# Patient Record
Sex: Female | Born: 1960
Health system: Southern US, Community
[De-identification: ages and names within clinical notes are randomized; demographics above are authoritative.]

## PROBLEM LIST (undated history)

## (undated) DIAGNOSIS — R112 Nausea with vomiting, unspecified: Secondary | ICD-10-CM

## (undated) DIAGNOSIS — E785 Hyperlipidemia, unspecified: Secondary | ICD-10-CM

## (undated) DIAGNOSIS — G4700 Insomnia, unspecified: Secondary | ICD-10-CM

## (undated) DIAGNOSIS — D649 Anemia, unspecified: Secondary | ICD-10-CM

## (undated) DIAGNOSIS — T7840XA Allergy, unspecified, initial encounter: Secondary | ICD-10-CM

## (undated) DIAGNOSIS — M1990 Unspecified osteoarthritis, unspecified site: Secondary | ICD-10-CM

## (undated) DIAGNOSIS — I341 Nonrheumatic mitral (valve) prolapse: Secondary | ICD-10-CM

## (undated) DIAGNOSIS — Z9889 Other specified postprocedural states: Secondary | ICD-10-CM

## (undated) HISTORY — DX: Allergy, unspecified, initial encounter: T78.40XA

## (undated) HISTORY — PX: BREAST BIOPSY: SHX20

## (undated) HISTORY — DX: Insomnia, unspecified: G47.00

## (undated) HISTORY — DX: Hyperlipidemia, unspecified: E78.5

## (undated) HISTORY — DX: Unspecified osteoarthritis, unspecified site: M19.90

## (undated) HISTORY — PX: EYE SURGERY: SHX253

## (undated) HISTORY — PX: AUGMENTATION MAMMAPLASTY: SUR837

## (undated) HISTORY — PX: JOINT REPLACEMENT: SHX530

---

## 1986-02-19 HISTORY — PX: KNEE SURGERY: SHX244

## 2001-05-06 ENCOUNTER — Other Ambulatory Visit: Admission: RE | Admit: 2001-05-06 | Discharge: 2001-05-06 | Payer: Self-pay | Admitting: Family Medicine

## 2002-04-16 ENCOUNTER — Encounter: Admission: RE | Admit: 2002-04-16 | Discharge: 2002-04-16 | Payer: Self-pay | Admitting: Family Medicine

## 2002-04-16 ENCOUNTER — Encounter: Payer: Self-pay | Admitting: Family Medicine

## 2003-04-19 ENCOUNTER — Encounter: Admission: RE | Admit: 2003-04-19 | Discharge: 2003-04-19 | Payer: Self-pay | Admitting: Family Medicine

## 2003-05-19 ENCOUNTER — Other Ambulatory Visit: Admission: RE | Admit: 2003-05-19 | Discharge: 2003-05-19 | Payer: Self-pay | Admitting: Family Medicine

## 2004-05-17 ENCOUNTER — Encounter: Admission: RE | Admit: 2004-05-17 | Discharge: 2004-05-17 | Payer: Self-pay | Admitting: Family Medicine

## 2004-05-18 ENCOUNTER — Other Ambulatory Visit: Admission: RE | Admit: 2004-05-18 | Discharge: 2004-05-18 | Payer: Self-pay | Admitting: Family Medicine

## 2005-05-29 ENCOUNTER — Encounter: Admission: RE | Admit: 2005-05-29 | Discharge: 2005-05-29 | Payer: Self-pay | Admitting: Family Medicine

## 2005-08-29 ENCOUNTER — Other Ambulatory Visit: Admission: RE | Admit: 2005-08-29 | Discharge: 2005-08-29 | Payer: Self-pay | Admitting: Family Medicine

## 2006-07-25 ENCOUNTER — Encounter: Admission: RE | Admit: 2006-07-25 | Discharge: 2006-07-25 | Payer: Self-pay | Admitting: Family Medicine

## 2006-09-02 ENCOUNTER — Other Ambulatory Visit: Admission: RE | Admit: 2006-09-02 | Discharge: 2006-09-02 | Payer: Self-pay | Admitting: Family Medicine

## 2007-07-29 ENCOUNTER — Encounter: Admission: RE | Admit: 2007-07-29 | Discharge: 2007-07-29 | Payer: Self-pay | Admitting: Family Medicine

## 2007-08-06 ENCOUNTER — Encounter: Admission: RE | Admit: 2007-08-06 | Discharge: 2007-08-06 | Payer: Self-pay | Admitting: Family Medicine

## 2007-09-03 ENCOUNTER — Other Ambulatory Visit: Admission: RE | Admit: 2007-09-03 | Discharge: 2007-09-03 | Payer: Self-pay | Admitting: Family Medicine

## 2008-08-06 ENCOUNTER — Encounter: Admission: RE | Admit: 2008-08-06 | Discharge: 2008-08-06 | Payer: Self-pay | Admitting: Family Medicine

## 2008-09-15 ENCOUNTER — Other Ambulatory Visit: Admission: RE | Admit: 2008-09-15 | Discharge: 2008-09-15 | Payer: Self-pay | Admitting: Family Medicine

## 2008-12-16 ENCOUNTER — Encounter: Payer: Self-pay | Admitting: Cardiovascular Disease

## 2008-12-17 ENCOUNTER — Encounter: Payer: Self-pay | Admitting: Cardiovascular Disease

## 2009-01-03 DIAGNOSIS — R030 Elevated blood-pressure reading, without diagnosis of hypertension: Secondary | ICD-10-CM | POA: Insufficient documentation

## 2009-01-03 DIAGNOSIS — B9789 Other viral agents as the cause of diseases classified elsewhere: Secondary | ICD-10-CM | POA: Insufficient documentation

## 2009-01-03 DIAGNOSIS — E7800 Pure hypercholesterolemia, unspecified: Secondary | ICD-10-CM | POA: Insufficient documentation

## 2009-01-03 DIAGNOSIS — G43909 Migraine, unspecified, not intractable, without status migrainosus: Secondary | ICD-10-CM | POA: Insufficient documentation

## 2009-01-03 DIAGNOSIS — R079 Chest pain, unspecified: Secondary | ICD-10-CM | POA: Insufficient documentation

## 2009-01-12 ENCOUNTER — Ambulatory Visit: Payer: Self-pay | Admitting: Cardiovascular Disease

## 2009-01-12 DIAGNOSIS — I10 Essential (primary) hypertension: Secondary | ICD-10-CM | POA: Insufficient documentation

## 2009-02-17 ENCOUNTER — Telehealth (INDEPENDENT_AMBULATORY_CARE_PROVIDER_SITE_OTHER): Payer: Self-pay | Admitting: *Deleted

## 2009-02-21 ENCOUNTER — Ambulatory Visit: Payer: Self-pay | Admitting: Cardiology

## 2009-02-21 ENCOUNTER — Ambulatory Visit: Payer: Self-pay

## 2009-02-21 ENCOUNTER — Ambulatory Visit (HOSPITAL_COMMUNITY): Admission: RE | Admit: 2009-02-21 | Discharge: 2009-02-21 | Payer: Self-pay | Admitting: Cardiovascular Disease

## 2009-02-21 ENCOUNTER — Encounter (HOSPITAL_COMMUNITY): Admission: RE | Admit: 2009-02-21 | Discharge: 2009-04-25 | Payer: Self-pay | Admitting: Cardiovascular Disease

## 2009-02-21 ENCOUNTER — Encounter: Payer: Self-pay | Admitting: Cardiovascular Disease

## 2009-03-03 ENCOUNTER — Ambulatory Visit: Payer: Self-pay | Admitting: Cardiovascular Disease

## 2009-03-03 DIAGNOSIS — I341 Nonrheumatic mitral (valve) prolapse: Secondary | ICD-10-CM | POA: Insufficient documentation

## 2009-03-03 DIAGNOSIS — I059 Rheumatic mitral valve disease, unspecified: Secondary | ICD-10-CM | POA: Insufficient documentation

## 2009-08-08 ENCOUNTER — Encounter: Admission: RE | Admit: 2009-08-08 | Discharge: 2009-08-08 | Payer: Self-pay | Admitting: Family Medicine

## 2009-10-17 ENCOUNTER — Other Ambulatory Visit: Admission: RE | Admit: 2009-10-17 | Discharge: 2009-10-17 | Payer: Self-pay | Admitting: Family Medicine

## 2010-02-28 ENCOUNTER — Encounter: Payer: Self-pay | Admitting: Cardiovascular Disease

## 2010-02-28 ENCOUNTER — Ambulatory Visit (HOSPITAL_COMMUNITY)
Admission: RE | Admit: 2010-02-28 | Discharge: 2010-02-28 | Payer: Self-pay | Source: Home / Self Care | Attending: Cardiovascular Disease | Admitting: Cardiovascular Disease

## 2010-02-28 ENCOUNTER — Ambulatory Visit
Admission: RE | Admit: 2010-02-28 | Discharge: 2010-02-28 | Payer: Self-pay | Source: Home / Self Care | Attending: Cardiovascular Disease | Admitting: Cardiovascular Disease

## 2010-03-07 ENCOUNTER — Telehealth: Payer: Self-pay | Admitting: Cardiovascular Disease

## 2010-03-09 ENCOUNTER — Ambulatory Visit
Admission: RE | Admit: 2010-03-09 | Discharge: 2010-03-09 | Payer: Self-pay | Source: Home / Self Care | Attending: Cardiovascular Disease | Admitting: Cardiovascular Disease

## 2010-03-09 ENCOUNTER — Encounter: Payer: Self-pay | Admitting: Cardiovascular Disease

## 2010-03-09 DIAGNOSIS — R0602 Shortness of breath: Secondary | ICD-10-CM | POA: Insufficient documentation

## 2010-03-23 NOTE — Assessment & Plan Note (Signed)
Summary: 1 yr follow up/424.0, MVP, MR/pla   Visit Type:  1 year follow up Primary Provider:  Dr Merri Brunette  CC:  shortness of breath and .  History of Present Illness: 50 yo WF with history of migraine headaches, hyperlipidemia, HTN and strong family history of CAD who is here today for follow up. I saw her one year ago  for evaluation of chest pressure. The pain was described as  substernal and occurred  at rest and with exertion.   There was no associated SOB, palpitations, diaphoresis, nausea or vomiting. She has had no pre-syncope or syncope. She does have a history of migraine headaches and there is no association of her headaches with the chest pressure or with the elevated blood pressure.  No association of chest pain with meals. She has no history of GERD.  Her echo showed normal LV function with mild MR. Her stress test showed no ischemia.   She is here today for follow up. She has continued to have SOB when climbing stairs. No chest pain. NO awareness of palpitations. No near syncope or syncope. Repeat echo last week with normal LV size and function. Mitral valve prolapse with mild to moderate regurgitation. Normal LA size.   Current Medications (verified): 1)  Voltaren 75mg  .... 1 Tab As Needed 2)  Aspirin 81 Mg Tbec (Aspirin) .... Take One Tablet By Mouth Daily 3)  Zyrtec Allergy 10 Mg Tabs (Cetirizine Hcl) .Marland Kitchen.. 1 Tab Once Daily As Needed 4)  Flonase 50 Mcg/act Susp (Fluticasone Propionate) .... As Needed 5)  Yasmin 28 3-0.03 Mg Tabs (Drospirenone-Ethinyl Estradiol) .... Take As Directed 6)  Imipramine Hcl 10 Mg Tabs (Imipramine Hcl) .Marland Kitchen.. 1 1/2 Tab Once Daily 7)  Zomig 5 Mg Tabs (Zolmitriptan) .Marland Kitchen.. 1 Tab As Needed For Migraines 8)  Lipitor 20 Mg Tabs (Atorvastatin Calcium) .Marland Kitchen.. 1 Tab Once Daily 9)  Transderm-Scop 1.5 Mg Pt72 (Scopolamine Base) .... Change Patch Every 72 Hours( Travel Only) 10)  Hydrochlorothiazide 25 Mg Tabs (Hydrochlorothiazide) .Marland Kitchen.. 1 Tablet By Mouth Once  Daily  Allergies (verified): 1)  ! Codeine  Past History:  Past Medical History: CHEST PAIN UNSPECIFIED (ICD-786.50) ELEVATED BLOOD PRESSURE (ICD-796.2) PURE HYPERCHOLESTEROLEMIA (ICD-272.0) MIGRAINE HEADACHE (ICD-346.90) MItral valve prolapse with mild to moderate MR      Social History: Reviewed history from 01/12/2009 and no changes required. Married, 1 child Tobacco Use - No.  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  The patient denies fatigue, malaise, fever, weight gain/loss, vision loss, decreased hearing, hoarseness, chest pain, palpitations, prolonged cough, wheezing, sleep apnea, coughing up blood, abdominal pain, blood in stool, nausea, vomiting, diarrhea, heartburn, incontinence, blood in urine, muscle weakness, joint pain, leg swelling, rash, skin lesions, headache, fainting, dizziness, depression, anxiety, enlarged lymph nodes, easy bruising or bleeding, and environmental allergies.    Vital Signs:  Patient profile:   50 year old female Height:      70 inches Weight:      162 pounds BMI:     23.33 Pulse rate:   88 / minute BP sitting:   108 / 70  Vitals Entered By: Caralee Ates CMA (March 09, 2010 12:18 PM)  Physical Exam  General:  General: Well developed, well nourished, NAD Psychiatric: Mood and affect normal Neck: No JVD, no carotid bruits, no thyromegaly, no lymphadenopathy. Lungs:Clear bilaterally,  no wheezes, rhonci, crackles CV: RRR no loud murmurs, gallops rubs Abdomen: soft, NT, ND, BS present Extremities: No edema, pulses 2+.    EKG  Procedure date:  03/09/2010  Findings:      NSR, rate 81 bpm.   Echocardiogram  Procedure date:  02/28/2010  Findings:      - Left ventricle: The cavity size was normal. Wall thickness was       normal. Systolic function was normal. The estimated ejection       fraction  was in the range of 60% to 65%. Wall motion was normal;       there were no regional wall motion abnormalities.     - Mitral valve: Prolapse. Mild , involving the posterior leaflet.       Mild to moderate regurgitation.     - Atrial septum: No defect or patent foramen ovale was identified.  Impression & Recommendations:  Problem # 1:  MITRAL VALVE PROLAPSE, NON-RHEUMATIC (ICD-424.0) Mild to moderate MR. I do not think this is related to her SOB. Repeat echo 2 years.   Her updated medication list for this problem includes:    Hydrochlorothiazide 25 Mg Tabs (Hydrochlorothiazide) .Marland Kitchen... 1 tablet by mouth once daily  Problem # 2:  DYSPNEA (ICD-786.05) Likely non-cardiac. WE discussed PFTs and CXR if worsens. Lung exam normal. She will let us know if her clinical status changes.   Her updated medication list for this problem includes:    Aspirin 81 Mg Tbec (Aspirin) .Marland Kitchen... Take one tablet by mouth daily    Hydrochlorothiazide 25 Mg Tabs (Hydrochlorothiazide) .Marland Kitchen... 1 tablet by mouth once daily  Patient Instructions: 1)  Your physician recommends that you schedule a follow-up appointment in: 2 years with an echocardiogram.

## 2010-03-23 NOTE — Assessment & Plan Note (Signed)
Summary: per check out/ f/u to test/sf   Visit Type:  Follow-up Primary Provider:  Dr Merri Brunette  CC:  pt states she has some of the chest pressure/discomfort.Marland Kitchenotherwise no other complaints today.  History of Present Illness: 50 yo WF with history of migraine headaches, hyperlipidemia, HTN and strong family history of CAD who is here today for follow up. I saw her last month for evaluation of chest pressure. The pain was described as  substernal and occurs at rest and with exertion. The pain does not radiate. There is no associated SOB, palpitations, diaphoresis, nausea or vomiting. She has had no pre-syncope or syncope. She does have a history of migraine headaches and there is no association of her headaches with the chest pressure or with the elevated blood pressure.  No association of chest pain with meals. She has no history of GERD. Overall, her energy level has been less with workouts and she feels fatigued after minimal exercise. No palpitations or awareness of irregularity of her heart rhythm.   Her echo showed normal LV function with mild MR. Her stress test showed no ischemia. She has had several episodes of mild chest discomfort. No other complaints.   Current Medications (verified): 1)  Voltaren 75mg  .... 1 Tab As Needed 2)  Aspirin 81 Mg Tbec (Aspirin) .... Take One Tablet By Mouth Daily 3)  Zyrtec Allergy 10 Mg Tabs (Cetirizine Hcl) .Marland Kitchen.. 1 Tab Once Daily As Needed 4)  Flonase 50 Mcg/act Susp (Fluticasone Propionate) .... As Needed 5)  Yasmin 28 3-0.03 Mg Tabs (Drospirenone-Ethinyl Estradiol) .... Take As Directed 6)  Imipramine Hcl 10 Mg Tabs (Imipramine Hcl) .Marland Kitchen.. 1 1/2 Tab Once Daily 7)  Zomig 5 Mg Tabs (Zolmitriptan) .Marland Kitchen.. 1 Tab As Needed For Migraines 8)  Lipitor 20 Mg Tabs (Atorvastatin Calcium) .Marland Kitchen.. 1 Tab Once Daily 9)  Transderm-Scop 1.5 Mg Pt72 (Scopolamine Base) .... Change Patch Every 72 Hours( Travel Only)  Allergies: 1)  ! Codeine  Past History:  Past Medical  History: Reviewed history from 01/12/2009 and no changes required. CHEST PAIN UNSPECIFIED (ICD-786.50) ELEVATED BLOOD PRESSURE (ICD-796.2) PURE HYPERCHOLESTEROLEMIA (ICD-272.0) MIGRAINE HEADACHE (ICD-346.90)      Social History: Reviewed history from 01/12/2009 and no changes required. Married, 1 children Tobacco Use - No.  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  The patient denies fatigue, malaise, fever, weight gain/loss, vision loss, decreased hearing, hoarseness, palpitations, shortness of breath, prolonged cough, wheezing, sleep apnea, coughing up blood, abdominal pain, blood in stool, nausea, vomiting, diarrhea, heartburn, incontinence, blood in urine, muscle weakness, joint pain, leg swelling, rash, skin lesions, headache, fainting, dizziness, depression, anxiety, enlarged lymph nodes, easy bruising or bleeding, and environmental allergies.    Vital Signs:  Patient profile:   50 year old female Height:      70 inches Weight:      166 pounds BMI:     23.90 Pulse rate:   72 / minute Pulse rhythm:   regular BP sitting:   102 / 80  (left arm) Cuff size:   large  Vitals Entered By: Danielle Rankin, CMA (March 03, 2009 11:53 AM)  Physical Exam  General:  General: Well developed, well nourished, NAD Psychiatric: Mood and affect normal Neck: No JVD, no carotid bruits, no thyromegaly, no lymphadenopathy. Lungs:Clear bilaterally, no wheezes, rhonci, crackles CV: RRR no murmurs, gallops  rubs Abdomen: soft, NT, ND, BS present Extremities: Pulses 2+.    Echocardiogram  Procedure date:  02/21/2009  Findings:      - Left ventricle: The cavity size was normal. Wall thickness was       normal. Systolic function was normal. The estimated ejection       fraction was in the range of 55% to 60%. Wall motion was normal;       there were no regional wall motion  abnormalities.     - Mitral valve: Mild prolapse. Mild regurgitation.  Nuclear ETT  Procedure date:  02/21/2009  Findings:      Stress Procedure   The patient exercised for 11:05.  The patient stopped due to fatigue and denied any chest pain.  There were no diagnostic ST-T wave changes, only nonspecific changes.  There was one 3-beat episode of v-tach in recovery.  Myoview was injected at peak exercise and myocardial perfusion imaging was performed after a brief delay.  Quantitative Gated Spect Images  QGS EDV:  84 ml QGS ESV:  31 ml QGS EF:  63 % QGS cine images:  Normal wall motion.  Overall Impression   Exercise Capacity: Good exercise capacity. BP Response: Normal blood pressure response. Clinical Symptoms: No chest pain ECG Impression: Insignificant upsloping ST segment depression; one 3 beat run of NSVT. Overall Impression: There is no sign of scar or ischemia.   Impression & Recommendations:  Problem # 1:  CHEST TIGHTNESS-PRESSURE-OTHER ([id number]) Non-cardiac chest pain. No further cardiac workup at this time.   Problem # 2:  MITRAL VALVE PROLAPSE, NON-RHEUMATIC (ICD-424.0) Mild MR. Repeat echo one year. No indication for dental antibiotic prophylaxis per current guidelines.   Problem # 3:  HYPERTENSION, BENIGN (ICD-401.1) We have discussed initiating therapy. She would like to discuss this with Dr. Katrinka Blazing. I would recommend starting HCTZ or Norvasc.   Her updated medication list for this problem includes:    Aspirin 81 Mg Tbec (Aspirin) .Marland Kitchen... Take one tablet by mouth daily  Patient Instructions: 1)  Your physician recommends that you schedule a follow-up appointment in: 12 months 2)  Your physician has requested that you have an echocardiogram.  Echocardiography is a painless test that uses sound waves to create images of your heart. It provides your doctor with information about the size and shape of your heart and how well your heart's chambers and valves are  working.  This procedure takes approximately one hour. There are no restrictions for this procedure. To be done in 12 months

## 2010-03-23 NOTE — Letter (Signed)
[facility]oraha [name]   Imported By: Kassie Mends 03/07/2009 13:48:39  _____________________________________________________________________  External Attachment:    Type:   Image     Comment:   External Document

## 2010-03-23 NOTE — Assessment & Plan Note (Signed)
Summary: Cardiology Nuclear Study  Nuclear Med Background Indications for Stress Test: Evaluation for Ischemia    History Comments: NO DOCUMENTED CAD  Symptoms: Chest Pressure, Chest Pressure with Exertion, Fatigue with Exertion  Symptoms Comments: Last episode of CP:3 weeks ago.   Nuclear Pre-Procedure Cardiac Risk Factors: Family History - CAD, Hypertension, Lipids Caffeine/Decaff Intake: None NPO After: 12:00 AM Lungs: Clear IV 0.9% NS with Angio Cath: 22g     IV Site: (R) AC IV Started by: Irean Hong RN Chest Size (in) 38     Cup Size A     Height (in): 70 Weight (lb): 158 BMI: 22.75 Tech Comments: Baseline HTN: Sitting 139/93, Standing 121/94.  Nuclear Med Study 1 or 2 day study:  1 day     Stress Test Type:  Stress Reading MD:  Olga Millers, MD     Referring MD:  Verne Carrow, MD; VH:QIONGEX Katrinka Blazing, MD Resting Radionuclide:  Technetium 67m Tetrofosmin     Resting Radionuclide Dose:  11.0 mCi  Stress Radionuclide:  Technetium 22m Tetrofosmin     Stress Radionuclide Dose:  33.0 mCi   Stress Protocol Exercise Time (min):  11:05 min     Max HR:  164 bpm     Predicted Max HR:  173 bpm  Max Systolic BP: 147 mm Hg     Percent Max HR:  94.80 %     METS: 13.4 Rate Pressure Product:  52841    Stress Test Technologist:  [employer] CMA-N     Nuclear Technologist:  Harlow Asa CNMT  Rest Procedure  Myocardial perfusion imaging was performed at rest 45 minutes following the intravenous administration of Myoview Technetium 7m Tetrofosmin.  Stress Procedure  The patient exercised for 11:05.  The patient stopped due to fatigue and denied any chest pain.  There were no diagnostic ST-T wave changes, only nonspecific changes.  There was one 3-beat episode of v-tach in recovery.  Myoview was injected at peak exercise and myocardial perfusion imaging was performed after a brief delay.  QPS Raw Data Images:  Acuisition technically good; normal left ventricular  size. Stress Images:  There is normal uptake in all areas. Rest Images:  Normal homogeneous uptake in all areas of the myocardium. Subtraction (SDS):  No evidence of ischemia. Transient Ischemic Dilatation:  .97  (Normal <1.22)  Lung/Heart Ratio:  .28  (Normal <0.45)  Quantitative Gated Spect Images QGS EDV:  84 ml QGS ESV:  31 ml QGS EF:  63 % QGS cine images:  Normal wall motion.   Overall Impression  Exercise Capacity: Good exercise capacity. BP Response: Normal blood pressure response. Clinical Symptoms: No chest pain ECG Impression: Insignificant upsloping ST segment depression; one 3 beat run of NSVT. Overall Impression: There is no sign of scar or ischemia.  Appended Document: Cardiology Nuclear Study Normal. No ischemia. Please see echo results too. cdm  Appended Document: Cardiology Nuclear Study Dr. Clifton James reviewed with pt. at office visit on 03/03/09

## 2010-03-23 NOTE — Progress Notes (Signed)
Summary: pt rtn call  Phone Note Call from Patient Call back at Home Phone [phone]   Caller: Patient Reason for Call: Talk to Nurse, Talk to Doctor Summary of Call: pt rtn call to [facility] Initial call taken by: Omer Jack,  March 07, 2010 4:44 PM  Follow-up for Phone Call        patient aware of test results. Whitney Maeola Sarah RN  March 07, 2010 5:10 PM  Follow-up by: Whitney Maeola Sarah RN,  March 07, 2010 5:10 PM

## 2010-06-20 ENCOUNTER — Ambulatory Visit (INDEPENDENT_AMBULATORY_CARE_PROVIDER_SITE_OTHER): Payer: 59 | Admitting: Family Medicine

## 2010-06-20 ENCOUNTER — Encounter: Payer: Self-pay | Admitting: Family Medicine

## 2010-06-20 DIAGNOSIS — M25819 Other specified joint disorders, unspecified shoulder: Secondary | ICD-10-CM

## 2010-06-20 DIAGNOSIS — M67919 Unspecified disorder of synovium and tendon, unspecified shoulder: Secondary | ICD-10-CM

## 2010-06-20 DIAGNOSIS — M7541 Impingement syndrome of right shoulder: Secondary | ICD-10-CM

## 2010-06-20 DIAGNOSIS — M7550 Bursitis of unspecified shoulder: Secondary | ICD-10-CM

## 2010-06-20 DIAGNOSIS — M7580 Other shoulder lesions, unspecified shoulder: Secondary | ICD-10-CM

## 2010-06-20 DIAGNOSIS — IMO0002 Reserved for concepts with insufficient information to code with codable children: Secondary | ICD-10-CM

## 2010-06-20 DIAGNOSIS — M75100 Unspecified rotator cuff tear or rupture of unspecified shoulder, not specified as traumatic: Secondary | ICD-10-CM

## 2010-06-20 DIAGNOSIS — M719 Bursopathy, unspecified: Secondary | ICD-10-CM

## 2010-06-20 NOTE — Progress Notes (Signed)
The patient noted above presents with shoulder pain that has been ongoing for 2 years. there is no history of trauma or accident recently The patient denies neck pain or radicular symptoms. Denies dislocation, subluxation, separation of the shoulder. The patient does complain of pain in the overhead plane with significant painful arc of motion. And pain with IROM.  Medications Tried: Tylenol, NSAIDS Ice or Heat: minimally helpful Tried PT: No  Prior shoulder Injury: No Prior surgery: No Prior fracture: No  The PMH, PSH, Social History, Family History, Medications, and allergies have been reviewed in [facility], and have been updated if relevant.  REVIEW OF SYSTEMS  GEN: No fevers, chills. Nontoxic. Primarily MSK c/o today. MSK: Detailed in the HPI GI: tolerating PO intake without difficulty Neuro: No numbness, parasthesias, or tingling associated. Otherwise the pertinent positives of the ROS are noted above.   GEN: Well-developed,well-nourished,in no acute distress; alert,appropriate and cooperative throughout examination HEENT: Normocephalic and atraumatic without obvious abnormalities. Ears, externally no deformities PULM: Breathing comfortably in no respiratory distress EXT: No clubbing, cyanosis, or edema PSYCH: Normally interactive. Cooperative during the interview. Pleasant. Friendly and conversant. Not anxious or depressed appearing. Normal, full affect.  Shoulder: R Inspection: No muscle wasting or winging Ecchymosis/edema: neg  AC joint, scapula, clavicle: NT Cervical spine: NT, full ROM Spurling's: neg Abduction: full, 4+/5 - PAINFUL ARC OF MOTION Flexion: full, 4+/5 IR, full, lift-off: 4/5 - LOSS OF 40 DEG COMPARED TO CONTRALATERAL ER at neutral: full, 4+/5 - LOSS OF 10 DEG AC crossover: neg Neer: pos Hawkins: pos Drop Test: neg Empty Can: pos Supraspinatus insertion: mild-mod T Bicipital groove: NT Speed's: pos Yergason's: pos Sulcus sign: neg Scapular  dyskinesis: none C5-T1 intact  Neuro: Sensation intact Grip 5/5    1. Rotator Cuff tendinopathy: subacromial impingement with subcoracoid impingement, subac bursitis, bicipital tendonitis. Beginings of a frozen shoulder. >30 minutes spent in face to face time with patient, >50% spent in counselling or coordination of care  Rotator cuff strengthening and scapular stabilization exercises were reviewed with the patient.  [facility] RTC and scapular stabilization program given to the patient. Retraining shoulder mechanics and function was emphasized to the patient with rehab done at least 5-6 days a week.   formal PT to assist with scapular stabilization and RTC strengthening.  Suggested subac inj, but declined.

## 2010-08-29 ENCOUNTER — Other Ambulatory Visit: Payer: Self-pay | Admitting: *Deleted

## 2010-08-29 ENCOUNTER — Other Ambulatory Visit: Payer: Self-pay | Admitting: Family Medicine

## 2010-08-29 DIAGNOSIS — Z1231 Encounter for screening mammogram for malignant neoplasm of breast: Secondary | ICD-10-CM

## 2010-08-29 DIAGNOSIS — M7541 Impingement syndrome of right shoulder: Secondary | ICD-10-CM

## 2010-08-29 NOTE — Progress Notes (Signed)
Order faxed to PT on [facility] per patient's request.

## 2010-09-13 ENCOUNTER — Ambulatory Visit: Payer: 59

## 2010-09-19 ENCOUNTER — Ambulatory Visit
Admission: RE | Admit: 2010-09-19 | Discharge: 2010-09-19 | Disposition: A | Discharge status: Home / Self Care | Payer: 59 | Source: Ambulatory Visit | Attending: Family Medicine | Admitting: Family Medicine

## 2010-09-19 DIAGNOSIS — Z1231 Encounter for screening mammogram for malignant neoplasm of breast: Secondary | ICD-10-CM

## 2010-09-25 ENCOUNTER — Ambulatory Visit: Payer: 59 | Attending: Family Medicine | Admitting: Rehabilitative and Restorative Service Providers"

## 2010-09-25 DIAGNOSIS — IMO0001 Reserved for inherently not codable concepts without codable children: Secondary | ICD-10-CM | POA: Insufficient documentation

## 2010-09-25 DIAGNOSIS — M25519 Pain in unspecified shoulder: Secondary | ICD-10-CM | POA: Insufficient documentation

## 2010-09-25 DIAGNOSIS — M2569 Stiffness of other specified joint, not elsewhere classified: Secondary | ICD-10-CM | POA: Insufficient documentation

## 2010-09-27 ENCOUNTER — Ambulatory Visit: Payer: 59 | Admitting: Rehabilitative and Restorative Service Providers"

## 2010-10-02 ENCOUNTER — Ambulatory Visit: Payer: 59 | Admitting: Physical Therapy

## 2010-10-04 ENCOUNTER — Ambulatory Visit: Payer: 59 | Admitting: Physical Therapy

## 2010-10-09 ENCOUNTER — Encounter: Payer: 59 | Admitting: Rehabilitative and Restorative Service Providers"

## 2010-10-10 ENCOUNTER — Ambulatory Visit: Payer: 59 | Admitting: Physical Therapy

## 2010-10-11 ENCOUNTER — Ambulatory Visit: Payer: 59 | Admitting: Physical Therapy

## 2010-10-11 ENCOUNTER — Encounter: Payer: 59 | Admitting: Rehabilitative and Restorative Service Providers"

## 2010-10-12 ENCOUNTER — Encounter: Payer: 59 | Admitting: Physical Therapy

## 2010-10-16 ENCOUNTER — Ambulatory Visit: Payer: 59 | Admitting: Physical Therapy

## 2010-10-19 ENCOUNTER — Encounter: Payer: 59 | Admitting: Physical Therapy

## 2010-10-25 ENCOUNTER — Ambulatory Visit: Payer: 59 | Attending: Family Medicine | Admitting: Rehabilitative and Restorative Service Providers"

## 2010-10-25 DIAGNOSIS — M2569 Stiffness of other specified joint, not elsewhere classified: Secondary | ICD-10-CM | POA: Insufficient documentation

## 2010-10-25 DIAGNOSIS — M25519 Pain in unspecified shoulder: Secondary | ICD-10-CM | POA: Insufficient documentation

## 2010-10-25 DIAGNOSIS — IMO0001 Reserved for inherently not codable concepts without codable children: Secondary | ICD-10-CM | POA: Insufficient documentation

## 2010-10-31 ENCOUNTER — Ambulatory Visit: Payer: 59 | Admitting: Physical Therapy

## 2010-11-07 ENCOUNTER — Ambulatory Visit: Payer: 59 | Admitting: Physical Therapy

## 2010-11-14 ENCOUNTER — Encounter: Payer: 59 | Admitting: Rehabilitative and Restorative Service Providers"

## 2010-11-22 ENCOUNTER — Encounter: Payer: 59 | Admitting: Rehabilitative and Restorative Service Providers"

## 2011-01-04 ENCOUNTER — Ambulatory Visit (INDEPENDENT_AMBULATORY_CARE_PROVIDER_SITE_OTHER): Payer: 59 | Admitting: Family Medicine

## 2011-01-04 DIAGNOSIS — M7580 Other shoulder lesions, unspecified shoulder: Secondary | ICD-10-CM

## 2011-01-04 DIAGNOSIS — M75100 Unspecified rotator cuff tear or rupture of unspecified shoulder, not specified as traumatic: Secondary | ICD-10-CM

## 2011-01-04 DIAGNOSIS — M25819 Other specified joint disorders, unspecified shoulder: Secondary | ICD-10-CM

## 2011-01-04 DIAGNOSIS — M7541 Impingement syndrome of right shoulder: Secondary | ICD-10-CM

## 2011-01-04 DIAGNOSIS — M7550 Bursitis of unspecified shoulder: Secondary | ICD-10-CM

## 2011-01-04 DIAGNOSIS — IMO0002 Reserved for concepts with insufficient information to code with codable children: Secondary | ICD-10-CM

## 2011-01-04 DIAGNOSIS — M67919 Unspecified disorder of synovium and tendon, unspecified shoulder: Secondary | ICD-10-CM

## 2011-01-05 ENCOUNTER — Encounter: Payer: Self-pay | Admitting: Family Medicine

## 2011-01-05 NOTE — Progress Notes (Signed)
Patient Name: Tara [name] Date of Birth: Nov 01, 1960 Age: 50 y.o. Medical Record Number: [id number] Gender: female  History of Present Illness:  Tara [name] is a 50 y.o. very pleasant female patient who presents with the following:  Very pleasant lady seen in followup for RIGHT shoulder impingement, and weakness as well as restriction of motion that I saw about 6 months ago. She would've formal physical therapy, and has been quite diligent on her home exercise program working on her rotator cuff strengthening and scapular stabilization about 5 days a week. She is functionally significantly better, range of motion is now normal, and her strength is improved to where it is normal on the affected side compared to the contralateral side. She will let some occasional rare impingement when she is lifting up something with flexion  Hi, or abduction high. Occasionally she will have a twinge and had some bad pain all of a sudden, but globally she is significantly better. No instability.  Past Medical History, Surgical History, Social History, Family History, and Problem List have been reviewed in EHR and updated if relevant.  Review of Systems:  GEN: No fevers, chills. Nontoxic. Primarily MSK c/o today. MSK: Detailed in the HPI GI: tolerating PO intake without difficulty Neuro: No numbness, parasthesias, or tingling associated. Otherwise the pertinent positives of the ROS are noted above.    Physical Examination: Filed Vitals:   01/04/11 1503  BP: 128/87     GEN: WDWN, NAD, Non-toxic, Alert & Oriented x 3 HEENT: Atraumatic, Normocephalic.  Ears and Nose: No external deformity. EXTR: No clubbing/cyanosis/edema NEURO: Normal gait.  PSYCH: Normally interactive. Conversant. Not depressed or anxious appearing.  Calm demeanor.   Shoulder: R Inspection: No muscle wasting or winging Ecchymosis/edema: neg  AC joint, scapula, clavicle: NT Cervical spine: NT, full ROM Spurling's:  neg Abduction: full, 5/5 Flexion: full, 5/5 IR, full, lift-off: 5/5 ER at neutral: full, 5/5 AC crossover and compression: neg Neer: neg Hawkins: neg Drop Test: neg Empty Can: neg Supraspinatus insertion: NT Bicipital groove: NT Speed's: neg Yergason's: neg Sulcus sign: neg Scapular dyskinesis: none Crank neg obriens neg Clunk neg Apprehension neg jobe relocation negative C5-T1 intact Sensation intact Grip 5/5  Diagnostic Ultrasound Evaluation [employer] E, MSK ultrasound, MSK probe Anatomy scanned: Right shoulder Indication: Pain Findings: bbicipital tendon appears normal in its groove without significant fluid, and there is no evidence for tearing. Subscapularis does not impinge on the subcoracoid area, but there is evidence of a hyperechoic calcification in the subscapularis consistent with a prior care and healing.  There appears to be no gapping or hypoechoic area currently. Supraspinatus is intact. There is one small area of hyperechoic calcifications seen very minimally distally in the tendon. There is no fluid in the acromioclavicular joint, and there is no significant osteoarthritis visualized. Minimal impingement of the supraspinatus in the subacromial space. No significant subacromial bursitis. Infraspinatus is intact.   Assessment and Plan: 1. Impingement syndrome of right shoulder   2. Subacromial bursitis   3. Rotator cuff tendonitis     >25 minutes spent in face to face time with patient, >50% spent in counselling or coordination of care  The patient is dramatically improved compared to my prior examination. Is very reassuring that her ultrasound appears normal. I suspect that she probably earlier in the summer did have a subscapularis partial tear, but that is healed now, and would correspond to her relative weakness and internal range of motion at her last exam. Functionally, she  appears quite good, and will have some rare impingement. Theoretically  it is possible that she could have a small slap lesion or some other  Labral pathology. I discussed all this with her, and since she is doing quite well and has 5 out of 5 strength and normal motion, we decided that the best course of action is for her to slightly modify her activities if she is having some symptoms, slightly modify her weightlifting routines if needed, but overall I tried to reassure her that her shoulder appears dramatically better compared to my initial evaluation.

## 2011-09-27 ENCOUNTER — Ambulatory Visit: Payer: 59 | Admitting: Sports Medicine

## 2011-10-03 ENCOUNTER — Encounter: Payer: Self-pay | Admitting: Family Medicine

## 2011-10-03 ENCOUNTER — Ambulatory Visit (INDEPENDENT_AMBULATORY_CARE_PROVIDER_SITE_OTHER): Payer: 59 | Admitting: Family Medicine

## 2011-10-03 VITALS — BP 125/89 | HR 63 | Ht 70.0 in | Wt 155.0 lb

## 2011-10-03 DIAGNOSIS — M25519 Pain in unspecified shoulder: Secondary | ICD-10-CM

## 2011-10-03 DIAGNOSIS — M25819 Other specified joint disorders, unspecified shoulder: Secondary | ICD-10-CM

## 2011-10-03 DIAGNOSIS — M7541 Impingement syndrome of right shoulder: Secondary | ICD-10-CM

## 2011-10-03 DIAGNOSIS — M25511 Pain in right shoulder: Secondary | ICD-10-CM

## 2011-10-03 MED ORDER — NITROGLYCERIN 0.2 MG/HR TD PT24: MEDICATED_PATCH | TRANSDERMAL | Status: DC

## 2011-10-03 NOTE — Patient Instructions (Addendum)
You have rotator cuff impingement Try to avoid painful activities (overhead activities, lifting with extended arm) as much as possible. Aleve and/or tylenol as needed for pain. Start nitro patches - place 1/4th of a patch over affected area and change every day. Subacromial injection may be beneficial to help with pain and to decrease inflammation if still not improving. Continue home exercises most days of the week. Follow up with me in 6 weeks. If not improving would consider cortisone injection, increased nitro dose.

## 2011-10-05 ENCOUNTER — Encounter: Payer: Self-pay | Admitting: Family Medicine

## 2011-10-05 NOTE — Progress Notes (Signed)
  Subjective:    Patient ID: Tara [name], female    DOB: 1960-05-07, 51 y.o.   MRN: [id number]  PCP: Dr. Katrinka Blazing  HPI 51 yo F here for right shoulder pain.  Patient was seen in [place] office most recently in November for right rotator cuff impingement. She has done formal PT and been diligent about her home exercises. Continues to struggle with 4/10 pain with right shoulder motions. Intermittent swelling. ROM limited with the pain. No new injuries. Worse when lying on right shoulder. Worse with reaching and overhead activities. Better with massage. Some numbness and tingling into right arm.  History reviewed. No pertinent past medical history.  Current Outpatient Prescriptions on File Prior to Visit  Medication Sig Dispense Refill  . nitroGLYCERIN (NITRODUR - DOSED IN MG/24 HR) 0.2 mg/hr 1/4 patch to affected area of right shoulder and change daily.  30 patch  1    Past Surgical History  Procedure Date  . Knee surgery 1988    right    Allergies  Allergen Reactions  . Codeine   . Hydrocodone     History   Social History  . Marital Status: Married    Spouse Name: N/A    Number of Children: N/A  . Years of Education: N/A   Occupational History  . Not on file.   Social History Main Topics  . Smoking status: Never Smoker   . Smokeless tobacco: Never Used  . Alcohol Use: Not on file  . Drug Use: Not on file  . Sexually Active: Not on file   Other Topics Concern  . Not on file   Social History Narrative  . No narrative on file    Family History  Problem Relation Age of Onset  . Sudden death Neg Hx   . Hypertension Neg Hx   . Hyperlipidemia Neg Hx   . Heart attack Neg Hx   . Diabetes Neg Hx     BP 125/89  Pulse 63  Ht 5\' 10"  (1.778 m)  Wt 155 lb (70.308 kg)  BMI 22.24 kg/m2  Review of Systems See HPI above.    Objective:   Physical Exam Gen: NAD  Neck: No gross deformity, swelling, bruising. No paraspinal TTP .  No midline/bony  TTP. FROM neck without pain . BUE strength 5/5 except rotator cuff.   Sensation intact to light touch. Negative spurlings. NV intact distal BUEs.  R shoulder: No swelling, ecchymoses.  No gross deformity. No TTP. FROM with painful arc. Positive Hawkins, Neers. Negative Speeds, Yergasons. Strength 5/5 with empty can and resisted internal/external rotation.  Pain with empty can. Negative apprehension. NV intact distally.  L shoulder: FROM without pain or weakness.    Assessment & Plan:  1. Right shoulder pain - consistent with rotator cuff impingement with peripheral nerve irritation causing numbness/tingling into upper extremity - neck exam normal.  Continue HEP - offered repeat PT but she declined (has done extensively for this).  Start nitro patches.  Declined subacromial injection.  F/u in 6 weeks - if not improving consider increased nitro dose, injection, c-spine workup depending on her exam.

## 2011-10-08 NOTE — Assessment & Plan Note (Signed)
consistent with rotator cuff impingement with peripheral nerve irritation causing numbness/tingling into upper extremity - neck exam normal.  Continue HEP - offered repeat PT but she declined (has done extensively for this).  Start nitro patches.  Declined subacromial injection.  F/u in 6 weeks - if not improving consider increased nitro dose, injection, c-spine workup depending on her exam.

## 2011-10-23 ENCOUNTER — Other Ambulatory Visit: Payer: Self-pay | Admitting: Family Medicine

## 2011-10-23 DIAGNOSIS — Z1231 Encounter for screening mammogram for malignant neoplasm of breast: Secondary | ICD-10-CM

## 2011-11-06 ENCOUNTER — Ambulatory Visit
Admission: RE | Admit: 2011-11-06 | Discharge: 2011-11-06 | Disposition: A | Discharge status: Home / Self Care | Payer: 59 | Source: Ambulatory Visit | Attending: Family Medicine | Admitting: Family Medicine

## 2011-11-06 DIAGNOSIS — Z1231 Encounter for screening mammogram for malignant neoplasm of breast: Secondary | ICD-10-CM

## 2011-11-16 ENCOUNTER — Ambulatory Visit (INDEPENDENT_AMBULATORY_CARE_PROVIDER_SITE_OTHER): Payer: 59 | Admitting: Family Medicine

## 2011-11-16 ENCOUNTER — Ambulatory Visit (HOSPITAL_COMMUNITY)
Admission: RE | Admit: 2011-11-16 | Discharge: 2011-11-16 | Disposition: A | Discharge status: Home / Self Care | Payer: 59 | Source: Ambulatory Visit | Attending: Family Medicine | Admitting: Family Medicine

## 2011-11-16 ENCOUNTER — Encounter: Payer: Self-pay | Admitting: Family Medicine

## 2011-11-16 VITALS — BP 124/89 | Ht 70.0 in | Wt 160.0 lb

## 2011-11-16 DIAGNOSIS — M25819 Other specified joint disorders, unspecified shoulder: Secondary | ICD-10-CM

## 2011-11-16 DIAGNOSIS — M25511 Pain in right shoulder: Secondary | ICD-10-CM

## 2011-11-16 DIAGNOSIS — M25519 Pain in unspecified shoulder: Secondary | ICD-10-CM | POA: Insufficient documentation

## 2011-11-16 DIAGNOSIS — M19019 Primary osteoarthritis, unspecified shoulder: Secondary | ICD-10-CM | POA: Insufficient documentation

## 2011-11-16 DIAGNOSIS — M7541 Impingement syndrome of right shoulder: Secondary | ICD-10-CM

## 2011-11-16 NOTE — Progress Notes (Addendum)
Subjective:    Patient ID: Tara [name], female    DOB: September 18, 1960, 51 y.o.   MRN: [id number]  PCP: Dr. Katrinka Blazing  Shoulder Pain    51 yo F here for f/u right shoulder pain.  8/14: Patient was seen in [place] office most recently in November for right rotator cuff impingement. She has done formal PT and been diligent about her home exercises. Continues to struggle with 4/10 pain with right shoulder motions. Intermittent swelling. ROM limited with the pain. No new injuries. Worse when lying on right shoulder. Worse with reaching and overhead activities. Better with massage. Some numbness and tingling into right arm.  9/27: Patient continues doing home exercise program. Tried nitro patches for a month despite making her nauseous - no benefit to shoulder pain. Still catches occasionally causing excruciating pain at times. Other times just a mild ache. Worse with overhead acitvities, lying on this shoulder. Seeing a chiropractor - no longer has numbness/tingling or neck pain.  History reviewed. No pertinent past medical history.  Current Outpatient Prescriptions on File Prior to Visit  Medication Sig Dispense Refill  . nitroGLYCERIN (NITRODUR - DOSED IN MG/24 HR) 0.2 mg/hr 1/4 patch to affected area of right shoulder and change daily.  30 patch  1    Past Surgical History  Procedure Date  . Knee surgery 1988    right    Allergies  Allergen Reactions  . Codeine   . Hydrocodone     History   Social History  . Marital Status: Married    Spouse Name: N/A    Number of Children: N/A  . Years of Education: N/A   Occupational History  . Not on file.   Social History Main Topics  . Smoking status: Never Smoker   . Smokeless tobacco: Never Used  . Alcohol Use: Not on file  . Drug Use: Not on file  . Sexually Active: Not on file   Other Topics Concern  . Not on file   Social History Narrative  . No narrative on file    Family History  Problem Relation  Age of Onset  . Sudden death Neg Hx   . Hypertension Neg Hx   . Hyperlipidemia Neg Hx   . Heart attack Neg Hx   . Diabetes Neg Hx     BP 124/89  Ht 5\' 10"  (1.778 m)  Wt 160 lb (72.576 kg)  BMI 22.96 kg/m2  Review of Systems  See HPI above.    Objective:   Physical Exam  Gen: NAD  R shoulder: No swelling, ecchymoses.  No gross deformity. No TTP. FROM with painful arc. Negative Hawkins, Neers. Negative Speeds, Yergasons. Strength 5/5 with empty can and resisted internal/external rotation.  Pain with empty can. Negative apprehension. NV intact distally.  L shoulder: FROM without pain or weakness.    Assessment & Plan:  1. Right shoulder pain - consistent with rotator cuff impingement.  Declined subacromial injection.  Not tolerating nitro patches and they have not provided her with relief so will discontinue completely.  Continue HEP.  Will go ahead with MRI shoulder to further assess for possible rotator cuff tear or other pathology to account for persistent pain despite conservative care.  Addendum:  MRI results reviewed and discussed with patient.  Believe her symptoms are mostly coming from her labral tear and large paralabral cyst.  She does have underlying glenohumeral DJD advanced for age as well as apparent subluxation on MRI (has never had dislocation, subluxation injury  in past) though latter may be because of displacement from large cyst.  Lymph nodes are < 1 cm and patient gets mammogram yearly, no prior issues.  Would continue with yearly screening.  Will refer for consideration of arthroscopy.

## 2011-11-16 NOTE — Assessment & Plan Note (Signed)
consistent with rotator cuff impingement.  Declined subacromial injection.  Not tolerating nitro patches and they have not provided her with relief so will discontinue completely.  Continue HEP.  Will go ahead with MRI shoulder to further assess for possible rotator cuff tear or other pathology to account for persistent pain despite conservative care.

## 2011-11-20 NOTE — Addendum Note (Signed)
Addended by: Lenda Kelp on: 11/20/2011 08:48 AM   Modules accepted: Orders

## 2012-04-05 ENCOUNTER — Other Ambulatory Visit: Payer: Self-pay

## 2012-08-01 ENCOUNTER — Other Ambulatory Visit (HOSPITAL_COMMUNITY): Payer: 59

## 2012-08-25 ENCOUNTER — Encounter (HOSPITAL_COMMUNITY)
Admission: RE | Admit: 2012-08-25 | Discharge: 2012-08-25 | Disposition: A | Discharge status: Home / Self Care | Payer: 59 | Source: Ambulatory Visit | Attending: Orthopedic Surgery | Admitting: Orthopedic Surgery

## 2012-08-25 ENCOUNTER — Encounter (HOSPITAL_COMMUNITY): Payer: Self-pay

## 2012-08-25 HISTORY — DX: Nonrheumatic mitral (valve) prolapse: I34.1

## 2012-08-25 LAB — HCG, SERUM, QUALITATIVE: Preg, Serum: NEGATIVE

## 2012-08-25 LAB — BASIC METABOLIC PANEL
GFR calc Af Amer: >90 mL/min (ref >90)
GFR calc non Af Amer: >90 mL/min (ref >90)
Potassium: 4.3 mEq/L (ref 3.5–5.1)
Sodium: 137 mEq/L (ref 135–145)

## 2012-08-25 LAB — CBC
Hemoglobin: 14.5 g/dL (ref 12.0–15.0)
RBC: 4.48 MIL/uL (ref 3.87–5.11)

## 2012-08-25 NOTE — Pre-Procedure Instructions (Signed)
GWENDOLA HORNADAY  08/25/2012   Your procedure is scheduled on:  Thursday August 28, 2012  Report to [facility] at 0830 AM.  Call this number if you have problems the morning of surgery: [phone]   Remember:   Do not eat food or drink liquids after midnight.   Take these medicines the morning of surgery with A SIP OF WATER: None   Do not wear jewelry, make-up or nail polish.  Do not wear lotions, powders, or perfumes. You may wear deodorant.  Do not shave 48 hours prior to surgery.   Do not bring valuables to the hospital.  [facility] is not responsible                   for any belongings or valuables.  Contacts, dentures or bridgework may not be worn into surgery.  Leave suitcase in the car. After surgery it may be brought to your room.  For patients admitted to the hospital, checkout time is 11:00 AM the day of  discharge.   Patients discharged the day of surgery will not be allowed to drive  home.  Name and phone number of your driver: Spouse  Special Instructions: Incentive Spirometry - Practice and bring it with you on the day of surgery. Shower using CHG 2 nights before surgery and the night before surgery.  If you shower the day of surgery use CHG.  Use special wash - you have one bottle of CHG for all showers.  You should use approximately 1/3 of the bottle for each shower.   Please read over the following fact sheets that you were given: Pain Booklet, Coughing and Deep Breathing, MRSA Information and Surgical Site Infection Prevention

## 2012-08-26 NOTE — Progress Notes (Signed)
Anesthesia chart review:  Patient is a 52 year old female scheduled for right shoulder arthroscopy with labral debridement versus repair and aspiration and decompression of paralabral cyst on 08/28/12 by Dr. Rennis Chris.  History includes MVP with mild to moderate MR '12, non-smoker, prior knee surgery. PCP is listed as Dr. Merri Brunette.  She saw cardiologist Dr. Clifton James in 2011/2012 for evaluation of chest pain/SOB and had a negative stress test.  At that time, he did not think her MVP was related to her SOB.  EKG on 08/25/12 showed NSR, septal infarct (age undetermined).  Overall, I think it appears stable when compared to prior EKG in Epic from 2010 and 2007.  Echocardiogram on 02/28/10 showed: - Left ventricle: The cavity size was normal. Wall thickness was normal. Systolic function was normal. The estimated ejection fraction was in the range of 60% to 65%. Wall motion was normal; there were no regional wall motion abnormalities. - Mitral valve: Prolapse. Mild , involving the posterior leaflet. Mild to moderate regurgitation. - Atrial septum: No defect or patent foramen ovale was identified. - Tricuspid valve: Trivial regurgitation.  She had a normal nuclear stress test without ischemia, EF 63% on 02/21/09.  Preoperative CXR and labs noted.  Patient will be evaluated by her assigned anesthesiologist on the day of surgery, but if no acute CV/CHF symptoms then I would anticipate that she could proceed as planned.  Velna Ochs [facility]/Anesthesiology Phone [phone] 08/26/2012 9:32 AM

## 2012-08-27 MED ORDER — CEFAZOLIN SODIUM 10 G IJ SOLR
3.0000 g | INTRAMUSCULAR | Status: AC
  Administered 2012-08-28: 3 g via INTRAVENOUS
  Filled 2012-08-27: qty 3000

## 2012-08-27 NOTE — Progress Notes (Signed)
Pt. Notified of time change for surgery, Instructed to arrive  @ 5:30 AM. Pt. Voices understanding.

## 2012-08-28 ENCOUNTER — Ambulatory Visit (HOSPITAL_COMMUNITY)
Admission: RE | Admit: 2012-08-28 | Discharge: 2012-08-28 | Disposition: A | Discharge status: Home / Self Care | Payer: 59 | Source: Ambulatory Visit | Attending: Orthopedic Surgery | Admitting: Orthopedic Surgery

## 2012-08-28 ENCOUNTER — Ambulatory Visit (HOSPITAL_COMMUNITY): Payer: 59 | Admitting: Anesthesiology

## 2012-08-28 ENCOUNTER — Encounter (HOSPITAL_COMMUNITY)
Admission: RE | Disposition: A | Discharge status: Home / Self Care | Payer: Self-pay | Source: Ambulatory Visit | Attending: Orthopedic Surgery

## 2012-08-28 ENCOUNTER — Encounter (HOSPITAL_COMMUNITY): Payer: Self-pay | Admitting: Vascular Surgery

## 2012-08-28 ENCOUNTER — Encounter (HOSPITAL_COMMUNITY): Payer: Self-pay | Admitting: *Deleted

## 2012-08-28 DIAGNOSIS — Z79899 Other long term (current) drug therapy: Secondary | ICD-10-CM | POA: Insufficient documentation

## 2012-08-28 DIAGNOSIS — M6740 Ganglion, unspecified site: Secondary | ICD-10-CM | POA: Insufficient documentation

## 2012-08-28 DIAGNOSIS — I059 Rheumatic mitral valve disease, unspecified: Secondary | ICD-10-CM | POA: Insufficient documentation

## 2012-08-28 DIAGNOSIS — Z885 Allergy status to narcotic agent status: Secondary | ICD-10-CM | POA: Insufficient documentation

## 2012-08-28 DIAGNOSIS — M9420 Chondromalacia, unspecified site: Secondary | ICD-10-CM | POA: Insufficient documentation

## 2012-08-28 DIAGNOSIS — M249 Joint derangement, unspecified: Secondary | ICD-10-CM | POA: Insufficient documentation

## 2012-08-28 DIAGNOSIS — Z7982 Long term (current) use of aspirin: Secondary | ICD-10-CM | POA: Insufficient documentation

## 2012-08-28 DIAGNOSIS — I10 Essential (primary) hypertension: Secondary | ICD-10-CM | POA: Insufficient documentation

## 2012-08-28 DIAGNOSIS — G709 Myoneural disorder, unspecified: Secondary | ICD-10-CM | POA: Insufficient documentation

## 2012-08-28 HISTORY — PX: SHOULDER ARTHROSCOPY WITH LABRAL REPAIR: SHX5691

## 2012-08-28 SURGERY — ARTHROSCOPY, SHOULDER, WITH GLENOID LABRUM REPAIR
Anesthesia: General | Site: Shoulder | Laterality: Right | Wound class: Clean

## 2012-08-28 MED ORDER — HYDROMORPHONE HCL PF 1 MG/ML IJ SOLN
INTRAMUSCULAR | Status: AC
  Filled 2012-08-28: qty 1

## 2012-08-28 MED ORDER — FENTANYL CITRATE 0.05 MG/ML IJ SOLN
INTRAMUSCULAR | Status: DC | PRN
  Administered 2012-08-28 (×2): 50 ug via INTRAVENOUS

## 2012-08-28 MED ORDER — DEXAMETHASONE SODIUM PHOSPHATE 4 MG/ML IJ SOLN
INTRAMUSCULAR | Status: DC | PRN
  Administered 2012-08-28: 8 mg via INTRAVENOUS

## 2012-08-28 MED ORDER — SCOPOLAMINE 1 MG/3DAYS TD PT72
MEDICATED_PATCH | TRANSDERMAL | Status: AC
  Filled 2012-08-28: qty 1

## 2012-08-28 MED ORDER — MIDAZOLAM HCL 5 MG/5ML IJ SOLN
INTRAMUSCULAR | Status: DC | PRN
  Administered 2012-08-28: 1 mg via INTRAVENOUS

## 2012-08-28 MED ORDER — LIDOCAINE HCL 4 % MT SOLN
OROMUCOSAL | Status: DC | PRN
  Administered 2012-08-28: 4 mL via TOPICAL

## 2012-08-28 MED ORDER — EPHEDRINE SULFATE 50 MG/ML IJ SOLN
INTRAMUSCULAR | Status: DC | PRN
  Administered 2012-08-28 (×5): 5 mg via INTRAVENOUS

## 2012-08-28 MED ORDER — NAPROXEN 500 MG PO TABS: 500.0000 mg | ORAL_TABLET | Freq: Two times a day (BID) | ORAL | Status: DC

## 2012-08-28 MED ORDER — ONDANSETRON HCL 4 MG/2ML IJ SOLN
INTRAMUSCULAR | Status: AC
  Administered 2012-08-28: 4 mg
  Filled 2012-08-28: qty 2

## 2012-08-28 MED ORDER — TEMAZEPAM 30 MG PO CAPS: 30.0000 mg | ORAL_CAPSULE | Freq: Every evening | ORAL | Status: DC | PRN

## 2012-08-28 MED ORDER — HYDROMORPHONE HCL PF 1 MG/ML IJ SOLN
0.2500 mg | INTRAMUSCULAR | Status: DC | PRN
  Administered 2012-08-28: 0.25 mg via INTRAVENOUS
  Administered 2012-08-28: 0.5 mg via INTRAVENOUS

## 2012-08-28 MED ORDER — SODIUM CHLORIDE 0.9 % IR SOLN
Status: DC | PRN
  Administered 2012-08-28: 6000 mL

## 2012-08-28 MED ORDER — ROCURONIUM BROMIDE 100 MG/10ML IV SOLN
INTRAVENOUS | Status: DC | PRN
  Administered 2012-08-28: 25 mg via INTRAVENOUS
  Administered 2012-08-28: 10 mg via INTRAVENOUS

## 2012-08-28 MED ORDER — PROPOFOL 10 MG/ML IV BOLUS
INTRAVENOUS | Status: DC | PRN
  Administered 2012-08-28: 150 mg via INTRAVENOUS

## 2012-08-28 MED ORDER — ONDANSETRON HCL 4 MG/2ML IJ SOLN: 4.0000 mg | Freq: Once | INTRAMUSCULAR | Status: DC | PRN

## 2012-08-28 MED ORDER — CHLORHEXIDINE GLUCONATE 4 % EX LIQD: 60.0000 mL | Freq: Once | CUTANEOUS | Status: DC

## 2012-08-28 MED ORDER — HYDROMORPHONE HCL 2 MG PO TABS: 2.0000 mg | ORAL_TABLET | ORAL | Status: DC | PRN

## 2012-08-28 MED ORDER — ONDANSETRON HCL 4 MG/2ML IJ SOLN
INTRAMUSCULAR | Status: DC | PRN
  Administered 2012-08-28 (×2): 4 mg via INTRAVENOUS

## 2012-08-28 MED ORDER — LIDOCAINE HCL (CARDIAC) 20 MG/ML IV SOLN
INTRAVENOUS | Status: DC | PRN
  Administered 2012-08-28: 60 mg via INTRAVENOUS

## 2012-08-28 MED ORDER — GLYCOPYRROLATE 0.2 MG/ML IJ SOLN
INTRAMUSCULAR | Status: DC | PRN
  Administered 2012-08-28: 0.4 mg via INTRAVENOUS

## 2012-08-28 MED ORDER — LACTATED RINGERS IV SOLN
INTRAVENOUS | Status: DC | PRN
  Administered 2012-08-28 (×2): via INTRAVENOUS

## 2012-08-28 MED ORDER — SCOPOLAMINE 1 MG/3DAYS TD PT72
MEDICATED_PATCH | TRANSDERMAL | Status: DC | PRN
  Administered 2012-08-28: 1 via TRANSDERMAL

## 2012-08-28 MED ORDER — NEOSTIGMINE METHYLSULFATE 1 MG/ML IJ SOLN
INTRAMUSCULAR | Status: DC | PRN
  Administered 2012-08-28: 3 mg via INTRAVENOUS

## 2012-08-28 MED ORDER — CYCLOBENZAPRINE HCL 10 MG PO TABS: 10.0000 mg | ORAL_TABLET | Freq: Three times a day (TID) | ORAL | Status: DC | PRN

## 2012-08-28 MED ORDER — LACTATED RINGERS IV SOLN: INTRAVENOUS | Status: DC

## 2012-08-28 SURGICAL SUPPLY — 56 items
BLADE CUTTER GATOR 3.5 (BLADE) ×2 IMPLANT
BLADE GREAT WHITE 4.2 (BLADE) ×2 IMPLANT
BLADE SURG 11 STRL SS (BLADE) ×2 IMPLANT
BOOTCOVER CLEANROOM LRG (PROTECTIVE WEAR) ×4 IMPLANT
BUR OVAL 4.0 (BURR) ×2 IMPLANT
CANISTER SUCT LVC 12 LTR MEDI- (MISCELLANEOUS) ×2 IMPLANT
CANNULA ACUFLEX KIT 5X76 (CANNULA) ×2 IMPLANT
CANNULA DRILOCK 5.0X75 (CANNULA) ×3 IMPLANT
CLOTH BEACON ORANGE TIMEOUT ST (SAFETY) ×2 IMPLANT
CONNECTOR 5 IN 1 STRAIGHT STRL (MISCELLANEOUS) ×1 IMPLANT
DRAPE INCISE 23X17 IOBAN STRL (DRAPES) ×1
DRAPE INCISE 23X17 STRL (DRAPES) ×1 IMPLANT
DRAPE INCISE IOBAN 23X17 STRL (DRAPES) ×1 IMPLANT
DRAPE STERI 35X30 U-POUCH (DRAPES) ×2 IMPLANT
DRAPE SURG 17X11 SM STRL (DRAPES) ×2 IMPLANT
DRAPE U-SHAPE 47X51 STRL (DRAPES) ×2 IMPLANT
DRSG PAD ABDOMINAL 8X10 ST (GAUZE/BANDAGES/DRESSINGS) ×3 IMPLANT
DURAPREP 26ML APPLICATOR (WOUND CARE) ×2 IMPLANT
ELECT REM PT RETURN 9FT ADLT (ELECTROSURGICAL) ×2
ELECTRODE REM PT RTRN 9FT ADLT (ELECTROSURGICAL) ×1 IMPLANT
GLOVE BIO SURGEON STRL SZ7.5 (GLOVE) ×2 IMPLANT
GLOVE BIO SURGEON STRL SZ8 (GLOVE) ×2 IMPLANT
GLOVE EUDERMIC 7 POWDERFREE (GLOVE) ×2 IMPLANT
GLOVE SS BIOGEL STRL SZ 7.5 (GLOVE) ×1 IMPLANT
GLOVE SUPERSENSE BIOGEL SZ 7.5 (GLOVE) ×1
GOWN STRL NON-REIN LRG LVL3 (GOWN DISPOSABLE) ×2 IMPLANT
GOWN STRL REIN XL XLG (GOWN DISPOSABLE) ×4 IMPLANT
JUGGERKNOT SHORT SET 1.4MM (KITS) IMPLANT
KIT BASIN OR (CUSTOM PROCEDURE TRAY) ×2 IMPLANT
KIT ROOM TURNOVER OR (KITS) ×2 IMPLANT
KIT SHOULDER TRACTION (DRAPES) ×2 IMPLANT
MANIFOLD NEPTUNE II (INSTRUMENTS) ×2 IMPLANT
NDL SPNL 18GX3.5 QUINCKE PK (NEEDLE) ×1 IMPLANT
NDL SUT 6 .5 CRC .975X.05 MAYO (NEEDLE) IMPLANT
NEEDLE MAYO TAPER (NEEDLE)
NEEDLE SPNL 18GX3.5 QUINCKE PK (NEEDLE) ×2 IMPLANT
NS IRRIG 1000ML POUR BTL (IV SOLUTION) ×2 IMPLANT
PACK SHOULDER (CUSTOM PROCEDURE TRAY) ×2 IMPLANT
PAD ARMBOARD 7.5X6 YLW CONV (MISCELLANEOUS) ×4 IMPLANT
SET ARTHROSCOPY TUBING (MISCELLANEOUS) ×2
SET ARTHROSCOPY TUBING LN (MISCELLANEOUS) ×1 IMPLANT
SLING ARM IMMOBILIZER LRG (SOFTGOODS) ×2 IMPLANT
SLING ARM IMMOBILIZER MED (SOFTGOODS) IMPLANT
SPONGE GAUZE 4X4 12PLY (GAUZE/BANDAGES/DRESSINGS) ×2 IMPLANT
SPONGE LAP 4X18 X RAY DECT (DISPOSABLE) ×2 IMPLANT
STRIP CLOSURE SKIN 1/2X4 (GAUZE/BANDAGES/DRESSINGS) ×2 IMPLANT
SUT MNCRL AB 3-0 PS2 18 (SUTURE) ×2 IMPLANT
SUT PDS AB 1 CT  36 (SUTURE)
SUT PDS AB 1 CT 36 (SUTURE) IMPLANT
SYR 20CC LL (SYRINGE) ×2 IMPLANT
TAPE PAPER 2X10 WHT MICROPORE (GAUZE/BANDAGES/DRESSINGS) ×1 IMPLANT
TAPE PAPER 3X10 WHT MICROPORE (GAUZE/BANDAGES/DRESSINGS) ×2 IMPLANT
TOWEL OR 17X24 6PK STRL BLUE (TOWEL DISPOSABLE) ×2 IMPLANT
TOWEL OR 17X26 10 PK STRL BLUE (TOWEL DISPOSABLE) ×2 IMPLANT
WAND SUCTION MAX 4MM 90S (SURGICAL WAND) ×2 IMPLANT
WATER STERILE IRR 1000ML POUR (IV SOLUTION) ×2 IMPLANT

## 2012-08-28 NOTE — H&P (Signed)
[place] [facility]    Chief Complaint: Right Shoulder Labral tear with Paralabral Cyst HPI: The patient is a 52 y.o. female with chronic right shoulder pain and large paralabral cyst  Past Medical History  Diagnosis Date  . Mitral valve prolapse     Past Surgical History  Procedure Laterality Date  . Knee surgery  1988    right    Family History  Problem Relation Age of Onset  . Sudden death Neg Hx   . Hypertension Neg Hx   . Hyperlipidemia Neg Hx   . Heart attack Neg Hx   . Diabetes Neg Hx     Social History:  reports that she has never smoked. She has never used smokeless tobacco. She reports that she does not drink alcohol or use illicit drugs.  Allergies:  Allergies  Allergen Reactions  . Codeine   . Hydrocodone     Medications Prior to Admission  Medication Sig Dispense Refill  . aspirin 81 MG tablet Take 81 mg by mouth every Monday, Wednesday, and Friday.      Marland Kitchen atorvastatin (LIPITOR) 20 MG tablet Take 20 mg by mouth every Monday, Wednesday, and Friday.      . drospirenone-ethinyl estradiol (OCELLA) 3-0.03 MG tablet Take 1 tablet by mouth daily.      . Multiple Vitamin (MULTIVITAMIN WITH MINERALS) TABS Take 1 tablet by mouth daily.         Physical Exam: right shoulder with painful motion and findings as noted at recent office visit  Vitals  Temp:  [98.2 F (36.8 C)] 98.2 F (36.8 C) (07/10 0549) Pulse Rate:  [78] 78 (07/10 0549) Resp:  [18] 18 (07/10 0549) BP: (105)/(78) 105/78 mmHg (07/10 0549) SpO2:  [100 %] 100 % (07/10 0549)  Assessment/Plan  Impression: Right Shoulder Labral tear with Paralabral Cyst  Plan of Action: Procedure(s): RIGHT SHOULDER ARTHROSCOPY WITH LABRAL DEBRIDEMENT VERSES REPAIR, ASPIRATION AND DECOMPRESSION OF PARALABRAL CYST  Belenda Alviar M 08/28/2012, 7:44 AM

## 2012-08-28 NOTE — Anesthesia Postprocedure Evaluation (Signed)
  Anesthesia Post-op Note  Patient: Tara [name]  Procedure(s) Performed: Procedure(s): RIGHT SHOULDER ARTHROSCOPY WITH LABRAL DEBRIDEMENT, ASPIRATION AND DECOMPRESSION OF PARALABRAL CYST (Right)  Patient Location: PACU  Anesthesia Type:GA combined with regional for post-op pain  Level of Consciousness: awake, oriented, sedated and patient cooperative  Airway and Oxygen Therapy: Patient Spontanous Breathing  Post-op Pain: mild  Post-op Assessment: Post-op Vital signs reviewed, Patient's Cardiovascular Status Stable, Respiratory Function Stable, Patent Airway, No signs of Nausea or vomiting and Pain level controlled  Post-op Vital Signs: stable  Complications: No apparent anesthesia complications

## 2012-08-28 NOTE — Op Note (Signed)
[name], [name] NO.:  [id number]  MEDICAL RECORD NO.:  [id number]  LOCATION:  [facility]                         FACILITY:  [facility]  PHYSICIAN:  Vania Rea. Dwayne Bulkley, M.D.  DATE OF BIRTH:  1961/01/05  DATE OF PROCEDURE:  08/28/2012 DATE OF DISCHARGE:  08/28/2012                              OPERATIVE REPORT   PREOPERATIVE DIAGNOSIS:  Chronic right shoulder pain with MR evidence for an extensive paralabral cyst as well as labral tear.  POSTOPERATIVE DIAGNOSES: 1. 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 2. Early glenohumeral arthrosis with advanced chondromalacia of the     humeral head, and full-thickness chondral loss on the posterior-     superior third of the glenoid. 3. Mild anterior glenohumeral laxity.  PROCEDURE: 1. Right shoulder examination under anesthesia with right shoulder     glenohumeral joint diagnostic arthroscopy. 2. 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 3. Evacuation of extensive posterior and superior paralabral cyst. 4. Chondroplasty of the humeral head and glenoid.  SURGEON:  Vania Rea. Shaneice Barsanti, M.D.  Threasa HeadsFrench Ana A. Shuford, PA-C  ANESTHESIA:  General endotracheal as well as an interscalene block.  ESTIMATED BLOOD LOSS:  Minimal.  DRAINS:  None.  HISTORY:  [name] is a 52 year old female who has had chronic and progressive increasing right shoulder pain with an examination showing good mobility and strength with questionable instability pattern.  Her symptoms have been refractory to prolonged attempts at conservative management.  An MRI scan did show evidence for complex labral tear in a very extensive paralabral cyst.  The EMG did not show evidence for compression of the suprascapular nerve.  She is brought to the operating room at  this time for planned right shoulder arthroscopy with labral debridement versus repair as well as evacuation of paralabral cyst.  Preoperatively, I counseled [name] on treatment options as well as risks versus benefits thereof.  Possible surgical complications were reviewed with potential for bleeding, infection, neurovascular injury, persistent pain, loss of motion, anesthetic complication, and possible need for additional surgery.  She understands and accepts and agrees to the planned procedure.  PROCEDURE IN DETAIL:  After undergoing routine preop evaluation, the patient received prophylactic antibiotics and an interscalene block was established in the holding area by the Anesthesia Department.  Placed supine on the operating table, underwent smooth induction of a general endotracheal anesthesia.  Turned to left lateral decubitus position on a beanbag and appropriately padded and protected.  Right shoulder examination under anesthesia revealed full motion and she had evidence for moderate to anterior-inferior glenohumeral laxity/instability.  No obvious posterior instability.  The right arm was then suspended to 70 degrees of abduction with 10 pounds of traction.  The right shoulder girdle region was sterilely prepped and draped in standard fashion. Time-out was called.  A posterior portal established under glenohumeral joint direct entry, portal established under direct visualization.  On initial evaluation, we found evidence that the glenohumeral joint volume was at the upper limits of  normal and she certainly had some apparent hyperlaxity, although surprisingly for direction instability appeared to be predominantly anteroinferiorly.  On further inspection, we found that she had an anomalous posterior superior labrum which extended almost halfway across the face of the glenoid and had a contour comparable to a "discoid" labrum.  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  Clearly, this very large anomalous piece of labral tissue overlaid the region of the labral tear noted on the MRI scan and was confluent than with the associated paralabral cyst. Given these findings, we excised the anomalous part of labral tissue back to the glenoid margin and beneath this labral tissue, we found that the underlying articular cartilage had been completely denuded with exposed subchondral bone, and this accounted for a quarter to a third of the posterior superior margin of the glenoid.  Once the labrum had been trimmed back to its glenoid margin, we confirmed that the biceps anchor was stable.  I then dissected medial to the glenoid margin and then passed the 20-gauge spinal needle into the medial glenoid neck region where the paralabral cyst had been identified on MRI scan and performed multiple penetrations and aspiration, was able to evacuate 3 to 4 mL of very viscous material consistent with ganglion contents and then introduced a shaver into these areas to completely decompress these large paralabral ganglia.  We also extended the dissection medial to the glenoid somewhat anterior to the biceps insertion to decompress the portion that had traversed anteriorly about the glenoid neck and took care to confirm that we had decompressed appropriately on all aspects of the glenoid notch both superiorly and inferiorly.  The entire area was decompressed to much to our satisfaction.  At this point, we returned our attention to glenohumeral joint and on further inspection, found that she had a broad grade 2 and 3 chondromalacia on the majority of the humeral head centrally with estimated accounting for 30-40% of the articular  surface.  There also appeared to be some evidence for chronic irritation of the anterior-inferior glenoid with the humeral head had sat somewhat anteriorly subluxed.  However, given the relatively advanced chondromalacia that we noted we did not attempt any type of capsular volume reduction for stabilization due to fears of extenuating/accelerating the degenerative changes and certainly she had not been describing sensation instability preoperatively.  We completed the chondroplasty and used the Stryker wand to obtain hemostasis of all debrided surfaces and then confirmed that the paralabral cysts had been appropriately decompressed.  At this point, final inspection and irrigation was then completed.  It  should mention with rotator cuff was carefully inspected and found to be intact.  No evidence for distal biceps instability.  Tracy A. Shuford, PA-C was used Geophysicist/field seismologist throughout this case, essential for help with positioning of the extremity, management of arthroscopic equipment, tissue manipulation and closure, and intraoperative decision making.  The portals were closed with Monocryl and Steri-Strips.  Dry dressing taped to the right shoulder.  Right arm was placed in a sling.  The patient was awakened, extubated, and taken to the recovery room in stable condition.     Vania Rea. Lorina Duffner, M.D.     KMS/MEDQ  D:  08/28/2012  T:  08/28/2012  Job:  [id number] 

## 2012-08-28 NOTE — Transfer of Care (Signed)
Immediate Anesthesia Transfer of Care Note  Patient: Tara [name]  Procedure(s) Performed: Procedure(s): RIGHT SHOULDER ARTHROSCOPY WITH LABRAL DEBRIDEMENT, ASPIRATION AND DECOMPRESSION OF PARALABRAL CYST (Right)  Patient Location: PACU  Anesthesia Type:General and Regional  Level of Consciousness: patient cooperative and responds to stimulation  Airway & Oxygen Therapy: Patient Spontanous Breathing and Patient connected to nasal cannula oxygen  Post-op Assessment: Report given to PACU RN and Post -op Vital signs reviewed and stable  Post vital signs: Reviewed and stable  Complications: No apparent anesthesia complications

## 2012-08-28 NOTE — Preoperative (Signed)
Beta Blockers   Reason not to administer Beta Blockers:Not Applicable 

## 2012-08-28 NOTE — Anesthesia Procedure Notes (Signed)
Anesthesia Regional Block:  Interscalene brachial plexus block  Pre-Anesthetic Checklist: ,, timeout performed, Correct Patient, Correct Site, Correct Laterality, Correct Procedure, Correct Position, site marked, Risks and benefits discussed,  Surgical consent,  Pre-op evaluation,  At surgeon's request and post-op pain management  Laterality: Right  Prep: chloraprep and alcohol swabs       Needles:   Needle Type: Stimulator Needle - 40        Needle insertion depth: 2 cm   Additional Needles:  Procedures: nerve stimulator Interscalene brachial plexus block  Nerve Stimulator or Paresthesia:  Response: 0.5 mA, 0.1 ms, 2 cm  Additional Responses:   Narrative:  Start time: 08/28/2012 7:00 AM End time: 08/28/2012 7:10 AM Injection made incrementally with aspirations every 5 mL.  Performed by: Personally  Anesthesiologist: Maren Beach MD  Additional Notes: Pt accepts procedure and risks.15cc 0.5% Marcaine w/epi w/o difficulty or discomfort. Pt tolerated well. GES

## 2012-08-28 NOTE — Anesthesia Preprocedure Evaluation (Signed)
Anesthesia Evaluation  Patient identified by MRN, date of birth, ID band Patient awake    Reviewed: Allergy & Precautions, H&P , NPO status , Patient's Chart, lab work & pertinent test results  Airway       Dental   Pulmonary          Cardiovascular hypertension, + Valvular Problems/Murmurs     Neuro/Psych  Headaches,  Neuromuscular disease    GI/Hepatic   Endo/Other    Renal/GU      Musculoskeletal   Abdominal   Peds  Hematology   Anesthesia Other Findings   Reproductive/Obstetrics                           Anesthesia Physical Anesthesia Plan  ASA: I  Anesthesia Plan: General   Post-op Pain Management:    Induction: Intravenous  Airway Management Planned: Oral ETT  Additional Equipment:   Intra-op Plan:   Post-operative Plan: Extubation in OR  Informed Consent: I have reviewed the patients History and Physical, chart, labs and discussed the procedure including the risks, benefits and alternatives for the proposed anesthesia with the patient or authorized representative who has indicated his/her understanding and acceptance.     Plan Discussed with: CRNA, Anesthesiologist and Surgeon  Anesthesia Plan Comments:         Anesthesia Quick Evaluation

## 2012-08-28 NOTE — Op Note (Signed)
08/28/2012  9:18 AM  PATIENT:   Tara [name]  52 y.o. female  PRE-OPERATIVE DIAGNOSIS:  Right Shoulder Labral tear with Paralabral Cyst  POST-OPERATIVE DIAGNOSIS:  Same with advanced gleno-humeral chondromalacia  PROCEDURE:  RSA, labral debridement, chondroplasty, decompression of paralabral cyst  SURGEON:  V[place] Folden, Vania Rea M.D.  ASSISTANTS: [place] pac   ANESTHESIA:   GAT + ISB  EBL: min  SPECIMEN:  none  Drains: none   PATIENT DISPOSITION:  PACU - hemodynamically stable.    PLAN OF CARE: Discharge to home after PACU  Dictation# [id number]

## 2012-08-29 ENCOUNTER — Encounter (HOSPITAL_COMMUNITY): Payer: Self-pay | Admitting: Orthopedic Surgery

## 2012-10-16 ENCOUNTER — Other Ambulatory Visit: Payer: Self-pay

## 2012-10-16 ENCOUNTER — Other Ambulatory Visit: Payer: Self-pay | Admitting: Family Medicine

## 2012-10-16 DIAGNOSIS — Z1231 Encounter for screening mammogram for malignant neoplasm of breast: Secondary | ICD-10-CM

## 2012-11-10 ENCOUNTER — Ambulatory Visit
Admission: RE | Admit: 2012-11-10 | Discharge: 2012-11-10 | Disposition: A | Discharge status: Home / Self Care | Payer: 59 | Source: Ambulatory Visit

## 2012-11-10 DIAGNOSIS — Z1231 Encounter for screening mammogram for malignant neoplasm of breast: Secondary | ICD-10-CM

## 2012-12-25 ENCOUNTER — Other Ambulatory Visit: Payer: Self-pay

## 2013-01-19 ENCOUNTER — Other Ambulatory Visit: Payer: Self-pay | Admitting: Family Medicine

## 2013-01-19 ENCOUNTER — Other Ambulatory Visit (HOSPITAL_COMMUNITY)
Admission: RE | Admit: 2013-01-19 | Discharge: 2013-01-19 | Disposition: A | Discharge status: Home / Self Care | Payer: 59 | Source: Ambulatory Visit | Attending: Family Medicine | Admitting: Family Medicine

## 2013-01-19 DIAGNOSIS — Z124 Encounter for screening for malignant neoplasm of cervix: Secondary | ICD-10-CM | POA: Insufficient documentation

## 2013-10-01 ENCOUNTER — Encounter: Payer: Self-pay | Admitting: Dietician

## 2013-10-01 ENCOUNTER — Encounter: Payer: 59 | Attending: Family Medicine | Admitting: Dietician

## 2013-10-01 VITALS — Ht 70.0 in | Wt 156.7 lb

## 2013-10-01 DIAGNOSIS — Z713 Dietary counseling and surveillance: Secondary | ICD-10-CM | POA: Diagnosis present

## 2013-10-01 DIAGNOSIS — R635 Abnormal weight gain: Secondary | ICD-10-CM | POA: Diagnosis present

## 2013-10-01 NOTE — Progress Notes (Signed)
  Medical Nutrition Therapy:  Appt start time: 340 end time:  445.   Assessment:  Primary concerns today: Tara [name] is here today to discuss her inability to lose weight. She reports that she has always been at 150 lbs or below and put on some weight (160 lbs) several years ago. Lost it, regained it, and is having difficulty losing it again. 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 She exercises M-F for 1 hour (step classes, kickboxing, cardio class, pilates) and is active on the weekends. Using MyFitnessPal, has been having 1400 calories a day for 2 years.   Preferred Learning Style:   No preference indicated   Learning Readiness:   Ready  Change in progress   MEDICATIONS: see list   DIETARY INTAKE:  Usual eating pattern includes several small meals per day. Avoided foods include most starches and all sugar.    24-hr recall:  B ( AM): mixture of chia seeds, flaxseed, oatmeal, cacao nibs, agave nectar (nibbles on this throughout the morning)  Snk ( AM):   L ( PM): tuna salad, kale salad, celery and peanut butter, nuts, egg salad, wild caught salmon Snk ( PM): cheese, nuts, fruit Snk (PM): celery and peanut butter, Greek yogurt D ( PM): seafood Snk ( PM):   Beverages: water, green tea  Usual physical activity: 1 hour M-F  Estimated energy needs: 1600-1800 calories  Progress Towards Goal(s):  In progress.   Nutritional Diagnosis:  [place]-3.4 Unintentional weight gain As related to slowed metabolism .  As evidenced by inability to lose weight despite consuming 1400 calories per day and exercising 1 hour per day.    Intervention:  Nutrition counseling provided. Recommended that the patient increase overall calories in the form of protein.   Teaching Method Utilized:  Visual Auditory Hands on   Barriers to learning/adherence to lifestyle change: none  Demonstrated degree of understanding via:  Teach Back   Monitoring/Evaluation:  Dietary intake,  exercise, and body weight prn.

## 2013-10-01 NOTE — Patient Instructions (Signed)
-  Peanut butter, nuts, PB2, Quest bar, eggs -Try unsweetened cocoa powder -PB2 cups   Aim for 1600-1800 calories 180-200 grams carbs 120-135 grams protein    4 calories per gram in protein and carbs 9 calories per gram in fat

## 2013-10-06 ENCOUNTER — Other Ambulatory Visit: Payer: Self-pay

## 2013-10-06 DIAGNOSIS — Z1231 Encounter for screening mammogram for malignant neoplasm of breast: Secondary | ICD-10-CM

## 2013-11-20 ENCOUNTER — Ambulatory Visit: Payer: 59

## 2013-11-20 ENCOUNTER — Ambulatory Visit
Admission: RE | Admit: 2013-11-20 | Discharge: 2013-11-20 | Disposition: A | Discharge status: Home / Self Care | Payer: 59 | Source: Ambulatory Visit

## 2013-11-20 DIAGNOSIS — Z1231 Encounter for screening mammogram for malignant neoplasm of breast: Secondary | ICD-10-CM

## 2014-07-28 ENCOUNTER — Other Ambulatory Visit (INDEPENDENT_AMBULATORY_CARE_PROVIDER_SITE_OTHER): Payer: 59

## 2014-07-28 ENCOUNTER — Encounter: Payer: Self-pay | Admitting: Family Medicine

## 2014-07-28 ENCOUNTER — Ambulatory Visit (INDEPENDENT_AMBULATORY_CARE_PROVIDER_SITE_OTHER): Payer: 59 | Admitting: Family Medicine

## 2014-07-28 VITALS — BP 106/68 | HR 70 | Ht 70.0 in | Wt 152.0 lb

## 2014-07-28 DIAGNOSIS — M25511 Pain in right shoulder: Secondary | ICD-10-CM

## 2014-07-28 DIAGNOSIS — M129 Arthropathy, unspecified: Secondary | ICD-10-CM | POA: Diagnosis not present

## 2014-07-28 DIAGNOSIS — M19011 Primary osteoarthritis, right shoulder: Secondary | ICD-10-CM | POA: Insufficient documentation

## 2014-07-28 MED ORDER — PREDNISONE 50 MG PO TABS: 50.0000 mg | ORAL_TABLET | Freq: Every day | ORAL | Status: DC

## 2014-07-28 MED ORDER — VITAMIN D (ERGOCALCIFEROL) 1.25 MG (50000 UNIT) PO CAPS: 50000.0000 [IU] | ORAL_CAPSULE | ORAL | Status: DC

## 2014-07-28 MED ORDER — GABAPENTIN 100 MG PO CAPS: 100.0000 mg | ORAL_CAPSULE | Freq: Every day | ORAL | Status: DC

## 2014-07-28 NOTE — Assessment & Plan Note (Signed)
I do believe that patient's arthritis in her shoulder from the previous MRI postsurgical changes is likely contributing to most of her discomfort. We discussed also with patient's muscle atrophy and using that there is some nerve injury that is also being contributed. After long discussion with patient patient is elected try conservative therapy and we are going to do natural supplementations and decreased pain and patient will do a short course of prednisone as well as gabapentin for any of the neurovascular compromise that could be also contributed. Due to the longevity of the problem I discussed with patient I do not think she will be pain-free and discussed with her that pain would be improved with a shoulder replacement but I do not know if function would improve. Patient also given a trial of topical anti-inflammatory set I'm hoping that will be beneficial. Patient can call if she would like prescription. Patient will try this management and touch base with me in 2 weeks and then follow-up with me in 4 weeks in the office.

## 2014-07-28 NOTE — Progress Notes (Signed)
Pre visit review using our clinic review tool, if applicable. No additional management support is needed unless otherwise documented below in the visit note. 

## 2014-07-28 NOTE — Patient Instructions (Addendum)
Good to see you.  Ice 20 minutes 2 times daily. Usually after activity and before bed. Take tylenol 650 mg three times a day is the best evidence based medicine we have for arthritis.  Glucosamine sulfate 1500mg  twice a day is a supplement that has been shown to help moderate to severe arthritis. Vitamin D 50000 weekly Fish oil 2 grams daily.  Tumeric 500mg  twice daily. Or continue your dose.  pennsaid pinkie amount topically 2 times daily as needed.  If cortisone injections do not help, there are different types of shots that may help but they take longer to take effect.  We can discuss this at follow up.  Consider PRP as well.  Keep hands within peripheral vision  Sleep with pillow under arm.  Gabapentin 100mg  nightly Prednisone daily for 5 days Come back and see me in 3 weeks.

## 2014-07-28 NOTE — Progress Notes (Signed)
[facility] [address], [place] [address] Phone: [phone] Subjective:    I'm seeing this patient by the request  of:  Reginia Naas, MD   CC: Right shoulder pain.  Tara [name] is a 54 y.o. female coming in with complaint of right shoulder pain. Patient does have a past medical history significant for right shoulder pain. Back in 2013 in 2014 patient actually had a labral cyst and a labral tear in this right shoulder with advanced osteoarthritis of the shoulder. Patient did undergo a right shoulder arthroscopy with labral debridement and aspiration of appear labral cyst back in July 2014. Patient before surgery did try, home exercises, nitroglycerin without any significant improvement. Patient states even after surgery she never had any significant decrease in pain. Patient states that she is actually losing more function in this arm as well over the course of time. Patient remains fairly active trying to work on a regular basis but has significant pain a muscle time. Patient describes the pain is more of a dull, throbbing aching pain. Patient states that sometimes sharp with certain movements. Patient has weakness and is unable to raise her arm above her head on a regular basis without any other arm. Significant crepitus. The severity is 7 out of 10.     Past Medical History  Diagnosis Date  . Mitral valve prolapse    Past Surgical History  Procedure Laterality Date  . Knee surgery  1988    right  . Shoulder arthroscopy with labral repair Right 08/28/2012    Procedure: RIGHT SHOULDER ARTHROSCOPY WITH LABRAL DEBRIDEMENT, ASPIRATION AND DECOMPRESSION OF PARALABRAL CYST;  Surgeon: Marin Shutter, MD;  Location: [facility];  Service: Orthopedics;  Laterality: Right;   History  Substance Use Topics  . Smoking status: Never Smoker   . Smokeless tobacco: Never Used  . Alcohol Use: No   Allergies  Allergen Reactions  .  Codeine   . Hydrocodone    Family History  Problem Relation Age of Onset  . Sudden death Neg Hx   . Hypertension Neg Hx   . Hyperlipidemia Neg Hx   . Heart attack Neg Hx   . Diabetes Neg Hx      Past medical history, social, surgical and family history all reviewed in electronic medical record.   Review of Systems: No headache, visual changes, nausea, vomiting, diarrhea, constipation, dizziness, abdominal pain, skin rash, fevers, chills, night sweats, weight loss, swollen lymph nodes, body aches, joint swelling, muscle aches, chest pain, shortness of breath, mood changes.   Objective Blood pressure 106/68, pulse 70, height 5\' 10"  (1.778 m), weight 152 lb (68.947 kg), SpO2 94 %.  General: No apparent distress alert and oriented x3 mood and affect normal, dressed appropriately.  HEENT: Pupils equal, extraocular movements intact  Respiratory: Patient's speak in full sentences and does not appear short of breath  Cardiovascular: No lower extremity edema, non tender, no erythema  Skin: Warm dry intact with no signs of infection or rash on extremities or on axial skeleton.  Abdomen: Soft nontender  Neuro: Cranial nerves II through XII are intact, neurovascularly intact in all extremities with 2+ DTRs and 2+ pulses.  Lymph: No lymphadenopathy of posterior or anterior cervical chain or axillae bilaterally.  Gait normal with good balance and coordination.  MSK:  Non tender with full range of motion and good stability and symmetric strength and tone of  elbows, wrist, hip, knee and ankles bilaterally.  Shoulder: Right Significant atrophy of the shoulder girdle musculature. Especially on the posterior aspect. Range of motion actively is decreased to forward flexion of 85, full internal rotation but external 5 degreespassively patient does have full range of motion but significant crepitus and pain. Rotator cuff strength 4 out of 5 compared to contralateral side. signs of impingement with  positive Neer and Hawkin's tests, but negative empty can sign. Speeds and Yergason's tests positive No labral pathology noted with negative Obrien's, negative clunk and good stability. Normal scapular function observed. No painful arc and no drop arm sign. No apprehension sign Contralateral shoulder unremarkable  MSK US performed of: Right This study was ordered, performed, and interpreted by Charlann Boxer D.O.  Shoulder:   Supraspinatus:  Appears normal on long and transverse views, Bursal bulge seen with shoulder abduction on impingement view. Infraspinatus:  Fat atrophy Subscapularis:  Appears normal on long and transverse views. Positive bursa Teres Minor:   Fat  atrophy. AC joint:  moderate OA. . Glenohumeral Joint:   Severe arthritis Glenoid Labrum:  I degenerative changes Biceps Tendon:  Appears normal on long and transverse views, no fraying of tendon, tendon located in intertubercular groove, no subluxation with shoulder internal or external rotation.  Impression:  Significant glenohumeral arthritis with severe atrophy of the infraspinatus     Impression and Recommendations:     This case required medical decision making of moderate complexity.

## 2014-07-31 ENCOUNTER — Encounter: Payer: Self-pay | Admitting: Family Medicine

## 2014-08-25 ENCOUNTER — Other Ambulatory Visit: Payer: 59

## 2014-08-25 ENCOUNTER — Other Ambulatory Visit (INDEPENDENT_AMBULATORY_CARE_PROVIDER_SITE_OTHER): Payer: 59

## 2014-08-25 ENCOUNTER — Ambulatory Visit (INDEPENDENT_AMBULATORY_CARE_PROVIDER_SITE_OTHER): Payer: 59 | Admitting: Family Medicine

## 2014-08-25 ENCOUNTER — Encounter: Payer: Self-pay | Admitting: Family Medicine

## 2014-08-25 VITALS — BP 110/74 | HR 74 | Wt 148.0 lb

## 2014-08-25 DIAGNOSIS — M2550 Pain in unspecified joint: Secondary | ICD-10-CM | POA: Diagnosis not present

## 2014-08-25 DIAGNOSIS — M129 Arthropathy, unspecified: Secondary | ICD-10-CM

## 2014-08-25 DIAGNOSIS — G8929 Other chronic pain: Secondary | ICD-10-CM

## 2014-08-25 DIAGNOSIS — M19011 Primary osteoarthritis, right shoulder: Secondary | ICD-10-CM

## 2014-08-25 LAB — C-REACTIVE PROTEIN: CRP: <0.1 mg/dL — ABNORMAL LOW (ref 0.5–20.0)

## 2014-08-25 LAB — SEDIMENTATION RATE: Sed Rate: 12 mm/hr (ref 0–22)

## 2014-08-25 NOTE — Patient Instructions (Addendum)
Good ot see you Consider injeciton in should at a later date Ice is your friend Certain night try gabapentin 200mg  at night and if helpful and can tolerate I would consider continuing the higher dose or go back and forth. We can consider another nerve conduction study to see if nerve is waking up . Would avoid surgeyr for now with [place] improving some See me again in 4-6 weeks.

## 2014-08-25 NOTE — Progress Notes (Signed)
[name] [address], [place] [address] Phone: [phone] Subjective:    I'm seeing this patient by the request  of:  Reginia Naas, MD   CC: Right shoulder pain.  Tara [name] is a 54 y.o. female coming in with complaint of right shoulder pain. Patient does have a past medical history significant for right shoulder pain. Back in 2013 in 2014 patient actually had a labral cyst and a labral tear in this right shoulder with advanced osteoarthritis of the shoulder. Patient did undergo a right shoulder arthroscopy with labral debridement and aspiration of appear labral cyst back in July 2014. Patient before surgery did try, home exercises, nitroglycerin without any significant improvement.  Patient was treated with prednisone as well as gabapentin recently. Patient states that actually this has made a significant improvement by approximately 50%. Patient is still having some difficulty at night but has been sleeping more hours than usual at a time. Patient states that the over-the-counter natural supplementations may be helping. States that the gabapentin is helping at night. He is taking the vitamin D supplementation fairly regularly. Patient did not notice though any improvement in the strength of the right hand. Patient does complain of multiple joints that giving her some discomfort. States that certain range of motion exercises started giving her a audible popping of the hips and this causes some discomfort. Same thing in her left knee. Denies any swelling. Patient is concern for Y all these joints are hurting though.     Past Medical History  Diagnosis Date  . Mitral valve prolapse    Past Surgical History  Procedure Laterality Date  . Knee surgery  1988    right  . Shoulder arthroscopy with labral repair Right 08/28/2012    Procedure: RIGHT SHOULDER ARTHROSCOPY WITH LABRAL DEBRIDEMENT, ASPIRATION AND DECOMPRESSION OF  PARALABRAL CYST;  Surgeon: Marin Shutter, MD;  Location: [facility];  Service: Orthopedics;  Laterality: Right;   History  Substance Use Topics  . Smoking status: Never Smoker   . Smokeless tobacco: Never Used  . Alcohol Use: No   Allergies  Allergen Reactions  . Codeine   . Hydrocodone    Family History  Problem Relation Age of Onset  . Sudden death Neg Hx   . Hypertension Neg Hx   . Hyperlipidemia Neg Hx   . Heart attack Neg Hx   . Diabetes Neg Hx      Past medical history, social, surgical and family history all reviewed in electronic medical record.   Review of Systems: No headache, visual changes, nausea, vomiting, diarrhea, constipation, dizziness, abdominal pain, skin rash, fevers, chills, night sweats, weight loss, swollen lymph nodes, body aches, joint swelling, muscle aches, chest pain, shortness of breath, mood changes.   Objective Blood pressure 110/74, pulse 74, weight 148 lb (67.132 kg), SpO2 97 %.  General: No apparent distress alert and oriented x3 mood and affect normal, dressed appropriately.  HEENT: Pupils equal, extraocular movements intact  Respiratory: Patient's speak in full sentences and does not appear short of breath  Cardiovascular: No lower extremity edema, non tender, no erythema  Skin: Warm dry intact with no signs of infection or rash on extremities or on axial skeleton.  Abdomen: Soft nontender  Neuro: Cranial nerves II through XII are intact, neurovascularly intact in all extremities with 2+ DTRs and 2+ pulses.  Lymph: No lymphadenopathy of posterior or anterior cervical chain or axillae bilaterally.  Gait normal with good  balance and coordination.  MSK:  Non tender with full range of motion and good stability and symmetric strength and tone of  elbows, wrist, hip, knee and ankles bilaterally. Patient does have some mild discomfort of the hips as well as the knees but does have good range of motion. Mild crepitus noted. Shoulder: Right Significant  atrophy of the shoulder girdle musculature still noted Range of motion actively is decreased to forward flexion of 85, full internal rotation but external 5 degrees passively with significant crepitus and pain. Rotator cuff strength 4 out of 5 compared to contralateral side. signs of impingement with positive Neer and Hawkin's tests, but negative empty can signstill present Speeds and Yergason's tests negative and improved significantly No labral pathology noted with negative Obrien's, negative clunk and good stability. Normal scapular function observed. No painful arc and no drop arm sign. No apprehension sign Contralateral shoulder unremarkable Patient does have weakness in the C5-C6 distribution compared to contralateral hand     Impression and Recommendations:     This case required medical decision making of moderate complexity.

## 2014-08-25 NOTE — Assessment & Plan Note (Signed)
I do believe that patient's underlying problem of the arthritis as well as likely the compression she had previously was given her difficulty. Patient is having multiple polyarthralgia at this point and we will get labs to rule out any other abnormality that can be contribute in. Patient continue with the natural supplementation. Patient has any worsening symptoms we would consider an injection into the joint. Patient continues to have difficulty we may need to consider advanced imaging to make sure that there is no repeat labral cyst noted. I'm optimistic though with patient actually make improvement she will continue to do well with conservative therapy. We did discuss that replacement could be necessary but due to patient's young age we may want to consider holding off her longer amount of time.

## 2014-08-25 NOTE — Progress Notes (Signed)
Pre visit review using our clinic review tool, if applicable. No additional management support is needed unless otherwise documented below in the visit note. 

## 2014-08-26 LAB — PTH, INTACT AND CALCIUM
CALCIUM: 9.5 mg/dL (ref 8.4–10.5)
PTH: 28 pg/mL (ref 14–64)

## 2014-08-26 LAB — CALCIUM, IONIZED: Calcium, Ion: 1.3 mmol/L (ref 1.12–1.32)

## 2014-08-26 LAB — CYCLIC CITRUL PEPTIDE ANTIBODY, IGG

## 2014-08-26 LAB — ANTI-NUCLEAR AB-TITER (ANA TITER): ANA Titer 1: NEGATIVE

## 2014-08-26 LAB — ANA: Anti Nuclear Antibody(ANA): POSITIVE — AB

## 2014-08-26 LAB — ANTI-DNA ANTIBODY, DOUBLE-STRANDED: DS DNA AB: 6 [IU]/mL — AB

## 2014-09-07 ENCOUNTER — Encounter: Payer: Self-pay | Admitting: Family Medicine

## 2014-09-08 NOTE — Addendum Note (Signed)
Addended by: Montez Hageman on: 09/08/2014 09:51 AM   Modules accepted: Orders

## 2014-09-24 ENCOUNTER — Encounter: Payer: Self-pay | Admitting: Family Medicine

## 2014-09-27 ENCOUNTER — Encounter: Payer: Self-pay | Admitting: Family Medicine

## 2014-10-06 ENCOUNTER — Encounter: Payer: Self-pay | Admitting: Family Medicine

## 2014-10-08 ENCOUNTER — Encounter: Payer: Self-pay | Admitting: Family Medicine

## 2014-10-11 ENCOUNTER — Ambulatory Visit: Payer: 59 | Admitting: Family Medicine

## 2014-10-29 ENCOUNTER — Encounter: Payer: Self-pay | Admitting: Podiatry

## 2014-10-29 ENCOUNTER — Ambulatory Visit (INDEPENDENT_AMBULATORY_CARE_PROVIDER_SITE_OTHER): Payer: 59 | Admitting: Podiatry

## 2014-10-29 VITALS — BP 124/86 | HR 64 | Resp 12

## 2014-10-29 DIAGNOSIS — L03031 Cellulitis of right toe: Secondary | ICD-10-CM | POA: Diagnosis not present

## 2014-10-29 MED ORDER — CEPHALEXIN 500 MG PO CAPS: 500.0000 mg | ORAL_CAPSULE | Freq: Four times a day (QID) | ORAL | Status: DC

## 2014-10-29 NOTE — Progress Notes (Signed)
   Subjective:    Patient ID: Tara [name], female    DOB: [birth year], 54 y.o.   MRN: [id number]  HPI RT FOOT GREAT TOENAIL IS GROWING BACK AND PAINFUL. This patient presents to office saying the nail border that she had previous surgery is growing back and is painful.  She says her toe is painful walking and wearing her shoes.  No drainage noted from old surgical site.  Mild redness and swelling with callus in nail groove.  Review of Systems  Skin: Positive for color change.       Objective:   Physical Exam GENERAL APPEARANCE: Alert, conversant. Appropriately groomed. No acute distress.  VASCULAR: Pedal pulses palpable at  [facility] and PT bilateral.  Capillary refill time is immediate to all digits,  Normal temperature gradient.  Digital hair growth is present bilateral  NEUROLOGIC: sensation is normal to 5.07 monofilament at 5/5 sites bilateral.  Light touch is intact bilateral, Muscle strength normal.  MUSCULOSKELETAL: acceptable muscle strength, tone and stability bilateral.  Intrinsic muscluature intact bilateral.  Rectus appearance of foot and digits noted bilateral.   DERMATOLOGIC: skin color, texture, and turgor are within normal limits.  No preulcerative lesions or ulcers  are seen, no interdigital maceration noted.  No open lesions present.  . No drainage noted.  NAIL  There is nail spicule as well as inflammation noted medial border right great toenail.          Assessment & Plan:  Paronychia right hallux.  ROV  Nail surgery.  Treatment options and alternatives discussed.  Recommended permanent phenol matrixectomy and patient agreed.  Right great toe  was prepped with alcohol and a toe block of 3cc of 2% lidocaine plain was administered in a digital toe block. .  The toe was then prepped with betadine solution .  The offending nail border was then excised and matrix tissue exposed.  Phenol was then applied to the matrix tissue followed by an alcohol wash.  Antibiotic ointment  and a dry sterile dressing was applied.  The patient was dispensed instructions for aftercare.  Cephalexin prescribed # 28. Take one qid.

## 2014-11-01 ENCOUNTER — Other Ambulatory Visit: Payer: Self-pay | Admitting: Rheumatology

## 2014-11-01 DIAGNOSIS — M25511 Pain in right shoulder: Secondary | ICD-10-CM

## 2014-11-11 ENCOUNTER — Encounter: Payer: Self-pay | Admitting: Podiatry

## 2014-11-11 ENCOUNTER — Other Ambulatory Visit: Payer: Self-pay

## 2014-11-11 ENCOUNTER — Ambulatory Visit (INDEPENDENT_AMBULATORY_CARE_PROVIDER_SITE_OTHER): Payer: 59 | Admitting: Podiatry

## 2014-11-11 VITALS — BP 125/77 | HR 61 | Resp 12

## 2014-11-11 DIAGNOSIS — Z09 Encounter for follow-up examination after completed treatment for conditions other than malignant neoplasm: Secondary | ICD-10-CM

## 2014-11-11 DIAGNOSIS — Z1231 Encounter for screening mammogram for malignant neoplasm of breast: Secondary | ICD-10-CM

## 2014-11-11 MED ORDER — DOXYCYCLINE HYCLATE 100 MG PO TABS: 100.0000 mg | ORAL_TABLET | Freq: Two times a day (BID) | ORAL | Status: DC

## 2014-11-11 NOTE — Progress Notes (Signed)
Subjective:     Patient ID: Tara [name], female   DOB: [birth year], 54 y.o.   MRN: [id number]  HPIThis patient presents follow up nail surgery.  She says the site of the nail surgery is painful and appears to be healing with additional nail.  She soaks and peroxides and takes antibiotics as directed. She desires the surgical site be examined.   Review of Systems     Objective:   Physical Exam GENERAL APPEARANCE: Alert, conversant. Appropriately groomed. No acute distress.  VASCULAR: Pedal pulses palpable at  [facility] and PT bilateral.  Capillary refill time is immediate to all digits,  Normal temperature gradient.  Digital hair growth is present bilateral  NEUROLOGIC: sensation is normal to 5.07 monofilament at 5/5 sites bilateral.  Light touch is intact bilateral, Muscle strength normal.  MUSCULOSKELETAL: acceptable muscle strength, tone and stability bilateral.  Intrinsic muscluature intact bilateral.  Rectus appearance of foot and digits noted bilateral.   DERMATOLOGIC: skin color, texture, and turgor are within normal limits.  No preulcerative lesions or ulcers  are seen, no interdigital maceration noted.  No open lesions present. . No drainage noted. NAIL  There is normal granulation tissue atjust under the proximal nail fold.  There is reddened inflamed tissue under the proximal nail fold.  No evidence of redness or swelling to proximal nail.       Assessment:     S/p nail surgery     Plan:     ROV>  Debride necrotic tissue.  Amerigel ?DSD.  Doxycycline prescribed.  RTC prn

## 2014-12-06 ENCOUNTER — Ambulatory Visit
Admission: RE | Admit: 2014-12-06 | Discharge: 2014-12-06 | Disposition: A | Discharge status: Home / Self Care | Payer: 59 | Source: Ambulatory Visit | Attending: Rheumatology | Admitting: Rheumatology

## 2014-12-06 DIAGNOSIS — M25511 Pain in right shoulder: Secondary | ICD-10-CM

## 2014-12-06 MED ORDER — GADOBENATE DIMEGLUMINE 529 MG/ML IV SOLN
14.0000 mL | Freq: Once | INTRAVENOUS | Status: AC | PRN
  Administered 2014-12-06: 14 mL via INTRAVENOUS

## 2014-12-10 ENCOUNTER — Ambulatory Visit: Payer: 59

## 2014-12-22 ENCOUNTER — Ambulatory Visit: Payer: 59

## 2014-12-22 ENCOUNTER — Ambulatory Visit
Admission: RE | Admit: 2014-12-22 | Discharge: 2014-12-22 | Disposition: A | Discharge status: Home / Self Care | Payer: 59 | Source: Ambulatory Visit

## 2014-12-22 DIAGNOSIS — Z1231 Encounter for screening mammogram for malignant neoplasm of breast: Secondary | ICD-10-CM

## 2014-12-30 ENCOUNTER — Ambulatory Visit (INDEPENDENT_AMBULATORY_CARE_PROVIDER_SITE_OTHER): Payer: 59 | Admitting: Podiatry

## 2014-12-30 ENCOUNTER — Encounter: Payer: Self-pay | Admitting: Podiatry

## 2014-12-30 VITALS — BP 100/64 | HR 71 | Resp 12

## 2014-12-30 DIAGNOSIS — Z09 Encounter for follow-up examination after completed treatment for conditions other than malignant neoplasm: Secondary | ICD-10-CM

## 2014-12-31 ENCOUNTER — Telehealth: Payer: Self-pay | Admitting: *Deleted

## 2014-12-31 MED ORDER — UREA 40 % EX OINT: TOPICAL_OINTMENT | CUTANEOUS | Status: DC

## 2014-12-31 NOTE — Progress Notes (Signed)
Subjective:     Patient ID: Tara [name], female   DOB: May 29, 1960, 54 y.o.   MRN: [id number]  HPIThis patient presents to the office with continued discomfort and pressure along the right big toenail.  She previously had two separated nail surgeries which included phenol usage.  She presents saying the toe has hard tissue at the site of the surgery and desires further evaluation.   Review of Systems     Objective:   Physical Exam     Physical Exam GENERAL APPEARANCE: Alert, conversant. Appropriately groomed. No acute distress.  VASCULAR: Pedal pulses palpable at [facility] and PT bilateral. Capillary refill time is immediate to all digits, Normal temperature gradient. Digital hair growth is present bilateral  NEUROLOGIC: sensation is normal to 5.07 monofilament at 5/5 sites bilateral. Light touch is intact bilateral, Muscle strength normal.  MUSCULOSKELETAL: acceptable muscle strength, tone and stability bilateral. Intrinsic muscluature intact bilateral. Rectus appearance of foot and digits noted bilateral.   DERMATOLOGIC: skin color, texture, and turgor are within normal limits. No preulcerative lesions or ulcers are seen, no interdigital maceration noted. No open lesions present. . No drainage noted.  NAIL There is no evidence of redness or swelling or infection. noted medial border right great toenail. There are sites at the proximal nail fold which have developed callus and are painful upon palpating.            Assessment:     S/p nail surgery     Plan:     ROV  Nail surgery has been effective but she has developed callus at proximal nail fold.  Debrided the tissue.  Told her to consider acid application [id number] urea at time of sleep.

## 2014-12-31 NOTE — Telephone Encounter (Signed)
Pt states Dr. Prudence Davidson told pt to get 40% Urea cream, but pharmacy states needs a rx.  I informed pt the Urea Creams were not covered by insurance, and we had a Revitaderm40% Urea cream in our office, 4 oz for $22.00.

## 2015-01-20 ENCOUNTER — Ambulatory Visit (INDEPENDENT_AMBULATORY_CARE_PROVIDER_SITE_OTHER): Payer: 59 | Admitting: Podiatry

## 2015-01-20 ENCOUNTER — Encounter: Payer: Self-pay | Admitting: Podiatry

## 2015-01-20 VITALS — BP 102/58 | HR 74 | Resp 12

## 2015-01-20 DIAGNOSIS — Z09 Encounter for follow-up examination after completed treatment for conditions other than malignant neoplasm: Secondary | ICD-10-CM

## 2015-01-20 NOTE — Progress Notes (Signed)
Subjective:     Patient ID: Tara [name], female   DOB: September 03, 1960, 54 y.o.   MRN: [id number]  HPI this patient returns to the office following nail surgery for the eli. She states she still is having pain along the inside border the big toe of the right foot. She says the fields querying has helped. The toe looked better, but she is still experiencing pain. She points to an area where there is still a nail present along the inside border of the right great toe .  No swelling or drainage noted   Review of Systems     Objective:   Physical Exam  Objective:   Physical Exam GENERAL APPEARANCE: Alert, conversant. Appropriately groomed. No acute distress.  VASCULAR: Pedal pulses palpable at [facility] and PT bilateral. Capillary refill time is immediate to all digits, Normal temperature gradient. Digital hair growth is present bilateral  NEUROLOGIC: sensation is normal to 5.07 monofilament at 5/5 sites bilateral. Light touch is intact bilateral, Muscle strength normal.  MUSCULOSKELETAL: acceptable muscle strength, tone and stability bilateral. Intrinsic muscluature intact bilateral. Rectus appearance of foot and digits noted bilateral.   DERMATOLOGIC: skin color, texture, and turgor are within normal limits. No preulcerative lesions or ulcers are seen, no interdigital maceration noted. No open lesions present. . No drainage noted. NAIL  There is small nail spicule right hallux with no signs of infection.           Assessment:    Nail Spicule right hallux     Plan:     ROV  Remove nail spicule.  Discussed permanent cauterization in future.  Gardiner Barefoot DPM

## 2015-02-20 LAB — HM DEXA SCAN

## 2015-03-17 DIAGNOSIS — Z780 Asymptomatic menopausal state: Secondary | ICD-10-CM | POA: Diagnosis not present

## 2015-03-17 DIAGNOSIS — M8589 Other specified disorders of bone density and structure, multiple sites: Secondary | ICD-10-CM | POA: Diagnosis not present

## 2015-03-17 DIAGNOSIS — Z13820 Encounter for screening for osteoporosis: Secondary | ICD-10-CM | POA: Diagnosis not present

## 2015-03-24 MED FILL — MELOXICAM 15 MG TABLET: 15 | 90 days supply | Qty: 90 | Fill #1

## 2015-04-15 MED FILL — TRETINOIN 0.025% CREAM: 0.025 | 30 days supply | Qty: 20 | Fill #1

## 2015-04-19 DIAGNOSIS — R768 Other specified abnormal immunological findings in serum: Secondary | ICD-10-CM | POA: Diagnosis not present

## 2015-04-19 DIAGNOSIS — M25511 Pain in right shoulder: Secondary | ICD-10-CM | POA: Diagnosis not present

## 2015-04-19 DIAGNOSIS — M150 Primary generalized (osteo)arthritis: Secondary | ICD-10-CM | POA: Diagnosis not present

## 2015-04-19 DIAGNOSIS — M8580 Other specified disorders of bone density and structure, unspecified site: Secondary | ICD-10-CM | POA: Diagnosis not present

## 2015-04-19 DIAGNOSIS — M2550 Pain in unspecified joint: Secondary | ICD-10-CM | POA: Diagnosis not present

## 2015-05-16 ENCOUNTER — Encounter: Payer: Self-pay | Admitting: Internal Medicine

## 2015-06-29 MED FILL — NAPROXEN 500 MG TABLET: 500 | 30 days supply | Qty: 60 | Fill #0

## 2015-08-24 DIAGNOSIS — M3500 Sicca syndrome, unspecified: Secondary | ICD-10-CM | POA: Diagnosis not present

## 2015-08-24 DIAGNOSIS — M150 Primary generalized (osteo)arthritis: Secondary | ICD-10-CM | POA: Diagnosis not present

## 2015-08-24 DIAGNOSIS — M8580 Other specified disorders of bone density and structure, unspecified site: Secondary | ICD-10-CM | POA: Diagnosis not present

## 2015-08-24 DIAGNOSIS — M2550 Pain in unspecified joint: Secondary | ICD-10-CM | POA: Diagnosis not present

## 2015-08-24 DIAGNOSIS — R768 Other specified abnormal immunological findings in serum: Secondary | ICD-10-CM | POA: Diagnosis not present

## 2015-09-16 DIAGNOSIS — Z0100 Encounter for examination of eyes and vision without abnormal findings: Secondary | ICD-10-CM | POA: Diagnosis not present

## 2015-11-09 DIAGNOSIS — R42 Dizziness and giddiness: Secondary | ICD-10-CM | POA: Diagnosis not present

## 2015-11-09 DIAGNOSIS — R0981 Nasal congestion: Secondary | ICD-10-CM | POA: Diagnosis not present

## 2015-11-09 MED FILL — AMOXICILLIN 875 MG TABLET: 875 | 10 days supply | Qty: 20 | Fill #0

## 2015-11-23 ENCOUNTER — Other Ambulatory Visit: Payer: Self-pay | Admitting: Family Medicine

## 2015-11-23 DIAGNOSIS — Z1231 Encounter for screening mammogram for malignant neoplasm of breast: Secondary | ICD-10-CM

## 2015-12-26 ENCOUNTER — Ambulatory Visit
Admission: RE | Admit: 2015-12-26 | Discharge: 2015-12-26 | Disposition: A | Discharge status: Home / Self Care | Payer: 59 | Source: Ambulatory Visit | Attending: Family Medicine | Admitting: Family Medicine

## 2015-12-26 DIAGNOSIS — Z1231 Encounter for screening mammogram for malignant neoplasm of breast: Secondary | ICD-10-CM

## 2015-12-29 ENCOUNTER — Other Ambulatory Visit: Payer: Self-pay | Admitting: Family Medicine

## 2015-12-29 DIAGNOSIS — R928 Other abnormal and inconclusive findings on diagnostic imaging of breast: Secondary | ICD-10-CM

## 2016-01-04 ENCOUNTER — Ambulatory Visit
Admission: RE | Admit: 2016-01-04 | Discharge: 2016-01-04 | Disposition: A | Discharge status: Home / Self Care | Payer: 59 | Source: Ambulatory Visit | Attending: Family Medicine | Admitting: Family Medicine

## 2016-01-04 ENCOUNTER — Other Ambulatory Visit: Payer: Self-pay | Admitting: Family Medicine

## 2016-01-04 DIAGNOSIS — M79621 Pain in right upper arm: Secondary | ICD-10-CM

## 2016-01-04 DIAGNOSIS — R2231 Localized swelling, mass and lump, right upper limb: Secondary | ICD-10-CM

## 2016-01-04 DIAGNOSIS — N644 Mastodynia: Secondary | ICD-10-CM | POA: Diagnosis not present

## 2016-01-04 DIAGNOSIS — R229 Localized swelling, mass and lump, unspecified: Secondary | ICD-10-CM | POA: Diagnosis not present

## 2016-01-04 DIAGNOSIS — R928 Other abnormal and inconclusive findings on diagnostic imaging of breast: Secondary | ICD-10-CM

## 2016-01-10 ENCOUNTER — Other Ambulatory Visit (HOSPITAL_COMMUNITY): Admission: RE | Admit: 2016-01-10 | Payer: 59 | Source: Ambulatory Visit | Admitting: Body Imaging

## 2016-01-10 ENCOUNTER — Other Ambulatory Visit: Payer: Self-pay | Admitting: Family Medicine

## 2016-01-10 ENCOUNTER — Ambulatory Visit
Admission: RE | Admit: 2016-01-10 | Discharge: 2016-01-10 | Disposition: A | Discharge status: Home / Self Care | Payer: 59 | Source: Ambulatory Visit | Attending: Family Medicine | Admitting: Family Medicine

## 2016-01-10 DIAGNOSIS — R590 Localized enlarged lymph nodes: Secondary | ICD-10-CM | POA: Diagnosis not present

## 2016-01-10 DIAGNOSIS — R2231 Localized swelling, mass and lump, right upper limb: Secondary | ICD-10-CM

## 2016-01-17 ENCOUNTER — Telehealth: Payer: 59 | Admitting: Family

## 2016-01-17 DIAGNOSIS — B354 Tinea corporis: Secondary | ICD-10-CM | POA: Diagnosis not present

## 2016-01-17 MED ORDER — CLOTRIMAZOLE-BETAMETHASONE 1-0.05 % EX CREA: 1.0000 "application " | TOPICAL_CREAM | Freq: Two times a day (BID) | CUTANEOUS | 0 refills | Status: DC

## 2016-01-17 MED FILL — CLOTRIMAZOLE-BETAMETHASONE: 1-0.05 | 10 days supply | Qty: 30 | Fill #0

## 2016-01-17 NOTE — Progress Notes (Signed)
E Visit for Rash  We are sorry that you are not feeling well. Here is how we plan to help!  You appear to have a fungal rash. I have prescribed a cream for you to use to the affected area twice a day.   HOME CARE:   Take cool showers and avoid direct sunlight.  Apply cool compress or wet dressings.  Take a bath in an oatmeal bath.  Sprinkle content of one Aveeno packet under running faucet with comfortably warm water.  Bathe for 15-20 minutes, 1-2 times daily.  Pat dry with a towel. Do not rub the rash.  Use hydrocortisone cream.  Take an antihistamine like Benadryl for widespread rashes that itch.  The adult dose of Benadryl is 25-50 mg by mouth 4 times daily.  Caution:  This type of medication may cause sleepiness.  Do not drink alcohol, drive, or operate dangerous machinery while taking antihistamines.  Do not take these medications if you have prostate enlargement.  Read package instructions thoroughly on all medications that you take.  GET HELP RIGHT AWAY IF:   Symptoms don't go away after treatment.  Severe itching that persists.  If you rash spreads or swells.  If you rash begins to smell.  If it blisters and opens or develops a yellow-brown crust.  You develop a fever.  You have a sore throat.  You become short of breath.  MAKE SURE YOU:  Understand these instructions. Will watch your condition. Will get help right away if you are not doing well or get worse.  Thank you for choosing an e-visit. Your e-visit answers were reviewed by a board certified advanced clinical practitioner to complete your personal care plan. Depending upon the condition, your plan could have included both over the counter or prescription medications. Please review your pharmacy choice. Be sure that the pharmacy you have chosen is open so that you can pick up your prescription now.  If there is a problem you may message your provider in [place] to have the prescription routed to another  pharmacy. Your safety is important to [place]. If you have drug allergies check your prescription carefully.  For the next 24 hours, you can use MyChart to ask questions about today's visit, request a non-urgent call back, or ask for a work or school excuse from your e-visit provider. You will get an email in the next two days asking about your experience. I hope that your e-visit has been valuable and will speed your recovery.

## 2016-02-08 ENCOUNTER — Other Ambulatory Visit: Payer: Self-pay | Admitting: Family Medicine

## 2016-02-08 ENCOUNTER — Other Ambulatory Visit (HOSPITAL_COMMUNITY)
Admission: RE | Admit: 2016-02-08 | Discharge: 2016-02-08 | Disposition: A | Discharge status: Home / Self Care | Payer: 59 | Source: Ambulatory Visit | Attending: Family Medicine | Admitting: Family Medicine

## 2016-02-08 DIAGNOSIS — Z13220 Encounter for screening for lipoid disorders: Secondary | ICD-10-CM | POA: Diagnosis not present

## 2016-02-08 DIAGNOSIS — Z0000 Encounter for general adult medical examination without abnormal findings: Secondary | ICD-10-CM | POA: Diagnosis not present

## 2016-02-08 DIAGNOSIS — L719 Rosacea, unspecified: Secondary | ICD-10-CM | POA: Diagnosis not present

## 2016-02-08 DIAGNOSIS — Z01419 Encounter for gynecological examination (general) (routine) without abnormal findings: Secondary | ICD-10-CM | POA: Diagnosis not present

## 2016-02-08 DIAGNOSIS — Z124 Encounter for screening for malignant neoplasm of cervix: Secondary | ICD-10-CM | POA: Diagnosis not present

## 2016-02-09 LAB — CYTOLOGY - PAP: Diagnosis: NEGATIVE

## 2016-02-15 DIAGNOSIS — D2262 Melanocytic nevi of left upper limb, including shoulder: Secondary | ICD-10-CM | POA: Diagnosis not present

## 2016-02-15 DIAGNOSIS — D225 Melanocytic nevi of trunk: Secondary | ICD-10-CM | POA: Diagnosis not present

## 2016-02-15 DIAGNOSIS — L820 Inflamed seborrheic keratosis: Secondary | ICD-10-CM | POA: Diagnosis not present

## 2016-02-15 DIAGNOSIS — L579 Skin changes due to chronic exposure to nonionizing radiation, unspecified: Secondary | ICD-10-CM | POA: Diagnosis not present

## 2016-04-12 ENCOUNTER — Encounter (INDEPENDENT_AMBULATORY_CARE_PROVIDER_SITE_OTHER): Payer: 59 | Admitting: Family Medicine

## 2016-05-28 DIAGNOSIS — N952 Postmenopausal atrophic vaginitis: Secondary | ICD-10-CM | POA: Diagnosis not present

## 2016-06-12 MED FILL — FLUTICASONE PROP 50 MCG SPR: 50 | 60 days supply | Qty: 16 | Fill #0

## 2016-09-19 DIAGNOSIS — Z0100 Encounter for examination of eyes and vision without abnormal findings: Secondary | ICD-10-CM | POA: Diagnosis not present

## 2016-11-29 ENCOUNTER — Other Ambulatory Visit: Payer: Self-pay | Admitting: Family Medicine

## 2016-11-29 DIAGNOSIS — Z1231 Encounter for screening mammogram for malignant neoplasm of breast: Secondary | ICD-10-CM

## 2017-01-02 ENCOUNTER — Ambulatory Visit: Payer: 59

## 2017-01-25 ENCOUNTER — Ambulatory Visit
Admission: RE | Admit: 2017-01-25 | Discharge: 2017-01-25 | Disposition: A | Discharge status: Home / Self Care | Payer: 59 | Source: Ambulatory Visit | Attending: Family Medicine | Admitting: Family Medicine

## 2017-01-25 DIAGNOSIS — Z1231 Encounter for screening mammogram for malignant neoplasm of breast: Secondary | ICD-10-CM

## 2017-02-19 LAB — HM MAMMOGRAPHY: HM Mammogram: NORMAL (ref 0–4)

## 2017-02-26 DIAGNOSIS — L709 Acne, unspecified: Secondary | ICD-10-CM | POA: Diagnosis not present

## 2017-02-26 DIAGNOSIS — R0789 Other chest pain: Secondary | ICD-10-CM | POA: Diagnosis not present

## 2017-02-26 DIAGNOSIS — E7800 Pure hypercholesterolemia, unspecified: Secondary | ICD-10-CM | POA: Diagnosis not present

## 2017-02-26 DIAGNOSIS — J069 Acute upper respiratory infection, unspecified: Secondary | ICD-10-CM | POA: Diagnosis not present

## 2017-02-26 DIAGNOSIS — Z0000 Encounter for general adult medical examination without abnormal findings: Secondary | ICD-10-CM | POA: Diagnosis not present

## 2017-02-26 DIAGNOSIS — L719 Rosacea, unspecified: Secondary | ICD-10-CM | POA: Diagnosis not present

## 2017-02-26 DIAGNOSIS — G43909 Migraine, unspecified, not intractable, without status migrainosus: Secondary | ICD-10-CM | POA: Diagnosis not present

## 2017-09-19 ENCOUNTER — Encounter: Payer: Self-pay | Admitting: Family Medicine

## 2017-09-19 ENCOUNTER — Ambulatory Visit (INDEPENDENT_AMBULATORY_CARE_PROVIDER_SITE_OTHER): Payer: 59 | Admitting: Family Medicine

## 2017-09-19 DIAGNOSIS — Z681 Body mass index (BMI) 19 or less, adult: Secondary | ICD-10-CM | POA: Diagnosis not present

## 2017-09-19 DIAGNOSIS — E7800 Pure hypercholesterolemia, unspecified: Secondary | ICD-10-CM

## 2017-09-19 DIAGNOSIS — Z713 Dietary counseling and surveillance: Secondary | ICD-10-CM | POA: Diagnosis not present

## 2017-09-19 NOTE — Patient Instructions (Addendum)
-   The RDA for protein is 0.8 g/kg body weight.  Your weight of 135 today is ~61 kg, which would equal 49 g or protein.    - There is pretty good consensus that we need more protein as we get older, but even at that, recommendations rarely exceed 1 g per kg.    - Replace some of your meat with beans.  This will be helpful for cholesterol levels, and you already get plenty of protein, so don't need more from animal sources.    - If you eat fish/seafood more than twice a week, get more variety in than salmon.   - Breakfast, pre-workout:  Cut back veg's out entirely or in half, and limit soy milk to 1 cup pre-workout.    - Portion control:  Prepare portions that are appropriate for a single serving whether for a meal or for snacks.  This provides a "stopping point," and usually contributes to greater satisfaction from a smaller amount of food.    - Pay attention to your hunger levels:  Distinguish between physiological and psychological hunger, as well as the role of habit.  - HAALT: (over-)Hungry, Angry, Anxious, Lonely, Tired (& Bored & Depressed) - all are red flags for bad decisions.     Physical Hunger . Gradual Onset . Open to options . Hunger can wait . Stop when full . Feels satisfying  Emotional Hunger . Occurs suddenly . Specific food craving . Looking for instant satisfaction . Keep eating even when full . Results in feeling guilty

## 2017-09-19 NOTE — Progress Notes (Signed)
Medical Nutrition Therapy:  Appt start time: 1600 end time:  1700.  Assessment:  Primary concerns today: hyperlipidemia.  Tara [name]  is a pt of Tara Ada, MD.  Her LDL and total cholesterol have been increasing over the past four years, and Tara [name] has continued to recommend greater attention to diet and exercise.  This is frustrating to Tara [name], who feels she is doing all she can to manage both well.  Her BP has also been high (systolic as high as 962) on occasion, even at home.    We talked today about Tara [name]'s apparent uncertainty about how much she should eat.  She has taken a very cognitive approach to food choices, not eating intuitively at all.  She seems a bit confused about her hunger level as well.  She limits  intake to very healthful foods, but often doesn't know when to stop eating.    Learning Readiness: Change in progress  Usual eating pattern includes 3 meals and 3 snacks per day. Frequent foods and beverages include water, usually flavored with diet green tea (15 oz water, 4 oz flavored), fruit, veg's, poultry, seafood, Fairlife (high-protein) milk, plain nonfat [place] yogurt, lite cheese, nuts, beans, smoothie (28 g pro from powder, 1 banana, 4 oz milk).   Usual physical activity includes combination of 1-hr classes 5 X wk, usually 2-3 cardio, 1-2 strength, and 1 core/stretch, i.e., Pilates.    24-hr recall: (Up at 5 AM) B (6 AM)-   Water, 2 scrmbld eggs, 1 lb sauteed spinach in olive oil, 1 c mango, 16 oz soy milk  Worked out 6:30-7:30; protein shake ~7:30 (1 c soy milk, 1/2 banana, 1 c strawberries, 20 g pro pdr) Snk ( AM)-   1/4 c walnuts, 2 c strawberries, water L (12:30 PM)-  6 c salad (lots of veg's), 2 eggs, 4 oz salmon, 2 T olive oil, balsamic vin drsng, 2 c grapes & pineapple Snk (4 PM)-  2 c f-f Grk plain yogurt, sug-free choc syrup, 6-8 tbsp f-f peanut butter powder, 2 tbsp walnuts Snk (6 PM)-  Raw veg's (carrots, broc, mushrms, cel), 6 tbsp hummus  D (7 PM)-   4-6 c broc,mushrms,&onions w/ olive oil, 6 oz salmon, 1 swt potato, water Snk ( PM)-  Protein shake: 1 c soy milk, 1/2 banana, 1 c strawberries, 20 g pro pdr Typical day? No.  Is usually getting good variety; intentionally branching out on veg's   Progress Towards Goal(s):  In progress.   Nutritional Diagnosis:  NB-1.1 Food and nutrition-related knowledge deficit As related to best dietary regimen to optimize both health and satisfaction.  As evidenced by patient's expressed frustration at increasing lipid levels despite strong efforts at nutritional management.    Intervention:  Nutrition education  Handouts given during visit include:  After-Visit Summary (AVS)  Food Decisions Algorithm  Monitoring/Evaluation:  Dietary intake, exercise, and body weight in early December.

## 2017-09-20 DIAGNOSIS — H524 Presbyopia: Secondary | ICD-10-CM | POA: Diagnosis not present

## 2017-11-14 MED FILL — SHINGRIX 50 MCG SUS: 50 | 1 days supply | Qty: 1 | Fill #0

## 2017-11-25 DIAGNOSIS — M5416 Radiculopathy, lumbar region: Secondary | ICD-10-CM | POA: Diagnosis not present

## 2017-11-25 DIAGNOSIS — M25611 Stiffness of right shoulder, not elsewhere classified: Secondary | ICD-10-CM | POA: Diagnosis not present

## 2017-12-02 DIAGNOSIS — M25611 Stiffness of right shoulder, not elsewhere classified: Secondary | ICD-10-CM | POA: Diagnosis not present

## 2017-12-02 DIAGNOSIS — M5416 Radiculopathy, lumbar region: Secondary | ICD-10-CM | POA: Diagnosis not present

## 2017-12-13 ENCOUNTER — Other Ambulatory Visit: Payer: Self-pay | Admitting: Family Medicine

## 2017-12-13 DIAGNOSIS — Z1231 Encounter for screening mammogram for malignant neoplasm of breast: Secondary | ICD-10-CM

## 2018-01-09 MED FILL — CELECOXIB 200 MG CAP: 200 | 12 days supply | Qty: 24 | Fill #0

## 2018-01-09 MED FILL — CEPHALEXIN 500 MG CAPSULE: 500 | 1 days supply | Qty: 3 | Fill #0

## 2018-01-09 MED FILL — ONDANSETRON ODT 8 MG TABLET: 8 | 2 days supply | Qty: 6 | Fill #0

## 2018-01-09 MED FILL — traMADol HCL 50 MG TABS: 50 | 8 days supply | Qty: 24 | Fill #0

## 2018-01-20 MED FILL — SHINGRIX 50 MCG SUS: 50 | 1 days supply | Qty: 1 | Fill #1

## 2018-01-21 ENCOUNTER — Ambulatory Visit: Payer: 59 | Admitting: Family Medicine

## 2018-01-27 ENCOUNTER — Ambulatory Visit
Admission: RE | Admit: 2018-01-27 | Discharge: 2018-01-27 | Disposition: A | Discharge status: Home / Self Care | Payer: 59 | Source: Ambulatory Visit | Attending: Family Medicine | Admitting: Family Medicine

## 2018-01-27 DIAGNOSIS — Z1231 Encounter for screening mammogram for malignant neoplasm of breast: Secondary | ICD-10-CM | POA: Diagnosis not present

## 2018-01-29 ENCOUNTER — Other Ambulatory Visit: Payer: Self-pay | Admitting: Family Medicine

## 2018-01-29 DIAGNOSIS — R928 Other abnormal and inconclusive findings on diagnostic imaging of breast: Secondary | ICD-10-CM

## 2018-02-04 ENCOUNTER — Ambulatory Visit
Admission: RE | Admit: 2018-02-04 | Discharge: 2018-02-04 | Disposition: A | Discharge status: Home / Self Care | Payer: 59 | Source: Ambulatory Visit | Attending: Family Medicine | Admitting: Family Medicine

## 2018-02-04 DIAGNOSIS — R928 Other abnormal and inconclusive findings on diagnostic imaging of breast: Secondary | ICD-10-CM

## 2018-02-04 DIAGNOSIS — N6489 Other specified disorders of breast: Secondary | ICD-10-CM | POA: Diagnosis not present

## 2018-04-08 ENCOUNTER — Ambulatory Visit: Payer: 59 | Admitting: Family Medicine

## 2018-04-22 DIAGNOSIS — F5101 Primary insomnia: Secondary | ICD-10-CM | POA: Diagnosis not present

## 2018-04-22 DIAGNOSIS — E7800 Pure hypercholesterolemia, unspecified: Secondary | ICD-10-CM | POA: Diagnosis not present

## 2018-04-22 DIAGNOSIS — L719 Rosacea, unspecified: Secondary | ICD-10-CM | POA: Diagnosis not present

## 2018-04-22 DIAGNOSIS — Z1211 Encounter for screening for malignant neoplasm of colon: Secondary | ICD-10-CM | POA: Diagnosis not present

## 2018-04-22 DIAGNOSIS — Z0000 Encounter for general adult medical examination without abnormal findings: Secondary | ICD-10-CM | POA: Diagnosis not present

## 2018-04-22 LAB — CBC AND DIFFERENTIAL
HCT: 41 (ref 36–46)
Hemoglobin: 14.1 (ref 12.0–16.0)
Platelets: 229 (ref 150–399)
WBC: 6.9

## 2018-04-22 LAB — BASIC METABOLIC PANEL
BUN: 25 — AB (ref 4–21)
CO2: 28 — AB (ref 13–22)
Chloride: 98 — AB (ref 99–108)
Creatinine: 0.7 (ref 0.5–1.1)
Glucose: 85
Potassium: 4.4 (ref 3.4–5.3)
Sodium: 133 — AB (ref 137–147)

## 2018-04-22 LAB — HEPATIC FUNCTION PANEL
ALT: 25 (ref 7–35)
AST: 37 — AB (ref 13–35)
Alkaline Phosphatase: 56 (ref 25–125)
Bilirubin, Total: 0.4

## 2018-04-22 LAB — CBC: RBC: 4.16 (ref 3.87–5.11)

## 2018-04-22 LAB — TSH: TSH: 1.69 (ref 0.41–5.90)

## 2018-04-22 LAB — LIPID PANEL
Cholesterol: 200 (ref 0–200)
HDL: 79 — AB (ref 35–70)
LDL Cholesterol: 111
Triglycerides: 50 (ref 40–160)

## 2018-04-22 LAB — COMPREHENSIVE METABOLIC PANEL: Calcium: 9.8 (ref 8.7–10.7)

## 2018-04-22 MED FILL — traZODone HCL 50 MG TABS: 50 | 30 days supply | Qty: 30 | Fill #0

## 2018-04-24 DIAGNOSIS — Z1211 Encounter for screening for malignant neoplasm of colon: Secondary | ICD-10-CM | POA: Diagnosis not present

## 2018-04-26 LAB — FECAL OCCULT BLOOD, IMMUNOCHEMICAL: IFOBT: NEGATIVE

## 2018-05-27 ENCOUNTER — Ambulatory Visit: Payer: 59 | Admitting: Family Medicine

## 2018-07-08 ENCOUNTER — Ambulatory Visit: Payer: 59 | Admitting: Family Medicine

## 2018-08-12 ENCOUNTER — Other Ambulatory Visit: Payer: Self-pay

## 2018-08-12 ENCOUNTER — Ambulatory Visit (INDEPENDENT_AMBULATORY_CARE_PROVIDER_SITE_OTHER): Payer: 59 | Admitting: Family Medicine

## 2018-08-12 DIAGNOSIS — Z713 Dietary counseling and surveillance: Secondary | ICD-10-CM | POA: Diagnosis not present

## 2018-08-12 NOTE — Patient Instructions (Addendum)
Managing portion sizes: 1. Use smaller plates, bowls, glasses, and utensils (inlcuding serving utensils).  (Use tall, narrow glasses instead of short & wide.) 2. Slow eating.  Put fork down between bites.    I believe it's likely that you are sometimes eating for non-hunger reasons (including habit).  If you can determine what is driving your food choices, you will be more able to make different choices.  The goal is to be able to respond authentically to your needs rather than to react out of habit to how you feel - physically and/or emotionally.    When struggling with a food decision, ask yourself these three "decoding" questions:    1. What am I feeling right now?    2. What do I want to feel?    3. What do I truly need right now? Use the Feelings and Needs lists, and WRITE your answers.   You are looking for feelings, not thoughts.  Bring your responses to your follow-up appt.

## 2018-08-12 NOTE — Progress Notes (Signed)
Medical Nutrition Therapy  Assessment:  Primary concerns today: Weight management and lipids management without medication.  Also wants to improve sleep, minimize shoulder pain, mitigate hot flashes and blood pressure (which has been stable since 2018 when changed to low-Na diet).  Tara [name] feels frustrated that she cannot control her weight unless she exercises 2-3 X day.  Part of her concerns include difficulty managing appetite (portion control).    No weight check today.   Usual eating pattern: 3 meals and 3 snacks per day. Frequent foods and beverages: fresh fruit, veg's  (especially spinach & broccoli, edamame), at least 220 oz water/day, mjlk, unsweet tea, decaf coffee, alm milk, yogurt, beans, protein shake, seafood 2-3 X wk.   Avoided foods: red meat, (seldom eats poultry), .   Usual physical activity: 30-45 min 3 X day, 5 X wk; stretching only on weekends.  Usually Zoom classes early AM, after work, and MWF at noon.   Sleep: Estimates she is getting 3-5 1/2 hours/night.  Starts feeling especially tired at mid-afternoon, but also crashes early evening.  Sleeps better with more exercise.    24-hr recall: (Up at 5 AM; 45-min workout; water) B (8 AM)-   1 egg, 6 whites, 4 c spinach, 1/3 c mushrooms, onions, peppers, 16 oz alm milk, 1 c str'berries, 1 c decaf coffee, 2 tbsp alm milk, water Snk (11 AM)-   12-14 tbsp peanut butter powder, & cocoa, 2 c [place] yogurt 12 PM workout L (2 PM)-  Salad, 3/4 c edamame, 3/4 c blk beans, oil&vinegar, 1/4 avocado-walnut-apple sandwich, 1 c blkberries&pineapple, decaf w/ alm milk, water Snk (4 PM)-  1 pro shake: 1 c alm ,ilk, 2 tbsp soy pro pwdr (20 g pro), 1/2 banana, 1/2 c str'berries, water 5:30 workout  D (7:30 PM)-  6 oz salmon, 1 c pinto beans, 1-2 tbsp hummus, 4 c c roasted broc& cauliflr, 4 stalks celery, 1 c fruit (grapes, berries, kiwi, apple), 1 c alm milk Snk (9:45)-  2 c  c Grk yogurt, 1/4 c rolled oats, <1 tsp chia&flax seeds, 2 tsp cacao  nibs, water  Typical day? Yes.   Usually has a bit mre starchy foods at meals.    Nutritional Diagnosis:  NB-1.1 Food and nutrition-related knowledge deficit As related to best dietary regimen to optimize both health and satisfaction.  As evidenced by patient's frustration at inability to manage lipids and weight.    Handouts given during visit include:  After-Visit Summary (AVS)  List of Feelings & Needs  Demonstrated degree of understanding via:  Teach Back  Barriers to learning/adherence to lifestyle change: Compulsivity in eating and exercise.    Monitoring/Evaluation:  Dietary intake, exercise, and body weight in 6 week(s).

## 2018-09-25 ENCOUNTER — Ambulatory Visit (INDEPENDENT_AMBULATORY_CARE_PROVIDER_SITE_OTHER): Payer: 59 | Admitting: Family Medicine

## 2018-09-25 DIAGNOSIS — Z713 Dietary counseling and surveillance: Secondary | ICD-10-CM

## 2018-09-25 NOTE — Progress Notes (Signed)
Medical Nutrition Therapy  Assessment:  Primary concerns today: Weight management and lipids management without medication.  Tara [name] knows that she "works too hard" at eating right. She is tired of feeling food-preoccupied all the time.  She currently has a BMI of <20, but is very wary of weight gain b/c she feels that she has more shoulder pain when she gets above ~135 lb.  Tara [name] has recognized that she always wants to feel full after eating, and also suspects much of her night-time eating is to help her stay awake.  She continues to be unable to stay asleep for more than 3-3 1/2 hours per night, so doesn't want to go to bed too early even though she is usually exhausted.    Weight today:  Self-report of 135 lb.    Usual eating pattern: 3 meals and 3 snacks per day. Frequent foods and beverages: fresh fruit, veg's  (especially spinach & broccoli, edamame), at least 220 oz water/day, mjlk, unsweet tea, decaf coffee, alm milk, yogurt, beans, protein shake, seafood 2-3 X wk.   Avoided foods: red meat, (seldom eats poultry), .   Usual physical activity: 30-45 min 3 X day, 5 X wk; stretching only on weekends.  Usually Zoom classes early AM, after work, and MWF at noon.   Sleep: Estimates she is still getting only 3-5 1/2 hours/night.  Cannot get back to sleep after waking early.    24-hr recall:  (Up at 3 AM; laid in bed till 5) B (6:30 AM)-  2 eggs+3 egg whites, 3 c spinach (raw), sauted, 3/4 c other veg's, ! c grapes&str'berr's, 3 c Grk f-f yogurt Snk (9 AM)-  1 c coffee, 1 c almond milk, 14 tbsp (7 svngs) PB2 powder  - 12 PM: 45 min Zoom workout (moderate) -  L (1 PM)-  5 oz salmon, 4 c salad,1 tbsp oil, 1/2 c garbanzos, 1/4 c almonds, 1/4 avocado, 1 c blueberries, 1 c coffee, 1 c alm milk, 14 tbsp (7 svngs) PB2 powder Snk ( PM)-    - 5:30: Zoom Pilates class  D (6:30 PM)-  1 c blk beans, 1/4 c onions, grilled bread with f-f chs, tomatoes w/ spinach, 3 c Grk f-f yogurt - Snk ( PM)-  3 c Grk  f-f yogurt, 3 tbsp rolled oats Typical day? Yes.    Nutritional Diagnosis:  NB-1.1 Food and nutrition-related knowledge deficit As related to best dietary regimen to optimize both health and satisfaction.  As evidenced by patient's frustration at inability to manage lipids and weight.    Handouts given during visit include:  After-Visit Summary (AVS)   Demonstrated degree of understanding via:  Teach Back  Barriers to learning/adherence to lifestyle change: Compulsivity in eating and exercise.    Monitoring/Evaluation:  Dietary intake, exercise, and body weight in 12 week(s).

## 2018-09-25 NOTE — Patient Instructions (Addendum)
-   Sleep: Be very deliberate about your bedtime consistency, pushing it earlier by 15 minutes per week.  Do this for at least one, maybe two weeks before changing again.    - Include a source of healthy fat at each meal such as olive oil, avocado, or any nuts/seeds.  And feel free to add good fats to your snacks.   - Do what you can to reduce night time snack somewhat.  This may help you to sleep better.    - Make a list of possible non-food activities you could use as an alternative to snacking.    - Continue to use the 3 Decoding Qs when you're struggling with a food decision  (What do I feel?; What do I want to feel?; What do I truly need right now?).  Pay particular attention to how you feel emotionally, not just physically.    - Consider allowing yourself to gain a small amount of weight to see if your shoulder tolerates a bit more weight on you.  This may have a beneficial effect on your appetite mechanism.

## 2018-09-26 DIAGNOSIS — H524 Presbyopia: Secondary | ICD-10-CM | POA: Diagnosis not present

## 2018-12-19 ENCOUNTER — Encounter: Payer: Self-pay | Admitting: Internal Medicine

## 2018-12-19 ENCOUNTER — Telehealth: Payer: Self-pay

## 2018-12-19 NOTE — Telephone Encounter (Signed)
Ms. Debois called to request an appointment to establish care here with Dr. Mariea Clonts I spoke with Dr. Mariea Clonts and she said that this was fine as she had been in contact with Ms. Navarro via e-mail I made the appointment for January  as requested by Ms. Mise and called and left a message for her to make sure this day was acceptable  for her and had Judson Roch send out new patient packet

## 2019-01-01 ENCOUNTER — Telehealth: Payer: 59 | Admitting: Family Medicine

## 2019-02-04 ENCOUNTER — Other Ambulatory Visit: Payer: Self-pay | Admitting: Family Medicine

## 2019-02-04 ENCOUNTER — Other Ambulatory Visit: Payer: Self-pay | Admitting: Internal Medicine

## 2019-02-04 DIAGNOSIS — N63 Unspecified lump in unspecified breast: Secondary | ICD-10-CM

## 2019-02-19 ENCOUNTER — Encounter: Payer: Self-pay | Admitting: Internal Medicine

## 2019-02-23 ENCOUNTER — Other Ambulatory Visit: Payer: Self-pay

## 2019-02-23 ENCOUNTER — Ambulatory Visit (INDEPENDENT_AMBULATORY_CARE_PROVIDER_SITE_OTHER): Payer: No Typology Code available for payment source | Admitting: Internal Medicine

## 2019-02-23 ENCOUNTER — Encounter: Payer: Self-pay | Admitting: Internal Medicine

## 2019-02-23 VITALS — BP 106/60 | HR 65 | Temp 97.7°F | Ht 70.0 in | Wt 135.0 lb

## 2019-02-23 DIAGNOSIS — I341 Nonrheumatic mitral (valve) prolapse: Secondary | ICD-10-CM

## 2019-02-23 DIAGNOSIS — L700 Acne vulgaris: Secondary | ICD-10-CM | POA: Diagnosis not present

## 2019-02-23 DIAGNOSIS — E7800 Pure hypercholesterolemia, unspecified: Secondary | ICD-10-CM | POA: Diagnosis not present

## 2019-02-23 DIAGNOSIS — M150 Primary generalized (osteo)arthritis: Secondary | ICD-10-CM | POA: Insufficient documentation

## 2019-02-23 DIAGNOSIS — M8949 Other hypertrophic osteoarthropathy, multiple sites: Secondary | ICD-10-CM

## 2019-02-23 DIAGNOSIS — M21611 Bunion of right foot: Secondary | ICD-10-CM

## 2019-02-23 DIAGNOSIS — M19212 Secondary osteoarthritis, left shoulder: Secondary | ICD-10-CM | POA: Insufficient documentation

## 2019-02-23 DIAGNOSIS — M159 Polyosteoarthritis, unspecified: Secondary | ICD-10-CM | POA: Insufficient documentation

## 2019-02-23 DIAGNOSIS — D229 Melanocytic nevi, unspecified: Secondary | ICD-10-CM | POA: Insufficient documentation

## 2019-02-23 DIAGNOSIS — I8311 Varicose veins of right lower extremity with inflammation: Secondary | ICD-10-CM | POA: Insufficient documentation

## 2019-02-23 DIAGNOSIS — G4709 Other insomnia: Secondary | ICD-10-CM | POA: Insufficient documentation

## 2019-02-23 DIAGNOSIS — R232 Flushing: Secondary | ICD-10-CM

## 2019-02-23 DIAGNOSIS — I8312 Varicose veins of left lower extremity with inflammation: Secondary | ICD-10-CM

## 2019-02-23 DIAGNOSIS — Z114 Encounter for screening for human immunodeficiency virus [HIV]: Secondary | ICD-10-CM

## 2019-02-23 DIAGNOSIS — Z1159 Encounter for screening for other viral diseases: Secondary | ICD-10-CM

## 2019-02-23 DIAGNOSIS — M8580 Other specified disorders of bone density and structure, unspecified site: Secondary | ICD-10-CM | POA: Insufficient documentation

## 2019-02-23 MED ORDER — TRETINOIN 0.05 % EX CREA: 1.0000 "application " | TOPICAL_CREAM | Freq: Every day | CUTANEOUS | 3 refills | Status: DC

## 2019-02-23 NOTE — Progress Notes (Signed)
Location:  [facility] clinic  Provider: Dr. Hollace Kinnier  Goals of Care:  Advanced Directives 02/23/2019  Does Patient Have a Medical Advance Directive? Yes  Type of Paramedic of [place];Living will  Does patient want to make changes to medical advance directive? No - Patient declined  Copy of [place] in Chart? Yes - validated most recent copy scanned in chart (See row information)  Pre-existing out of facility DNR order (yellow form or pink MOST form) -     Chief Complaint  Patient presents with  . Establish Care    New Patient    HPI: Patient is a 59 y.o. female seen today for establishment of new patient.   She is establishing care as a new patient due to her provider being close to retirement.   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   Family history of elevated cholesterol. She began having issues when menopause started a few years ago. She has a personal goal to not take statin medication. Has been taking beta-sitosterol supplement for a year. Also Follows a lean/ mediterraninan diet. Exercises 5 times a week fluctuating between cardio workouts and strengthning exercises.  Last lipid panel March 2020. Requesting for her lipid panel to be rechecked in March 2021.   Arthritis occurs daily. Complains of bilateral shoulder pain and right knee pain. Her main complaint is left shoulder pain and is unable to touch her head due to the pain. Has seen a rheumatologist to r/o RA and OA in the past. Right shoulder surgery in 2014 by Dr. Onnie Graham. Will take tylenol PRN for arthritic pain. Has seen integrative therapies for PT in past, she is interested in a referral there for left shoulder pain. Requesting referral to see Dr. Onnie Graham about her left shoulder pain.   Has had insomnia since menopause. Averages about 3 hours of sleep a night or less. Complains of excessive daytime sleepiness. She has not been tested for sleep apnea. Not interested  in sleeping aids. Uses aromatherapy to help her sleep.   Has been in menopause since 2015. Takes black cohosh to help with hot flashes. Thinking about weaning supplement.   Diagnosed with mitral valve prolapse in 2012. Was seen by Dr. Angelena Form. Has not been seen by cardiology in years. Sometimes get dizzy when bending over. Denies any other symptoms.   Has seasonal allergies to pollen. Will take zyrtec and flonase PRN when this occurs.   Scheduled to have mammogram at the end of this month.   Colonoscopy was 6 years ago. Exam was unremarkable. Recommended she return in 10 years.   Last dental cleaning was December 2020.   Last eye exam June 2020.   Last PAP 2017.   Referrals: Patient requesting them: Dr. Onnie Graham- left shoulder pain [facility] - left shouler pain Dr. Rozann Lesches- dermatology- questionable moles scattered throughout body Dr. Arnetha Courser- right foot 3rd and 4th toes shifting, right foot bunion Dr. Trudie Reed- rheumatologist to rule out RA/OA Dr. Lemmie Evens- vein clinic- out of [place], [place] of RetinA prescription- would like refill           Past Medical History:  Diagnosis Date  . Arthritis   . Hyperlipidemia   . Insomnia   . Mitral valve prolapse     Past Surgical History:  Procedure Laterality Date  . [place]   right  . SHOULDER ARTHROSCOPY WITH LABRAL REPAIR Right 08/28/2012   Procedure: RIGHT SHOULDER ARTHROSCOPY WITH LABRAL  DEBRIDEMENT, ASPIRATION AND DECOMPRESSION OF PARALABRAL CYST;  Surgeon: Marin Shutter, MD;  Location: [facility];  Service: Orthopedics;  Laterality: Right;    Allergies  Allergen Reactions  . Codeine Nausea And Vomiting  . Hydrocodone Nausea And Vomiting  . Triple Antibiotic Pain Relief [Neomy-Bacit-Polymyx-Pramoxine] Other (See Comments)    Burn    Outpatient Encounter Medications as of 02/23/2019  Medication Sig  . acetaminophen (TYLENOL 8 HOUR) 650 MG CR tablet Take 650 mg by mouth every 8 (eight) hours as  needed for pain.  Marland Kitchen BLACK COHOSH EXTRACT PO Take by mouth.  . Calcium Citrate 250 MG TABS Take by mouth.  . cetirizine (ZYRTEC) 10 MG tablet Take 10 mg by mouth daily.  . Cholecalciferol (VITAMIN D PO) Take by mouth.  . fluticasone (FLONASE) 50 MCG/ACT nasal spray Place 1 spray into both nostrils 3 (three) times daily.  . Magnesium 100 MG CAPS Take 150 mg by mouth.  . Misc Natural Products (BETA-SITOSTEROL PLANT STEROLS PO) Take 375 mg by mouth.  . Misc Natural Products (TART CHERRY ADVANCED PO) Take by mouth.  . Multiple Vitamin (MULTIVITAMIN WITH MINERALS) TABS Take 1 tablet by mouth daily.  . Omega-3 Fatty Acids (FISH OIL) 1000 MG CAPS Take 1 capsule by mouth 2 (two) times daily.   Marland Kitchen tretinoin (RETIN-A) 0.05 % cream Apply 1 application topically at bedtime.  Marland Kitchen UNABLE TO FIND Med Name: Essential Oils.  Marland Kitchen VALERIAN ROOT PO Take 800 mg by mouth.   No facility-administered encounter medications on file as of 02/23/2019.    Review of Systems:  Review of Systems  Constitutional: Positive for fatigue. Negative for activity change and appetite change.       Hot flashes  HENT: Negative for hearing loss and trouble swallowing.   Eyes:       Contact lenses, dry eyes  Respiratory: Negative for cough and shortness of breath.   Cardiovascular: Negative for chest pain, palpitations and leg swelling.  Gastrointestinal: Negative for abdominal pain, constipation, diarrhea and nausea.  Endocrine: Negative for polydipsia, polyphagia and polyuria.  Genitourinary: Negative for dysuria and hematuria.  Musculoskeletal: Positive for arthralgias.       Bilateral shoulder pain, right knee pain  Skin:       Scattered moles throughout body  Allergic/Immunologic: Positive for environmental allergies.  Neurological: Negative for dizziness and headaches.  Psychiatric/Behavioral: Positive for sleep disturbance. Negative for dysphoric mood. The patient is not nervous/anxious.   All other systems reviewed and are  negative.   Health Maintenance  Topic Date Due  . Hepatitis C Screening  03/30/1960  . HIV Screening  08/18/1976  . PAP SMEAR-Modifier  02/08/2019  . MAMMOGRAM  01/28/2020  . COLONOSCOPY  02/19/2022  . TETANUS/TDAP  02/20/2025  . INFLUENZA VACCINE  Completed    Physical Exam: Vitals:   02/23/19 1335  BP: 106/60  Pulse: 65  Temp: 97.7 F (36.5 C)  TempSrc: Oral  SpO2: 99%  Weight: 135 lb (61.2 kg)  Height: 5\' 10"  (1.778 m)   Body mass index is 19.37 kg/m. Physical Exam Constitutional:      General: She is not in acute distress.    Appearance: Normal appearance. She is normal weight.  HENT:     Head: Normocephalic.  Neck:     Thyroid: No thyroid mass, thyromegaly or thyroid tenderness.  Cardiovascular:     Rate and Rhythm: Normal rate and regular rhythm.     Pulses: Normal pulses.     Heart sounds: Normal  heart sounds. No murmur.     Comments: Click noted during auscultation Pulmonary:     Effort: Pulmonary effort is normal. No respiratory distress.     Breath sounds: Normal breath sounds. No wheezing.  Abdominal:     General: Abdomen is flat. Bowel sounds are normal.     Palpations: Abdomen is soft.  Musculoskeletal:        General: No swelling or deformity. Normal range of motion.     Right lower leg: No edema.     Left lower leg: No edema.  Skin:    General: Skin is warm and dry.     Capillary Refill: Capillary refill takes less than 2 seconds.  Neurological:     General: No focal deficit present.     Mental Status: She is alert and oriented to person, place, and time. Mental status is at baseline.  Psychiatric:        Mood and Affect: Mood normal.        Behavior: Behavior normal.        Thought Content: Thought content normal.        Judgment: Judgment normal.     Labs reviewed: Basic Metabolic Panel: No results for input(s): NA, K, CL, CO2, GLUCOSE, BUN, CREATININE, CALCIUM, MG, PHOS, TSH in the last 8760 hours. Liver Function Tests: No results  for input(s): AST, ALT, ALKPHOS, BILITOT, PROT, ALBUMIN in the last 8760 hours. No results for input(s): LIPASE, AMYLASE in the last 8760 hours. No results for input(s): AMMONIA in the last 8760 hours. CBC: No results for input(s): WBC, NEUTROABS, HGB, HCT, MCV, PLT in the last 8760 hours. Lipid Panel: No results for input(s): CHOL, HDL, LDLCALC, TRIG, CHOLHDL, LDLDIRECT in the last 8760 hours. No results found for: HGBA1C  Procedures since last visit: No results found.  Assessment/Plan 1. Pure hypercholesterolemia - continue low fat/ mediterranean diet - continue current exercise regimen - lipid panel- 2 months  2. Cycstic acne vulgaris - uses Retin-A , continue current dosage - limit sun exposure while using Retin-A, apply sunscreen with SPF daily - refrain from using facial products that contain oil - dermatology referral  3. Primary osteoarthritis involving multiple joints - stable at this time - continue to use tylenol PRN - orthopedic referral  4. Secondary osteoarthritis of left shoulder due to rotator cuff arthopathy - suspect shoulder impingement or rotator cuff tear - continue tylenol PRN - referral to Dr. Onnie Graham at [facility]  5. Other insomnia - suspect hot flashes are preventing sleep - she is refusing to try sleeping medication at this time - would like to r/o sleep apnea and have sleeping study scheduled, patient will consider this after hot flashes have improved  6. Hot flashes - continue current use of black cohosh  7. Osteopenia, unspecified location - last DEXA scan 2017 - continue current regimen of calcium citrate and vitamin D - continue weight bearing exercises   8. Bunion of right foot -only able to wear tennis shoes, due to bunion discomfort - referral to podiatry  9. Varicose 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 - history of varicose veins in past with surgery  - she complains of left shin pain at times - referral to  vascular sugery - continue to use compression stocking daily  10. Multiple atypical nevi - referral to dermatology   11. Encounter for hepatitis C screening test for low risk patient - Hep C antibody- 2 months  12. Screening for HIV (human immunodeficiency virus) -  HIV antibody (with reflex)- 2 months   Labs/tests ordered:  CBC with differential/platelets, CMP with GFR, Hep C antibody, HIV antibody, lipid panel Next appt:  Annual wellness visit in March 2021

## 2019-02-23 NOTE — Patient Instructions (Signed)
Pleasure officially meeting you today!

## 2019-02-24 NOTE — Addendum Note (Signed)
Addended by: Hollace Kinnier L on: 02/24/2019 11:26 AM   Modules accepted: Orders

## 2019-02-25 ENCOUNTER — Encounter: Payer: Self-pay | Admitting: Internal Medicine

## 2019-02-26 ENCOUNTER — Encounter: Payer: Self-pay | Admitting: *Deleted

## 2019-03-03 ENCOUNTER — Other Ambulatory Visit: Payer: Self-pay

## 2019-03-03 ENCOUNTER — Ambulatory Visit: Payer: No Typology Code available for payment source | Admitting: Podiatry

## 2019-03-03 ENCOUNTER — Ambulatory Visit (INDEPENDENT_AMBULATORY_CARE_PROVIDER_SITE_OTHER): Payer: No Typology Code available for payment source

## 2019-03-03 ENCOUNTER — Encounter: Payer: Self-pay | Admitting: Podiatry

## 2019-03-03 DIAGNOSIS — M2041 Other hammer toe(s) (acquired), right foot: Secondary | ICD-10-CM | POA: Diagnosis not present

## 2019-03-03 DIAGNOSIS — M79671 Pain in right foot: Secondary | ICD-10-CM | POA: Diagnosis not present

## 2019-03-03 DIAGNOSIS — M779 Enthesopathy, unspecified: Secondary | ICD-10-CM | POA: Diagnosis not present

## 2019-03-03 DIAGNOSIS — M21619 Bunion of unspecified foot: Secondary | ICD-10-CM

## 2019-03-03 DIAGNOSIS — M2042 Other hammer toe(s) (acquired), left foot: Secondary | ICD-10-CM

## 2019-03-03 NOTE — Patient Instructions (Signed)

## 2019-03-04 ENCOUNTER — Encounter: Payer: Self-pay | Admitting: Podiatry

## 2019-03-04 ENCOUNTER — Encounter: Payer: Self-pay | Admitting: Internal Medicine

## 2019-03-04 NOTE — Telephone Encounter (Addendum)
Pt called states the pad that was given fits on a toe and like a brow beneath the toes, but when she walks it feels like she is walking on a board and is very painful. I asked pt if she had the thick part of the crest pad near the big toe and the narrow near the little toe and she stated she did. I asked pt the nature of her original pain and she stated the right 3, 4th toes have arthritis and Dr. Jacqualyn Posey wanted to see if the pad would decrease the pain and the possibility of decreasing the possibility of contracting toes in the area. Pt states Dr. Jacqualyn Posey gave her plantar fascial exercises but she went into MyChart and there was no other information concerning her appt. I told pt I would forward her message to Dr. Jacqualyn Posey, and either his assistant would contact her today or I would call again tomorrow, to stop the pad at this time and decrease activity to allow the area to rest.

## 2019-03-04 NOTE — Telephone Encounter (Signed)
Left message for pt to call to discuss the type of toe cushion that was dispensed and how to wear.

## 2019-03-06 ENCOUNTER — Encounter: Payer: Self-pay | Admitting: Internal Medicine

## 2019-03-06 NOTE — Progress Notes (Signed)
Subjective:   Patient ID: Tara [name], female   DOB: 59 y.o.   MRN: [id number]   HPI 59 year old female presents the office today for concerns of pain to her right foot mostly on the third and fourth digit was been under the last couple of months.  She states that when she would have supportive shoes she does not have this much pain however when she goes barefoot or wear the shoe without much support when she has majority discomfort to the toes.  She states it painful to walk at times.  The toes have been changing position.  No recent injury.  She does have arthritis in multiple other joints.  No other concerns.   Review of Systems  All other systems reviewed and are negative.  Past Medical History:  Diagnosis Date  . Arthritis   . Hyperlipidemia   . Insomnia   . Mitral valve prolapse     Past Surgical History:  Procedure Laterality Date  . [place]   right  . SHOULDER ARTHROSCOPY WITH LABRAL REPAIR Right 08/28/2012   Procedure: RIGHT SHOULDER ARTHROSCOPY WITH LABRAL DEBRIDEMENT, ASPIRATION AND DECOMPRESSION OF PARALABRAL CYST;  Surgeon: Marin Shutter, MD;  Location: [facility];  Service: Orthopedics;  Laterality: Right;     Current Outpatient Medications:  .  acetaminophen (TYLENOL 8 HOUR) 650 MG CR tablet, Take 650 mg by mouth every 8 (eight) hours as needed for pain., Disp: , Rfl:  .  BLACK COHOSH EXTRACT PO, Take by mouth., Disp: , Rfl:  .  Calcium Citrate 250 MG TABS, Take by mouth., Disp: , Rfl:  .  cetirizine (ZYRTEC) 10 MG tablet, Take 10 mg by mouth daily., Disp: , Rfl:  .  Cholecalciferol (VITAMIN D PO), Take by mouth., Disp: , Rfl:  .  fluticasone (FLONASE) 50 MCG/ACT nasal spray, Place 1 spray into both nostrils 3 (three) times daily., Disp: , Rfl:  .  Magnesium 100 MG CAPS, Take 150 mg by mouth., Disp: , Rfl:  .  Misc Natural Products (BETA-SITOSTEROL PLANT STEROLS PO), Take 375 mg by mouth., Disp: , Rfl:  .  Misc Natural Products (TART CHERRY ADVANCED  PO), Take by mouth., Disp: , Rfl:  .  Multiple Vitamin (MULTIVITAMIN WITH MINERALS) TABS, Take 1 tablet by mouth daily., Disp: , Rfl:  .  Omega-3 Fatty Acids (FISH OIL) 1000 MG CAPS, Take 1 capsule by mouth 2 (two) times daily. , Disp: , Rfl:  .  tretinoin (RETIN-A) 0.05 % cream, Apply 1 application topically at bedtime., Disp: 45 g, Rfl: 3 .  UNABLE TO FIND, Med Name: Essential Oils., Disp: , Rfl:  .  VALERIAN ROOT PO, Take 800 mg by mouth., Disp: , Rfl:   Allergies  Allergen Reactions  . Codeine Nausea And Vomiting  . Hydrocodone Nausea And Vomiting  . Triple Antibiotic Pain Relief [Neomy-Bacit-Polymyx-Pramoxine] Other (See Comments)    Burn          Objective:  Physical Exam  General: AAO x3, NAD  Dermatological: Skin is warm, dry and supple bilateral. Nails x 10 are well manicured; remaining integument appears unremarkable at this time. There are no open sores, no preulcerative lesions, no rash or signs of infection present.  Vascular: Dorsalis Pedis artery and Posterior Tibial artery pedal pulses are 2/4 bilateral with immedate capillary fill time.  There is no pain with calf compression, swelling, warmth, erythema.   Neruologic: Grossly intact via light touch bilateral.   Musculoskeletal: Mild bunion present.  Hammertoe contractures are present with the right side worse mostly to get on the third and fourth digits.  They are flexible for the most part but started become semirigid to the third and fourth digits.  There is no pain to the MPJs or metatarsals or other areas of the Muscular strength 5/5 in all groups tested bilateral.  Gait: Unassisted, Nonantalgic.       Assessment:   Hammertoe contractures-symptomatic     Plan:  -Treatment options discussed including all alternatives, risks, and complications -Etiology of symptoms were discussed -X-rays were obtained and reviewed with the patient.  Hammertoes are present with as well as mild bunion.  There is no evidence  of acute fracture identified. -We discussed stretching, strengthening exercises for digits.  I dispensed a toe crest.  We discussed supportive shoes.  Hopefully with the toe splints as well as the toe exercises this will help the progression and also help with her discomfort.  Trula Slade DPM

## 2019-03-10 ENCOUNTER — Telehealth: Payer: Self-pay | Admitting: *Deleted

## 2019-03-10 ENCOUNTER — Ambulatory Visit: Payer: 59 | Admitting: Family Medicine

## 2019-03-10 MED FILL — TRETINOIN 0.05 % CREA: 0.05 | 30 days supply | Qty: 45 | Fill #0

## 2019-03-10 MED FILL — TRI-LUMA CREAM: 0.01-4-0.05 | 30 days supply | Qty: 30 | Fill #0

## 2019-03-10 NOTE — Telephone Encounter (Signed)
Called and left a message for the patient that Dr Jacqualyn Posey got some pads and I stated that I was not in the [place] office today and may not be tomorrow but would be back Thursday hopefully and stated that Dr Jacqualyn Posey was in the [place] office today. Lattie Haw

## 2019-03-11 ENCOUNTER — Other Ambulatory Visit: Payer: Self-pay | Admitting: Internal Medicine

## 2019-03-11 ENCOUNTER — Ambulatory Visit
Admission: RE | Admit: 2019-03-11 | Discharge: 2019-03-11 | Disposition: A | Discharge status: Home / Self Care | Payer: No Typology Code available for payment source | Source: Ambulatory Visit | Attending: Internal Medicine | Admitting: Internal Medicine

## 2019-03-11 ENCOUNTER — Other Ambulatory Visit: Payer: Self-pay

## 2019-03-11 ENCOUNTER — Ambulatory Visit: Payer: 59

## 2019-03-11 DIAGNOSIS — N63 Unspecified lump in unspecified breast: Secondary | ICD-10-CM

## 2019-03-18 MED FILL — PENICILLIN VK 500 MG TABLET: 500 | 7 days supply | Qty: 28 | Fill #0

## 2019-03-19 ENCOUNTER — Other Ambulatory Visit: Payer: Self-pay | Admitting: Orthopedic Surgery

## 2019-03-19 DIAGNOSIS — M25511 Pain in right shoulder: Secondary | ICD-10-CM

## 2019-03-25 ENCOUNTER — Encounter: Payer: Self-pay | Admitting: Internal Medicine

## 2019-03-25 ENCOUNTER — Encounter: Payer: Self-pay | Admitting: Podiatry

## 2019-04-01 ENCOUNTER — Other Ambulatory Visit: Payer: No Typology Code available for payment source

## 2019-04-01 ENCOUNTER — Other Ambulatory Visit: Payer: Self-pay

## 2019-04-01 ENCOUNTER — Ambulatory Visit
Admission: RE | Admit: 2019-04-01 | Discharge: 2019-04-01 | Disposition: A | Discharge status: Home / Self Care | Payer: No Typology Code available for payment source | Source: Ambulatory Visit | Attending: Orthopedic Surgery | Admitting: Orthopedic Surgery

## 2019-04-01 DIAGNOSIS — M25511 Pain in right shoulder: Secondary | ICD-10-CM

## 2019-04-20 ENCOUNTER — Other Ambulatory Visit: Payer: No Typology Code available for payment source

## 2019-04-21 ENCOUNTER — Other Ambulatory Visit: Payer: Self-pay

## 2019-04-21 ENCOUNTER — Other Ambulatory Visit: Payer: No Typology Code available for payment source

## 2019-04-21 DIAGNOSIS — M159 Polyosteoarthritis, unspecified: Secondary | ICD-10-CM

## 2019-04-21 DIAGNOSIS — E7800 Pure hypercholesterolemia, unspecified: Secondary | ICD-10-CM

## 2019-04-21 DIAGNOSIS — I341 Nonrheumatic mitral (valve) prolapse: Secondary | ICD-10-CM

## 2019-04-21 DIAGNOSIS — Z1159 Encounter for screening for other viral diseases: Secondary | ICD-10-CM

## 2019-04-21 DIAGNOSIS — Z114 Encounter for screening for human immunodeficiency virus [HIV]: Secondary | ICD-10-CM

## 2019-04-22 NOTE — Progress Notes (Signed)
Inflammatory markers are both totally normal. Blood counts, electrolytes and kidneys are normal. One transaminase is 1 point above normal.  I see where last time she had labs, the other one was elevated by just 2 points.  I wonder if this is related to her supplements?   Cholesterol is a little bit above goal at 107 with goal less than 100.   HIV test is negative.

## 2019-04-23 ENCOUNTER — Encounter: Payer: Self-pay | Admitting: Internal Medicine

## 2019-04-23 ENCOUNTER — Other Ambulatory Visit: Payer: Self-pay

## 2019-04-23 ENCOUNTER — Ambulatory Visit (INDEPENDENT_AMBULATORY_CARE_PROVIDER_SITE_OTHER): Payer: No Typology Code available for payment source | Admitting: Internal Medicine

## 2019-04-23 VITALS — BP 110/62 | HR 62 | Temp 97.5°F | Ht 70.0 in | Wt 138.0 lb

## 2019-04-23 DIAGNOSIS — Z0000 Encounter for general adult medical examination without abnormal findings: Secondary | ICD-10-CM

## 2019-04-23 DIAGNOSIS — M8580 Other specified disorders of bone density and structure, unspecified site: Secondary | ICD-10-CM

## 2019-04-23 DIAGNOSIS — M159 Polyosteoarthritis, unspecified: Secondary | ICD-10-CM

## 2019-04-23 DIAGNOSIS — M8949 Other hypertrophic osteoarthropathy, multiple sites: Secondary | ICD-10-CM

## 2019-04-23 DIAGNOSIS — R232 Flushing: Secondary | ICD-10-CM

## 2019-04-23 DIAGNOSIS — I341 Nonrheumatic mitral (valve) prolapse: Secondary | ICD-10-CM

## 2019-04-23 DIAGNOSIS — G4709 Other insomnia: Secondary | ICD-10-CM

## 2019-04-23 DIAGNOSIS — E7800 Pure hypercholesterolemia, unspecified: Secondary | ICD-10-CM

## 2019-04-23 NOTE — Progress Notes (Signed)
Location:  [facility] Provider:  Garrell Flagg L. Mariea Clonts, D.O., C.M.D.  Goals of Care:  Advanced Directives 04/23/2019  Does Patient Have a Medical Advance Directive? Yes  Type of Paramedic of [place];Living will  Does patient want to make changes to medical advance directive? No - Patient declined  Copy of [place] in Chart? -  Pre-existing out of facility DNR order (yellow form or pink MOST form) -   Chief Complaint  Patient presents with  . Annual Exam    annual physical exam     HPI: Patient is a 59 y.o. female seen today for annual exam.    Sees dietitian already to keep her cholesterol down.  Takes the beta-sitosterol.  Has only 3 fats per day.  Has an extensive exercise program in place.  She is not ready for a shoulder replacement.  Upset she cannot reach up to do her hair.  PT is helpful and the exercise.  She is so sore if she does not exercise.   She is good with her antiinflammatory diet.  Does no fat, no sugar greek yogurt for protein, unsweetened almond milk.    Has toe crest on for hammertoes.  Sees Dr. Jacqualyn Posey.  Losing strength in hands.  Hips pop in and out and right knee bothers her. Does the tart cherry.  Used to take turmeric but didn't work as well.  She did start back glucosamine-chondroitin but not sure if it's really helping.    She agrees to attempt to back off tart cherry to see how that goes so her supplement list does not keep growing.  Switched to 15000 IU weekly D2.  She thinks her mental processing has slowed and she's tired at night.  Feels like it started with menopause.  She even feels like she's moving slower at night.  Wonders how much is pain and difficulty with sleep.  She increased mag citrate and valerian root and was going to add passion fruit, but wanted to discuss with me first.  She does the sleep hygiene regimen.  Gets enough exercise.    We reviewed very slight transaminase elevations and slight  increased BUN.  Hydration emphasized which she is already good about doing.  Past Medical History:  Diagnosis Date  . Arthritis   . Hyperlipidemia   . Insomnia   . Mitral valve prolapse     Past Surgical History:  Procedure Laterality Date  . AUGMENTATION MAMMAPLASTY Bilateral    Dec 2019  . BREAST BIOPSY Right    axillary node benign  . [place]   right  . SHOULDER ARTHROSCOPY WITH LABRAL REPAIR Right 08/28/2012   Procedure: RIGHT SHOULDER ARTHROSCOPY WITH LABRAL DEBRIDEMENT, ASPIRATION AND DECOMPRESSION OF PARALABRAL CYST;  Surgeon: Marin Shutter, MD;  Location: [facility];  Service: Orthopedics;  Laterality: Right;    Allergies  Allergen Reactions  . Codeine Nausea And Vomiting  . Hydrocodone Nausea And Vomiting  . Triple Antibiotic Pain Relief [Neomy-Bacit-Polymyx-Pramoxine] Other (See Comments)    Burn  . Triple Antibiotic W/Hydrocortisone  [Bacitra-Neomycin-Polymyxin-Hc]     Outpatient Encounter Medications as of 04/23/2019  Medication Sig  . acetaminophen (TYLENOL 8 HOUR) 650 MG CR tablet Take 650 mg by mouth every 8 (eight) hours as needed for pain.  Marland Kitchen BLACK COHOSH EXTRACT PO Take by mouth.  . Calcium Citrate 250 MG TABS Take by mouth.  . cetirizine (ZYRTEC) 10 MG tablet Take 10 mg by mouth daily.  Marland Kitchen  Cholecalciferol (VITAMIN D PO) Take by mouth.  . fluticasone (FLONASE) 50 MCG/ACT nasal spray Place 1 spray into both nostrils 3 (three) times daily.  . Magnesium 100 MG CAPS Take 150 mg by mouth.  . Misc Natural Products (BETA-SITOSTEROL PLANT STEROLS PO) Take 375 mg by mouth.  . Misc Natural Products (TART CHERRY ADVANCED PO) Take by mouth.  . Misc Natural Products (TART CHERRY ADVANCED PO) Tart Cherry  3,600 mg daily  . Multiple Vitamin (MULTIVITAMIN WITH MINERALS) TABS Take 1 tablet by mouth daily.  . Omega-3 Fatty Acids (FISH OIL) 1000 MG CAPS Take 1 capsule by mouth 2 (two) times daily.   Marland Kitchen tretinoin (RETIN-A) 0.05 % cream Apply 1 application topically at  bedtime.  Marland Kitchen UNABLE TO FIND Med Name: Essential Oils.  Marland Kitchen VALERIAN ROOT PO Take 800 mg by mouth.   No facility-administered encounter medications on file as of 04/23/2019.    Review of Systems:  Review of Systems  Constitutional: Positive for malaise/fatigue. Negative for chills and fever.  HENT: Negative for hearing loss.   Eyes: Negative for blurred vision.  Respiratory: Negative for cough and shortness of breath.   Cardiovascular: Negative for chest pain, palpitations and leg swelling.  Gastrointestinal: Negative for abdominal pain, blood in stool, constipation, diarrhea and melena.  Genitourinary: Negative for dysuria.  Musculoskeletal: Positive for joint pain. Negative for falls and myalgias.  Neurological: Negative for dizziness and loss of consciousness.  Psychiatric/Behavioral: Negative for depression and memory loss. The patient has insomnia. The patient is not nervous/anxious.     Health Maintenance  Topic Date Due  . Hepatitis C Screening  07/04/1960  . DEXA SCAN  02/19/2017  . PAP SMEAR-Modifier  02/08/2019  . MAMMOGRAM  03/10/2021  . COLONOSCOPY  02/19/2022  . TETANUS/TDAP  02/20/2025  . INFLUENZA VACCINE  Completed  . HIV Screening  Completed    Physical Exam: Vitals:   04/23/19 1328  Weight: 138 lb (62.6 kg)   Body mass index is 19.8 kg/m. Physical Exam Vitals reviewed.  Constitutional:      General: She is not in acute distress.    Appearance: Normal appearance. She is not ill-appearing or toxic-appearing.  HENT:     Head: Normocephalic and atraumatic.     Right Ear: Tympanic membrane, ear canal and external ear normal.     Left Ear: Tympanic membrane, ear canal and external ear normal.     Nose: Nose normal.     Mouth/Throat:     Pharynx: Oropharynx is clear.  Eyes:     Extraocular Movements: Extraocular movements intact.     Conjunctiva/sclera: Conjunctivae normal.     Pupils: Pupils are equal, round, and reactive to light.  Cardiovascular:      Rate and Rhythm: Normal rate and regular rhythm.     Pulses: Normal pulses.     Heart sounds: Normal heart sounds.     Comments: Midsystolic click Pulmonary:     Effort: Pulmonary effort is normal.     Breath sounds: Normal breath sounds. No wheezing, rhonchi or rales.  Abdominal:     General: Bowel sounds are normal. There is no distension.     Palpations: Abdomen is soft. There is no mass.     Tenderness: There is no abdominal tenderness. There is no guarding or rebound.  Musculoskeletal:     Right lower leg: No edema.     Left lower leg: No edema.     Comments: Decreased ROM right shoulder   Skin:  General: Skin is warm and dry.     Capillary Refill: Capillary refill takes less than 2 seconds.  Neurological:     General: No focal deficit present.     Mental Status: She is alert and oriented to person, place, and time.  Psychiatric:        Mood and Affect: Mood normal.        Behavior: Behavior normal.        Thought Content: Thought content normal.        Judgment: Judgment normal.     Labs reviewed: Basic Metabolic Panel: Recent Labs    04/21/19 0804  NA 135  K 4.9  CL 100  CO2 27  GLUCOSE 86  BUN 28*  CREATININE 0.69  CALCIUM 9.3   Liver Function Tests: Recent Labs    04/21/19 0804  AST 35  ALT 30*  BILITOT 0.5  PROT 6.8   No results for input(s): LIPASE, AMYLASE in the last 8760 hours. No results for input(s): AMMONIA in the last 8760 hours. CBC: Recent Labs    04/21/19 0804  WBC 4.5  NEUTROABS 2,777  HGB 15.0  HCT 43.1  MCV 98.0  PLT 206   Lipid Panel: Recent Labs    04/21/19 0804  CHOL 185  HDL 63  LDLCALC 107*  TRIG 61  CHOLHDL 2.9   No results found for: HGBA1C  Procedures since last visit: CT SHOULDER RIGHT WO CONTRAST  Result Date: 04/02/2019 CLINICAL DATA:  Preop for total shoulder arthroplasty. Chronic shoulder pain. EXAM: CT OF THE UPPER RIGHT EXTREMITY WITHOUT CONTRAST TECHNIQUE: Multidetector CT imaging of the upper  right extremity was performed according to the standard protocol. COMPARISON:  None. FINDINGS: Advanced glenohumeral joint degenerative changes with mild joint space narrowing, extensive osteophytic spurring involving the humeral head and mild subchondral cystic change. No significant thinning or widening of the glenoid. No significant subchondral cystic type degenerative changes involving the glenoid. Small ossified loose body is noted in the axillary recess. The [facility] joint is intact. No significant degenerative changes. The acromion is type 1-2 in shape. No lateral downsloping or undersurface spurring. Grossly by CT the rotator cuff tendons are intact. No obvious large full-thickness retracted tear. The visualized right ribs are intact and the right lung is clear. A right breast prosthesis is noted with probable contained rupture. IMPRESSION: 1. Advanced glenohumeral joint degenerative changes. 2. Small ossified loose body in the axillary recess. 3. Grossly by CT the rotator cuff tendons are intact. Electronically Signed   By: Marijo Sanes M.D.   On: 04/02/2019 08:50   MR SHOULDER RIGHT WO CONTRAST  Result Date: 04/02/2019 CLINICAL DATA:  Preop for total shoulder arthroplasty. EXAM: MRI OF THE RIGHT SHOULDER WITHOUT CONTRAST TECHNIQUE: Multiplanar, multisequence MR imaging of the shoulder was performed. No intravenous contrast was administered. COMPARISON:  CT scan, same date. FINDINGS: Rotator cuff: Mild to moderate rotator cuff tendinopathy/tendinosis but no partial or full thickness rotator cuff tear. Muscles:  Unremarkable. Biceps long head:  Intact Acromioclavicular Joint: No significant degenerative changes. Type 1-2 acromion. No significant lateral downsloping or undersurface spurring. Glenohumeral Joint: Advanced degenerative changes with a huge spur projecting off the humeral head inferiorly. There is also large areas of cystic type degenerative changes involving the humeral head. Small joint effusion  and mild synovitis. Rotator interval synovitis is also noted. Labrum:  Degenerated and torn. Bones:  No acute bony findings. Other: Mild subacromial/subdeltoid bursitis. IMPRESSION: 1. Mild to moderate rotator cuff tendinopathy/tendinosis but no partial  or full thickness rotator cuff tear. 2. Intact long head biceps tendon. 3. Advanced glenohumeral joint degenerative changes as described above. 4. Degenerated and torn glenoid labrum. 5. Mild subacromial/subdeltoid bursitis. Electronically Signed   By: Marijo Sanes M.D.   On: 04/02/2019 08:53   DG Foot Complete Left  Result Date: 03/25/2019 Please see detailed radiograph report in office note.  DG Foot Complete Right  Result Date: 03/25/2019 Please see detailed radiograph report in office note.  Assessment/Plan 1. Annual physical exam -performed today including breast exam and pap smear  2. Mitral valve prolapse - no related problems - EKG 12-Lead performed today:    3. Pure hypercholesterolemia - LDL 107 with goal less than 70 with her family history and it's her personal goal - EKG 12-Lead  4. Primary osteoarthritis involving multiple joints -continue tart cherry, but try to reduce dose due to use of glucosamine-chondroitin now  5. Other insomnia -continues valerian root and mag citrate for this but still not sleeping well -follows usual conservative interventions to promote rest and exercises 6 days per week -not a typical sleep apnea patient  6. Hot flashes -weaned off black cohosh since menopause  7. Osteopenia, unspecified location - last bone density was in 2017 at [facility] with T score -1.5 lumbar spine and 1.8 right femoral neck - DG Bone Density; Future  Labs/tests ordered:  Add vitamin D3 Next appt:  1 year for annual exam with fasting labs before  Fredrick Dray L. Kaysia Willard, D.O. [facility] [address], [place] [address] Cell Phone (Mon-Fri 8am-5pm):  [phone] On  Call:  [phone] & follow prompts after 5pm & weekends Office Phone:  [phone] Office Fax:  [phone]

## 2019-04-23 NOTE — Progress Notes (Signed)
NSR at 62 bpm.  No acute ischemia or infarct.

## 2019-04-23 NOTE — Telephone Encounter (Signed)
Message routed to Reed, Tiffany L, DO  

## 2019-04-24 ENCOUNTER — Encounter: Payer: Self-pay | Admitting: Internal Medicine

## 2019-04-24 LAB — LIPID PANEL
Cholesterol: 185 mg/dL (ref <200)
HDL: 63 mg/dL (ref >=50)
LDL Cholesterol (Calc): 107 mg/dL (calc) — ABNORMAL HIGH
Non-HDL Cholesterol (Calc): 122 mg/dL (calc) (ref <130)
Total CHOL/HDL Ratio: 2.9 (calc) (ref <5.0)
Triglycerides: 61 mg/dL (ref <150)

## 2019-04-24 LAB — COMPLETE METABOLIC PANEL WITH GFR
AG Ratio: 1.8 (calc) (ref 1.0–2.5)
ALT: 30 U/L — ABNORMAL HIGH (ref 6–29)
AST: 35 U/L (ref 10–35)
Albumin: 4.4 g/dL (ref 3.6–5.1)
Alkaline phosphatase (APISO): 60 U/L (ref 37–153)
BUN/Creatinine Ratio: 41 (calc) — ABNORMAL HIGH (ref 6–22)
BUN: 28 mg/dL — ABNORMAL HIGH (ref 7–25)
CO2: 27 mmol/L (ref 20–32)
Calcium: 9.3 mg/dL (ref 8.6–10.4)
Chloride: 100 mmol/L (ref 98–110)
Creat: 0.69 mg/dL (ref 0.50–1.05)
GFR, Est African American: 112 mL/min/{1.73_m2} (ref >=60)
GFR, Est Non African American: 97 mL/min/{1.73_m2} (ref >=60)
Globulin: 2.4 g/dL (calc) (ref 1.9–3.7)
Glucose, Bld: 86 mg/dL (ref 65–99)
Potassium: 4.9 mmol/L (ref 3.5–5.3)
Sodium: 135 mmol/L (ref 135–146)
Total Bilirubin: 0.5 mg/dL (ref 0.2–1.2)
Total Protein: 6.8 g/dL (ref 6.1–8.1)

## 2019-04-24 LAB — VITAMIN D 25 HYDROXY (VIT D DEFICIENCY, FRACTURES): Vit D, 25-Hydroxy: 40 ng/mL (ref 30–100)

## 2019-04-24 LAB — CBC WITH DIFFERENTIAL/PLATELET
Absolute Monocytes: 383 cells/uL (ref 200–950)
Basophils Absolute: 18 cells/uL (ref 0–200)
Basophils Relative: 0.4 %
Eosinophils Absolute: 140 cells/uL (ref 15–500)
Eosinophils Relative: 3.1 %
HCT: 43.1 % (ref 35.0–45.0)
Hemoglobin: 15 g/dL (ref 11.7–15.5)
Lymphs Abs: 1184 cells/uL (ref 850–3900)
MCH: 34.1 pg — ABNORMAL HIGH (ref 27.0–33.0)
MCHC: 34.8 g/dL (ref 32.0–36.0)
MCV: 98 fL (ref 80.0–100.0)
MPV: 10.6 fL (ref 7.5–12.5)
Monocytes Relative: 8.5 %
Neutro Abs: 2777 cells/uL (ref 1500–7800)
Neutrophils Relative %: 61.7 %
Platelets: 206 10*3/uL (ref 140–400)
RBC: 4.4 10*6/uL (ref 3.80–5.10)
RDW: 11.7 % (ref 11.0–15.0)
Total Lymphocyte: 26.3 %
WBC: 4.5 10*3/uL (ref 3.8–10.8)

## 2019-04-24 LAB — SEDIMENTATION RATE: Sed Rate: 5 mm/h (ref 0–30)

## 2019-04-24 LAB — PAP IG (IMAGE GUIDED)

## 2019-04-24 LAB — C-REACTIVE PROTEIN: CRP: <0.2 mg/L (ref <8.0)

## 2019-04-24 LAB — TEST AUTHORIZATION

## 2019-04-24 LAB — HIV ANTIBODY (ROUTINE TESTING W REFLEX): HIV 1&2 Ab, 4th Generation: NONREACTIVE

## 2019-04-24 NOTE — Progress Notes (Signed)
Pap smear was normal.

## 2019-05-07 ENCOUNTER — Encounter: Payer: Self-pay | Admitting: Internal Medicine

## 2019-05-09 ENCOUNTER — Encounter: Payer: Self-pay | Admitting: Internal Medicine

## 2019-05-09 DIAGNOSIS — Z789 Other specified health status: Secondary | ICD-10-CM

## 2019-05-09 DIAGNOSIS — Z1159 Encounter for screening for other viral diseases: Secondary | ICD-10-CM

## 2019-05-09 DIAGNOSIS — E7800 Pure hypercholesterolemia, unspecified: Secondary | ICD-10-CM

## 2019-05-12 NOTE — Telephone Encounter (Signed)
Message routed to Reed, Tiffany L, DO  

## 2019-05-27 ENCOUNTER — Other Ambulatory Visit: Payer: No Typology Code available for payment source

## 2019-08-11 ENCOUNTER — Ambulatory Visit
Admission: RE | Admit: 2019-08-11 | Discharge: 2019-08-11 | Disposition: A | Discharge status: Home / Self Care | Payer: No Typology Code available for payment source | Source: Ambulatory Visit | Attending: Internal Medicine | Admitting: Internal Medicine

## 2019-08-11 ENCOUNTER — Other Ambulatory Visit: Payer: Self-pay

## 2019-08-11 DIAGNOSIS — M8580 Other specified disorders of bone density and structure, unspecified site: Secondary | ICD-10-CM

## 2019-08-20 ENCOUNTER — Encounter: Payer: Self-pay | Admitting: Internal Medicine

## 2019-08-20 NOTE — Telephone Encounter (Signed)
Message routed to Reed, Tiffany L, DO  

## 2019-08-21 MED ORDER — ALENDRONATE SODIUM 70 MG PO TABS: 70.0000 mg | ORAL_TABLET | ORAL | 3 refills | Status: DC

## 2019-08-21 MED FILL — ALENDRONATE NA 70 MG TAB: 70 | 84 days supply | Qty: 12 | Fill #0

## 2019-09-28 ENCOUNTER — Encounter: Payer: Self-pay | Admitting: Internal Medicine

## 2019-10-02 MED FILL — TRI-LUMA CREAM: 0.01-4-0.05 | 30 days supply | Qty: 30 | Fill #0

## 2019-10-30 ENCOUNTER — Encounter: Payer: Self-pay | Admitting: Internal Medicine

## 2019-10-30 MED FILL — TRETINOIN 0.05 % CREA: 0.05 | 30 days supply | Qty: 45 | Fill #0

## 2019-11-02 ENCOUNTER — Other Ambulatory Visit: Payer: No Typology Code available for payment source

## 2019-11-02 ENCOUNTER — Other Ambulatory Visit: Payer: Self-pay

## 2019-11-02 DIAGNOSIS — Z789 Other specified health status: Secondary | ICD-10-CM

## 2019-11-02 DIAGNOSIS — E7800 Pure hypercholesterolemia, unspecified: Secondary | ICD-10-CM

## 2019-11-02 DIAGNOSIS — Z1159 Encounter for screening for other viral diseases: Secondary | ICD-10-CM

## 2019-11-03 LAB — HEPATIC FUNCTION PANEL
AG Ratio: 1.9 (calc) (ref 1.0–2.5)
ALT: 23 U/L (ref 6–29)
AST: 36 U/L — ABNORMAL HIGH (ref 10–35)
Albumin: 4.3 g/dL (ref 3.6–5.1)
Alkaline phosphatase (APISO): 57 U/L (ref 37–153)
Bilirubin, Direct: 0.1 mg/dL (ref 0.0–0.2)
Globulin: 2.3 g/dL (calc) (ref 1.9–3.7)
Indirect Bilirubin: 0.4 mg/dL (calc) (ref 0.2–1.2)
Total Bilirubin: 0.5 mg/dL (ref 0.2–1.2)
Total Protein: 6.6 g/dL (ref 6.1–8.1)

## 2019-11-03 LAB — LIPID PANEL
Cholesterol: 173 mg/dL (ref <200)
HDL: 70 mg/dL (ref >=50)
LDL Cholesterol (Calc): 90 mg/dL (calc)
Non-HDL Cholesterol (Calc): 103 mg/dL (calc) (ref <130)
Total CHOL/HDL Ratio: 2.5 (calc) (ref <5.0)
Triglycerides: 51 mg/dL (ref <150)

## 2019-11-03 LAB — BASIC METABOLIC PANEL WITH GFR
BUN: 18 mg/dL (ref 7–25)
CO2: 25 mmol/L (ref 20–32)
Calcium: 9 mg/dL (ref 8.6–10.4)
Chloride: 103 mmol/L (ref 98–110)
Creat: 0.59 mg/dL (ref 0.50–1.05)
GFR, Est African American: 117 mL/min/{1.73_m2} (ref >=60)
GFR, Est Non African American: 101 mL/min/{1.73_m2} (ref >=60)
Glucose, Bld: 90 mg/dL (ref 65–99)
Potassium: 4.7 mmol/L (ref 3.5–5.3)
Sodium: 135 mmol/L (ref 135–146)

## 2019-11-03 LAB — HEPATITIS C ANTIBODY
Hepatitis C Ab: NONREACTIVE
SIGNAL TO CUT-OFF: 0.01 (ref <1.00)

## 2019-11-05 ENCOUNTER — Other Ambulatory Visit: Payer: Self-pay

## 2019-11-05 DIAGNOSIS — R7989 Other specified abnormal findings of blood chemistry: Secondary | ICD-10-CM

## 2019-11-13 ENCOUNTER — Other Ambulatory Visit: Payer: Self-pay

## 2019-11-16 ENCOUNTER — Encounter: Payer: Self-pay | Admitting: Internal Medicine

## 2019-11-20 ENCOUNTER — Other Ambulatory Visit: Payer: Self-pay

## 2019-11-23 ENCOUNTER — Encounter: Payer: Self-pay | Admitting: Internal Medicine

## 2019-11-23 ENCOUNTER — Telehealth: Payer: Self-pay

## 2019-11-23 NOTE — Telephone Encounter (Signed)
Tara [name] stated she received her flu shot today from [employer] [facility] 11/23/2019 Lot # [id number] and she wanted to inform us the she had pfizer on 02/26/2019 and 04/19/2019 [id number] and [id number]. I will document updates in immunizations.

## 2019-12-02 ENCOUNTER — Other Ambulatory Visit: Payer: No Typology Code available for payment source

## 2019-12-02 ENCOUNTER — Other Ambulatory Visit: Payer: Self-pay

## 2019-12-02 DIAGNOSIS — R7989 Other specified abnormal findings of blood chemistry: Secondary | ICD-10-CM

## 2019-12-02 DIAGNOSIS — R945 Abnormal results of liver function studies: Secondary | ICD-10-CM

## 2019-12-03 ENCOUNTER — Ambulatory Visit (INDEPENDENT_AMBULATORY_CARE_PROVIDER_SITE_OTHER): Payer: No Typology Code available for payment source | Admitting: Family Medicine

## 2019-12-03 ENCOUNTER — Encounter: Payer: Self-pay | Admitting: Internal Medicine

## 2019-12-03 DIAGNOSIS — Z713 Dietary counseling and surveillance: Secondary | ICD-10-CM | POA: Diagnosis not present

## 2019-12-03 LAB — HEPATIC FUNCTION PANEL
AG Ratio: 1.9 (calc) (ref 1.0–2.5)
ALT: 26 U/L (ref 6–29)
AST: 37 U/L — ABNORMAL HIGH (ref 10–35)
Albumin: 4.3 g/dL (ref 3.6–5.1)
Alkaline phosphatase (APISO): 59 U/L (ref 37–153)
Bilirubin, Direct: 0.1 mg/dL (ref 0.0–0.2)
Globulin: 2.3 g/dL (calc) (ref 1.9–3.7)
Indirect Bilirubin: 0.5 mg/dL (calc) (ref 0.2–1.2)
Total Bilirubin: 0.6 mg/dL (ref 0.2–1.2)
Total Protein: 6.6 g/dL (ref 6.1–8.1)

## 2019-12-03 NOTE — Patient Instructions (Addendum)
-   Experiment with about 1 tsp of minced ginger (or 1/2 tsp powdered).  Let's see if that helps your iron absorption.    - Your current protein intake is high most days.  You absolutely cannot use more than 100 g of protein/day to meet protein needs!  Try using only 1/2 scoop of protein powder mixed in almond milk instead of water.    - A macronutrient profile that will be good for 2000-cal diet is as follows:   -   55 g fat = 25%  (~15g from saturated, 30g from monounsat'd, 15g from polyunsat'd)   - 300 g carb = 60%   -   75 g pro = 15%   - Note: Pay attention to high-quality fats and carb's.    - Calcium: Aim for 1200 mg/day from food, and supplement as needed.   - Iron: Continue getting dark green leafy greens of any kind daily (incorporate variety as possible), and beans at least 3 X wk.    Follow-up:  Please keep me posted, and let me know if you have questions.

## 2019-12-03 NOTE — Progress Notes (Signed)
Medical Nutrition Therapy PCP Hollace Kinnier, DO Assessment:  Primary concerns today: Weight management and lipids management without medication.  Tara [name] has continued to make cautious food choices, getting a lot of fruits and vegetables, limiting saturated fat, and kcal.  Her objectives potentially related to nutrition include: - pain relief (esp shoulders and other joints) - improve bone health (currently in osteoporotic range at all sites scanned) - better quality sleep - eliminate hot flashes - manage weight - no recurrence of elevated BP - minimize both cholesterol med's and supplements  LDL is down (90 on 9/13) since Tara [name] started taking red yeast rice again.  Main nutrition-related concern is bone health.  Tara [name] started going to [place] in August.   Weight today:  Self-report: 135 lb.  Says she is "miserable" from joint pain if gets back to 140 lb.  Has been advised she needs a R shoulder replacement.   Usual eating pattern: 3 meals and 3 snacks per day.   Recent usual physical activity: 6 workouts/wk at 45 min (e.g., 20 min Pilates, 25 min free wts).  Runs 3 mi Tue and [place], and ~6 mi on Saturday.  Also at least 30 min stretches 4 X wk.  Sleep: Still estimates she is getting 3 to 5 1/2 hours/night.  Falls asleep for 2-3 hrs, then can't get back to sleep.    24-hr recall:  (Up at 5:30 AM; walked mid-day, then weights at 5:30 PM, and stretching at 6:30) B (6 AM)-  1 c plain Grk yogurt, 2 tbsp oatmeal, 1 tbsp cocoa, scmbld eggs (2 eggs, 1 white), water, blk decaf, 4 oz almond milk Snk (10 AM)-  2 svng pdrd pb in water, 1 apple, 10 hazelnuts, water L (1 PM)-  1 hi-fiber tortilla, 1 c roasted spinach, tomato, 1/4 c f-f chs, 1/4 c 2% chs, 1/2 c garbanzos, 3 T hummus, 1 carrot, 1 svng pdrd pb in water, water Snk (4 PM)-  1 (hydrolyzed) pro shake (20 g pro) w/ 1/2 banana D (8 PM)-  3 c spinach, broc, cabbage, 2 T alm butter, 1 slc rye bread, 2 svng pdrd pb in water, 1 carrot, 1 c  plain Grk yogurt, 1 T coc flakes Snk (10:30)-  1 svng pdrd pb in water  Typical day? Yes.     Nutritional Diagnosis:  NB-1.1 Food and nutrition-related knowledge deficit As related to best dietary regimen to optimize both health and satisfaction.  As evidenced by patient's frustration at inability to manage lipids and weight.    Handouts given during visit include:  After-Visit Summary (AVS)   Demonstrated degree of understanding via:  Teach Back  Barriers to learning/adherence to lifestyle change: Compulsivity in eating and exercise.    Monitoring/Evaluation:  Dietary intake, exercise, and body weight prn.

## 2019-12-03 NOTE — Progress Notes (Signed)
Very minimal change in labs since last time.  To me, this does not warrant a full investigation for autoimmune hepatitis.  I would be more suspicious that some of the nontraditional therapies used could be contributing.  SENT IN [facility] CALL ALSO

## 2019-12-04 ENCOUNTER — Other Ambulatory Visit: Payer: Self-pay

## 2019-12-07 ENCOUNTER — Telehealth: Payer: No Typology Code available for payment source | Admitting: Family Medicine

## 2019-12-24 ENCOUNTER — Encounter: Payer: 59 | Admitting: Nurse Practitioner

## 2019-12-31 ENCOUNTER — Ambulatory Visit (INDEPENDENT_AMBULATORY_CARE_PROVIDER_SITE_OTHER): Payer: No Typology Code available for payment source | Admitting: Internal Medicine

## 2019-12-31 ENCOUNTER — Other Ambulatory Visit: Payer: Self-pay

## 2019-12-31 ENCOUNTER — Encounter: Payer: Self-pay | Admitting: Internal Medicine

## 2019-12-31 VITALS — BP 100/62 | HR 62 | Temp 97.8°F | Ht 70.0 in | Wt 137.6 lb

## 2019-12-31 DIAGNOSIS — E7800 Pure hypercholesterolemia, unspecified: Secondary | ICD-10-CM | POA: Diagnosis not present

## 2019-12-31 DIAGNOSIS — M19011 Primary osteoarthritis, right shoulder: Secondary | ICD-10-CM | POA: Diagnosis not present

## 2019-12-31 DIAGNOSIS — R945 Abnormal results of liver function studies: Secondary | ICD-10-CM | POA: Diagnosis not present

## 2019-12-31 DIAGNOSIS — R7989 Other specified abnormal findings of blood chemistry: Secondary | ICD-10-CM

## 2019-12-31 DIAGNOSIS — R29898 Other symptoms and signs involving the musculoskeletal system: Secondary | ICD-10-CM | POA: Diagnosis not present

## 2019-12-31 NOTE — Patient Instructions (Signed)
See Dr. Tamala Julian possibly for some manipulation to help your alignment prior to your rotator cuff surgery.

## 2019-12-31 NOTE — Progress Notes (Signed)
Location:  [facility] clinic Provider: Briena Swingler L. Mariea Clonts, D.O., C.M.D.  Goals of Care:  Advanced Directives 12/31/2019  Does Patient Have a Medical Advance Directive? Yes  Type of Paramedic of [place];Living will  Does patient want to make changes to medical advance directive? No - Patient declined  Copy of [place] in Chart? Yes - validated most recent copy scanned in chart (See row information)  Pre-existing out of facility DNR order (yellow form or pink MOST form) -     Chief Complaint  Patient presents with  . Acute Visit    Left hip and and leg x 6 months. Hurts far more with sitting than exercise. Joint pain seem to be getting worse.     HPI: Patient is a 59 y.o. female seen today for an acute visit for left hip and leg pain x 6 months.  Hurts more with sitting at 90 degrees than exercise.  It's connected and rotates.  1988, she had a meniscal tear on the right.  She realizes she does not stand straight up when she's seen  Each day seems different for her pain.  She did have her right knee xrayed due to inflammation--just showed arthritis.  Really doesn't want to do anything (as in surgery).    Left shoulder got to where she could not move it b/c of ligament or tendon.  Needs to have right shoulder replacement.    Had her full rheum workup w/o RA.    She's tracking her macros and micros.  Her protein was even higher than she thought it was.    Concerned about her liver.  Wants to keep her RBCs up.    Thinks if she could sleep, it would make it all better. Thinks she cannot remember well b/c of her sleep. Did therapy for 6 months straight and can tell zero difference.    She is keeping having to wipe her mouth on the right.  Looks symmetric.  Saliva is pooling on right side.    Past Medical History:  Diagnosis Date  . Arthritis   . Hyperlipidemia   . Insomnia   . Mitral valve prolapse     Past Surgical History:  Procedure  Laterality Date  . AUGMENTATION MAMMAPLASTY Bilateral    Dec 2019  . BREAST BIOPSY Right    axillary node benign  . [place]   right  . SHOULDER ARTHROSCOPY WITH LABRAL REPAIR Right 08/28/2012   Procedure: RIGHT SHOULDER ARTHROSCOPY WITH LABRAL DEBRIDEMENT, ASPIRATION AND DECOMPRESSION OF PARALABRAL CYST;  Surgeon: Marin Shutter, MD;  Location: [place];  Service: Orthopedics;  Laterality: Right;    Allergies  Allergen Reactions  . Codeine Nausea And Vomiting  . Hydrocodone Nausea And Vomiting  . Triple Antibiotic Pain Relief [Neomy-Bacit-Polymyx-Pramoxine] Other (See Comments)    Burn  . Triple Antibiotic W/Hydrocortisone  [Bacitra-Neomycin-Polymyxin-Hc]     Outpatient Encounter Medications as of 12/31/2019  Medication Sig  . Calcium Citrate 250 MG TABS Take 1,200 mg by mouth daily. Number varies each day based on calcium ingested in food  . cetirizine (ZYRTEC) 10 MG tablet Take 10 mg by mouth daily as needed.   . Cholecalciferol (VITAMIN D3 PO) Take 2,000 Units by mouth daily at 6 (six) AM.  . Fluocin-Hydroquinone-Tretinoin (TRI-LUMA EX) Apply 1 application topically daily.  . fluticasone (FLONASE) 50 MCG/ACT nasal spray Place 1 spray into both nostrils 3 (three) times daily as needed.   . Multiple Vitamin (MULTIVITAMIN  WITH MINERALS) TABS Take 1 tablet by mouth daily.  . Omega-3 Fatty Acids (FISH OIL) 1000 MG CAPS Take 1 capsule by mouth 2 (two) times daily.   Marland Kitchen OVER THE COUNTER MEDICATION Formula 202 take 2 tablets at bedtime/ supplement for pain and rest  . OVER THE COUNTER MEDICATION 600 mg in the morning and at bedtime. Red Yeast Rice  . tretinoin (RETIN-A) 0.05 % cream Apply 1 application topically at bedtime.  . [DISCONTINUED] Cholecalciferol (VITAMIN D3) 125 MCG (5000 UT) CAPS Take 15,000 Units by mouth once a week.   . [DISCONTINUED] GLUCOSAMINE-CHONDROITIN PO 4500 mg Glucosamine, 3600 mg Chondroitin = 3 capsules of each per day with food  . [DISCONTINUED]  Magnesium 100 MG CAPS Take 300 mg by mouth at bedtime.   . [DISCONTINUED] Misc Natural Products (BETA-SITOSTEROL PLANT STEROLS PO) Take 375 mg by mouth in the morning, at noon, and at bedtime.   . [DISCONTINUED] UNABLE TO FIND Med Name: Essential Oils.  . [DISCONTINUED] VALERIAN ROOT PO Take 1,600 mg by mouth at bedtime.    No facility-administered encounter medications on file as of 12/31/2019.    Review of Systems:  Review of Systems  Constitutional: Negative for chills, fever and malaise/fatigue.  HENT: Negative for sore throat.   Eyes: Negative for blurred vision.  Respiratory: Negative for cough and shortness of breath.   Cardiovascular: Negative for chest pain, palpitations and leg swelling.  Gastrointestinal: Negative for abdominal pain.  Genitourinary: Negative for dysuria.  Musculoskeletal: Positive for joint pain and myalgias. Negative for falls.  Neurological: Negative for dizziness and loss of consciousness.  Psychiatric/Behavioral: Negative for memory loss.       She reports memory loss herself    Health Maintenance  Topic Date Due  . PAP SMEAR-Modifier  02/08/2019  . MAMMOGRAM  03/10/2021  . DEXA SCAN  08/10/2021  . COLONOSCOPY  02/19/2022  . TETANUS/TDAP  02/20/2025  . INFLUENZA VACCINE  Completed  . COVID-19 Vaccine  Completed  . Hepatitis C Screening  Completed  . HIV Screening  Completed    Physical Exam: Vitals:   12/31/19 1534  BP: 100/62  Pulse: 62  Temp: 97.8 F (36.6 C)  TempSrc: Temporal  SpO2: 98%  Weight: 137 lb 9.6 oz (62.4 kg)  Height: 5\' 10"  (1.778 m)   Body mass index is 19.74 kg/m. Physical Exam Vitals reviewed.  Constitutional:      General: She is not in acute distress.    Appearance: Normal appearance.  HENT:     Head: Normocephalic and atraumatic.  Cardiovascular:     Rate and Rhythm: Normal rate and regular rhythm.  Pulmonary:     Effort: Pulmonary effort is normal.     Breath sounds: Normal breath sounds. No wheezing,  rhonchi or rales.  Musculoskeletal:        General: No tenderness. Normal range of motion.     Right lower leg: No edema.     Left lower leg: No edema.     Comments: Right PSIS superior to left during lumbar flexion  Skin:    General: Skin is warm and dry.  Neurological:     General: No focal deficit present.     Mental Status: She is alert and oriented to person, place, and time.     Motor: No weakness.     Gait: Gait normal.  Psychiatric:        Mood and Affect: Mood normal.     Labs reviewed: Basic Metabolic Panel: Recent Labs  04/21/19 0804 11/02/19 0805  NA 135 135  K 4.9 4.7  CL 100 103  CO2 27 25  GLUCOSE 86 90  BUN 28* 18  CREATININE 0.69 0.59  CALCIUM 9.3 9.0   Liver Function Tests: Recent Labs    04/21/19 0804 11/02/19 0805 12/02/19 0808  AST 35 36* 37*  ALT 30* 23 26  BILITOT 0.5 0.5 0.6  PROT 6.8 6.6 6.6   No results for input(s): LIPASE, AMYLASE in the last 8760 hours. No results for input(s): AMMONIA in the last 8760 hours. CBC: Recent Labs    04/21/19 0804  WBC 4.5  NEUTROABS 2,777  HGB 15.0  HCT 43.1  MCV 98.0  PLT 206   Lipid Panel: Recent Labs    04/21/19 0804 11/02/19 0805  CHOL 185 173  HDL 63 70  LDLCALC 107* 90  TRIG 61 51  CHOLHDL 2.9 2.5    Assessment/Plan 1. Abnormal LFTs -very very mild -has some family history of autoimmune disease so we're watching this closely -she has no symptoms related  2. Pure hypercholesterolemia -continues red yeast rice and very good diet and exercise program that I've been unable to come up with any suggestions to improve at this point  3. Misalignment of hip -suggested she see Dr. Hulan Saas for OMM potentially to help her hip pain she's having on left -has considerable shoulder degenerative disease in need of replacements so may have altered her alignment  4. Arthritis of right shoulder region -has plans for right shoulder replacement but is not eager due to her young age  and worries about her other parts when she's recovering  Labs/tests ordered:  No orders of the defined types were placed in this encounter.  Next appt:  04/25/2020  Diago Haik L. Elyssia Strausser, D.O. [facility] [address], [place] [address] Cell Phone (Mon-Fri 8am-5pm):  [phone] On Call:  [phone] & follow prompts after 5pm & weekends Office Phone:  [phone] Office Fax:  [phone]

## 2020-01-06 ENCOUNTER — Other Ambulatory Visit: Payer: Self-pay | Admitting: Orthopedic Surgery

## 2020-01-06 DIAGNOSIS — M13811 Other specified arthritis, right shoulder: Secondary | ICD-10-CM

## 2020-02-02 ENCOUNTER — Encounter: Payer: Self-pay | Admitting: Internal Medicine

## 2020-02-02 DIAGNOSIS — M159 Polyosteoarthritis, unspecified: Secondary | ICD-10-CM

## 2020-02-02 DIAGNOSIS — M8949 Other hypertrophic osteoarthropathy, multiple sites: Secondary | ICD-10-CM

## 2020-02-02 DIAGNOSIS — R29898 Other symptoms and signs involving the musculoskeletal system: Secondary | ICD-10-CM

## 2020-02-02 DIAGNOSIS — M150 Primary generalized (osteo)arthritis: Secondary | ICD-10-CM

## 2020-02-02 DIAGNOSIS — M19011 Primary osteoarthritis, right shoulder: Secondary | ICD-10-CM

## 2020-02-05 ENCOUNTER — Other Ambulatory Visit: Payer: No Typology Code available for payment source

## 2020-02-05 DIAGNOSIS — Z20822 Contact with and (suspected) exposure to covid-19: Secondary | ICD-10-CM

## 2020-02-07 LAB — NOVEL CORONAVIRUS, NAA: SARS-CoV-2, NAA: NOT DETECTED

## 2020-02-07 LAB — SARS-COV-2, NAA 2 DAY TAT

## 2020-02-10 ENCOUNTER — Other Ambulatory Visit (HOSPITAL_COMMUNITY): Payer: Self-pay | Admitting: Orthopedic Surgery

## 2020-02-10 MED FILL — CYCLOBENZAPRINE HCL 10 MG T: 10 | 10 days supply | Qty: 30 | Fill #0

## 2020-02-10 MED FILL — ONDANSETRON HCL 4 MG TABLET: 4 | 5 days supply | Qty: 20 | Fill #0

## 2020-02-10 MED FILL — CELECOXIB 200 MG CAP: 200 | 30 days supply | Qty: 30 | Fill #0

## 2020-02-10 MED FILL — traMADol HCL 50 MG TABS: 50 | 5 days supply | Qty: 30 | Fill #0

## 2020-02-19 MED FILL — traMADol HCL 50 MG TABS: 50 | 5 days supply | Qty: 30 | Fill #0

## 2020-02-19 MED FILL — CELECOXIB 200 MG CAP: 200 | 30 days supply | Qty: 30 | Fill #0

## 2020-02-19 MED FILL — ONDANSETRON HCL 4 MG TABLET: 4 | 5 days supply | Qty: 20 | Fill #0

## 2020-02-19 MED FILL — CYCLOBENZAPRINE HCL 10 MG T: 10 | 10 days supply | Qty: 30 | Fill #0

## 2020-02-23 ENCOUNTER — Ambulatory Visit
Admission: RE | Admit: 2020-02-23 | Discharge: 2020-02-23 | Disposition: A | Discharge status: Home / Self Care | Payer: 59 | Source: Ambulatory Visit | Attending: Orthopedic Surgery | Admitting: Orthopedic Surgery

## 2020-02-23 DIAGNOSIS — M13811 Other specified arthritis, right shoulder: Secondary | ICD-10-CM

## 2020-02-23 DIAGNOSIS — M19011 Primary osteoarthritis, right shoulder: Secondary | ICD-10-CM | POA: Diagnosis not present

## 2020-02-23 DIAGNOSIS — Z01818 Encounter for other preprocedural examination: Secondary | ICD-10-CM | POA: Diagnosis not present

## 2020-02-23 DIAGNOSIS — Z96611 Presence of right artificial shoulder joint: Secondary | ICD-10-CM | POA: Diagnosis not present

## 2020-02-24 NOTE — Patient Instructions (Signed)
DUE TO COVID-19 ONLY ONE VISITOR IS ALLOWED TO COME WITH YOU AND STAY IN THE WAITING ROOM ONLY DURING PRE OP AND PROCEDURE DAY OF SURGERY. THE 1 VISITOR  MAY VISIT WITH YOU AFTER SURGERY IN YOUR PRIVATE ROOM DURING VISITING HOURS ONLY!  YOU NEED TO HAVE A COVID 19 TEST ON1/17_______ @_______ , THIS TEST MUST BE DONE BEFORE SURGERY,  COVID TESTING SITE [address] [place] , IT IS ON THE RIGHT GOING OUT [address] APPROXIMATELY  2 MINUTES PAST [employer] ON THE RIGHT. ONCE YOUR COVID TEST IS COMPLETED,  PLEASE BEGIN THE QUARANTINE INSTRUCTIONS AS OUTLINED IN YOUR HANDOUT.                Tara [name]    Your procedure is scheduled on: 03/11/19   Report to [facility] Main  Entrance   Report to admitting at  7:30 AM     Call this number if you have problems the morning of surgery [phone]   . BRUSH YOUR TEETH MORNING OF SURGERY AND RINSE YOUR MOUTH OUT, NO CHEWING GUM CANDY OR MINTS.  No food after midnight.    You may have clear liquid until 7:00 AM.    At 6:30 AM drink pre surgery drink  . Nothing by mouth after 7:00  AM.    Take these medicines the morning of surgery with A SIP OF WATER: None                                 You may not have any metal on your body including hair pins and              piercings  Do not wear jewelry, make-up, lotions, powders or perfumes, deodorant             Do not wear nail polish on your fingernails.  Do not shave  48 hours prior to surgery.             .   Do not bring valuables to the hospital. [facility] IS NOT             RESPONSIBLE   FOR VALUABLES.  Contacts, dentures or bridgework may not be worn into surgery.      Patients discharged the day of surgery will not be allowed to drive home.   IF YOU ARE HAVING SURGERY AND GOING HOME THE SAME DAY, YOU MUST HAVE AN ADULT TO DRIVE YOU HOME AND BE WITH YOU FOR 24 HOURS.  YOU MAY GO HOME BY TAXI OR UBER OR ORTHERWISE, BUT AN ADULT MUST  ACCOMPANY YOU HOME AND STAY WITH YOU FOR 24 HOURS.  Name and phone number of your driver:  Special Instructions: N/A              Please read over the following fact sheets you were given: _____________________________________________________________________             [facility]- Preparing for Total Shoulder Arthroplasty    Before surgery, you can play an important role. Because skin is not sterile, your skin needs to be as free of germs as possible. You can reduce the number of germs on your skin by using the following products. . Benzoyl Peroxide Gel o Reduces the number of germs present on the skin o Applied twice a day to shoulder area starting two days before surgery    ==================================================================  Please follow these instructions carefully:  BENZOYL PEROXIDE 5% GEL  Please do not use if you have an allergy to benzoyl peroxide.   If your skin becomes reddened/irritated stop using the benzoyl peroxide.  Starting two days before surgery, apply as follows: 1. Apply benzoyl peroxide in the morning and at night. Apply after taking a shower. If you are not taking a shower clean entire shoulder front, back, and side along with the armpit with a clean wet washcloth.  2. Place a quarter-sized dollop on your shoulder and rub in thoroughly, making sure to cover the front, back, and side of your shoulder, along with the armpit.   2 days before ____ AM   ____ PM              1 day before ____ AM   ____ PM                         3. Do this twice a day for two days.  (Last application is the night before surgery, AFTER using the CHG soap as described below).  4. Do NOT apply benzoyl peroxide gel on the day of surgery 5. . [facility] - Preparing for Surgery Before surgery, you can play an important role.   Because skin is not sterile, your skin needs to be as free of germs as possible .  You can reduce the number of germs on your skin by washing  with CHG (chlorahexidine gluconate) soap before surgery .  CHG is an antiseptic cleaner which kills germs and bonds with the skin to continue killing germs even after washing. Please DO NOT use if you have an allergy to CHG or antibacterial soaps.   If your skin becomes reddened/irritated stop using the CHG and inform your nurse when you arrive at Short Stay. Do not shave (including legs and underarms) for at least 48 hours prior to the first CHG shower.    Please follow these instructions carefully:  1.  Shower with CHG Soap the night before surgery and the  morning of Surgery.  2.  If you choose to wash your hair, wash your hair first as usual with your  normal  shampoo.  3.  After you shampoo, rinse your hair and body thoroughly to remove the  shampoo.                                        4.  Use CHG as you would any other liquid soap.  You can apply chg directly  to the skin and wash                       Gently with a scrungie or clean washcloth.  5.  Apply the CHG Soap to your body ONLY FROM THE NECK DOWN.   Do not use on face/ open                           Wound or open sores. Avoid contact with eyes, ears mouth and genitals (private parts).                       Wash face,  Genitals (private parts) with your normal soap.             6.  Wash thoroughly, paying special attention to the area where your surgery  will be performed.  7.  Thoroughly rinse your body with warm water from the neck down.  8.  DO NOT shower/wash with your normal soap after using and rinsing off  the CHG Soap.             9.  Pat yourself dry with a clean towel.            10.  Wear clean pajamas.            11.  Place clean sheets on your bed the night of your first shower and do not  sleep with pets. Day of Surgery : Do not apply any lotions/deodorants the morning of surgery.  Please wear clean clothes to the hospital/surgery center.  FAILURE TO FOLLOW THESE INSTRUCTIONS MAY RESULT IN THE CANCELLATION OF  YOUR SURGERY PATIENT SIGNATURE_________________________________  NURSE SIGNATURE__________________________________  ________________________________________________________________________

## 2020-02-25 NOTE — Progress Notes (Unsigned)
[facility] [place] Phone: [phone] Subjective:   Tara [name], am serving as a scribe for Dr. Hulan Saas. This visit occurred during the SARS-CoV-2 public health emergency.  Safety protocols were in place, including screening questions prior to the visit, additional usage of staff PPE, and extensive cleaning of exam room while observing appropriate contact time as indicated for disinfecting solutions.   I'm seeing this patient by the request  of:  Mariea Clonts, Tiffany L, DO  CC: right shoulder and hip pain and back pain   [id number]  Tara [name] is a 60 y.o. female coming in with complaint of right shoulder and hip pain. Patient has shoulder arthroscopy scheduled for February 3rd, 2022 by Dr. Onnie Graham. Patient states that she also has left shoulder pain with severe arthritis. States that shoulder is unstable and is looking to maximize ability to use arm before right shoulder surgery. Pain is also in bicep of left arm.   Pain also in lateral, left hip that radiates down to left ankle. Has been stretching hip and leg for over one year and feels [name] difference. Pain is sometimes in the hip and sometimes in the calf. Riding in vehicle makes her pain most intense. Pain feels nerve related. Has been using ice, heat, essential oils, massage therapy, stretching 5 nights a week, fish oil, vit D and calcium. Was using turmeric. Now using tart cherry extract.       Past Medical History:  Diagnosis Date  . Arthritis   . Hyperlipidemia   . Insomnia   . Mitral valve prolapse    Past Surgical History:  Procedure Laterality Date  . AUGMENTATION MAMMAPLASTY Bilateral    Dec 2019  . BREAST BIOPSY Right    axillary node benign  . [place]   right  . SHOULDER ARTHROSCOPY WITH LABRAL REPAIR Right 08/28/2012   Procedure: RIGHT SHOULDER ARTHROSCOPY WITH LABRAL DEBRIDEMENT, ASPIRATION AND DECOMPRESSION OF PARALABRAL CYST;   Surgeon: Marin Shutter, MD;  Location: [facility];  Service: Orthopedics;  Laterality: Right;   Social History   Socioeconomic History  . Marital status: Married    Spouse name: Not on file  . Number of children: Not on file  . Years of education: Not on file  . Highest education level: Not on file  Occupational History  . Not on file  Tobacco Use  . Smoking status: Never Smoker  . Smokeless tobacco: Never Used  Substance and Sexual Activity  . Alcohol use: [name]  . Drug use: [name]  . Sexual activity: Not on file  Other Topics Concern  . Not on file  Social History Narrative   Tobacco use, amount per day now: 0   Past tobacco use, amount per day: 0   How many years did you use tobacco: 0   Alcohol use (drinks per week): 0   Diet: Heart Healthy/DASH Diet/ Lean and Clean type foods.   Do you drink/eat things with caffeine: 1 glass of Tea in the morning.   Marital status: Married                                 What year were you married? 1987   Do you live in a house, apartment, assisted living, condo, trailer, etc.? House   Is it one or more stories? Yes   How many persons live in your  home? 2 ( Husband/ Self )   Do you have pets in your home?( please list) Not currently, Dog Deceased.    Current or past profession: PhD/RN   Do you exercise?    Yes                              Type and how often? 6 Days a week.   Do you have a living will? Yes   Do you have a DNR form?   [name]                               If not, do you want to discuss one? Yes   Do you have signed POA/HPOA forms? Yes                       If so, please bring to you appointment   Social Determinants of Health   Financial Resource Strain: Not on file  Food Insecurity: Not on file  Transportation Needs: Not on file  Physical Activity: Not on file  Stress: Not on file  Social Connections: Not on file   Allergies  Allergen Reactions  . Codeine Nausea And Vomiting  . Hydrocodone Nausea And Vomiting  . Triple  Antibiotic Pain Relief [Neomy-Bacit-Polymyx-Pramoxine] Other (See Comments)    Burn  . Triple Antibiotic W/Hydrocortisone  [Bacitra-Neomycin-Polymyxin-Hc]    Family History  Problem Relation Age of Onset  . Stroke Mother   . Sudden death Neg Hx   . Hypertension Neg Hx   . Hyperlipidemia Neg Hx   . Heart attack Neg Hx   . Diabetes Neg Hx       Current Outpatient Medications (Respiratory):  .  cetirizine (ZYRTEC) 10 MG tablet, Take 10 mg by mouth daily as needed for allergies or rhinitis. .  fluticasone (FLONASE) 50 MCG/ACT nasal spray, Place 1 spray into both nostrils 3 (three) times daily as needed for rhinitis or allergies.  Current Outpatient Medications (Analgesics):  .  celecoxib (CELEBREX) 200 MG capsule, Take 200 mg by mouth daily. .  traMADol (ULTRAM) 50 MG tablet, Take 50 mg by mouth every 4 (four) hours as needed.   Current Outpatient Medications (Other):  Marland Kitchen  CALCIUM CITRATE PO, Take 400 mg by mouth 3 (three) times daily. Number varies each day based on calcium ingested in food .  cyclobenzaprine (FLEXERIL) 10 MG tablet, Take 5-10 mg by mouth every 8 (eight) hours as needed. .  Fluocin-Hydroquinone-Tretinoin (TRI-LUMA EX), Apply 1 application topically at bedtime. .  Multiple Vitamin (MULTIVITAMIN WITH MINERALS) TABS, Take 1 tablet by mouth daily. .  Omega-3 Fatty Acids (FISH OIL) 1000 MG CAPS, Take 1,000 mg by mouth 2 (two) times daily. Vit d3 2000 IU .  ondansetron (ZOFRAN) 4 MG tablet, Take 4 mg by mouth every 6 (six) hours as needed. Marland Kitchen  OVER THE COUNTER MEDICATION, Take 2 tablets by mouth at bedtime. Formula 303  supplement for pain and rest .  OVER THE COUNTER MEDICATION, Take 600 mg by mouth 2 (two) times daily. Marland Kitchen  tretinoin (RETIN-A) 0.05 % cream, Apply 1 application topically at bedtime. (Patient taking differently: Apply 1 application topically daily.)   Reviewed prior external information including notes and imaging from  primary care provider As well as  notes that were available from care everywhere and other healthcare systems.  Past medical history, social,  surgical and family history all reviewed in electronic medical record.  [name] pertanent information unless stated regarding to the chief complaint.   Review of Systems:  [name] headache, visual changes, nausea, vomiting, diarrhea, constipation, dizziness, abdominal pain, skin rash, fevers, chills, night sweats, weight loss, swollen lymph nodes, body aches, joint swelling, chest pain, shortness of breath, mood changes. POSITIVE muscle aches  Objective  Blood pressure 104/78, pulse 65, height 5\' 10"  (1.778 m), weight 140 lb (63.5 kg), last menstrual period 08/14/2012, SpO2 98 %.   General: [name] apparent distress alert and oriented x3 mood and affect normal, dressed appropriately.  HEENT: Pupils equal, extraocular movements intact  Respiratory: Patient's speak in full sentences and does not appear short of breath  Cardiovascular: [name] lower extremity edema, non tender, [name] erythema  Neuro: Cranial nerves II through XII are intact, neurovascularly intact in all extremities with 2+  Patient's upper body has decrease in muscle.  Patient does have significant atrophy of the musculature of the shoulders bilaterally. Focused on left shoulder that showed patient did have crepitus with range of motion.  Patient does have weakness noted.  Did not compared to contralateral side with as knowing she is having replacement.  Neurovascularly intact distally.  Neck some mild loss of lordosis and some mild tenderness to sidebending.  Low back significant tenderness and tightness noted to the paraspinal musculature of the left side.  Mild pain over the left sacroiliac joint.  Mild tightness of Faber on the left compared to the right.  Negative straight leg test.  Limited musculoskeletal ultrasound was performed and interpreted by 08/16/2012  Limited ultrasound of patient's left shoulder shows the patient does have  significant arthritic changes noted.  Patient has severe arthritic changes noted of the glenohumeral joint and the acromioclavicular joint.  Do not see the supraspinatus at all and more of significant degenerative and chronic changes in this area. Impression: Rotator cuff arthropathy  Osteopathic findings L5 flexed rotated and side bent right Sacrum left on left    Impression and Recommendations:     The above documentation has been reviewed and is accurate and complete Judi Saa, DO

## 2020-02-26 ENCOUNTER — Ambulatory Visit (INDEPENDENT_AMBULATORY_CARE_PROVIDER_SITE_OTHER): Payer: 59 | Admitting: Family Medicine

## 2020-02-26 ENCOUNTER — Ambulatory Visit: Payer: Self-pay

## 2020-02-26 ENCOUNTER — Encounter: Payer: Self-pay | Admitting: Family Medicine

## 2020-02-26 ENCOUNTER — Ambulatory Visit (INDEPENDENT_AMBULATORY_CARE_PROVIDER_SITE_OTHER): Payer: 59

## 2020-02-26 ENCOUNTER — Encounter (HOSPITAL_COMMUNITY)
Admission: RE | Admit: 2020-02-26 | Discharge: 2020-02-26 | Disposition: A | Discharge status: Home / Self Care | Payer: 59 | Source: Ambulatory Visit | Attending: Internal Medicine | Admitting: Internal Medicine

## 2020-02-26 ENCOUNTER — Other Ambulatory Visit: Payer: Self-pay

## 2020-02-26 VITALS — BP 104/78 | HR 65 | Ht 70.0 in | Wt 140.0 lb

## 2020-02-26 DIAGNOSIS — M8949 Other hypertrophic osteoarthropathy, multiple sites: Secondary | ICD-10-CM

## 2020-02-26 DIAGNOSIS — M2550 Pain in unspecified joint: Secondary | ICD-10-CM | POA: Diagnosis not present

## 2020-02-26 DIAGNOSIS — M545 Low back pain, unspecified: Secondary | ICD-10-CM | POA: Insufficient documentation

## 2020-02-26 DIAGNOSIS — M25511 Pain in right shoulder: Secondary | ICD-10-CM | POA: Diagnosis not present

## 2020-02-26 DIAGNOSIS — M5442 Lumbago with sciatica, left side: Secondary | ICD-10-CM | POA: Diagnosis not present

## 2020-02-26 DIAGNOSIS — G8929 Other chronic pain: Secondary | ICD-10-CM

## 2020-02-26 DIAGNOSIS — M999 Biomechanical lesion, unspecified: Secondary | ICD-10-CM | POA: Diagnosis not present

## 2020-02-26 DIAGNOSIS — M19212 Secondary osteoarthritis, left shoulder: Secondary | ICD-10-CM

## 2020-02-26 DIAGNOSIS — M25552 Pain in left hip: Secondary | ICD-10-CM | POA: Diagnosis not present

## 2020-02-26 DIAGNOSIS — M4727 Other spondylosis with radiculopathy, lumbosacral region: Secondary | ICD-10-CM | POA: Diagnosis not present

## 2020-02-26 DIAGNOSIS — M159 Polyosteoarthritis, unspecified: Secondary | ICD-10-CM

## 2020-02-26 LAB — CBC WITH DIFFERENTIAL/PLATELET
Basophils Absolute: 0 10*3/uL (ref 0.0–0.1)
Basophils Relative: 0.8 % (ref 0.0–3.0)
Eosinophils Absolute: 0.2 10*3/uL (ref 0.0–0.7)
Eosinophils Relative: 3 % (ref 0.0–5.0)
HCT: 42.3 % (ref 36.0–46.0)
Hemoglobin: 14.5 g/dL (ref 12.0–15.0)
Lymphocytes Relative: 24.2 % (ref 12.0–46.0)
Lymphs Abs: 1.3 10*3/uL (ref 0.7–4.0)
MCHC: 34.2 g/dL (ref 30.0–36.0)
MCV: 97.7 fl (ref 78.0–100.0)
Monocytes Absolute: 0.6 10*3/uL (ref 0.1–1.0)
Monocytes Relative: 10.6 % (ref 3.0–12.0)
Neutro Abs: 3.2 10*3/uL (ref 1.4–7.7)
Neutrophils Relative %: 61.4 % (ref 43.0–77.0)
Platelets: 208 10*3/uL (ref 150.0–400.0)
RBC: 4.33 Mil/uL (ref 3.87–5.11)
RDW: 12.6 % (ref 11.5–15.5)
WBC: 5.3 10*3/uL (ref 4.0–10.5)

## 2020-02-26 LAB — SEDIMENTATION RATE: Sed Rate: 8 mm/hr (ref 0–30)

## 2020-02-26 LAB — VITAMIN D 25 HYDROXY (VIT D DEFICIENCY, FRACTURES): VITD: 59.55 ng/mL (ref 30.00–100.00)

## 2020-02-26 LAB — COMPREHENSIVE METABOLIC PANEL
ALT: 20 U/L (ref 0–35)
AST: 34 U/L (ref 0–37)
Albumin: 4.6 g/dL (ref 3.5–5.2)
Alkaline Phosphatase: 64 U/L (ref 39–117)
BUN: 18 mg/dL (ref 6–23)
CO2: 32 mEq/L (ref 19–32)
Calcium: 9.5 mg/dL (ref 8.4–10.5)
Chloride: 99 mEq/L (ref 96–112)
Creatinine, Ser: 0.69 mg/dL (ref 0.40–1.20)
GFR: 95.6 mL/min (ref >60.00)
Glucose, Bld: 86 mg/dL (ref 70–99)
Potassium: 4.1 mEq/L (ref 3.5–5.1)
Sodium: 133 mEq/L — ABNORMAL LOW (ref 135–145)
Total Bilirubin: 0.5 mg/dL (ref 0.2–1.2)
Total Protein: 7.2 g/dL (ref 6.0–8.3)

## 2020-02-26 LAB — URIC ACID: Uric Acid, Serum: 2.7 mg/dL (ref 2.4–7.0)

## 2020-02-26 NOTE — Patient Instructions (Addendum)
Pelvic and lumbar xray Exercises for the back Labs today  See me again in 6 weeks after surgery

## 2020-02-26 NOTE — Assessment & Plan Note (Signed)
Attempted osteopathic manipulation today.  We did only muscle energy.  Patient had some stiffness noted.  We did not get significant movement.  Discussed some core strengthening.  Laboratory work-up ordered for the other secondary to the changes in the arthritic changes.

## 2020-02-26 NOTE — Assessment & Plan Note (Signed)
Mild radicular symptoms that seem to go down the left leg.  Patient will have x-rays of this as well as the pelvis.  Dressing there is some arthritic changes.  Discussed gabapentin which patient declined.  Possible further work-up will be necessary if this does not respond to the conservative therapy including the home exercises that were given with the athletic trainer.  Follow-up again in 6 weeks

## 2020-02-26 NOTE — Assessment & Plan Note (Signed)
Patient has significant arthritic changes of multiple joints.  Previously has had a positive ANA but a low titer.  Patient had seen a rheumatologist for a couple years but did not make any significant differences did not start on any other medications significantly.  Patient is trying to do things naturally and does not want to take other medicines on a regular basis if possible.  We discussed laboratory work-up to further evaluate including uric acid and a Lyme titer.  Patient's arthritis is significantly more pronounced than what would be anticipated for somebody her age and her activity level.  Depending on labs this could change medical management.  We will consider the possibility of osteopathic manipulation for some symptom relief the patient is undergoing shoulder replacement so we will hold at this time.  Follow-up again 6 weeks after surgery

## 2020-02-29 ENCOUNTER — Encounter: Payer: Self-pay | Admitting: Family Medicine

## 2020-03-03 LAB — ANA: Anti Nuclear Antibody (ANA): NEGATIVE

## 2020-03-03 LAB — B. BURGDORFI ANTIBODIES: B burgdorferi Ab IgG+IgM: <0.9 index

## 2020-03-03 LAB — PTH, INTACT AND CALCIUM
Calcium: 9.6 mg/dL (ref 8.6–10.4)
PTH: 20 pg/mL (ref 14–64)

## 2020-03-03 LAB — CALCIUM, IONIZED: Calcium, Ion: 5 mg/dL (ref 4.8–5.6)

## 2020-03-10 ENCOUNTER — Other Ambulatory Visit: Payer: Self-pay

## 2020-03-10 DIAGNOSIS — G8929 Other chronic pain: Secondary | ICD-10-CM

## 2020-03-11 ENCOUNTER — Other Ambulatory Visit: Payer: Self-pay

## 2020-03-11 DIAGNOSIS — M19011 Primary osteoarthritis, right shoulder: Secondary | ICD-10-CM

## 2020-03-11 DIAGNOSIS — M545 Low back pain, unspecified: Secondary | ICD-10-CM

## 2020-03-15 ENCOUNTER — Ambulatory Visit
Admission: RE | Admit: 2020-03-15 | Discharge: 2020-03-15 | Disposition: A | Discharge status: Home / Self Care | Payer: 59 | Source: Ambulatory Visit | Attending: Family Medicine | Admitting: Family Medicine

## 2020-03-15 ENCOUNTER — Encounter: Payer: Self-pay | Admitting: Family Medicine

## 2020-03-15 ENCOUNTER — Other Ambulatory Visit: Payer: Self-pay

## 2020-03-15 DIAGNOSIS — M545 Low back pain, unspecified: Secondary | ICD-10-CM

## 2020-03-15 DIAGNOSIS — M47816 Spondylosis without myelopathy or radiculopathy, lumbar region: Secondary | ICD-10-CM | POA: Diagnosis not present

## 2020-03-15 DIAGNOSIS — M5127 Other intervertebral disc displacement, lumbosacral region: Secondary | ICD-10-CM | POA: Diagnosis not present

## 2020-03-17 NOTE — Progress Notes (Addendum)
COVID Vaccine Completed:  x3 Date COVID Vaccine completed: 02-26-19, 03-19-19, 12-11-19 COVID vaccine manufacturer: [place]     PCP - Hollace Kinnier, DO Cardiologist - Lauree Chandler, MD.  Last OV 2012  Chest x-ray - N/A EKG - 04-23-19 in Epic Stress Test - 2012 ECHO - 2012 in [place] (recommended every 2 years) Cardiac Cath -  Pacemaker/ICD device last checked:  Sleep Study - N/A CPAP -   Fasting Blood Sugar - N/A Checks Blood Sugar _____ times a day  Blood Thinner Instructions: N/A Aspirin Instructions: Last Dose:  Anesthesia review: MVP.  Hx of chest pressure, dyspnea evaluated by cardiology in 2012  Patient denies shortness of breath, fever, cough and chest pain at PAT appointment.  Pt able to walk a flight of stairs and do housework.  Pt is a runner.   Patient verbalized understanding of instructions that were given to them at the PAT appointment. Patient was also instructed that they will need to review over the PAT instructions again at home before surgery.

## 2020-03-17 NOTE — Patient Instructions (Addendum)
DUE TO COVID-19 ONLY ONE VISITOR IS ALLOWED TO COME WITH YOU AND STAY IN THE WAITING ROOM ONLY DURING PRE OP AND PROCEDURE.   IF YOU WILL BE ADMITTED INTO THE HOSPITAL YOU ARE ALLOWED ONE SUPPORT PERSON DURING VISITATION HOURS ONLY (10AM -8PM)   . The support person may change daily. . The support person must pass our screening, gel in and out, and wear a mask at all times, including in the patient's room. . Patients must also wear a mask when staff or their support person are in the room.   COVID SWAB TESTING MUST BE COMPLETED ON:   Monday, 03-21-20 @ 9:50 AM   [address] [place] [id number]  (Must self quarantine after testing. Follow instructions on handout.)        Your procedure is scheduled on:  Thursday, 03-24-20   [facility]   Report to admitting at 7:30 AM   Call this number if you have problems the morning of surgery [phone]   Do not eat food :After Midnight.   May have liquids until 7:00 AM day of surgery  CLEAR LIQUID DIET  Foods Allowed                                                                     Foods Excluded  Water, Black Coffee and tea, regular and decaf             liquids that you cannot  Plain Jell-O in any flavor  (No red)                                    see through such as: Fruit ices (not with fruit pulp)                                      milk, soups, orange juice              Iced Popsicles (No red)                                      All solid food                                   Apple juices Sports drinks like Gatorade (No red) Lightly seasoned clear broth or consume(fat free)  Sugar, honey syrup     Complete one Ensure drink the morning of surgery at  7:00 AM the day of surgery.       1. The day of surgery:  ? Drink ONE (1) Pre-Surgery Clear Ensure or G2 by am the morning of surgery. Drink in one sitting. Do not sip.  ? This drink was given to you during your hospital  pre-op  appointment visit. ? Nothing else to drink after completing the  Pre-Surgery Clear Ensure or G2.          If you have  questions, please contact your surgeon's office.     Oral Hygiene is also important to reduce your risk of infection.                                    Remember - BRUSH YOUR TEETH THE MORNING OF SURGERY WITH YOUR REGULAR TOOTHPASTE   Do NOT smoke after Midnight   Take these medicines the morning of surgery with A SIP OF WATER:  Cetirizine                                 You may not have any metal on your body including hair pins, jewelry, and body piercings             Do not wear make-up, lotions, powders, perfumes/cologne, or deodorant             Do not wear nail polish.  Do not shave  48 hours prior to surgery.     Do not bring valuables to the hospital. [place].   Contacts, dentures or bridgework may not be worn into surgery.   Patients discharged the day of surgery will not be allowed to drive home.              Please read over the following fact sheets you were given: IF YOU HAVE QUESTIONS ABOUT YOUR PRE OP INSTRUCTIONS PLEASE CALL [place]- Preparing for Total Shoulder Arthroplasty    Before surgery, you can play an important role. Because skin is not sterile, your skin needs to be as free of germs as possible. You can reduce the number of germs on your skin by using the following products. . Benzoyl Peroxide Gel o Reduces the number of germs present on the skin o Applied twice a day to shoulder area starting two days before surgery    ==================================================================  Please follow these instructions carefully:  BENZOYL PEROXIDE 5% GEL  Please do not use if you have an allergy to benzoyl peroxide.   If your skin becomes reddened/irritated stop using the benzoyl peroxide.  Starting two days before surgery, apply as follows: 1. Apply benzoyl peroxide in  the morning and at night. Apply after taking a shower. If you are not taking a shower clean entire shoulder front, back, and side along with the armpit with a clean wet washcloth.  2. Place a quarter-sized dollop on your shoulder and rub in thoroughly, making sure to cover the front, back, and side of your shoulder, along with the armpit.   2 days before ____ AM   ____ PM              1 day before ____ AM   ____ PM                         3. Do this twice a day for two days.  (Last application is the night before surgery, AFTER using the CHG soap as described below).  4. Do NOT apply benzoyl peroxide gel on the day of surgery. [place] - Preparing for Surgery Before surgery, you can play an important role.  Because skin is not sterile, your skin needs to be as free of germs as possible.  You can reduce the number of germs  on your skin by washing with CHG (chlorahexidine gluconate) soap before surgery.  CHG is an antiseptic cleaner which kills germs and bonds with the skin to continue killing germs even after washing. Please DO NOT use if you have an allergy to CHG or antibacterial soaps.  If your skin becomes reddened/irritated stop using the CHG and inform your nurse when you arrive at Short Stay. Do not shave (including legs and underarms) for at least 48 hours prior to the first CHG shower.  You may shave your face/neck.  Please follow these instructions carefully:  1.  Shower with CHG Soap the night before surgery and the  morning of surgery.  2.  If you choose to wash your hair, wash your hair first as usual with your normal  shampoo.  3.  After you shampoo, rinse your hair and body thoroughly to remove the shampoo.                             4.  Use CHG as you would any other liquid soap.  You can apply chg directly to the skin and wash.  Gently with a scrungie or clean washcloth.  5.  Apply the CHG Soap to your body ONLY FROM THE NECK DOWN.   Do   not use on face/ open                            Wound or open sores. Avoid contact with eyes, ears mouth and   genitals (private parts).                       Wash face,  Genitals (private parts) with your normal soap.             6.  Wash thoroughly, paying special attention to the area where your    surgery  will be performed.  7.  Thoroughly rinse your body with warm water from the neck down.  8.  DO NOT shower/wash with your normal soap after using and rinsing off the CHG Soap.                9.  Pat yourself dry with a clean towel.            10.  Wear clean pajamas.            11.  Place clean sheets on your bed the night of your first shower and do not  sleep with pets. Day of Surgery : Do not apply any lotions/deodorants the morning of surgery.  Please wear clean clothes to the hospital/surgery center.  FAILURE TO FOLLOW THESE INSTRUCTIONS MAY RESULT IN THE CANCELLATION OF YOUR SURGERY  PATIENT SIGNATURE_________________________________  NURSE SIGNATURE__________________________________  ________________________________________________________________________   Tara [name]  An incentive spirometer is a tool that can help keep your lungs clear and active. This tool measures how well you are filling your lungs with each breath. Taking long deep breaths may help reverse or decrease the chance of developing breathing (pulmonary) problems (especially infection) following:  A long period of time when you are unable to move or be active. BEFORE THE PROCEDURE   If the spirometer includes an indicator to show your best effort, your nurse or respiratory therapist will set it to a desired goal.  If possible, sit up straight or lean slightly forward. Try not to slouch.  Hold  the incentive spirometer in an upright position. INSTRUCTIONS FOR USE  1. Sit on the edge of your bed if possible, or sit up as far as you can in bed or on a chair. 2. Hold the incentive spirometer in an upright position. 3. Breathe out  normally. 4. Place the mouthpiece in your mouth and seal your lips tightly around it. 5. Breathe in slowly and as deeply as possible, raising the piston or the ball toward the top of the column. 6. Hold your breath for 3-5 seconds or for as long as possible. Allow the piston or ball to fall to the bottom of the column. 7. Remove the mouthpiece from your mouth and breathe out normally. 8. Rest for a few seconds and repeat Steps 1 through 7 at least 10 times every 1-2 hours when you are awake. Take your time and take a few normal breaths between deep breaths. 9. The spirometer may include an indicator to show your best effort. Use the indicator as a goal to work toward during each repetition. 10. After each set of 10 deep breaths, practice coughing to be sure your lungs are clear. If you have an incision (the cut made at the time of surgery), support your incision when coughing by placing a pillow or rolled up towels firmly against it. Once you are able to get out of bed, walk around indoors and cough well. You may stop using the incentive spirometer when instructed by your caregiver.  RISKS AND COMPLICATIONS  Take your time so you do not get dizzy or light-headed.  If you are in pain, you may need to take or ask for pain medication before doing incentive spirometry. It is harder to take a deep breath if you are having pain. AFTER USE  Rest and breathe slowly and easily.  It can be helpful to keep track of a log of your progress. Your caregiver can provide you with a simple table to help with this. If you are using the spirometer at home, follow these instructions: Tara [name] IF:   You are having difficultly using the spirometer.  You have trouble using the spirometer as often as instructed.  Your pain medication is not giving enough relief while using the spirometer.  You develop fever of 100.5 F (38.1 C) or higher. SEEK IMMEDIATE MEDICAL CARE IF:   You cough up bloody sputum  that had not been present before.  You develop fever of 102 F (38.9 C) or greater.  You develop worsening pain at or near the incision site. MAKE SURE YOU:   Understand these instructions.  Will watch your condition.  Will get help right away if you are not doing well or get worse. Document Released: 06/18/2006 Document Revised: 04/30/2011 Document Reviewed: 08/19/2006 [facility] Patient Information 2014 [place], [place].   ________________________________________________________________________

## 2020-03-21 ENCOUNTER — Encounter (HOSPITAL_COMMUNITY): Payer: Self-pay

## 2020-03-21 ENCOUNTER — Encounter (HOSPITAL_COMMUNITY)
Admission: RE | Admit: 2020-03-21 | Discharge: 2020-03-21 | Disposition: A | Discharge status: Home / Self Care | Payer: 59 | Source: Ambulatory Visit | Attending: Orthopedic Surgery | Admitting: Orthopedic Surgery

## 2020-03-21 ENCOUNTER — Other Ambulatory Visit: Payer: Self-pay

## 2020-03-21 ENCOUNTER — Other Ambulatory Visit (HOSPITAL_COMMUNITY)
Admission: RE | Admit: 2020-03-21 | Discharge: 2020-03-21 | Disposition: A | Discharge status: Home / Self Care | Payer: 59 | Source: Ambulatory Visit | Attending: Orthopedic Surgery | Admitting: Orthopedic Surgery

## 2020-03-21 DIAGNOSIS — Z20822 Contact with and (suspected) exposure to covid-19: Secondary | ICD-10-CM | POA: Insufficient documentation

## 2020-03-21 DIAGNOSIS — Z01812 Encounter for preprocedural laboratory examination: Secondary | ICD-10-CM | POA: Diagnosis not present

## 2020-03-21 HISTORY — DX: Anemia, unspecified: D64.9

## 2020-03-21 HISTORY — DX: Other specified postprocedural states: R11.2

## 2020-03-21 HISTORY — DX: Other specified postprocedural states: Z98.890

## 2020-03-21 HISTORY — DX: Nausea with vomiting, unspecified: R11.2

## 2020-03-21 LAB — CBC
HCT: 44.2 % (ref 36.0–46.0)
Hemoglobin: 15 g/dL (ref 12.0–15.0)
MCH: 33.7 pg (ref 26.0–34.0)
MCHC: 33.9 g/dL (ref 30.0–36.0)
MCV: 99.3 fL (ref 80.0–100.0)
Platelets: 206 10*3/uL (ref 150–400)
RBC: 4.45 MIL/uL (ref 3.87–5.11)
RDW: 12.1 % (ref 11.5–15.5)
WBC: 4.8 10*3/uL (ref 4.0–10.5)
nRBC: 0 % (ref 0.0–0.2)

## 2020-03-21 LAB — BASIC METABOLIC PANEL
Anion gap: 9 (ref 5–15)
BUN: 17 mg/dL (ref 6–20)
CO2: 27 mmol/L (ref 22–32)
Calcium: 9.6 mg/dL (ref 8.9–10.3)
Chloride: 100 mmol/L (ref 98–111)
Creatinine, Ser: 0.48 mg/dL (ref 0.44–1.00)
GFR, Estimated: >60 mL/min (ref >60)
Glucose, Bld: 91 mg/dL (ref 70–99)
Potassium: 5.1 mmol/L (ref 3.5–5.1)
Sodium: 136 mmol/L (ref 135–145)

## 2020-03-21 LAB — SURGICAL PCR SCREEN
MRSA, PCR: NEGATIVE
Staphylococcus aureus: NEGATIVE

## 2020-03-21 LAB — SARS CORONAVIRUS 2 (TAT 6-24 HRS): SARS Coronavirus 2: NEGATIVE

## 2020-03-24 ENCOUNTER — Encounter (HOSPITAL_COMMUNITY): Payer: Self-pay | Admitting: Orthopedic Surgery

## 2020-03-24 ENCOUNTER — Ambulatory Visit (HOSPITAL_COMMUNITY): Payer: 59 | Admitting: Physician Assistant

## 2020-03-24 ENCOUNTER — Ambulatory Visit (HOSPITAL_COMMUNITY): Payer: 59 | Admitting: Anesthesiology

## 2020-03-24 ENCOUNTER — Ambulatory Visit (HOSPITAL_COMMUNITY)
Admission: RE | Admit: 2020-03-24 | Discharge: 2020-03-24 | Disposition: A | Discharge status: Home / Self Care | Payer: 59 | Source: Ambulatory Visit | Attending: Orthopedic Surgery | Admitting: Orthopedic Surgery

## 2020-03-24 ENCOUNTER — Encounter (HOSPITAL_COMMUNITY)
Admission: RE | Disposition: A | Discharge status: Home / Self Care | Payer: Self-pay | Source: Ambulatory Visit | Attending: Orthopedic Surgery

## 2020-03-24 DIAGNOSIS — M24411 Recurrent dislocation, right shoulder: Secondary | ICD-10-CM | POA: Insufficient documentation

## 2020-03-24 DIAGNOSIS — M25711 Osteophyte, right shoulder: Secondary | ICD-10-CM | POA: Diagnosis not present

## 2020-03-24 DIAGNOSIS — M19012 Primary osteoarthritis, left shoulder: Secondary | ICD-10-CM | POA: Diagnosis not present

## 2020-03-24 DIAGNOSIS — G8918 Other acute postprocedural pain: Secondary | ICD-10-CM | POA: Diagnosis not present

## 2020-03-24 DIAGNOSIS — Z79899 Other long term (current) drug therapy: Secondary | ICD-10-CM | POA: Insufficient documentation

## 2020-03-24 DIAGNOSIS — I10 Essential (primary) hypertension: Secondary | ICD-10-CM | POA: Diagnosis not present

## 2020-03-24 DIAGNOSIS — Z791 Long term (current) use of non-steroidal anti-inflammatories (NSAID): Secondary | ICD-10-CM | POA: Insufficient documentation

## 2020-03-24 DIAGNOSIS — M19011 Primary osteoarthritis, right shoulder: Secondary | ICD-10-CM | POA: Insufficient documentation

## 2020-03-24 DIAGNOSIS — Z823 Family history of stroke: Secondary | ICD-10-CM | POA: Diagnosis not present

## 2020-03-24 DIAGNOSIS — E7800 Pure hypercholesterolemia, unspecified: Secondary | ICD-10-CM | POA: Diagnosis not present

## 2020-03-24 HISTORY — PX: TOTAL SHOULDER ARTHROPLASTY: SHX126

## 2020-03-24 LAB — COMPREHENSIVE METABOLIC PANEL
ALT: 29 U/L (ref 0–44)
AST: 41 U/L (ref 15–41)
Albumin: 4.5 g/dL (ref 3.5–5.0)
Alkaline Phosphatase: 71 U/L (ref 38–126)
Anion gap: 9 (ref 5–15)
BUN: 16 mg/dL (ref 6–20)
CO2: 27 mmol/L (ref 22–32)
Calcium: 9.5 mg/dL (ref 8.9–10.3)
Chloride: 104 mmol/L (ref 98–111)
Creatinine, Ser: 0.67 mg/dL (ref 0.44–1.00)
GFR, Estimated: >60 mL/min (ref >60)
Glucose, Bld: 82 mg/dL (ref 70–99)
Potassium: 4.1 mmol/L (ref 3.5–5.1)
Sodium: 140 mmol/L (ref 135–145)
Total Bilirubin: 0.3 mg/dL (ref 0.3–1.2)
Total Protein: 7 g/dL (ref 6.5–8.1)

## 2020-03-24 LAB — TYPE AND SCREEN
ABO/RH(D): A POS
Antibody Screen: NEGATIVE

## 2020-03-24 LAB — ABO/RH: ABO/RH(D): A POS

## 2020-03-24 SURGERY — ARTHROPLASTY, SHOULDER, TOTAL
Anesthesia: Regional | Site: Shoulder | Laterality: Right

## 2020-03-24 MED ORDER — STERILE WATER FOR IRRIGATION IR SOLN
Status: DC | PRN
  Administered 2020-03-24: 2000 mL

## 2020-03-24 MED ORDER — CHLORHEXIDINE GLUCONATE 0.12 % MT SOLN
15.0000 mL | Freq: Once | OROMUCOSAL | Status: AC
  Administered 2020-03-24: 15 mL via OROMUCOSAL

## 2020-03-24 MED ORDER — BUPIVACAINE LIPOSOME 1.3 % IJ SUSP
INTRAMUSCULAR | Status: DC | PRN
  Administered 2020-03-24: 10 mL via PERINEURAL

## 2020-03-24 MED ORDER — LACTATED RINGERS IV BOLUS: 250.0000 mL | Freq: Once | INTRAVENOUS | Status: DC

## 2020-03-24 MED ORDER — LIDOCAINE 2% (20 MG/ML) 5 ML SYRINGE
INTRAMUSCULAR | Status: DC | PRN
  Administered 2020-03-24: 50 mg via INTRAVENOUS

## 2020-03-24 MED ORDER — ROCURONIUM BROMIDE 10 MG/ML (PF) SYRINGE
PREFILLED_SYRINGE | INTRAVENOUS | Status: DC | PRN
  Administered 2020-03-24: 70 mg via INTRAVENOUS

## 2020-03-24 MED ORDER — PROPOFOL 10 MG/ML IV BOLUS
INTRAVENOUS | Status: DC | PRN
  Administered 2020-03-24: 120 mg via INTRAVENOUS

## 2020-03-24 MED ORDER — ONDANSETRON HCL 4 MG/2ML IJ SOLN
INTRAMUSCULAR | Status: AC
  Filled 2020-03-24: qty 2

## 2020-03-24 MED ORDER — TRANEXAMIC ACID-NACL 1000-0.7 MG/100ML-% IV SOLN
1000.0000 mg | INTRAVENOUS | Status: AC
  Administered 2020-03-24: 1000 mg via INTRAVENOUS
  Filled 2020-03-24: qty 100

## 2020-03-24 MED ORDER — EPHEDRINE 5 MG/ML INJ
INTRAVENOUS | Status: AC
  Filled 2020-03-24: qty 10

## 2020-03-24 MED ORDER — VANCOMYCIN HCL 1000 MG IV SOLR
INTRAVENOUS | Status: AC
  Filled 2020-03-24: qty 1000

## 2020-03-24 MED ORDER — FENTANYL CITRATE (PF) 100 MCG/2ML IJ SOLN
INTRAMUSCULAR | Status: AC
  Filled 2020-03-24: qty 2

## 2020-03-24 MED ORDER — SCOPOLAMINE 1 MG/3DAYS TD PT72
1.0000 | MEDICATED_PATCH | TRANSDERMAL | Status: DC
  Administered 2020-03-24: 1.5 mg via TRANSDERMAL
  Filled 2020-03-24: qty 1

## 2020-03-24 MED ORDER — LIDOCAINE HCL (PF) 2 % IJ SOLN
INTRAMUSCULAR | Status: AC
  Filled 2020-03-24: qty 5

## 2020-03-24 MED ORDER — EPHEDRINE SULFATE-NACL 50-0.9 MG/10ML-% IV SOSY
PREFILLED_SYRINGE | INTRAVENOUS | Status: DC | PRN
  Administered 2020-03-24 (×2): 10 mg via INTRAVENOUS
  Administered 2020-03-24: 20 mg via INTRAVENOUS
  Administered 2020-03-24: 5 mg via INTRAVENOUS
  Administered 2020-03-24: 10 mg via INTRAVENOUS

## 2020-03-24 MED ORDER — AMISULPRIDE (ANTIEMETIC) 5 MG/2ML IV SOLN: 10.0000 mg | Freq: Once | INTRAVENOUS | Status: DC | PRN

## 2020-03-24 MED ORDER — DEXAMETHASONE SODIUM PHOSPHATE 10 MG/ML IJ SOLN
INTRAMUSCULAR | Status: DC | PRN
  Administered 2020-03-24: 10 mg via INTRAVENOUS

## 2020-03-24 MED ORDER — MIDAZOLAM HCL 2 MG/2ML IJ SOLN
INTRAMUSCULAR | Status: AC
  Filled 2020-03-24: qty 2

## 2020-03-24 MED ORDER — FENTANYL CITRATE (PF) 100 MCG/2ML IJ SOLN: 25.0000 ug | INTRAMUSCULAR | Status: DC | PRN

## 2020-03-24 MED ORDER — PROPOFOL 10 MG/ML IV BOLUS
INTRAVENOUS | Status: AC
  Filled 2020-03-24: qty 20

## 2020-03-24 MED ORDER — MIDAZOLAM HCL 2 MG/2ML IJ SOLN
INTRAMUSCULAR | Status: DC | PRN
  Administered 2020-03-24: 2 mg via INTRAVENOUS

## 2020-03-24 MED ORDER — VANCOMYCIN HCL 1000 MG IV SOLR
INTRAVENOUS | Status: DC | PRN
Start: 2020-03-24 — End: 2020-03-24
  Administered 2020-03-24: 1000 mg via TOPICAL

## 2020-03-24 MED ORDER — BUPIVACAINE HCL (PF) 0.5 % IJ SOLN
INTRAMUSCULAR | Status: DC | PRN
  Administered 2020-03-24: 20 mL via PERINEURAL

## 2020-03-24 MED ORDER — ORAL CARE MOUTH RINSE: 15.0000 mL | Freq: Once | OROMUCOSAL | Status: AC

## 2020-03-24 MED ORDER — CEFAZOLIN SODIUM-DEXTROSE 2-4 GM/100ML-% IV SOLN
2.0000 g | INTRAVENOUS | Status: AC
  Administered 2020-03-24: 2 g via INTRAVENOUS
  Filled 2020-03-24: qty 100

## 2020-03-24 MED ORDER — PROMETHAZINE HCL 25 MG/ML IJ SOLN
6.2500 mg | INTRAMUSCULAR | Status: DC | PRN
Start: 2020-03-24 — End: 2020-03-24

## 2020-03-24 MED ORDER — ROCURONIUM BROMIDE 10 MG/ML (PF) SYRINGE
PREFILLED_SYRINGE | INTRAVENOUS | Status: AC
  Filled 2020-03-24: qty 10

## 2020-03-24 MED ORDER — FENTANYL CITRATE (PF) 250 MCG/5ML IJ SOLN
INTRAMUSCULAR | Status: DC | PRN
  Administered 2020-03-24: 25 ug via INTRAVENOUS
  Administered 2020-03-24: 50 ug via INTRAVENOUS

## 2020-03-24 MED ORDER — DEXAMETHASONE SODIUM PHOSPHATE 10 MG/ML IJ SOLN
INTRAMUSCULAR | Status: AC
  Filled 2020-03-24: qty 1

## 2020-03-24 MED ORDER — ONDANSETRON HCL 4 MG/2ML IJ SOLN
INTRAMUSCULAR | Status: DC | PRN
  Administered 2020-03-24: 4 mg via INTRAVENOUS

## 2020-03-24 MED ORDER — 0.9 % SODIUM CHLORIDE (POUR BTL) OPTIME
TOPICAL | Status: DC | PRN
  Administered 2020-03-24: 1000 mL

## 2020-03-24 MED ORDER — ACETAMINOPHEN 500 MG PO TABS
1000.0000 mg | ORAL_TABLET | Freq: Once | ORAL | Status: AC
  Administered 2020-03-24: 1000 mg via ORAL
  Filled 2020-03-24: qty 2

## 2020-03-24 MED ORDER — SUGAMMADEX SODIUM 200 MG/2ML IV SOLN
INTRAVENOUS | Status: DC | PRN
  Administered 2020-03-24: 200 mg via INTRAVENOUS

## 2020-03-24 MED ORDER — LACTATED RINGERS IV BOLUS
500.0000 mL | Freq: Once | INTRAVENOUS | Status: AC
  Administered 2020-03-24: 500 mL via INTRAVENOUS

## 2020-03-24 MED ORDER — LACTATED RINGERS IV SOLN: INTRAVENOUS | Status: DC

## 2020-03-24 MED ORDER — FENTANYL CITRATE (PF) 100 MCG/2ML IJ SOLN
50.0000 ug | Freq: Once | INTRAMUSCULAR | Status: AC
  Administered 2020-03-24: 50 ug via INTRAVENOUS
  Filled 2020-03-24: qty 2

## 2020-03-24 MED ORDER — MIDAZOLAM HCL 2 MG/2ML IJ SOLN
1.0000 mg | Freq: Once | INTRAMUSCULAR | Status: AC
  Administered 2020-03-24: 2 mg via INTRAVENOUS
  Filled 2020-03-24: qty 2

## 2020-03-24 SURGICAL SUPPLY — 74 items
ADH SKN CLS APL DERMABOND .7 (GAUZE/BANDAGES/DRESSINGS) ×1
AID PSTN UNV HD RSTRNT DISP (MISCELLANEOUS) ×1
BAG SPEC THK2 15X12 ZIP CLS (MISCELLANEOUS) ×1
BAG ZIPLOCK 12X15 (MISCELLANEOUS) ×2 IMPLANT
BASEPLATE AUG FULL 24X20 +2 (Plate) ×1 IMPLANT
BIT DRILL 2.0X128 (BIT) ×2 IMPLANT
BLADE SAW SGTL 83.5X18.5 (BLADE) ×2 IMPLANT
CALIBRATOR GLENOID VIP 5-D (SYSTAGENIX WOUND MANAGEMENT) ×1 IMPLANT
COOLER ICEMAN CLASSIC (MISCELLANEOUS) IMPLANT
COVER BACK TABLE 60X90IN (DRAPES) ×2 IMPLANT
COVER SURGICAL LIGHT HANDLE (MISCELLANEOUS) ×2 IMPLANT
COVER WAND RF STERILE (DRAPES) IMPLANT
CUP SUT UNIV REVERS 36 NEUTRAL (Cup) ×1 IMPLANT
DERMABOND ADVANCED (GAUZE/BANDAGES/DRESSINGS) ×1
DERMABOND ADVANCED .7 DNX12 (GAUZE/BANDAGES/DRESSINGS) ×1 IMPLANT
DRAPE ORTHO SPLIT 77X108 STRL (DRAPES) ×4
DRAPE SHEET LG 3/4 BI-LAMINATE (DRAPES) ×2 IMPLANT
DRAPE SURG 17X11 SM STRL (DRAPES) ×2 IMPLANT
DRAPE SURG ORHT 6 SPLT 77X108 (DRAPES) ×2 IMPLANT
DRAPE U-SHAPE 47X51 STRL (DRAPES) ×2 IMPLANT
DRSG AQUACEL AG ADV 3.5X 6 (GAUZE/BANDAGES/DRESSINGS) ×1 IMPLANT
DRSG AQUACEL AG ADV 3.5X10 (GAUZE/BANDAGES/DRESSINGS) ×2 IMPLANT
DURAPREP 26ML APPLICATOR (WOUND CARE) ×2 IMPLANT
ELECT BLADE TIP CTD 4 INCH (ELECTRODE) ×2 IMPLANT
ELECT REM PT RETURN 15FT ADLT (MISCELLANEOUS) ×2 IMPLANT
FACESHIELD WRAPAROUND (MASK) ×8 IMPLANT
FACESHIELD WRAPAROUND OR TEAM (MASK) ×4 IMPLANT
GLENOSPHERE 36 +4 LAT/24 (Joint) ×1 IMPLANT
GLOVE BIO SURGEON STRL SZ7.5 (GLOVE) ×2 IMPLANT
GLOVE SS BIOGEL STRL SZ 7 (GLOVE) ×1 IMPLANT
GLOVE SS BIOGEL STRL SZ 7.5 (GLOVE) ×1 IMPLANT
GLOVE SUPERSENSE BIOGEL SZ 7 (GLOVE) ×1
GLOVE SUPERSENSE BIOGEL SZ 7.5 (GLOVE) ×1
GLOVE SURG ENC MOIS LTX SZ8 (GLOVE) ×2 IMPLANT
GOWN STRL REUS W/TWL LRG LVL3 (GOWN DISPOSABLE) ×4 IMPLANT
KIT BASIN OR (CUSTOM PROCEDURE TRAY) ×2 IMPLANT
KIT TURNOVER KIT A (KITS) IMPLANT
LINER HUMERAL 36 +3MM SM (Shoulder) ×1 IMPLANT
MANIFOLD NEPTUNE II (INSTRUMENTS) ×2 IMPLANT
NDL TAPERED W/ NITINOL LOOP (MISCELLANEOUS) IMPLANT
NEEDLE TAPERED W/ NITINOL LOOP (MISCELLANEOUS) IMPLANT
NS IRRIG 1000ML POUR BTL (IV SOLUTION) ×2 IMPLANT
PACK SHOULDER (CUSTOM PROCEDURE TRAY) ×2 IMPLANT
PAD COLD SHLDR WRAP-ON (PAD) IMPLANT
PAD ORTHO SHOULDER 7X19 LRG (SOFTGOODS) ×1 IMPLANT
PIN NITINOL TARGETER 2.8 (PIN) ×1 IMPLANT
PIN SET MODULAR GLENOID SYSTEM (PIN) IMPLANT
POST MODULAR 25 (Post) ×2 IMPLANT
POST MODULAR MGS BASEPLATE 25 (Post) IMPLANT
PROTECTOR NERVE ULNAR (MISCELLANEOUS) ×2 IMPLANT
REAMER ANGLED HEAD SMALL (DRILL) ×1 IMPLANT
RESTRAINT HEAD UNIVERSAL NS (MISCELLANEOUS) ×2 IMPLANT
SCREW PERI NL 4.5X32 (Screw) ×1 IMPLANT
SCREW PERIPHERAL 5.5X20 LOCK (Screw) ×2 IMPLANT
SCREW PERIPHERAL 5.5X28 LOCK (Screw) ×1 IMPLANT
SLING ARM FOAM STRAP LRG (SOFTGOODS) IMPLANT
SLING ARM FOAM STRAP MED (SOFTGOODS) ×1 IMPLANT
SMARTMIX MINI TOWER (MISCELLANEOUS)
SPONGE LAP 18X18 RF (DISPOSABLE) IMPLANT
STEM HUMERAL UNI REVERS SZ6 (Stem) ×1 IMPLANT
SUCTION FRAZIER HANDLE 12FR (TUBING) ×2
SUCTION TUBE FRAZIER 12FR DISP (TUBING) ×1 IMPLANT
SUT FIBERWIRE #2 38 T-5 BLUE (SUTURE)
SUT MNCRL AB 3-0 PS2 18 (SUTURE) ×2 IMPLANT
SUT MON AB 2-0 CT1 36 (SUTURE) ×2 IMPLANT
SUT VIC AB 1 CT1 36 (SUTURE) ×2 IMPLANT
SUTURE FIBERWR #2 38 T-5 BLUE (SUTURE) IMPLANT
SUTURE TAPE 1.3 40 TPR END (SUTURE) IMPLANT
SUTURETAPE 1.3 40 TPR END (SUTURE)
TOWEL OR 17X26 10 PK STRL BLUE (TOWEL DISPOSABLE) ×2 IMPLANT
TOWEL OR NON WOVEN STRL DISP B (DISPOSABLE) ×2 IMPLANT
TOWER SMARTMIX MINI (MISCELLANEOUS) IMPLANT
WATER STERILE IRR 1000ML POUR (IV SOLUTION) ×4 IMPLANT
YANKAUER SUCT BULB TIP 10FT TU (MISCELLANEOUS) ×1 IMPLANT

## 2020-03-24 NOTE — Progress Notes (Signed)
AssistedDr. Elgie Congo with right, ultrasound guided, interscalene  block. Side rails up, monitors on throughout procedure. See vital signs in flow sheet. Tolerated Procedure well.

## 2020-03-24 NOTE — Anesthesia Procedure Notes (Signed)
Anesthesia Regional Block: Interscalene brachial plexus block   Pre-Anesthetic Checklist: ,, timeout performed, Correct Patient, Correct Site, Correct Laterality, Correct Procedure, Correct Position, site marked, Risks and benefits discussed,  Surgical consent,  Pre-op evaluation,  At surgeon's request and post-op pain management  Laterality: Right  Prep: chloraprep       Needles:  Injection technique: Single-shot  Needle Type: Echogenic Stimulator Needle     Needle Length: 10cm  Needle Gauge: 20     Additional Needles:   Procedures:,,,, ultrasound used (permanent image in chart),,,,  Narrative:  Start time: 03/24/2020 8:30 AM End time: 03/24/2020 8:35 AM Injection made incrementally with aspirations every 5 mL.  Performed by: Personally  Anesthesiologist: Merlinda Frederick, MD  Additional Notes: Standard monitors applied. Skin prepped. Good needle visualization with ultrasound. Injection made in 5cc increments with no resistance to injection. Patient tolerated the procedure well.

## 2020-03-24 NOTE — Anesthesia Postprocedure Evaluation (Signed)
Anesthesia Post Note  Patient: Tara [name]  Procedure(s) Performed: RIGHT REVERSE SHOULDER ARTHROPLASTY (Right Shoulder)     Patient location during evaluation: PACU Anesthesia Type: Regional and General Level of consciousness: awake Pain management: pain level controlled Vital Signs Assessment: post-procedure vital signs reviewed and stable Respiratory status: spontaneous breathing and respiratory function stable Cardiovascular status: stable Postop Assessment: no apparent nausea or vomiting Anesthetic complications: no   No complications documented.  Last Vitals:  Vitals:   03/24/20 1245 03/24/20 1345  BP: 123/81 112/76  Pulse: (!) 56 65  Resp: 13   Temp:    SpO2: 98% 95%    Last Pain:  Vitals:   03/24/20 1245  PainSc: 0-No pain                 Candra R [name]

## 2020-03-24 NOTE — Op Note (Signed)
03/24/2020  11:59 AM  PATIENT:   Tara [name]  60 y.o. female  PRE-OPERATIVE DIAGNOSIS:  Right shoulder osteoarthritis with severe bony deformity including marked retroversion and almost 100% posterior humeral head subluxation  POST-OPERATIVE DIAGNOSIS: Same  PROCEDURE: Right shoulder reverse arthroplasty utilizing a press-fit size 6 Arthrex stem with a neutral metaphysis, +3 polyethylene insert, 36/+4 glenosphere with a 20 degree augment baseplate/+2  SURGEON:  Ambri Miltner, Metta Clines M.D.  ASSISTANTS: Jenetta Loges, PA-C  ANESTHESIA:   General endotracheal and interscalene block with Exparel  EBL: 200 cc  SPECIMEN: None  Drains: None   PATIENT DISPOSITION:  PACU - hemodynamically stable.    PLAN OF CARE: Discharge to home after PACU  Brief history:  Tara [name] is a 60 year old female who has a long history of severe bilateral shoulder pain with increasing functional rotations and profoundly restricted mobility related to underlying advanced osteoarthritis with associated severe bony deformity.  Her symptoms have now deteriorated to the point that she is significantly impacted with profound limitations in her ability to perform activities of daily living.  She is brought to the operating room at this time for planned shoulder arthroplasty anatomic versus reverse.  Preoperatively, I counseled the patient regarding treatment options and risks versus benefits thereof.  Possible surgical complications were all reviewed including potential for bleeding, infection, neurovascular injury, persistent pain, loss of motion, anesthetic complication, failure of the implant, and possible need for additional surgery. They understand and accept and agrees with our planned procedure. I have also counseled the patient regarding anatomic versus reverse arthroplasty and the indications for each.  Upon further review of all of her preoperative studies and given the severe bone deformity and marked  subluxation of the joint it is my impression that she would be best served with a reverse arthroplasty which was the primary focus as we approach the surgery.  Procedure in detail:  After undergoing routine preop evaluation the patient received prophylactic antibiotics and interscalene block with Exparel was established in the holding area by the anesthesia department.  Patient was subsequently placed supine on the operating table and underwent the smooth induction of a general endotracheal anesthesia.  Subsequently placed into the beachchair position and appropriately padded and protected.  The right shoulder girdle region was then sterilely prepped and draped in standard fashion.  Timeout was called.  An anterior deltopectoral approach to the right shoulder is made through an 8 cm incision.  Skin flaps were elevated dissection carried deeply and electrocautery was used for hemostasis.  The deltopectoral interval was then developed from proximal to distal with the vein taken laterally.  Conjoined tendon was mobilized and retracted medially and adhesions were divided beneath the deltoid.  We identified the long head biceps tendon which we tenodesed at the upper border the pectoralis major tendon.  At this point we identified the severe bony deformity that had developed about the proximal humerus with very large osteophytes anteriorly and inferiorly.  The bicep was unroofed and excised proximally and then we split the rotator cuff along the rotator interval towards the base of the coracoid and then performed a peel of the subscapularis from its lesser tuberosity insertion.  The free margin was tagged.  We then continued our subperiosteal dissection around the anterior and infra margins of the humeral neck and there is a very large anterior and inferior osteophyte that we carefully exposed using subperiosteal dissection.  We did find that the shoulder joint was completely ankylosed with no significant motion at  the glenohumeral articulation due to the severe bone deformity with flattening of the humeral head.  We did ultimately freed the surrounding soft tissues to deliver the humeral head through the wound and then using her extra medullary guide outlined our proposed humeral head resection which was then performed with an oscillating saw at approximate 30 degrees retroversion.  We were then able to remove the remnant of the very large osteophyte on the anterior and medial margins of the humeral neck identifying the anatomic margins of the humeral head.  We did identify the rotator cuff as being intact both superiorly and posteriorly which we protected.  A metal cap was then placed over the cut proximal humeral surface and at this point we then exposed the glenoid with appropriate retractors and performed a circumferential labral resection.  The glenoid showed severe retroversion with a B2 deformity.  With these findings we utilized the preoperative surgical plan we developed from her preoperative CT scan and with the appropriate guide placed our guidepin into the center of the glenoid and reamed with a 20 degree posterior offset angled reamer achieving a solid stable subchondral bony surface.  Preparation was then completed with the central drill.  We assembled the baseplate with a 25 mm post and the 20 degree augmented baseplate was then impacted with excellent fit and fixation and we did place some vancomycin powder on the post of the baseplate.  Once it was seated we then placed a single lag screw through a most inferior hole on the baseplate to achieve further seating of the baseplate with good purchase achieved.  The balance of the baseplate screws were then of the locking variety and these again all achieved excellent fit and fixation.  We then placed a 36/+4 glenosphere onto the baseplate which was impacted and the central locking screw was placed.  At this point we returned our attention to the proximal humerus  and open the canal and ultimately broached up to a size 8 and prepared the metaphysis.  We found however that this did not allow proper soft tissue balancing for reduction and so we downsized sequentially and ultimately found that a size 6 stem appropriately seated with metaphyseal reaming in the neutral position ultimately allowed proper reduction of the shoulder with good soft tissue balance.  We did find a large metaphyseal defect from six a subchondral cyst in the metaphysis and the harvested bone from the resected humeral head to fill this area when our final prosthesis was implanted.  Our final size 6 prosthesis with neutral metaphysis was assembled coated with vancomycin powder and then partially inserted then the bone graft was then placed around the cystic areas in the humeral metaphysis and then terminal seeding was completed.  We then performed trial reduction and ultimately felt the +3 polygave [place] good soft tissue balance stability and motion.  Our final +3 polywas then impacted into the implant and final reduction was then completed.  We are very pleased with the overall mobility position and stability and soft tissue balance.  At this point the joint was copiously irrigated.  The subscapularis was mobilized and did show good elasticity and so we repaired the subscap back to the eyelets on the collar of our implant.  Final irrigation was completed.  The balance of the vancomycin powder was then placed throughout the deep soft tissue planes.  The deltopectoral interval was reapproximated with a series of figure-of-eight and 1 Vicryl suture.  2-0 Vicryl used in the subcu layer and  intracuticular 3-0 Monocryl for the skin followed by Dermabond and Aquacel dressing.  The right arm was placed into a sling.  The patient was awakened, extubated, and taken to the recovery him in stable condition.  Jenetta Loges, PA-C was utilized as an Environmental consultant throughout this case, essential for help with positioning the  patient, positioning extremity, tissue manipulation, implantation of the prosthesis, suture management, wound closure, and intraoperative decision-making.  Marin Shutter MD   Contact # [phone]

## 2020-03-24 NOTE — Evaluation (Signed)
Occupational Therapy Evaluation Patient Details Name: Tara [name] MRN: [id number] DOB: 10-Aug-1960 Today's Date: 03/24/2020    History of Present Illness Patient is a 60 year old female admitted 2/3 for R reverse total shoulder arthroplasty. PMH includes shoulder arthroscopy and knee surgery   Clinical Impression   s/p shoulder replacement without functional use of right dominant upper extremity secondary to effects of surgery and interscalene block and shoulder precautions. Therapist provided education and instruction to patient and spouse in regards to exercises, precautions, positioning, donning upper extremity clothing and bathing while maintaining shoulder precautions, ice and edema management and donning/doffing sling. Patient and spouse verbalized understanding and demonstrated as needed. Patient needed assistance to donn shirt, underwear, pants, socks and shoes and provided with instruction on compensatory strategies to perform ADLs. Patient to follow up with MD for further therapy needs.      Follow Up Recommendations  Follow surgeon's recommendation for DC plan and follow-up therapies    Equipment Recommendations  None recommended by OT       Precautions / Restrictions Precautions Precautions: Shoulder Type of Shoulder Precautions: If sitting in controlled environment, ok to come out of sling to give neck a break. Please sleep in it to protect until follow up in office. OK to use operative arm for feeding, hygiene and ADLs. Ok to instruct Pendulums and lap slides as exercises. Ok to use operative arm within the following parameters for ADL purposes New ROM (8/18)  Ok for PROM, AAROM, AROM within pain tolerance and within the following ROM ER 20 ABD 45 FE 60 Shoulder Interventions: Shoulder sling/immobilizer;Off for dressing/bathing/exercises Precaution Booklet Issued: Yes (comment) Required Braces or Orthoses: Sling Restrictions Weight Bearing Restrictions: Yes RUE Weight  Bearing: Non weight bearing      Mobility Bed Mobility               General bed mobility comments: in chair    Transfers Overall transfer level: Independent                    Balance Overall balance assessment: Independent                                         ADL either performed or assessed with clinical judgement   ADL Overall ADL's : Needs assistance/impaired Eating/Feeding: Independent   Grooming: Independent   Upper Body Bathing: Minimal assistance   Lower Body Bathing: Supervison/ safety   Upper Body Dressing : Minimal assistance;Cueing for sequencing;Cueing for UE precautions Upper Body Dressing Details (indicate cue type and reason): spouse assist patient with threading R UE Lower Body Dressing: Minimal assistance   Toilet Transfer: Independent   Toileting- Clothing Manipulation and Hygiene: Independent       Functional mobility during ADLs: Independent General ADL Comments: patient and spouse educated in compensatory strategies for ADLs in order to maintain shoulder precautions, verbalized understanding                  Pertinent Vitals/Pain Pain Assessment: Faces Faces Pain Scale: Hurts a little bit Pain Location: R shoulder Pain Descriptors / Indicators: Numbness Pain Intervention(s): Monitored during session     Hand Dominance Right   Extremity/Trunk Assessment Upper Extremity Assessment Upper Extremity Assessment: RUE deficits/detail RUE Deficits / Details: + nerve block   Lower Extremity Assessment Lower Extremity Assessment: Defer to PT evaluation   Cervical / Trunk Assessment Cervical /  Trunk Assessment: Normal   Communication Communication Communication: No difficulties   Cognition Arousal/Alertness: Awake/alert Behavior During Therapy: WFL for tasks assessed/performed Overall Cognitive Status: Within Functional Limits for tasks assessed                                         Exercises Exercises: Shoulder   Shoulder Instructions Shoulder Instructions Donning/doffing shirt without moving shoulder: Minimal assistance;Caregiver independent with task;Patient able to independently direct caregiver Method for sponge bathing under operated UE: Minimal assistance;Caregiver independent with task;Patient able to independently direct caregiver Donning/doffing sling/immobilizer: Caregiver independent with task;Patient able to independently direct caregiver Correct positioning of sling/immobilizer: Caregiver independent with task;Patient able to independently direct caregiver Pendulum exercises (written home exercise program): Caregiver independent with task;Patient able to independently direct caregiver ROM for elbow, wrist and digits of operated UE: Caregiver independent with task;Patient able to independently direct caregiver Sling wearing schedule (on at all times/off for ADL's): Caregiver independent with task;Patient able to independently direct caregiver Proper positioning of operated UE when showering: Caregiver independent with task;Patient able to independently direct caregiver Positioning of UE while sleeping: Caregiver independent with task;Patient able to independently direct caregiver    Home Living Family/patient expects to be discharged to:: Private residence Living Arrangements: Spouse/significant other Available Help at Discharge: Family Type of Home: House Home Access: Stairs to enter [employer] of Steps: 1 small step   Home Layout: One level;Laundry or work area in basement     [employer]: Occupational psychologist: Handicapped height     [place]: None          Prior Functioning/Environment Level of Independence: Independent                 OT Problem List: Pain;Impaired UE functional use         OT Goals(Current goals can be found in the care plan section) Acute Rehab OT Goals Patient Stated  Goal: "I hope my shoulder lasts" OT Goal Formulation: All assessment and education complete, DC therapy   AM-PAC OT "6 Clicks" Daily Activity     Outcome Measure Help from another person eating meals?: None Help from another person taking care of personal grooming?: A Little Help from another person toileting, which includes using toliet, bedpan, or urinal?: A Little Help from another person bathing (including washing, rinsing, drying)?: A Little Help from another person to put on and taking off regular upper body clothing?: A Little Help from another person to put on and taking off regular lower body clothing?: A Little 6 Click Score: 19   End of Session Equipment Utilized During Treatment: Other (comment) (sling) Nurse Communication: Mobility status  Activity Tolerance: Patient tolerated treatment well Patient left: in chair;with call bell/phone within reach;with family/visitor present  OT Visit Diagnosis: Pain Pain - Right/Left: Right Pain - part of body: Shoulder                Time: 9518-8416 OT Time Calculation (min): 32 min Charges:  OT General Charges $OT Visit: 1 Visit OT Evaluation $OT Eval Low Complexity: 1 Low OT Treatments $Self Care/Home Management : 8-22 mins  Delbert Phenix OT OT pager: [place] 03/24/2020, 2:58 PM

## 2020-03-24 NOTE — Anesthesia Preprocedure Evaluation (Addendum)
Anesthesia Evaluation  Patient identified by MRN, date of birth, ID band Patient awake    Reviewed: Allergy & Precautions, NPO status , Patient's Chart, lab work & pertinent test results  History of Anesthesia Complications (+) PONV and history of anesthetic complications  Airway Mallampati: I  TM Distance: >3 FB Neck ROM: Full    Dental no notable dental hx.    Pulmonary    Pulmonary exam normal breath sounds clear to auscultation       Cardiovascular Exercise Tolerance: Good METS: 5 - 7 Mets hypertension, Normal cardiovascular exam+ Valvular Problems/Murmurs MVP  Rhythm:Regular Rate:Normal  NSR rate 62   Neuro/Psych  Headaches, negative psych ROS   GI/Hepatic negative GI ROS, Neg liver ROS,   Endo/Other  negative endocrine ROS  Renal/GU negative Renal ROS  negative genitourinary   Musculoskeletal  (+) Arthritis ,   Abdominal Normal abdominal exam  (+)   Peds negative pediatric ROS (+)  Hematology   Anesthesia Other Findings   Reproductive/Obstetrics negative OB ROS                            Anesthesia Physical Anesthesia Plan  ASA: II  Anesthesia Plan: General and Regional   Post-op Pain Management: GA combined w/ Regional for post-op pain   Induction:   PONV Risk Score and Plan: 3 and Scopolamine patch - Pre-op, Midazolam, Dexamethasone, Ondansetron and Treatment may vary due to age or medical condition  Airway Management Planned: Oral ETT  Additional Equipment: None  Intra-op Plan:   Post-operative Plan: Extubation in OR  Informed Consent: I have reviewed the patients History and Physical, chart, labs and discussed the procedure including the risks, benefits and alternatives for the proposed anesthesia with the patient or authorized representative who has indicated his/her understanding and acceptance.     Dental advisory given  Plan Discussed with: CRNA,  Anesthesiologist and Surgeon  Anesthesia Plan Comments:        Anesthesia Quick Evaluation

## 2020-03-24 NOTE — H&P (Signed)
Tara [name]    Chief Complaint: Right shoulder osteoarthritis HPI: The patient is a 60 y.o. female with chronic and progressively increasing right shoulder pain related to severe osteoarthritis and associated bony deformity.  Her symptoms have failed prolonged attempts at conservative management and she is brought to the operating room at this time for planned right shoulder arthroplasty anatomic versus reverse.  Past Medical History:  Diagnosis Date  . Anemia   . Arthritis   . Hyperlipidemia   . Insomnia   . Mitral valve prolapse   . PONV (postoperative nausea and vomiting)     Past Surgical History:  Procedure Laterality Date  . AUGMENTATION MAMMAPLASTY Bilateral    Dec 2019  . BREAST BIOPSY Right    axillary node benign  . [place]   right  . SHOULDER ARTHROSCOPY WITH LABRAL REPAIR Right 08/28/2012   Procedure: RIGHT SHOULDER ARTHROSCOPY WITH LABRAL DEBRIDEMENT, ASPIRATION AND DECOMPRESSION OF PARALABRAL CYST;  Surgeon: Tara Shutter, MD;  Location: [facility];  Service: Orthopedics;  Laterality: Right;    Family History  Problem Relation Age of Onset  . Stroke Mother   . Sudden death Neg Hx   . Hypertension Neg Hx   . Hyperlipidemia Neg Hx   . Heart attack Neg Hx   . Diabetes Neg Hx     Social History:  reports that she has never smoked. She has never used smokeless tobacco. She reports that she does not drink alcohol and does not use drugs.   Medications Prior to Admission  Medication Sig Dispense Refill  . CALCIUM CITRATE PO Take 400 mg by mouth 3 (three) times daily. Number varies each day based on calcium ingested in food    . cetirizine (ZYRTEC) 10 MG tablet Take 10 mg by mouth daily as needed for allergies or rhinitis.    . Fluocin-Hydroquinone-Tretinoin (TRI-LUMA EX) Apply 1 application topically at bedtime.    . Multiple Vitamin (MULTIVITAMIN WITH MINERALS) TABS Take 1 tablet by mouth daily.    . Omega-3 Fatty Acids (FISH OIL) 1000 MG CAPS Take  1,000 mg by mouth 2 (two) times daily. Vit d3 2000 IU    . OVER THE COUNTER MEDICATION Take 2 tablets by mouth at bedtime. Formula 303  supplement for pain and rest    . OVER THE COUNTER MEDICATION Take 600 mg by mouth 2 (two) times daily. Red yeast rice    . tretinoin (RETIN-A) 0.05 % cream Apply 1 application topically at bedtime. (Patient taking differently: Apply 1 application topically daily.) 45 g 3  . celecoxib (CELEBREX) 200 MG capsule Take 200 mg by mouth daily.    . cyclobenzaprine (FLEXERIL) 10 MG tablet Take 5-10 mg by mouth every 8 (eight) hours as needed.    . fluticasone (FLONASE) 50 MCG/ACT nasal spray Place 1 spray into both nostrils 3 (three) times daily as needed for rhinitis or allergies.    Marland Kitchen ondansetron (ZOFRAN) 4 MG tablet Take 4 mg by mouth every 6 (six) hours as needed.    . traMADol (ULTRAM) 50 MG tablet Take 50 mg by mouth every 4 (four) hours as needed.       Physical Exam: Right shoulder demonstrates painful and guarded motion as noted at recent office visits.  She does however maintain good strength to manual muscle testing although this is painful for her.  Plain radiographs confirm severe arthritis with complete loss of joint space and large peripheral osteophytes as well as moderate bony deformity.  Reoperative MRI scan shows the rotator cuff to be intact.  Preoperative CT scan used for surgical planning reviewed demonstrating the underlying bony deformity with options for anatomic versus reverse arthroplasty based on the intraoperative findings.  Vitals  Temp:  [98.1 F (36.7 C)] 98.1 F (36.7 C) (02/03 0750) Pulse Rate:  [50-58] 50 (02/03 0840) Resp:  [10-16] 16 (02/03 0840) BP: (93-117)/(71-83) 93/71 (02/03 0840) SpO2:  [100 %] 100 % (02/03 0840) Weight:  [62.9 kg] 62.9 kg (02/03 0741)  Assessment/Plan  Impression: Right shoulder osteoarthritis  Plan of Action: Procedure(s): Right shoulder anatomic arthroplasty; possible reverse  arthroplasty  Tara [name] M Tara [name] 03/24/2020, 8:51 AM Contact # [phone]

## 2020-03-24 NOTE — Anesthesia Procedure Notes (Signed)
Procedure Name: Intubation Date/Time: 03/24/2020 9:56 AM Performed by: Michele Rockers, CRNA Pre-anesthesia Checklist: Patient identified, Patient being monitored, Timeout performed, Emergency Drugs available and Suction available Patient Re-evaluated:Patient Re-evaluated prior to induction Oxygen Delivery Method: Circle System Utilized Preoxygenation: Pre-oxygenation with 100% oxygen Induction Type: IV induction Ventilation: Mask ventilation without difficulty Laryngoscope Size: Miller and 2 Grade View: Grade I Tube type: Oral Tube size: 7.0 mm Number of attempts: 1 Airway Equipment and Method: Stylet Placement Confirmation: ETT inserted through vocal cords under direct vision,  positive ETCO2 and breath sounds checked- equal and bilateral Secured at: 21 cm Tube secured with: Tape Dental Injury: Teeth and Oropharynx as per pre-operative assessment

## 2020-03-24 NOTE — Discharge Instructions (Signed)
 Kevin M. Supple, M.D., F.A.A.O.S. Orthopaedic Surgery Specializing in Arthroscopic and Reconstructive Surgery of the Shoulder [phone] [address] - [place], [place] [address] - Fax [phone]   POST-OP TOTAL SHOULDER REPLACEMENT INSTRUCTIONS  1. Follow up in the office for your first post-op appointment 10-14 days from the date of your surgery. If you do not already have a scheduled appointment, our office will contact you to schedule.  2. The bandage over your incision is waterproof. You may begin showering with this dressing on. You may leave this dressing on until first follow up appointment within 2 weeks. We prefer you leave this dressing in place until follow up however after 5-7 days if you are having itching or skin irritation and would like to remove it you may do so. Go slow and tug at the borders gently to break the bond the dressing has with the skin. At this point if there is no drainage it is okay to go without a bandage or you may cover it with a light guaze and tape. You can also expect significant bruising around your shoulder that will drift down your arm and into your chest wall. This is very normal and should resolve over several days.   3. Wear your sling/immobilizer at all times except to perform the exercises below or to occasionally let your arm dangle by your side to stretch your elbow. You also need to sleep in your sling immobilizer until instructed otherwise. It is ok to remove your sling if you are sitting in a controlled environment and allow your arm to rest in a position of comfort by your side or on your lap with pillows to give your neck and skin a break from the sling. You may remove it to allow arm to dangle by side to shower. If you are up walking around and when you go to sleep at night you need to wear it.  4. Range of motion to your elbow, wrist, and hand are encouraged 3-5 times daily. Exercise to your hand and fingers helps to reduce  swelling you may experience.   5. Prescriptions for a pain medication and a muscle relaxant are provided for you. It is recommended that if you are experiencing pain that you pain medication alone is not controlling, add the muscle relaxant along with the pain medication which can give additional pain relief. The first 1-2 days is generally the most severe of your pain and then should gradually decrease. As your pain lessens it is recommended that you decrease your use of the pain medications to an "as needed basis'" only and to always comply with the recommended dosages of the pain medications.  6. Pain medications can produce constipation along with their use. If you experience this, the use of an over the counter stool softener or laxative daily is recommended.   7. For additional questions or concerns, please do not hesitate to call the office. If after hours there is an answering service to forward your concerns to the physician on call.  8.Pain control following an exparel block  To help control your post-operative pain you received a nerve block  performed with Exparel which is a long acting anesthetic (numbing agent) which can provide pain relief and sensations of numbness (and relief of pain) in the operative shoulder and arm for up to 3 days. Sometimes it provides mixed relief, meaning you may still have numbness in certain areas of the arm but can still be able to   move  parts of that arm, hand, and fingers. We recommend that your prescribed pain medications  be used as needed. We do not feel it is necessary to "pre medicate" and "stay ahead" of pain.  Taking narcotic pain medications when you are not having any pain can lead to unnecessary and potentially dangerous side effects.    9. Use the ice machine as much as possible in the first 5-7 days from surgery, then you can wean its use to as needed. The ice typically needs to be replaced every 6 hours, instead of ice you can actually freeze  water bottles to put in the cooler and then fill water around them to avoid having to purchase ice. You can have spare water bottles freezing to allow you to rotate them once they have melted. Try to have a thin shirt or light cloth or towel under the ice wrap to protect your skin.   FOR ADDITIONAL INFO ON ICE MACHINE AND INSTRUCTIONS GO TO THE WEBSITE AT  https://www.[employer].com/products/donjoy/donjoy-iceman-[email or web address]  10.  We recommend that you avoid any dental work or cleaning in the first 3 months following your joint replacement. This is to help minimize the possibility of infection from the bacteria in your mouth that enters your bloodstream during dental work. We also recommend that you take an antibiotic prior to your dental work for the first year after your shoulder replacement to further help reduce that risk. Please simply contact our office for antibiotics to be sent to your pharmacy prior to dental work.  11. Dental Antibiotics:  In most cases prophylactic antibiotics for Dental procdeures after total joint surgery are not necessary.  Exceptions are as follows:  1. History of prior total joint infection  2. Severely immunocompromised (Organ Transplant, cancer chemotherapy, Rheumatoid biologic meds such as Humera)  3. Poorly controlled diabetes (A1C &gt; 8.0, blood glucose over 200)  If you have one of these conditions, contact your surgeon for an antibiotic prescription, prior to your dental procedure.   POST-OP EXERCISES  Pendulum Exercises  Perform pendulum exercises while standing and bending at the waist. Support your uninvolved arm on a table or chair and allow your operated arm to hang freely. Make sure to do these exercises passively - not using you shoulder muscles. These exercises can be performed once your nerve block effects have worn off.  Repeat 20 times. Do 3 sessions per day.     

## 2020-03-24 NOTE — Transfer of Care (Signed)
Immediate Anesthesia Transfer of Care Note  Patient: Tara [name]  Procedure(s) Performed: RIGHT REVERSE SHOULDER ARTHROPLASTY (Right Shoulder)  Patient Location: PACU  Anesthesia Type:General  Level of Consciousness: drowsy and patient cooperative  Airway & Oxygen Therapy: Patient Spontanous Breathing and Patient connected to nasal cannula oxygen  Post-op Assessment: Report given to RN and Post -op Vital signs reviewed and stable  Post vital signs: Reviewed and stable  Last Vitals:  Vitals Value Taken Time  BP 128/78 03/24/20 1215  Temp    Pulse 85 03/24/20 1218  Resp 27 03/24/20 1218  SpO2 100 % 03/24/20 1218  Vitals shown include unvalidated device data.  Last Pain:  Vitals:   03/24/20 0840  PainSc: 0-No pain      Patients Stated Pain Goal: 6 (16/07/37 1062)  Complications: No complications documented.

## 2020-03-25 ENCOUNTER — Other Ambulatory Visit (HOSPITAL_COMMUNITY): Payer: Self-pay | Admitting: Orthopedic Surgery

## 2020-03-25 ENCOUNTER — Encounter (HOSPITAL_COMMUNITY): Payer: Self-pay | Admitting: Orthopedic Surgery

## 2020-03-25 MED FILL — PROMETHAZINE 25 MG TABLET: 25 | 5 days supply | Qty: 20 | Fill #0

## 2020-03-25 MED FILL — GABAPENTIN 300 MG CAPSULE: 300 | 22 days supply | Qty: 40 | Fill #0

## 2020-03-25 MED FILL — HYDROmorphone HCL 2 MG TABS: 2 | 4 days supply | Qty: 15 | Fill #0

## 2020-04-04 ENCOUNTER — Encounter: Payer: Self-pay | Admitting: Internal Medicine

## 2020-04-05 ENCOUNTER — Other Ambulatory Visit: Payer: Self-pay | Admitting: Internal Medicine

## 2020-04-05 DIAGNOSIS — E7800 Pure hypercholesterolemia, unspecified: Secondary | ICD-10-CM

## 2020-04-05 DIAGNOSIS — D62 Acute posthemorrhagic anemia: Secondary | ICD-10-CM

## 2020-04-06 ENCOUNTER — Encounter: Payer: Self-pay | Admitting: Family Medicine

## 2020-04-06 DIAGNOSIS — Z96611 Presence of right artificial shoulder joint: Secondary | ICD-10-CM | POA: Diagnosis not present

## 2020-04-06 DIAGNOSIS — Z471 Aftercare following joint replacement surgery: Secondary | ICD-10-CM | POA: Diagnosis not present

## 2020-04-07 ENCOUNTER — Ambulatory Visit: Payer: 59 | Admitting: Physical Therapy

## 2020-04-11 ENCOUNTER — Encounter: Payer: Self-pay | Admitting: Internal Medicine

## 2020-04-21 ENCOUNTER — Encounter: Payer: Self-pay | Admitting: Internal Medicine

## 2020-04-25 ENCOUNTER — Other Ambulatory Visit: Payer: 59

## 2020-04-25 ENCOUNTER — Other Ambulatory Visit: Payer: Self-pay

## 2020-04-25 ENCOUNTER — Ambulatory Visit (INDEPENDENT_AMBULATORY_CARE_PROVIDER_SITE_OTHER): Payer: 59 | Admitting: Podiatry

## 2020-04-25 DIAGNOSIS — E7800 Pure hypercholesterolemia, unspecified: Secondary | ICD-10-CM | POA: Diagnosis not present

## 2020-04-25 DIAGNOSIS — D62 Acute posthemorrhagic anemia: Secondary | ICD-10-CM

## 2020-04-25 LAB — CBC WITH DIFFERENTIAL/PLATELET
Absolute Monocytes: 416 cells/uL (ref 200–950)
Basophils Absolute: 28 cells/uL (ref 0–200)
Basophils Relative: 0.7 %
Eosinophils Absolute: 120 cells/uL (ref 15–500)
Eosinophils Relative: 3 %
HCT: 41.8 % (ref 35.0–45.0)
Hemoglobin: 14.1 g/dL (ref 11.7–15.5)
Lymphs Abs: 1004 cells/uL (ref 850–3900)
MCH: 33.3 pg — ABNORMAL HIGH (ref 27.0–33.0)
MCHC: 33.7 g/dL (ref 32.0–36.0)
MCV: 98.8 fL (ref 80.0–100.0)
MPV: 10.4 fL (ref 7.5–12.5)
Monocytes Relative: 10.4 %
Neutro Abs: 2432 cells/uL (ref 1500–7800)
Neutrophils Relative %: 60.8 %
Platelets: 229 10*3/uL (ref 140–400)
RBC: 4.23 10*6/uL (ref 3.80–5.10)
RDW: 11.7 % (ref 11.0–15.0)
Total Lymphocyte: 25.1 %
WBC: 4 10*3/uL (ref 3.8–10.8)

## 2020-04-25 LAB — LIPID PANEL
Cholesterol: 164 mg/dL (ref <200)
HDL: 62 mg/dL (ref >=50)
LDL Cholesterol (Calc): 88 mg/dL (calc)
Non-HDL Cholesterol (Calc): 102 mg/dL (calc) (ref <130)
Total CHOL/HDL Ratio: 2.6 (calc) (ref <5.0)
Triglycerides: 54 mg/dL (ref <150)

## 2020-04-25 NOTE — Progress Notes (Signed)
Cholesterol has improved since last time Blood counts are normal.

## 2020-04-26 NOTE — Progress Notes (Signed)
Unfortunately patient had to leave before being seen.  I was running behind schedule.  She was brought to the front desk to reschedule in a day gift card was given for her inconvenience.

## 2020-04-28 ENCOUNTER — Ambulatory Visit (INDEPENDENT_AMBULATORY_CARE_PROVIDER_SITE_OTHER): Payer: 59 | Admitting: Internal Medicine

## 2020-04-28 ENCOUNTER — Encounter: Payer: Self-pay | Admitting: Internal Medicine

## 2020-04-28 ENCOUNTER — Other Ambulatory Visit: Payer: Self-pay

## 2020-04-28 VITALS — BP 104/60 | HR 64 | Temp 97.5°F | Ht 70.0 in | Wt 143.2 lb

## 2020-04-28 DIAGNOSIS — Z96611 Presence of right artificial shoulder joint: Secondary | ICD-10-CM | POA: Diagnosis not present

## 2020-04-28 DIAGNOSIS — R945 Abnormal results of liver function studies: Secondary | ICD-10-CM | POA: Diagnosis not present

## 2020-04-28 DIAGNOSIS — M8580 Other specified disorders of bone density and structure, unspecified site: Secondary | ICD-10-CM

## 2020-04-28 DIAGNOSIS — Z0000 Encounter for general adult medical examination without abnormal findings: Secondary | ICD-10-CM

## 2020-04-28 DIAGNOSIS — E7800 Pure hypercholesterolemia, unspecified: Secondary | ICD-10-CM | POA: Diagnosis not present

## 2020-04-28 DIAGNOSIS — R7989 Other specified abnormal findings of blood chemistry: Secondary | ICD-10-CM

## 2020-04-28 DIAGNOSIS — M8949 Other hypertrophic osteoarthropathy, multiple sites: Secondary | ICD-10-CM | POA: Diagnosis not present

## 2020-04-28 DIAGNOSIS — M159 Polyosteoarthritis, unspecified: Secondary | ICD-10-CM

## 2020-04-28 NOTE — Progress Notes (Signed)
Provider:  Rexene Edison. Mariea Clonts, D.O., C.M.D. Location:   [facility]   Place of Service:   clinic  Previous PCP: [name], Nelda Bucks, NP Patient Care Team: [name], Nelda Bucks, NP as PCP - General (Family Medicine) Kennith Center, RD as Dietitian (Family Medicine) Katy Apo, MD as Consulting Physician (Ophthalmology) Adornetto, Lucianne Lei, DMD (Dentistry) Kennith Center, RD as Dietitian [facility])  Extended Emergency Contact Information Primary Emergency Contact: [facility] Address: [place], [place] [address] Johnnette Litter of [place] Phone: [phone] Work Phone: [phone] Mobile Phone: [phone] Relation: Spouse  Goals of Care: Advanced Directive information Advanced Directives 04/28/2020  Does Patient Have a Medical Advance Directive? Yes  Type of Paramedic of [place];Living will  Does patient want to make changes to medical advance directive? No - Patient declined  Copy of [place] in Chart? Yes - validated most recent copy scanned in chart (See row information)  Pre-existing out of facility DNR order (yellow form or pink MOST form) -      Chief Complaint  Patient presents with  . Annual Exam    Yearly physical and discuss labs     HPI: Patient is a 60 y.o. female seen today for an annual physical exam and to review her lab results.  Is 4 weeks out from reverse prosthetic total arthroplasty of her right arm.  She can't start therapy yet.  It's been so very painful.  Did not tolerate the pain meds--made her sick.  She worries about what will happen years to come and the left is shot, too, but the thought of dealing with it is awful.  Cholesterol is better. Blood counts were normal.   Liver tests were normal in Jan.    Getting new toenail fungus and is going to follow back up with Dr. Jacqualyn Posey (had another appt).  I'd referred her to Dr. Tamala Julian and he noted L3-S1 had some herniated  discs with L4 n root pinched and he recommended cortisone injections.  She declined that.  Cannot do that therapy either until Dr. Onnie Graham releases her. Pain kicks in if she sits more than 5 mins for the left leg.  Started osteostrong in August up until her shoulder surgery.  Had been faithful up to that time.  We hope to get her a new bone density at 1 yr.  She wanted to wait on fosamax until after that.    Still not sleeping well.  Using natural relaxant that also helps.   Tried valerian root, melatonin, magnesium all separate, now doing combo and sleeps about 2 hrs.  This is no different than it's been for years. Even has tried nidra yoga. Does not want medication for fear she'll have to stay on it.  Ongoing chronic pain.  Stopped tylenol b/c of liver tests and avoids antiinflammatories b/c she'd need them long-term.  The formula 33 helps that, too.  Used ice machine initially, now regular ice packs.  Not yet to heat.  Does essential oil rubs.  Helps immediately.  Started back fish oil and tart cherry to see if anything would help.    Past Medical History:  Diagnosis Date  . Anemia   . Arthritis   . Hyperlipidemia   . Insomnia   . Mitral valve prolapse   . PONV (postoperative nausea and vomiting)    Past Surgical History:  Procedure Laterality Date  . AUGMENTATION MAMMAPLASTY  Bilateral    Dec 2019  . BREAST BIOPSY Right    axillary node benign  . [place]   right  . SHOULDER ARTHROSCOPY WITH LABRAL REPAIR Right 08/28/2012   Procedure: RIGHT SHOULDER ARTHROSCOPY WITH LABRAL DEBRIDEMENT, ASPIRATION AND DECOMPRESSION OF PARALABRAL CYST;  Surgeon: Marin Shutter, MD;  Location: [facility];  Service: Orthopedics;  Laterality: Right;  . TOTAL SHOULDER ARTHROPLASTY Right 03/24/2020   Procedure: RIGHT REVERSE SHOULDER ARTHROPLASTY;  Surgeon: Justice Britain, MD;  Location: [facility];  Service: Orthopedics;  Laterality: Right;  143min    reports that she has never smoked. She has never used  smokeless tobacco. She reports that she does not drink alcohol and does not use drugs.  Functional Status Survey:    Family History  Problem Relation Age of Onset  . Stroke Mother   . Sudden death Neg Hx   . Hypertension Neg Hx   . Hyperlipidemia Neg Hx   . Heart attack Neg Hx   . Diabetes Neg Hx     Health Maintenance  Topic Date Due  . PAP SMEAR-Modifier  02/08/2019  . COVID-19 Vaccine (4 - Booster for Pfizer series) 06/10/2020  . MAMMOGRAM  03/10/2021  . DEXA SCAN  08/10/2021  . COLONOSCOPY (Pts 45-22yrs Insurance coverage will need to be confirmed)  02/19/2022  . TETANUS/TDAP  02/20/2025  . INFLUENZA VACCINE  Completed  . Hepatitis C Screening  Completed  . HIV Screening  Completed  . HPV VACCINES  Aged Out    Allergies  Allergen Reactions  . Codeine Nausea And Vomiting  . Hydrocodone Nausea And Vomiting  . Triple Antibiotic Pain Relief [Neomy-Bacit-Polymyx-Pramoxine] Other (See Comments)    Burn  . Triple Antibiotic W/Hydrocortisone  [Bacitra-Neomycin-Polymyxin-Hc]     Outpatient Encounter Medications as of 04/28/2020  Medication Sig  . CALCIUM CITRATE PO Take 400 mg by mouth 3 (three) times daily. Number varies each day based on calcium ingested in food  . cetirizine (ZYRTEC) 10 MG tablet Take 10 mg by mouth daily as needed for allergies or rhinitis.  . Cholecalciferol (VITAMIN D3) 50 MCG (2000 UT) TABS Take 1 tablet by mouth daily.  . Fluocin-Hydroquinone-Tretinoin (TRI-LUMA EX) Apply 1 application topically at bedtime.  . fluticasone (FLONASE) 50 MCG/ACT nasal spray Place 1 spray into both nostrils 3 (three) times daily as needed for rhinitis or allergies.  . Multiple Vitamin (MULTIVITAMIN WITH MINERALS) TABS Take 1 tablet by mouth daily.  . Omega-3 Fatty Acids (FISH OIL) 1000 MG CAPS Take 1,000 mg by mouth 2 (two) times daily. Vit d3 2000 IU  . OVER THE COUNTER MEDICATION Take 2 tablets by mouth at bedtime. Formula 303  supplement for pain and rest  . OVER THE  COUNTER MEDICATION Take 600 mg by mouth 2 (two) times daily. Red yeast rice  . Tart Cherry (TART CHERRY ULTRA) 1200 MG CAPS daily.  Marland Kitchen tretinoin (RETIN-A) 0.05 % cream Apply 1 application topically at bedtime.  . [DISCONTINUED] celecoxib (CELEBREX) 200 MG capsule Take 200 mg by mouth daily.  . [DISCONTINUED] cyclobenzaprine (FLEXERIL) 10 MG tablet Take 5-10 mg by mouth every 8 (eight) hours as needed.  . [DISCONTINUED] ondansetron (ZOFRAN) 4 MG tablet Take 4 mg by mouth every 6 (six) hours as needed.  . [DISCONTINUED] traMADol (ULTRAM) 50 MG tablet Take 50 mg by mouth every 4 (four) hours as needed.   No facility-administered encounter medications on file as of 04/28/2020.    Review of Systems  Constitutional: Negative for  chills, fever and malaise/fatigue.  HENT: Negative for congestion, hearing loss and sore throat.   Eyes: Negative for blurred vision.  Respiratory: Negative for cough and shortness of breath.   Cardiovascular: Negative for chest pain, palpitations and leg swelling.  Gastrointestinal: Negative for abdominal pain and constipation.  Genitourinary: Negative for dysuria.  Musculoskeletal: Positive for joint pain. Negative for falls.       Left hip pain  Skin: Negative for rash.  Neurological: Negative for dizziness, loss of consciousness and weakness.  Psychiatric/Behavioral: Negative for depression and memory loss. The patient is not nervous/anxious and does not have insomnia.     Vitals:   04/28/20 1337  BP: 104/60  Pulse: 64  Temp: (!) 97.5 F (36.4 C)  TempSrc: Temporal  SpO2: 99%  Weight: 143 lb 3.2 oz (65 kg)  Height: 5\' 10"  (1.778 m)   Body mass index is 20.55 kg/m. Physical Exam Vitals reviewed.  Constitutional:      General: She is not in acute distress.    Appearance: Normal appearance. She is not ill-appearing or toxic-appearing.  HENT:     Head: Normocephalic and atraumatic.     Right Ear: Tympanic membrane, ear canal and external ear normal.      Left Ear: Tympanic membrane, ear canal and external ear normal.     Nose: Nose normal.     Mouth/Throat:     Pharynx: Oropharynx is clear. No oropharyngeal exudate or posterior oropharyngeal erythema.  Eyes:     Extraocular Movements: Extraocular movements intact.     Conjunctiva/sclera: Conjunctivae normal.     Pupils: Pupils are equal, round, and reactive to light.  Cardiovascular:     Rate and Rhythm: Normal rate and regular rhythm.     Pulses: Normal pulses.     Heart sounds: Normal heart sounds.     Comments: Midsystolic click Pulmonary:     Effort: Pulmonary effort is normal.     Breath sounds: Normal breath sounds. No wheezing, rhonchi or rales.  Chest:     Chest wall: No mass.  Breasts:     Right: Normal. No swelling, bleeding, inverted nipple, mass, nipple discharge, skin change, tenderness, axillary adenopathy or supraclavicular adenopathy.     Left: Normal. No swelling, bleeding, inverted nipple, mass, nipple discharge, skin change, tenderness, axillary adenopathy or supraclavicular adenopathy.      Comments: Implants present Abdominal:     General: Abdomen is flat. Bowel sounds are normal. There is no distension.     Tenderness: There is no abdominal tenderness. There is no guarding or rebound.  Musculoskeletal:     Right lower leg: No edema.     Left lower leg: No edema.     Comments: Right shoulder without ROM postop, prosthesis notable in upper arm and shoulder, still tender in axilla and anterior arm  Lymphadenopathy:     Cervical: No cervical adenopathy.     Upper Body:     Right upper body: No supraclavicular, axillary or pectoral adenopathy.     Left upper body: No supraclavicular, axillary or pectoral adenopathy.  Skin:    General: Skin is warm and dry.  Neurological:     General: No focal deficit present.     Mental Status: She is alert and oriented to person, place, and time.     Cranial Nerves: No cranial nerve deficit.     Sensory: No sensory deficit.      Motor: No weakness.     Coordination: Coordination normal.  Gait: Gait normal.     Deep Tendon Reflexes: Reflexes normal.  Psychiatric:        Mood and Affect: Mood normal.     Labs reviewed: Basic Metabolic Panel: Recent Labs    02/26/20 1011 03/21/20 0844 03/24/20 0814  NA 133* 136 140  K 4.1 5.1 4.1  CL 99 100 104  CO2 32 27 27  GLUCOSE 86 91 82  BUN 18 17 16   CREATININE 0.69 0.48 0.67  CALCIUM 9.6  9.5 9.6 9.5   Liver Function Tests: Recent Labs    12/02/19 0808 02/26/20 1011 03/24/20 0814  AST 37* 34 41  ALT 26 20 29   ALKPHOS  --  64 71  BILITOT 0.6 0.5 0.3  PROT 6.6 7.2 7.0  ALBUMIN  --  4.6 4.5   No results for input(s): LIPASE, AMYLASE in the last 8760 hours. No results for input(s): AMMONIA in the last 8760 hours. CBC: Recent Labs    02/26/20 1011 03/21/20 0844 04/25/20 0808  WBC 5.3 4.8 4.0  NEUTROABS 3.2  --  2,432  HGB 14.5 15.0 14.1  HCT 42.3 44.2 41.8  MCV 97.7 99.3 98.8  PLT 208.0 206 229   Cardiac Enzymes: No results for input(s): CKTOTAL, CKMB, CKMBINDEX, TROPONINI in the last 8760 hours. BNP: Invalid input(s): POCBNP No results found for: HGBA1C Lab Results  Component Value Date   TSH 1.69 04/22/2018    Assessment/Plan 1. Annual physical exam -performed today -pap was last year--every 5 yr plan -could not get mammo due to shoulder surgery, but breast exam benign today  2. Osteopenia, unspecified location - ongoing, for f/u bone density at one year instead of 2 ideally b/c she's done osteostrong and could not take fosamax during this time with shoulder surgery -taking her calcium and D3 and has worked hard on weightbearing exercise up to her surgery -need reassessment in summer to see if she has had significant decline warranting medications -would not want to wait another year while density declining especially in view of her body habitus making her high risk also for osteoporosis - BASIC METABOLIC PANEL WITH GFR;  Future  3. Pure hypercholesterolemia -cont red yeast rice - Lipid panel; Future  4. Abnormal LFTs -normal this last check but has restarted her fish oil due to joint pains and to assist with D3 absorption/bone healing - Hepatic function panel; Future  5. S/P reverse total shoulder arthroplasty, right -keep f/u with Dr. Onnie Graham as planned -can't do therapy yet -cont her formula 33, fish oil, tart cherry for pain as could not tolerate tramadol, celebrex long even with zofran  6. Primary osteoarthritis involving multiple joints -cont joint pain regimen as in #5  Labs/tests ordered:   Lab Orders     Lipid panel     BASIC METABOLIC PANEL WITH GFR     Hepatic function panel  F/u in 6 mos with Dinah for med mgt with fasting labs before  Natasha Paulson L. Jane Broughton, D.O. [facility] [address], [place] [address] Cell Phone (Mon-Fri 8am-5pm):  [phone] On Call:  [phone] & follow prompts after 5pm & weekends Office Phone:  [phone] Office Fax:  [phone]

## 2020-04-29 ENCOUNTER — Encounter: Payer: Self-pay | Admitting: Internal Medicine

## 2020-04-29 ENCOUNTER — Other Ambulatory Visit: Payer: Self-pay | Admitting: Internal Medicine

## 2020-04-29 DIAGNOSIS — M8580 Other specified disorders of bone density and structure, unspecified site: Secondary | ICD-10-CM

## 2020-04-29 DIAGNOSIS — Z1231 Encounter for screening mammogram for malignant neoplasm of breast: Secondary | ICD-10-CM

## 2020-05-05 ENCOUNTER — Ambulatory Visit: Payer: 59 | Admitting: Family Medicine

## 2020-05-06 ENCOUNTER — Ambulatory Visit: Payer: 59 | Attending: Orthopedic Surgery | Admitting: Physical Therapy

## 2020-05-06 ENCOUNTER — Other Ambulatory Visit: Payer: Self-pay

## 2020-05-06 ENCOUNTER — Encounter: Payer: Self-pay | Admitting: Physical Therapy

## 2020-05-06 DIAGNOSIS — M25611 Stiffness of right shoulder, not elsewhere classified: Secondary | ICD-10-CM | POA: Insufficient documentation

## 2020-05-06 DIAGNOSIS — M6281 Muscle weakness (generalized): Secondary | ICD-10-CM | POA: Diagnosis not present

## 2020-05-06 DIAGNOSIS — M5416 Radiculopathy, lumbar region: Secondary | ICD-10-CM | POA: Insufficient documentation

## 2020-05-06 DIAGNOSIS — M25511 Pain in right shoulder: Secondary | ICD-10-CM | POA: Diagnosis not present

## 2020-05-06 NOTE — Therapy (Signed)
[facility] [address] [place], [place], [address] Phone: [phone]   Fax:  [phone]  Physical Therapy Evaluation  Patient Details  Name: Tara [name] MRN: [id number] Date of Birth: 1960-09-18 Referring Provider (PT): Dr. Onnie Graham   Encounter Date: 05/06/2020   PT End of Session - 05/06/20 1133    Visit Number 1    Date for PT Re-Evaluation 07/29/20    PT Start Time 1224    PT Stop Time 1115    PT Time Calculation (min) 60 min    Activity Tolerance Patient limited by pain           Past Medical History:  Diagnosis Date  . Anemia   . Arthritis   . Hyperlipidemia   . Insomnia   . Mitral valve prolapse   . PONV (postoperative nausea and vomiting)     Past Surgical History:  Procedure Laterality Date  . AUGMENTATION MAMMAPLASTY Bilateral    Dec 2019  . BREAST BIOPSY Right    axillary node benign  . [place]   right  . SHOULDER ARTHROSCOPY WITH LABRAL REPAIR Right 08/28/2012   Procedure: RIGHT SHOULDER ARTHROSCOPY WITH LABRAL DEBRIDEMENT, ASPIRATION AND DECOMPRESSION OF PARALABRAL CYST;  Surgeon: Marin Shutter, MD;  Location: [facility];  Service: Orthopedics;  Laterality: Right;  . TOTAL SHOULDER ARTHROPLASTY Right 03/24/2020   Procedure: RIGHT REVERSE SHOULDER ARTHROPLASTY;  Surgeon: Justice Britain, MD;  [facility];  Service: Orthopedics;  Laterality: Right;  153min    There were no vitals filed for this visit.    Subjective Assessment - 05/06/20 1022    Subjective right TSR reverse 03/24/20;  doing passive ROM 3x /day;  out of sling at least 2-3 weeks;  Also with Left buttock, posterior thigh and gluteal pain with sitting 5 minutes, sitting on toilet and previously when running 3-4 miles (followed by Dr. Tamala Julian)    Pertinent History right reverse total shoulder replacement;  03/24/20;  right knee OA    Limitations Lifting;House hold activities    How long can you sit comfortably? 5 min  limited by left buttock and post thigh pain    Diagnostic tests MRI L3 to S1 with nerve impingement left    Patient Stated Goals do my hair; lift in my cabinets; out of pain (dr thinks I'll be fully functional)    Currently in Pain? Yes    Pain Score 4     Pain Location Shoulder    Pain Orientation Right    Pain Type Surgical pain    Pain Onset More than a month ago    Pain Frequency Constant    Aggravating Factors  any right UE use    Pain Relieving Factors cold packs at home    Multiple Pain Sites Yes    Pain Score 9   in the car   Pain Location Hip    Pain Orientation Left    Pain Type Chronic pain    Pain Onset More than a month ago    Pain Frequency Constant    Aggravating Factors  sitting; riding in the car              [facility] PT Assessment - 05/06/20 0001      Assessment   Medical Diagnosis right shoulder reverse replacement; left buttock pain    Referring Provider (PT) Dr. Onnie Graham    Onset Date/Surgical Date 03/24/20    Next MD Visit mid April  Precautions   Precautions Shoulder      Restrictions   Weight Bearing Restrictions No      Balance Screen   Has the patient fallen in the past 6 months No    Has the patient had a decrease in activity level because of a fear of falling?  No    Is the patient reluctant to leave their home because of a fear of falling?  No      Home Ecologist residence    Living Arrangements Spouse/significant other      Prior Function   Level of Independence Independent with basic ADLs    Vocation Full time employment    Vocation Requirements [employer]    Leisure running; work out every day      Observation/Other Assessments   Observations incision appears well-healed    Focus on Therapeutic Outcomes (FOTO)  45%      AROM   Overall AROM Comments left shoulder limited as well but functional for reaching head    Right Shoulder Flexion 82 Degrees    Right Shoulder ABduction 65 Degrees     Right Shoulder Internal Rotation 40 Degrees    Right Shoulder External Rotation 0 Degrees      PROM   Right Shoulder Flexion 100 Degrees    Right Shoulder ABduction 90 Degrees    Right Shoulder External Rotation 0 Degrees      Strength   Overall Strength Comments not tested secondary to surgical precautions                      Objective measurements completed on examination: See above findings.       [facility] PT Treatment/Exercise - 05/06/20 0001      Shoulder Exercises: Pulleys   Flexion 1 minute    ABduction 1 minute      Shoulder Exercises: ROM/Strengthening   Other ROM/Strengthening Exercises dowel external rotation 5x      Shoulder Exercises: Isometric Strengthening   Flexion 5X5"    External Rotation 5X5"      Vasopneumatic   Number Minutes Vasopneumatic  10 minutes    Vasopnuematic Location  Shoulder    Vasopneumatic Pressure Low    Vasopneumatic Temperature  coldest   supine with bolster behind knees                 PT Education - 05/06/20 1133    Education Details pulleys flexion and abduction; isometrics at wall abduction and external rotation    Person(s) Educated Patient    Methods Explanation;Demonstration;Handout    Comprehension Returned demonstration;Verbalized understanding            PT Short Term Goals - 05/06/20 1158      PT SHORT TERM GOAL #1   Title The patient will have 120 degrees flexion ROM needed for reaching eye level for grooming/dressing    Time 6    Period Weeks    Status New    Target Date 06/03/20      PT SHORT TERM GOAL #2   Title The patient will report a 40% improvement in right shoulder pain with basic home and work ADLS and sleep    Time 6    Period Weeks    Status New      PT SHORT TERM GOAL #3   Title Abduction/scaption elevation to 100 degrees needed for driving    Time 6    Period Weeks  Status New      PT SHORT TERM GOAL #4   Title FOTO functional score will improve to 55%     Time 6    Period Weeks    Status New             PT Long Term Goals - 05/06/20 1202      PT LONG TERM GOAL #1   Title The patient will be independent in safe self progression of HEP    Time 12    Period Weeks    Status New    Target Date 07/29/20      PT LONG TERM GOAL #2   Title The patient will have 150 degrees of forward elevation needed for reaching into kitchen  cabinets    Time 12    Period Weeks    Status New      PT LONG TERM GOAL #3   Title The patient will have gross UE strength to lift dishes in the kitchen    Time 12    Period Weeks    Status New      PT LONG TERM GOAL #4   Title The patient will report a 60% improvement in right shoulder pain with most home and work activities    Time 12    Period Weeks    Status New      PT LONG TERM GOAL #5   Title FOTO score improved to 65%    Time 12    Period Weeks    Status New                  Plan - 05/06/20 1136    Clinical Impression Statement The patient underwent right reverse total joint arthroplasty on 03/24/20 for severe degenerative changes.  She has also been dealing with left buttock and posterior thigh pain with sitting < 5 minutes (she is unable to sit in the lobby) and difficulty sitting on the toilet and riding in the car as well.  She discussed with Dr. Onnie Graham the need for treatment to this area including DN in addition to her shoulder rehab.  Will include order request for eval and treatment of this issue as well on initial certification.  Her right shoulder pain has been severe at times but she does have some limited use at work and was able to drive for the first time today.  Active flexion 82 degrees, passive flexion 100 degrees on pulleys. Active abduction 65 degrees, passive 90 degrees.  0 degrees of active and passive external rotation.  Her FOTO score is 45% indicating "fair" use of UE with ADLS.  She will benefit from PT to address these deficits needed to return to cooking, exercising  for health, work duties and driving.    Personal Factors and Comorbidities Comorbidity 1    Comorbidities multi region pain: degenerative changes left shoulder and right knee as well    Examination-Activity Limitations Bathing;Reach Overhead;Carry;Sleep;Dressing;Hygiene/Grooming;Toileting;Lift    Examination-Participation Restrictions Meal Prep;Cleaning;Occupation;Driving;Laundry    Stability/Clinical Decision Making Stable/Uncomplicated    Clinical Decision Making Low    Rehab Potential Good    PT Frequency 2x / week    PT Duration 12 weeks    PT Treatment/Interventions ADLs/Self Care Home Management;Aquatic Therapy;Cryotherapy;Electrical Stimulation;Moist Heat;Neuromuscular re-education;Therapeutic exercise;Therapeutic activities;Patient/family education;Manual techniques;Dry needling;Taping    PT Next Visit Plan if order signed will assess and treat lumbar/hip;  follow reverse total shoulder per MD protocol Passive ROM, pulleys, isometrics, assisted ROM; vasocompression  PT Home Exercise Plan [email or web address]    Consulted and Agree with Plan of Care Patient           Patient will benefit from skilled therapeutic intervention in order to improve the following deficits and impairments:  Decreased range of motion,Impaired UE functional use,Pain,Impaired perceived functional ability  Visit Diagnosis: Acute pain of right shoulder - Plan: PT plan of care cert/re-cert  Stiffness of right shoulder, not elsewhere classified - Plan: PT plan of care cert/re-cert  Muscle weakness (generalized) - Plan: PT plan of care cert/re-cert     Problem List Patient Active Problem List   Diagnosis Date Noted  . Low back pain 02/26/2020  . Nonallopathic lesion of sacral region 02/26/2020  . Hot flashes 02/23/2019  . Other insomnia 02/23/2019  . Secondary osteoarthritis of left shoulder due to rotator cuff arthropathy 02/23/2019  . Primary osteoarthritis involving multiple joints 02/23/2019  . Cystic acne  vulgaris 02/23/2019  . Osteopenia 02/23/2019  . Bunion of right foot 02/23/2019  . Varicose 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 02/23/2019  . Multiple atypical nevi 02/23/2019  . Polyarthralgia 08/25/2014  . Arthritis of right shoulder region 07/28/2014  . Impingement syndrome of right shoulder 06/20/2010  . Subacromial bursitis 06/20/2010  . Rotator cuff tendonitis 06/20/2010  . DYSPNEA 03/09/2010  . Mitral valve prolapse 03/03/2009  . HYPERTENSION, BENIGN 01/12/2009  . PURE HYPERCHOLESTEROLEMIA 01/03/2009  . MIGRAINE HEADACHE 01/03/2009  . CHEST PAIN UNSPECIFIED 01/03/2009  . ELEVATED BLOOD PRESSURE 01/03/2009   Ruben Im, PT 05/06/20 12:11 PM Phone: [phone] Fax: [phone] Tara [name] 05/06/2020, 12:08 PM  [facility] [address], [place], [place], [address] Phone: [phone]   Fax:  [phone]  Name: Tara [name] MRN: [id number] Date of Birth: 28-Nov-1960

## 2020-05-06 NOTE — Patient Instructions (Signed)
Access Code: [email or web address] URL: [email or web address] Date: 05/06/2020 Prepared by: Ruben Im  Exercises Supine Shoulder External Rotation AAROM with Dowel - 1 x daily - 7 x weekly - 1 sets - 10 reps Seated Shoulder Flexion AAROM with Pulley Behind - 1 x daily - 7 x weekly - 3 sets - 10 reps Seated Shoulder Abduction AAROM with Pulley Behind - 1 x daily - 7 x weekly - 3 sets - 10 reps Standing Isometric Shoulder Abduction with Doorway - 1 x daily - 7 x weekly - 1 sets - 5 reps - 5 hold Isometric Shoulder External Rotation at Wall - 1 x daily - 7 x weekly - 1 sets - 5 reps - 5 hold

## 2020-05-12 ENCOUNTER — Ambulatory Visit: Payer: 59 | Admitting: Physical Therapy

## 2020-05-12 ENCOUNTER — Other Ambulatory Visit: Payer: Self-pay

## 2020-05-12 DIAGNOSIS — M25611 Stiffness of right shoulder, not elsewhere classified: Secondary | ICD-10-CM | POA: Diagnosis not present

## 2020-05-12 DIAGNOSIS — M25511 Pain in right shoulder: Secondary | ICD-10-CM

## 2020-05-12 DIAGNOSIS — M6281 Muscle weakness (generalized): Secondary | ICD-10-CM

## 2020-05-12 DIAGNOSIS — M5416 Radiculopathy, lumbar region: Secondary | ICD-10-CM | POA: Diagnosis not present

## 2020-05-12 NOTE — Therapy (Signed)
[facility] [address] [place], [place], [address] Phone: [phone]   Fax:  [phone]  Physical Therapy Treatment  Patient Details  Name: Tara [name] MRN: [id number] Date of Birth: [birth year] Referring Provider (PT): Dr. Onnie Graham   Encounter Date: 05/12/2020   PT End of Session - 05/12/20 1722    Visit Number 2    Date for PT Re-Evaluation 07/29/20    Authorization Type UMR    PT Start Time 1530    PT Stop Time 1617    PT Time Calculation (min) 47 min    Activity Tolerance Patient tolerated treatment well           Past Medical History:  Diagnosis Date  . Anemia   . Arthritis   . Hyperlipidemia   . Insomnia   . Mitral valve prolapse   . PONV (postoperative nausea and vomiting)     Past Surgical History:  Procedure Laterality Date  . AUGMENTATION MAMMAPLASTY Bilateral    Dec 2019  . BREAST BIOPSY Right    axillary node benign  . [place]   right  . SHOULDER ARTHROSCOPY WITH LABRAL REPAIR Right 08/28/2012   Procedure: RIGHT SHOULDER ARTHROSCOPY WITH LABRAL DEBRIDEMENT, ASPIRATION AND DECOMPRESSION OF PARALABRAL CYST;  Surgeon: Marin Shutter, MD;  Location: [facility];  Service: Orthopedics;  Laterality: Right;  . TOTAL SHOULDER ARTHROPLASTY Right 03/24/2020   Procedure: RIGHT REVERSE SHOULDER ARTHROPLASTY;  Surgeon: Justice Britain, MD;  Location: [facility];  Service: Orthopedics;  Laterality: Right;  164min    There were no vitals filed for this visit.   Subjective Assessment - 05/12/20 1535    Subjective Isometrics are good.  I'll spasm some. I got a pulley for home.    Pertinent History right reverse total shoulder replacement;  03/24/20;  right knee OA    Currently in Pain? Yes    Pain Score 3     Pain Location Shoulder    Pain Type Surgical pain    Pain Score 6    Pain Orientation Left    Pain Type Chronic pain    Pain Onset More than a month ago              [facility] PT  Assessment - 05/12/20 0001      AROM   Overall AROM Comments lumbar extension standing 15 degrees; able to lie prone without increase in symptoms      Palpation   Palpation comment minimal tender points in piriformis      Special Tests   Other special tests + neural signs in radicular pattern L5                         [facility] Adult PT Treatment/Exercise - 05/12/20 0001      Lumbar Exercises: Standing   Other Standing Lumbar Exercises standing lumbar extension at the counter top 10x      Shoulder Exercises: ROM/Strengthening   [place] on 1st, 2nd and 3rd steps 10x each    Other ROM/Strengthening Exercises UE slides on stair railing 10x      Electrical Stimulation   Electrical Stimulation Location bil lumbar multifidi    Electrical Stimulation Action pre mod 1.5 with Dn    Electrical Stimulation Parameters 8 min with DN    Electrical Stimulation Goals Pain      Manual Therapy   Soft tissue mobilization to bil lumbar paraspinals and left piriformis  Passive ROM right shoulder flexion 100 degrees, abduction 85, external rotation to 10 degrees 25-30x each            Trigger Point Dry Needling - 05/12/20 0001    Consent Given? Yes    Education Handout Provided Previously provided    Muscles Treated Back/Hip Piriformis;Lumbar multifidi    Electrical Stimulation Performed with Dry Needling Yes    E-stim with Dry Needling Details bil lumbar multifidi    Piriformis Response Palpable increased muscle length    Lumbar multifidi Response Twitch response elicited;Palpable increased muscle length                  PT Short Term Goals - 05/12/20 1730      PT SHORT TERM GOAL #1   Title The patient will have 120 degrees flexion ROM needed for reaching eye level for grooming/dressing    Time 6    Period Weeks    Status On-going    Target Date 06/03/20      PT SHORT TERM GOAL #2   Title The patient will report a 40% improvement in right shoulder pain with  basic home and work ADLS and sleep    Time 6    Period Weeks    Status On-going      PT SHORT TERM GOAL #3   Title Abduction/scaption elevation to 100 degrees needed for driving    Time 6    Period Weeks    Status On-going      PT SHORT TERM GOAL #4   Title FOTO functional score will improve to 55%    Time 6    Period Weeks    Status On-going      PT SHORT TERM GOAL #5   Title The patient will report a 25% improvement in left LE symptoms while sitting for driving and work duties    Time 6    Period Weeks    Status New             PT Long Term Goals - 05/12/20 1732      PT LONG TERM GOAL #1   Title The patient will be independent in safe self progression of HEP    Time 12    Period Weeks    Status On-going      PT LONG TERM GOAL #2   Title The patient will have 150 degrees of forward elevation needed for reaching into kitchen  cabinets    Time 12    Period Weeks    Status On-going      PT LONG TERM GOAL #3   Title The patient will have gross UE strength to lift dishes in the kitchen    Time 12    Period Weeks    Status On-going      PT LONG TERM GOAL #4   Title The patient will report a 60% improvement in right shoulder pain with most home and work activities    Time 12    Period Weeks    Status On-going      PT LONG TERM GOAL #5   Title FOTO score improved to 65%    Time 12    Period Weeks    Status On-going      Additional Long Term Goals   Additional Long Term Goals Yes      PT LONG TERM GOAL #6   Title The patient will report a 50% improvement in LE symptoms with driving and sitting  for work    Time 12    Period Weeks    Status New                 Plan - 05/12/20 1722    Clinical Impression Statement Improved shoulder mobility with less shoulder guarding overall.  She is highly compliant with her HEP.  External rotation continues to be most limited.  She has significant left posterior thigh and leg pain following a L5 radicular  pattern.  She is unable to peform many of the exercises that would centralize these symptoms secondary to surgical precautions.  She may benefit from lumbar traction.  Her last bone density T score of 1.5 indicates mild osteoporossis.  Will add to plan of care and send updated request to Dr. Onnie Graham.  Therapist monitoring response with all treatment interventions.  She is on track with phase 2 goals and exercises for shoulder per protocol    Personal Factors and Comorbidities Comorbidity 1    Comorbidities multi region pain: degenerative changes left shoulder and right knee as well    Examination-Activity Limitations Bathing;Reach Overhead;Carry;Sleep;Dressing;Hygiene/Grooming;Toileting;Lift    Examination-Participation Restrictions Meal Prep;Cleaning;Occupation;Driving;Laundry    Rehab Potential Good    PT Frequency 2x / week    PT Duration 12 weeks    PT Treatment/Interventions ADLs/Self Care Home Management;Aquatic Therapy;Cryotherapy;Electrical Stimulation;Moist Heat;Neuromuscular re-education;Therapeutic exercise;Therapeutic activities;Patient/family education;Manual techniques;Dry needling;Taping;Traction    PT Next Visit Plan send plan of care to Dr. Onnie Graham to add traction;  shoulder progression per protocol on 3/31;  assess response to DN with ES lumbar region           Patient will benefit from skilled therapeutic intervention in order to improve the following deficits and impairments:  Decreased range of motion,Impaired UE functional use,Pain,Impaired perceived functional ability  Visit Diagnosis: Acute pain of right shoulder - Plan: PT plan of care cert/re-cert  Stiffness of right shoulder, not elsewhere classified - Plan: PT plan of care cert/re-cert  Muscle weakness (generalized) - Plan: PT plan of care cert/re-cert  Radiculopathy, lumbar region - Plan: PT plan of care cert/re-cert     Problem List Patient Active Problem List   Diagnosis Date Noted  . Low back pain  02/26/2020  . Nonallopathic lesion of sacral region 02/26/2020  . Hot flashes 02/23/2019  . Other insomnia 02/23/2019  . Secondary osteoarthritis of left shoulder due to rotator cuff arthropathy 02/23/2019  . Primary osteoarthritis involving multiple joints 02/23/2019  . Cystic acne vulgaris 02/23/2019  . Osteopenia 02/23/2019  . Bunion of right foot 02/23/2019  . Varicose 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 02/23/2019  . Multiple atypical nevi 02/23/2019  . Polyarthralgia 08/25/2014  . Arthritis of right shoulder region 07/28/2014  . Impingement syndrome of right shoulder 06/20/2010  . Subacromial bursitis 06/20/2010  . Rotator cuff tendonitis 06/20/2010  . DYSPNEA 03/09/2010  . Mitral valve prolapse 03/03/2009  . HYPERTENSION, BENIGN 01/12/2009  . PURE HYPERCHOLESTEROLEMIA 01/03/2009  . MIGRAINE HEADACHE 01/03/2009  . CHEST PAIN UNSPECIFIED 01/03/2009  . ELEVATED BLOOD PRESSURE 01/03/2009   Ruben Im, PT 05/12/20 5:41 PM Phone: [phone] Fax: [phone] Alvera Singh 05/12/2020, 5:41 PM  [facility] [address] [place], [place], [address] Phone: [phone]   Fax:  [phone]  Name: Tara [name] MRN: [id number] Date of Birth: 1960-09-14

## 2020-05-17 ENCOUNTER — Ambulatory Visit: Payer: 59 | Admitting: Physical Therapy

## 2020-05-23 ENCOUNTER — Encounter: Payer: Self-pay | Admitting: Podiatry

## 2020-05-24 ENCOUNTER — Ambulatory Visit: Payer: 59 | Attending: Orthopedic Surgery | Admitting: Physical Therapy

## 2020-05-24 ENCOUNTER — Other Ambulatory Visit: Payer: Self-pay

## 2020-05-24 DIAGNOSIS — M25511 Pain in right shoulder: Secondary | ICD-10-CM | POA: Insufficient documentation

## 2020-05-24 DIAGNOSIS — M25611 Stiffness of right shoulder, not elsewhere classified: Secondary | ICD-10-CM | POA: Insufficient documentation

## 2020-05-24 DIAGNOSIS — M6281 Muscle weakness (generalized): Secondary | ICD-10-CM | POA: Insufficient documentation

## 2020-05-24 DIAGNOSIS — M5416 Radiculopathy, lumbar region: Secondary | ICD-10-CM | POA: Diagnosis not present

## 2020-05-24 NOTE — Patient Instructions (Signed)
Access Code: [email or web address] URL: [email or web address] Date: 05/24/2020 Prepared by: Ruben Im  Exercises Supine Shoulder External Rotation AAROM with Dowel - 1 x daily - 7 x weekly - 1 sets - 10 reps Seated Shoulder Flexion AAROM with Pulley Behind - 1 x daily - 7 x weekly - 3 sets - 10 reps Seated Shoulder Abduction AAROM with Pulley Behind - 1 x daily - 7 x weekly - 3 sets - 10 reps Standing Isometric Shoulder Abduction with Doorway - 1 x daily - 7 x weekly - 1 sets - 5 reps - 5 hold Isometric Shoulder External Rotation at Wall - 1 x daily - 7 x weekly - 1 sets - 5 reps - 5 hold Standing Bilateral Low Shoulder Row with Anchored Resistance - 1 x daily - 7 x weekly - 2 sets - 10 reps Single Arm Shoulder Extension with Anchored Resistance - 1 x daily - 7 x weekly - 2 sets - 10 reps Standing Shoulder Single Arm Flexion with Anchored Resistance - 1 x daily - 7 x weekly - 2 sets - 10 reps Supine Shoulder Alphabet - 1 x daily - 7 x weekly - 2 sets - 10 reps

## 2020-05-24 NOTE — Therapy (Signed)
[facility] [address], [place], [place], [address] Phone: [phone]   Fax:  [phone]  Physical Therapy Treatment  Patient Details  Name: Tara [name] MRN: [id number] Date of Birth: May 25, 1960 Referring Provider (PT): Dr. Onnie Graham   Encounter Date: 05/24/2020   PT End of Session - 05/24/20 2100    Visit Number 3    Date for PT Re-Evaluation 07/29/20    Authorization Type UMR    PT Start Time 1530    PT Stop Time 1625    PT Time Calculation (min) 55 min    Activity Tolerance Patient tolerated treatment well           Past Medical History:  Diagnosis Date  . Anemia   . Arthritis   . Hyperlipidemia   . Insomnia   . Mitral valve prolapse   . PONV (postoperative nausea and vomiting)     Past Surgical History:  Procedure Laterality Date  . AUGMENTATION MAMMAPLASTY Bilateral    Dec 2019  . BREAST BIOPSY Right    axillary node benign  . [place]   right  . SHOULDER ARTHROSCOPY WITH LABRAL REPAIR Right 08/28/2012   Procedure: RIGHT SHOULDER ARTHROSCOPY WITH LABRAL DEBRIDEMENT, ASPIRATION AND DECOMPRESSION OF PARALABRAL CYST;  Surgeon: Marin Shutter, MD;  Location: [facility];  Service: Orthopedics;  Laterality: Right;  . TOTAL SHOULDER ARTHROPLASTY Right 03/24/2020   Procedure: RIGHT REVERSE SHOULDER ARTHROPLASTY;  Surgeon: Justice Britain, MD;  Location[facility];  Service: Orthopedics;  Laterality: Right;  112min    There were no vitals filed for this visit.   Subjective Assessment - 05/24/20 1530    Subjective I couldn't tell if DN helped or not.  I'm worried about traveling to [place] end of the month.  That shoulder implant really hurts.    Pertinent History right reverse total shoulder replacement;  03/24/20;  right knee OA    Patient Stated Goals do my hair; lift in my cabinets; out of pain (dr thinks I'll be fully functional)    Currently in Pain? Yes    Pain Location Shoulder    Pain Orientation  Right    Pain Score 5    Pain Location Leg    Pain Orientation Left                             [facility] Adult PT Treatment/Exercise - 05/24/20 0001      Shoulder Exercises: Supine   Flexion AROM;Right;10 reps    Flexion Limitations 1 set flat; 1 set elevated on wedge    Other Supine Exercises 12/6:00 10x; 3:00/9:00 10x    Other Supine Exercises ABCs at 90 degrees      Shoulder Exercises: Sidelying   External Rotation AROM;20 reps    Flexion 20 reps      Shoulder Exercises: Standing   Flexion 20 reps    Theraband Level (Shoulder Flexion) Level 1 (Yellow)    Flexion Limitations press forward and down 20x    Extension 20 reps;Theraband    Row Right;20 reps    Theraband Level (Shoulder Row) Level 1 (Yellow)      Shoulder Exercises: Therapy Ball   Flexion Both;10 reps    Other Therapy Ball Exercises ball circles 10x each way      Traction   Type of Traction Lumbar    Min (lbs) 45    Max (lbs) 70  Hold Time 45    Rest Time 15    Time 12                  PT Education - 05/24/20 2100    Education Details yellow band rows, extensions, press forward; supine ABCs    Person(s) Educated Patient    Methods Explanation;Demonstration;Handout    Comprehension Returned demonstration;Verbalized understanding            PT Short Term Goals - 05/12/20 1730      PT SHORT TERM GOAL #1   Title The patient will have 120 degrees flexion ROM needed for reaching eye level for grooming/dressing    Time 6    Period Weeks    Status On-going    Target Date 06/03/20      PT SHORT TERM GOAL #2   Title The patient will report a 40% improvement in right shoulder pain with basic home and work ADLS and sleep    Time 6    Period Weeks    Status On-going      PT SHORT TERM GOAL #3   Title Abduction/scaption elevation to 100 degrees needed for driving    Time 6    Period Weeks    Status On-going      PT SHORT TERM GOAL #4   Title FOTO functional score will  improve to 55%    Time 6    Period Weeks    Status On-going      PT SHORT TERM GOAL #5   Title The patient will report a 25% improvement in left LE symptoms while sitting for driving and work duties    Time 6    Period Weeks    Status New             PT Long Term Goals - 05/12/20 1732      PT LONG TERM GOAL #1   Title The patient will be independent in safe self progression of HEP    Time 12    Period Weeks    Status On-going      PT LONG TERM GOAL #2   Title The patient will have 150 degrees of forward elevation needed for reaching into kitchen  cabinets    Time 12    Period Weeks    Status On-going      PT LONG TERM GOAL #3   Title The patient will have gross UE strength to lift dishes in the kitchen    Time 12    Period Weeks    Status On-going      PT LONG TERM GOAL #4   Title The patient will report a 60% improvement in right shoulder pain with most home and work activities    Time 12    Period Weeks    Status On-going      PT LONG TERM GOAL #5   Title FOTO score improved to 65%    Time 12    Period Weeks    Status On-going      Additional Long Term Goals   Additional Long Term Goals Yes      PT LONG TERM GOAL #6   Title The patient will report a 50% improvement in LE symptoms with driving and sitting for work    Time [place] - 05/24/20 1559  Clinical Impression Statement The patient is able to progress per total shoulder protocol to more challenging ROM ex's including sustained elevation in supine and standing positions.  Initiated light (yellow band) ex in lower planes of motion.  Dr. Onnie Graham signed order for LBP treatment including the addition of lumbar traction.  No immediate changes to peripheral symptoms.    Comorbidities multi region pain: degenerative changes left shoulder and right knee as well    Examination-Activity Limitations Bathing;Reach  Overhead;Carry;Sleep;Dressing;Hygiene/Grooming;Toileting;Lift    Rehab Potential Good    PT Frequency 2x / week    PT Duration 12 weeks    PT Treatment/Interventions ADLs/Self Care Home Management;Aquatic Therapy;Cryotherapy;Electrical Stimulation;Moist Heat;Neuromuscular re-education;Therapeutic exercise;Therapeutic activities;Patient/family education;Manual techniques;Dry needling;Taping;Traction    PT Next Visit Plan assess response to lumbar traction;  shoulder active ROM and lower level strengthening; recheck ROM measurements    PT Home Exercise Plan [email or web address]           Patient will benefit from skilled therapeutic intervention in order to improve the following deficits and impairments:  Decreased range of motion,Impaired UE functional use,Pain,Impaired perceived functional ability  Visit Diagnosis: Acute pain of right shoulder  Stiffness of right shoulder, not elsewhere classified  Muscle weakness (generalized)  Radiculopathy, lumbar region     Problem List Patient Active Problem List   Diagnosis Date Noted  . Low back pain 02/26/2020  . Nonallopathic lesion of sacral region 02/26/2020  . Hot flashes 02/23/2019  . Other insomnia 02/23/2019  . Secondary osteoarthritis of left shoulder due to rotator cuff arthropathy 02/23/2019  . Primary osteoarthritis involving multiple joints 02/23/2019  . Cystic acne vulgaris 02/23/2019  . Osteopenia 02/23/2019  . Bunion of right foot 02/23/2019  . Varicose 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 02/23/2019  . Multiple atypical nevi 02/23/2019  . Polyarthralgia 08/25/2014  . Arthritis of right shoulder region 07/28/2014  . Impingement syndrome of right shoulder 06/20/2010  . Subacromial bursitis 06/20/2010  . Rotator cuff tendonitis 06/20/2010  . DYSPNEA 03/09/2010  . Mitral valve prolapse 03/03/2009  . HYPERTENSION, BENIGN 01/12/2009  . PURE HYPERCHOLESTEROLEMIA 01/03/2009  . MIGRAINE HEADACHE 01/03/2009  . CHEST  PAIN UNSPECIFIED 01/03/2009  . ELEVATED BLOOD PRESSURE 01/03/2009   Ruben Im, PT 05/24/20 9:11 PM Phone: [phone] Fax: [phone] Alvera Singh 05/24/2020, 9:11 PM  [facility] [address] [place], [place], [address] Phone: [phone]   Fax:  [phone]  Name: Tara [name] MRN: [id number] Date of Birth: 11-15-1960

## 2020-05-26 ENCOUNTER — Ambulatory Visit: Payer: 59 | Admitting: Physical Therapy

## 2020-05-26 ENCOUNTER — Other Ambulatory Visit: Payer: Self-pay

## 2020-05-26 DIAGNOSIS — M25611 Stiffness of right shoulder, not elsewhere classified: Secondary | ICD-10-CM | POA: Diagnosis not present

## 2020-05-26 DIAGNOSIS — M25511 Pain in right shoulder: Secondary | ICD-10-CM

## 2020-05-26 DIAGNOSIS — M5416 Radiculopathy, lumbar region: Secondary | ICD-10-CM

## 2020-05-26 DIAGNOSIS — M6281 Muscle weakness (generalized): Secondary | ICD-10-CM

## 2020-05-26 NOTE — Patient Instructions (Signed)
Access Code: [email or web address] URL: [email or web address] Date: 05/26/2020 Prepared by: Ruben Im  Exercises Supine Shoulder External Rotation AAROM with Dowel - 1 x daily - 7 x weekly - 1 sets - 10 reps Seated Shoulder Flexion AAROM with Pulley Behind - 1 x daily - 7 x weekly - 3 sets - 10 reps Seated Shoulder Abduction AAROM with Pulley Behind - 1 x daily - 7 x weekly - 3 sets - 10 reps Standing Isometric Shoulder Abduction with Doorway - 1 x daily - 7 x weekly - 1 sets - 5 reps - 5 hold Isometric Shoulder External Rotation at Wall - 1 x daily - 7 x weekly - 1 sets - 5 reps - 5 hold Standing Bilateral Low Shoulder Row with Anchored Resistance - 1 x daily - 7 x weekly - 2 sets - 10 reps Single Arm Shoulder Extension with Anchored Resistance - 1 x daily - 7 x weekly - 2 sets - 10 reps Standing Shoulder Single Arm Flexion with Anchored Resistance - 1 x daily - 7 x weekly - 2 sets - 10 reps Supine Shoulder Alphabet - 1 x daily - 7 x weekly - 2 sets - 10 reps Standing Shoulder Internal Rotation AAROM with Pulley - 1 x daily - 7 x weekly - 3 sets - 10 reps Standing Shoulder Internal Rotation with Anchored Resistance - 1 x daily - 7 x weekly - 2 sets - 10 reps Shoulder External Rotation with Anchored Resistance - 1 x daily - 7 x weekly - 2 sets - 10 reps

## 2020-05-26 NOTE — Therapy (Signed)
[facility] [address] [place], [place], [address] Phone: [phone]   Fax:  [phone]  Physical Therapy Treatment  Patient Details  Name: Tara [name] MRN: [id number] Date of Birth: [birth year] Referring Provider (PT): Dr. Onnie Graham   Encounter Date: 05/26/2020   PT End of Session - 05/26/20 1740    Visit Number 4    Date for PT Re-Evaluation 07/29/20    Authorization Type UMR    PT Start Time 1106    PT Stop Time 1158    PT Time Calculation (min) 52 min    Activity Tolerance Patient tolerated treatment well           Past Medical History:  Diagnosis Date  . Anemia   . Arthritis   . Hyperlipidemia   . Insomnia   . Mitral valve prolapse   . PONV (postoperative nausea and vomiting)     Past Surgical History:  Procedure Laterality Date  . AUGMENTATION MAMMAPLASTY Bilateral    Dec 2019  . BREAST BIOPSY Right    axillary node benign  . [place]   right  . SHOULDER ARTHROSCOPY WITH LABRAL REPAIR Right 08/28/2012   Procedure: RIGHT SHOULDER ARTHROSCOPY WITH LABRAL DEBRIDEMENT, ASPIRATION AND DECOMPRESSION OF PARALABRAL CYST;  Surgeon: Marin Shutter, MD;  Location: [facility];  Service: Orthopedics;  Laterality: Right;  . TOTAL SHOULDER ARTHROPLASTY Right 03/24/2020   Procedure: RIGHT REVERSE SHOULDER ARTHROPLASTY;  Surgeon: Justice Britain, MD;  Location: [facility];  Service: Orthopedics;  Laterality: Right;  167min    There were no vitals filed for this visit.   Subjective Assessment - 05/26/20 1109    Subjective I had a hard time driving to [place].  I had to stop twice.  Left posterior/lateral lower leg pain.    Currently in Pain? Yes    Pain Location Shoulder    Pain Orientation Right    Pain Score 8    Pain Location Leg    Pain Orientation Left              [facility] PT Assessment - 05/26/20 0001      AROM   Right Shoulder Flexion 80 Degrees    Right Shoulder ABduction 72 Degrees       PROM   Right Shoulder Flexion 120 Degrees    Right Shoulder ABduction 110 Degrees    Right Shoulder External Rotation 35 Degrees                         [facility] Adult PT Treatment/Exercise - 05/26/20 0001      Shoulder Exercises: Sidelying   External Rotation Right;10 reps    Flexion 10 reps      Shoulder Exercises: Standing   External Rotation Strengthening;Right;15 reps    Theraband Level (Shoulder External Rotation) Level 1 (Yellow)    Internal Rotation Strengthening;Right;15 reps    Theraband Level (Shoulder Internal Rotation) Level 1 (Yellow)      Shoulder Exercises: Pulleys   Other Pulley Exercises standing internal rotation 1 min      Shoulder Exercises: ROM/Strengthening   Ranger on wall L11 15x      Traction   Type of Traction Lumbar    Min (lbs) 45    Max (lbs) 85    Hold Time 45    Rest Time 15    Time 12      Manual Therapy   Passive ROM shoulder flexion 120,  external rotation at 45 and 90 degrees 2x 10 each                  PT Education - 05/26/20 1740    Education Details pulley internal rotation standing; yellow band internal and external rotation    Person(s) Educated Patient    Methods Explanation;Demonstration   added to [place] Verbalized understanding;Returned demonstration            PT Short Term Goals - 05/12/20 1730      PT SHORT TERM GOAL #1   Title The patient will have 120 degrees flexion ROM needed for reaching eye level for grooming/dressing    Time 6    Period Weeks    Status On-going    Target Date 06/03/20      PT SHORT TERM GOAL #2   Title The patient will report a 40% improvement in right shoulder pain with basic home and work ADLS and sleep    Time 6    Period Weeks    Status On-going      PT SHORT TERM GOAL #3   Title Abduction/scaption elevation to 100 degrees needed for driving    Time 6    Period Weeks    Status On-going      PT SHORT TERM GOAL #4   Title FOTO  functional score will improve to 55%    Time 6    Period Weeks    Status On-going      PT SHORT TERM GOAL #5   Title The patient will report a 25% improvement in left LE symptoms while sitting for driving and work duties    Time 6    Period Weeks    Status New             PT Long Term Goals - 05/12/20 1732      PT LONG TERM GOAL #1   Title The patient will be independent in safe self progression of HEP    Time 12    Period Weeks    Status On-going      PT LONG TERM GOAL #2   Title The patient will have 150 degrees of forward elevation needed for reaching into kitchen  cabinets    Time 12    Period Weeks    Status On-going      PT LONG TERM GOAL #3   Title The patient will have gross UE strength to lift dishes in the kitchen    Time 12    Period Weeks    Status On-going      PT LONG TERM GOAL #4   Title The patient will report a 60% improvement in right shoulder pain with most home and work activities    Time 12    Period Weeks    Status On-going      PT LONG TERM GOAL #5   Title FOTO score improved to 65%    Time 12    Period Weeks    Status On-going      Additional Long Term Goals   Additional Long Term Goals Yes      PT LONG TERM GOAL #6   Title The patient will report a 50% improvement in LE symptoms with driving and sitting for work    Time 12    Period Weeks    Status New                 Plan -  05/26/20 1741    Clinical Impression Statement Improving passive, active-assisted ROM and gravity assisted ROM in all planes of motion.  Most motions still painful but we discussed easing up on motions that increased pain level > 5/10.  Continuing to encourage non aggressive ROM.  No immediate change in lower leg symptoms after last session.  Increased pull amount today.  Symptoms continue to be highly irritated with sitting especially driving.    Comorbidities multi region pain: degenerative changes left shoulder and right knee as well    Rehab  Potential Good    PT Frequency 2x / week    PT Duration 12 weeks    PT Treatment/Interventions ADLs/Self Care Home Management;Aquatic Therapy;Cryotherapy;Electrical Stimulation;Moist Heat;Neuromuscular re-education;Therapeutic exercise;Therapeutic activities;Patient/family education;Manual techniques;Dry needling;Taping;Traction    PT Next Visit Plan lumbar traction;  shoulder active ROM and low level strengthening;  check STGS next week; give handout of [employer] new ex's next visit    PT [place]           Patient will benefit from skilled therapeutic intervention in order to improve the following deficits and impairments:  Decreased range of motion,Impaired UE functional use,Pain,Impaired perceived functional ability  Visit Diagnosis: Acute pain of right shoulder  Stiffness of right shoulder, not elsewhere classified  Muscle weakness (generalized)  Radiculopathy, lumbar region     Problem List Patient Active Problem List   Diagnosis Date Noted  . Low back pain 02/26/2020  . Nonallopathic lesion of sacral region 02/26/2020  . Hot flashes 02/23/2019  . Other insomnia 02/23/2019  . Secondary osteoarthritis of left shoulder due to rotator cuff arthropathy 02/23/2019  . Primary osteoarthritis involving multiple joints 02/23/2019  . Cystic acne vulgaris 02/23/2019  . Osteopenia 02/23/2019  . Bunion of right foot 02/23/2019  . Varicose 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 02/23/2019  . Multiple atypical nevi 02/23/2019  . Polyarthralgia 08/25/2014  . Arthritis of right shoulder region 07/28/2014  . Impingement syndrome of right shoulder 06/20/2010  . Subacromial bursitis 06/20/2010  . Rotator cuff tendonitis 06/20/2010  . DYSPNEA 03/09/2010  . Mitral valve prolapse 03/03/2009  . HYPERTENSION, BENIGN 01/12/2009  . PURE HYPERCHOLESTEROLEMIA 01/03/2009  . MIGRAINE HEADACHE 01/03/2009  . CHEST PAIN UNSPECIFIED 01/03/2009  . ELEVATED BLOOD  PRESSURE 01/03/2009   Ruben Im, PT 05/26/20 5:48 PM Phone: [phone] Fax: [phone] Alvera Singh 05/26/2020, 5:48 PM  [facility] [address] [place], [place], [address] Phone: [phone]   Fax:  [phone]  Name: Tara [name] MRN: [id number] Date of Birth: 1960-08-02

## 2020-05-31 ENCOUNTER — Other Ambulatory Visit: Payer: Self-pay

## 2020-05-31 ENCOUNTER — Ambulatory Visit: Payer: 59 | Admitting: Physical Therapy

## 2020-05-31 DIAGNOSIS — M25511 Pain in right shoulder: Secondary | ICD-10-CM | POA: Diagnosis not present

## 2020-05-31 DIAGNOSIS — M6281 Muscle weakness (generalized): Secondary | ICD-10-CM | POA: Diagnosis not present

## 2020-05-31 DIAGNOSIS — M5416 Radiculopathy, lumbar region: Secondary | ICD-10-CM | POA: Diagnosis not present

## 2020-05-31 DIAGNOSIS — M25611 Stiffness of right shoulder, not elsewhere classified: Secondary | ICD-10-CM | POA: Diagnosis not present

## 2020-05-31 NOTE — Therapy (Signed)
[facility] [address] [place], [place], [address] Phone: [phone]   Fax:  [phone]  Physical Therapy Treatment  Patient Details  Name: Tara [name] MRN: [id number] Date of Birth: 07/22/1960 Referring Provider (PT): Dr. Onnie Graham   Encounter Date: 05/31/2020   PT End of Session - 05/31/20 1712    Visit Number 5    Date for PT Re-Evaluation 07/29/20    Authorization Type UMR    PT Start Time 1615    PT Stop Time 1700    PT Time Calculation (min) 45 min    Activity Tolerance Patient tolerated treatment well           Past Medical History:  Diagnosis Date  . Anemia   . Arthritis   . Hyperlipidemia   . Insomnia   . Mitral valve prolapse   . PONV (postoperative nausea and vomiting)     Past Surgical History:  Procedure Laterality Date  . AUGMENTATION MAMMAPLASTY Bilateral    Dec 2019  . BREAST BIOPSY Right    axillary node benign  . [place]   right  . SHOULDER ARTHROSCOPY WITH LABRAL REPAIR Right 08/28/2012   Procedure: RIGHT SHOULDER ARTHROSCOPY WITH LABRAL DEBRIDEMENT, ASPIRATION AND DECOMPRESSION OF PARALABRAL CYST;  Surgeon: Marin Shutter, MD;  Location: [facility];  Service: Orthopedics;  Laterality: Right;  . TOTAL SHOULDER ARTHROPLASTY Right 03/24/2020   Procedure: RIGHT REVERSE SHOULDER ARTHROPLASTY;  Surgeon: Justice Britain, MD;  Location: [facility];  Service: Orthopedics;  Laterality: Right;  1109min    There were no vitals filed for this visit.   Subjective Assessment - 05/31/20 1706    Subjective I want to get to the bottom of this leg pain.  No change in symptoms from mechanical traction last visit;   That's bothering me more than my shoulder.    Pertinent History right reverse total shoulder replacement;  03/24/20;  right knee OA    Diagnostic tests MRI L3 to S1 with nerve impingement left    Patient Stated Goals do my hair; lift in my cabinets; out of pain (dr thinks I'll be fully  functional)    Currently in Pain? Yes    Pain Score 5     Pain Location Buttocks    Pain Orientation Left    Pain Type Acute pain              [facility] PT Assessment - 05/31/20 0001      FABER test   findings Negative      Slump test   Findings Negative      Prone Knee Bend Test   Findings Negative      Straight Leg Raise   Findings Negative                         [facility] Adult PT Treatment/Exercise - 05/31/20 0001      Therapeutic Activites    Therapeutic Activities Other Therapeutic Activities    Other Therapeutic Activities trial of pool noodle for sitting to unload sciatic nerve      Lumbar Exercises: Standing   Other Standing Lumbar Exercises repeated spinal flexion 10x no effect    Other Standing Lumbar Exercises repeated spinal extension 10x no effect      Lumbar Exercises: Supine   Other Supine Lumbar Exercises DKTC with overpressure no effect      Lumbar Exercises: Prone   Other Prone Lumbar Exercises sustained  extension in lying with 2 pillows under chest 5 minutes no effect on symptoms      Manual Therapy   Joint Mobilization lumbar PA L2-L5 grade 3/4 no effect on symptoms; left long leg distraction 1 minute    Soft tissue mobilization no tenderness piriformis or gluteals                    PT Short Term Goals - 05/12/20 1730      PT SHORT TERM GOAL #1   Title The patient will have 120 degrees flexion ROM needed for reaching eye level for grooming/dressing    Time 6    Period Weeks    Status On-going    Target Date 06/03/20      PT SHORT TERM GOAL #2   Title The patient will report a 40% improvement in right shoulder pain with basic home and work ADLS and sleep    Time 6    Period Weeks    Status On-going      PT SHORT TERM GOAL #3   Title Abduction/scaption elevation to 100 degrees needed for driving    Time 6    Period Weeks    Status On-going      PT SHORT TERM GOAL #4   Title FOTO functional score will improve  to 55%    Time 6    Period Weeks    Status On-going      PT SHORT TERM GOAL #5   Title The patient will report a 25% improvement in left LE symptoms while sitting for driving and work duties    Time 6    Period Weeks    Status New             PT Long Term Goals - 05/12/20 1732      PT LONG TERM GOAL #1   Title The patient will be independent in safe self progression of HEP    Time 12    Period Weeks    Status On-going      PT LONG TERM GOAL #2   Title The patient will have 150 degrees of forward elevation needed for reaching into kitchen  cabinets    Time 12    Period Weeks    Status On-going      PT LONG TERM GOAL #3   Title The patient will have gross UE strength to lift dishes in the kitchen    Time 12    Period Weeks    Status On-going      PT LONG TERM GOAL #4   Title The patient will report a 60% improvement in right shoulder pain with most home and work activities    Time 12    Period Weeks    Status On-going      PT LONG TERM GOAL #5   Title FOTO score improved to 65%    Time 12    Period Weeks    Status On-going      Additional Long Term Goals   Additional Long Term Goals Yes      PT LONG TERM GOAL #6   Title The patient will report a 50% improvement in LE symptoms with driving and sitting for work    Time 12    Period Weeks    Status New                 Plan - 05/31/20 1713    Clinical Impression Statement The patient  reports her shoulder is doing fairly well and she is highly compliant with her HEP.  She requests to focus on left LE symptoms aggravated by sitting, and harder to relieve at the end of the day.  Extensive repeated movement of the lumbar spine performed in standing and lying without a change in left buttock and behind the knee pain.  No change as well with hip FABER, long leg hip distraction or with SLR and slump tests.  Trial of nerve decompression with sitting using a pool noodle (donut style).  Will resume shoulder rehab  next visit.  Notable improvement in shoulder functional use with change of positions during movement testing.    Comorbidities multi region pain: degenerative changes left shoulder and right knee as well    Examination-Activity Limitations Bathing;Reach Overhead;Carry;Sleep;Dressing;Hygiene/Grooming;Toileting;Lift    Rehab Potential Good    PT Frequency 2x / week    PT Duration 12 weeks    PT Treatment/Interventions ADLs/Self Care Home Management;Aquatic Therapy;Cryotherapy;Electrical Stimulation;Moist Heat;Neuromuscular re-education;Therapeutic exercise;Therapeutic activities;Patient/family education;Manual techniques;Dry needling;Taping;Traction    PT Next Visit Plan assess response to pool noodle when sitting (circular around sits bones); resume shoulder ROM and strengthening Phase 2 reverse total shoulder; check STGs    PT Home Exercise Plan [email or web address]           Patient will benefit from skilled therapeutic intervention in order to improve the following deficits and impairments:  Decreased range of motion,Impaired UE functional use,Pain,Impaired perceived functional ability  Visit Diagnosis: Radiculopathy, lumbar region     Problem List Patient Active Problem List   Diagnosis Date Noted  . Low back pain 02/26/2020  . Nonallopathic lesion of sacral region 02/26/2020  . Hot flashes 02/23/2019  . Other insomnia 02/23/2019  . Secondary osteoarthritis of left shoulder due to rotator cuff arthropathy 02/23/2019  . Primary osteoarthritis involving multiple joints 02/23/2019  . Cystic acne vulgaris 02/23/2019  . Osteopenia 02/23/2019  . Bunion of right foot 02/23/2019  . Varicose 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 02/23/2019  . Multiple atypical nevi 02/23/2019  . Polyarthralgia 08/25/2014  . Arthritis of right shoulder region 07/28/2014  . Impingement syndrome of right shoulder 06/20/2010  . Subacromial bursitis 06/20/2010  . Rotator cuff tendonitis 06/20/2010  .  DYSPNEA 03/09/2010  . Mitral valve prolapse 03/03/2009  . HYPERTENSION, BENIGN 01/12/2009  . PURE HYPERCHOLESTEROLEMIA 01/03/2009  . MIGRAINE HEADACHE 01/03/2009  . CHEST PAIN UNSPECIFIED 01/03/2009  . ELEVATED BLOOD PRESSURE 01/03/2009   Ruben Im, PT 05/31/20 5:24 PM Phone: [phone] Fax: [phone] Alvera Singh 05/31/2020, 5:24 PM  [facility] [address], [place], [place], [address] Phone: [phone]   Fax:  [phone]  Name: Tara [name] MRN: [id number] Date of Birth: Mar 25, 1960

## 2020-06-02 ENCOUNTER — Ambulatory Visit: Payer: 59 | Admitting: Physical Therapy

## 2020-06-02 ENCOUNTER — Other Ambulatory Visit: Payer: Self-pay

## 2020-06-02 DIAGNOSIS — M5416 Radiculopathy, lumbar region: Secondary | ICD-10-CM | POA: Diagnosis not present

## 2020-06-02 DIAGNOSIS — M25611 Stiffness of right shoulder, not elsewhere classified: Secondary | ICD-10-CM

## 2020-06-02 DIAGNOSIS — M25511 Pain in right shoulder: Secondary | ICD-10-CM | POA: Diagnosis not present

## 2020-06-02 DIAGNOSIS — M6281 Muscle weakness (generalized): Secondary | ICD-10-CM | POA: Diagnosis not present

## 2020-06-02 NOTE — Therapy (Signed)
[facility] [address], [place], [place], [address] Phone: [phone]   Fax:  [phone]  Physical Therapy Treatment  Patient Details  Name: Tara [name] MRN: [id number] Date of Birth: 1960-09-22 Referring Provider (PT): Dr. Onnie Graham   Encounter Date: 06/02/2020   PT End of Session - 06/02/20 1400    Visit Number 6    Date for PT Re-Evaluation 07/29/20    Authorization Type UMR    PT Start Time 1230    PT Stop Time 1318    PT Time Calculation (min) 48 min    Activity Tolerance Patient tolerated treatment well           Past Medical History:  Diagnosis Date  . Anemia   . Arthritis   . Hyperlipidemia   . Insomnia   . Mitral valve prolapse   . PONV (postoperative nausea and vomiting)     Past Surgical History:  Procedure Laterality Date  . AUGMENTATION MAMMAPLASTY Bilateral    Dec 2019  . BREAST BIOPSY Right    axillary node benign  . [place]   right  . SHOULDER ARTHROSCOPY WITH LABRAL REPAIR Right 08/28/2012   Procedure: RIGHT SHOULDER ARTHROSCOPY WITH LABRAL DEBRIDEMENT, ASPIRATION AND DECOMPRESSION OF PARALABRAL CYST;  Surgeon: Marin Shutter, MD;  Location: [place];  Service: Orthopedics;  Laterality: Right;  . TOTAL SHOULDER ARTHROPLASTY Right 03/24/2020   Procedure: RIGHT REVERSE SHOULDER ARTHROPLASTY;  Surgeon: Justice Britain, MD;  Location: [facility];  Service: Orthopedics;  Laterality: Right;  15min    There were no vitals filed for this visit.       [facility] PT Assessment - 06/02/20 0001      AROM   Right Shoulder Flexion 105 Degrees   Active assisted 150 degrees   Right Shoulder ABduction 90 Degrees   Active assisted 100 degrees    Right Shoulder Internal Rotation --   L5                        [facility] Adult PT Treatment/Exercise - 06/02/20 0001      Shoulder Exercises: Standing   Other Standing Exercises right flexion on wall with lift offs at the top 2x 10     Other Standing Exercises ball on wall ABCS below and above 90 degrees 3 sets      Shoulder Exercises: ROM/Strengthening   [place] on wall L18, 21, 24 10x each    Other ROM/Strengthening Exercises internal rotation on countertop and place/holds      Modalities   Modalities Electrical Stimulation      Electrical Stimulation   Electrical Stimulation Location left lat HS and lateral gastroc    Electrical Stimulation Action pre-mod with DN    Electrical Stimulation Parameters with DN 8 min    Electrical Stimulation Goals Pain      Manual Therapy   Soft tissue mobilization right piriformis, lateral HS, lateral gastroc            Trigger Point Dry Needling - 06/02/20 0001    Consent Given? Yes    Muscles Treated Lower Quadrant Gastrocnemius;Vastus lateralis    Electrical Stimulation Performed with Dry Needling Yes    Vastus lateralis Response Palpable increased muscle length;Twitch response elicited    Gastrocnemius Response Twitch response elicited;Palpable increased muscle length    Piriformis Response Palpable increased muscle length  PT Education - 06/02/20 1359    Education Details wall flexion lift offs; wall ABCS; wall flexion towel or ball slides    Person(s) Educated Patient    Methods Explanation;Demonstration;Handout    Comprehension Returned demonstration;Verbalized understanding            PT Short Term Goals - 06/02/20 1408      PT SHORT TERM GOAL #1   Title The patient will have 120 degrees flexion ROM needed for reaching eye level for grooming/dressing    Time 6    Period Weeks    Status On-going      PT SHORT TERM GOAL #2   Title The patient will report a 40% improvement in right shoulder pain with basic home and work ADLS and sleep    Status Achieved      PT SHORT TERM GOAL #3   Title Abduction/scaption elevation to 100 degrees needed for driving    Status Achieved      PT SHORT TERM GOAL #4   Title FOTO functional score will  improve to 55%    Time 6    Period Weeks    Status On-going      PT SHORT TERM GOAL #5   Title The patient will report a 25% improvement in left LE symptoms while sitting for driving and work duties    Time 6    Period Weeks    Status On-going             PT Long Term Goals - 05/12/20 1732      PT LONG TERM GOAL #1   Title The patient will be independent in safe self progression of HEP    Time 12    Period Weeks    Status On-going      PT LONG TERM GOAL #2   Title The patient will have 150 degrees of forward elevation needed for reaching into kitchen  cabinets    Time 12    Period Weeks    Status On-going      PT LONG TERM GOAL #3   Title The patient will have gross UE strength to lift dishes in the kitchen    Time 12    Period Weeks    Status On-going      PT LONG TERM GOAL #4   Title The patient will report a 60% improvement in right shoulder pain with most home and work activities    Time 12    Period Weeks    Status On-going      PT LONG TERM GOAL #5   Title FOTO score improved to 65%    Time 12    Period Weeks    Status On-going      Additional Long Term Goals   Additional Long Term Goals Yes      PT LONG TERM GOAL #6   Title The patient will report a 50% improvement in LE symptoms with driving and sitting for work    Time 12    Period Weeks    Status New                 Plan - 06/02/20 1233    Clinical Impression Statement The patient demonstrates improving shoulder ROM and motor control and demonstrates excellent compliance with her HEP.  Her right lower leg pain continues to be bothersome with sitting primarily but also with standing at times.   Today it is posterior/lateral  knee more than buttock region.  Performed DN to piriformis and DN with ES to lateral HS and lateral gastroc muscles.  Several twitch responses produced which is a good prognostic indicator.  Therapist monitoring response with all treatment interventions.     Comorbidities multi region pain: degenerative changes left shoulder and right knee as well    Examination-Activity Limitations Bathing;Reach Overhead;Carry;Sleep;Dressing;Hygiene/Grooming;Toileting;Lift    Rehab Potential Good    PT Frequency 2x / week    PT Duration 12 weeks    PT Treatment/Interventions ADLs/Self Care Home Management;Aquatic Therapy;Cryotherapy;Electrical Stimulation;Moist Heat;Neuromuscular re-education;Therapeutic exercise;Therapeutic activities;Patient/family education;Manual techniques;Dry needling;Taping;Traction    PT Next Visit Plan assess response to DN/ES to left LE;  right shoulder ROM and strengthening phase 2 reverse total shoulder; recheck ROM; check FOTO and remaining STGS; driving to [facility] 4/28   PT [place]           Patient will benefit from skilled therapeutic intervention in order to improve the following deficits and impairments:  Decreased range of motion,Impaired UE functional use,Pain,Impaired perceived functional ability  Visit Diagnosis: Radiculopathy, lumbar region  Acute pain of right shoulder  Stiffness of right shoulder, not elsewhere classified  Muscle weakness (generalized)     Problem List Patient Active Problem List   Diagnosis Date Noted  . Low back pain 02/26/2020  . Nonallopathic lesion of sacral region 02/26/2020  . Hot flashes 02/23/2019  . Other insomnia 02/23/2019  . Secondary osteoarthritis of left shoulder due to rotator cuff arthropathy 02/23/2019  . Primary osteoarthritis involving multiple joints 02/23/2019  . Cystic acne vulgaris 02/23/2019  . Osteopenia 02/23/2019  . Bunion of right foot 02/23/2019  . Varicose 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 02/23/2019  . Multiple atypical nevi 02/23/2019  . Polyarthralgia 08/25/2014  . Arthritis of right shoulder region 07/28/2014  . Impingement syndrome of right shoulder 06/20/2010  . Subacromial bursitis 06/20/2010  . Rotator  cuff tendonitis 06/20/2010  . DYSPNEA 03/09/2010  . Mitral valve prolapse 03/03/2009  . HYPERTENSION, BENIGN 01/12/2009  . PURE HYPERCHOLESTEROLEMIA 01/03/2009  . MIGRAINE HEADACHE 01/03/2009  . CHEST PAIN UNSPECIFIED 01/03/2009  . ELEVATED BLOOD PRESSURE 01/03/2009   Ruben Im, PT 06/02/20 2:11 PM Phone: [phone] Fax: [phone] Alvera Singh 06/02/2020, 2:10 PM  [facility] [address] [place], [place], [address] Phone: [phone]   Fax:  [phone]  Name: Tara [name] MRN: [id number] Date of Birth: 1961/02/16

## 2020-06-02 NOTE — Patient Instructions (Signed)
Access Code: [email or web address] URL: [email or web address] Date: 06/02/2020 Prepared by: Ruben Im  Exercises Seated Shoulder Flexion AAROM with Pulley Behind - 1 x daily - 7 x weekly - 3 sets - 10 reps Seated Shoulder Abduction AAROM with Pulley Behind - 1 x daily - 7 x weekly - 3 sets - 10 reps Standing Shoulder Internal Rotation AAROM with Pulley - 1 x daily - 7 x weekly - 3 sets - 10 reps Standing Isometric Shoulder Abduction with Doorway - 1 x daily - 7 x weekly - 1 sets - 5 reps - 5 hold Standing Bilateral Low Shoulder Row with Anchored Resistance - 1 x daily - 7 x weekly - 2 sets - 10 reps Single Arm Shoulder Extension with Anchored Resistance - 1 x daily - 7 x weekly - 2 sets - 10 reps Standing Shoulder Single Arm Flexion with Anchored Resistance - 1 x daily - 7 x weekly - 2 sets - 10 reps Standing Shoulder Internal Rotation with Anchored Resistance - 1 x daily - 7 x weekly - 2 sets - 10 reps Shoulder External Rotation with Anchored Resistance - 1 x daily - 7 x weekly - 2 sets - 10 reps Standing Shoulder Alphabet - 1 x daily - 7 x weekly - 3 sets - 10 reps Standing Wall Ball Circles with Mini Swiss Ball - 1 x daily - 7 x weekly - 3 sets - 10 reps Shoulder Flexion Wall Slide with Towel - 1 x daily - 7 x weekly - 2 sets - 10 reps Standing Shoulder Flexion Wall Walk - 1 x daily - 7 x weekly - 2 sets - 10 reps

## 2020-06-07 ENCOUNTER — Ambulatory Visit (INDEPENDENT_AMBULATORY_CARE_PROVIDER_SITE_OTHER): Payer: 59 | Admitting: Podiatry

## 2020-06-07 ENCOUNTER — Ambulatory Visit (INDEPENDENT_AMBULATORY_CARE_PROVIDER_SITE_OTHER): Payer: 59

## 2020-06-07 ENCOUNTER — Other Ambulatory Visit: Payer: Self-pay

## 2020-06-07 DIAGNOSIS — Q828 Other specified congenital malformations of skin: Secondary | ICD-10-CM | POA: Diagnosis not present

## 2020-06-07 DIAGNOSIS — M2041 Other hammer toe(s) (acquired), right foot: Secondary | ICD-10-CM

## 2020-06-07 DIAGNOSIS — B351 Tinea unguium: Secondary | ICD-10-CM | POA: Diagnosis not present

## 2020-06-07 DIAGNOSIS — M79672 Pain in left foot: Secondary | ICD-10-CM

## 2020-06-07 DIAGNOSIS — M21612 Bunion of left foot: Secondary | ICD-10-CM | POA: Diagnosis not present

## 2020-06-07 DIAGNOSIS — M2042 Other hammer toe(s) (acquired), left foot: Secondary | ICD-10-CM

## 2020-06-07 DIAGNOSIS — M21619 Bunion of unspecified foot: Secondary | ICD-10-CM

## 2020-06-07 MED ORDER — EFINACONAZOLE 10 % EX SOLN: 1.0000 [drp] | Freq: Every day | CUTANEOUS | 11 refills | Status: DC

## 2020-06-07 NOTE — Progress Notes (Deleted)
[facility] [place] [place] Phone: [phone] Subjective:    I'm seeing this patient by the request  of:  [name], [name], NP  CC:   [id number]  Tara [name] is a 60 y.o. female coming in with complaint of R shoulder pain. Reverse shoulder arthroplasty 03/24/2020.       Past Medical History:  Diagnosis Date  . Anemia   . Arthritis   . Hyperlipidemia   . Insomnia   . Mitral valve prolapse   . PONV (postoperative nausea and vomiting)    Past Surgical History:  Procedure Laterality Date  . AUGMENTATION MAMMAPLASTY Bilateral    Dec 2019  . BREAST BIOPSY Right    axillary node benign  . [place]   right  . SHOULDER ARTHROSCOPY WITH LABRAL REPAIR Right 08/28/2012   Procedure: RIGHT SHOULDER ARTHROSCOPY WITH LABRAL DEBRIDEMENT, ASPIRATION AND DECOMPRESSION OF PARALABRAL CYST;  Surgeon: Marin Shutter, MD;  Location: [facility];  Service: Orthopedics;  Laterality: Right;  . TOTAL SHOULDER ARTHROPLASTY Right 03/24/2020   Procedure: RIGHT REVERSE SHOULDER ARTHROPLASTY;  Surgeon: Justice Britain, MD;  Location: [facility];  Service: Orthopedics;  Laterality: Right;  146min   Social History   Socioeconomic History  . Marital status: Married    Spouse name: Not on file  . Number of children: Not on file  . Years of education: Not on file  . Highest education level: Not on file  Occupational History  . Not on file  Tobacco Use  . Smoking status: Never Smoker  . Smokeless tobacco: Never Used  Vaping Use  . Vaping Use: Never used  Substance and Sexual Activity  . Alcohol use: No  . Drug use: No  . Sexual activity: Not on file  Other Topics Concern  . Not on file  Social History Narrative   Tobacco use, amount per day now: 0   Past tobacco use, amount per day: 0   How many years did you use tobacco: 0   Alcohol use (drinks per week): 0   Diet: Heart Healthy/DASH Diet/ Lean and Clean type foods.   Do you  drink/eat things with caffeine: 1 glass of Tea in the morning.   Marital status: Married                                 What year were you married? 1987   Do you live in a house, apartment, assisted living, condo, trailer, etc.? House   Is it one or more stories? Yes   How many persons live in your home? 2 ( Husband/ Self )   Do you have pets in your home?( please list) Not currently, Dog Deceased.    Current or past profession: PhD/RN   Do you exercise?    Yes                              Type and how often? 6 Days a week.   Do you have a living will? Yes   Do you have a DNR form?   No                               If not, do you want to discuss one? Yes   Do you have  signed POA/HPOA forms? Yes                       If so, please bring to you appointment   Social Determinants of Health   Financial Resource Strain: Not on file  Food Insecurity: Not on file  Transportation Needs: Not on file  Physical Activity: Not on file  Stress: Not on file  Social Connections: Not on file   Allergies  Allergen Reactions  . Codeine Nausea And Vomiting  . Hydrocodone Nausea And Vomiting  . Triple Antibiotic Pain Relief [Neomy-Bacit-Polymyx-Pramoxine] Other (See Comments)    Burn  . Triple Antibiotic W/Hydrocortisone  [Bacitra-Neomycin-Polymyxin-Hc]    Family History  Problem Relation Age of Onset  . Stroke Mother   . Sudden death Neg Hx   . Hypertension Neg Hx   . Hyperlipidemia Neg Hx   . Heart attack Neg Hx   . Diabetes Neg Hx       Current Outpatient Medications (Respiratory):  .  cetirizine (ZYRTEC) 10 MG tablet, Take 10 mg by mouth daily as needed for allergies or rhinitis. .  fluticasone (FLONASE) 50 MCG/ACT nasal spray, Place 1 spray into both nostrils 3 (three) times daily as needed for rhinitis or allergies. .  promethazine (PHENERGAN) 25 MG tablet, TAKE 1 TABLET BY MOUTH EVERY 6 HOURS AS NEEDED FOR NAUSEA.  Current Outpatient Medications (Analgesics):  .  celecoxib  (CELEBREX) 200 MG capsule, TAKE 1 CAPSULE BY MOUTH ONCE DAILY .  HYDROmorphone (DILAUDID) 2 MG tablet, TAKE 1 TABLET BY MOUTH EVERY 6 HOURS AS NEEDED FOR PAIN. .  traMADol (ULTRAM) 50 MG tablet, TAKE 1 TABLET BY MOUTH EVERY 4 TO 6 HOURS AS NEEDED FOR PAIN   Current Outpatient Medications (Other):  Marland Kitchen  CALCIUM CITRATE PO, Take 400 mg by mouth 3 (three) times daily. Number varies each day based on calcium ingested in food .  Cholecalciferol (VITAMIN D3) 50 MCG (2000 UT) TABS, Take 1 tablet by mouth daily. .  cyclobenzaprine (FLEXERIL) 10 MG tablet, TAKE 1/2 TO 1 TABLET BY MOUTH EVERY 8 HOURS AS NEEDED FOR SPASMS .  Fluocin-Hydroquinone-Tretinoin (TRI-LUMA EX), Apply 1 application topically at bedtime. .  gabapentin (NEURONTIN) 300 MG capsule, TAKE 1 CAPSULE BY MOUTH AT BEDTIME AS NEEDED FOR NERVE PAIN FOR THREE DAYS, THEN 2 TIMES A DAY .  Multiple Vitamin (MULTIVITAMIN WITH MINERALS) TABS, Take 1 tablet by mouth daily. .  Omega-3 Fatty Acids (FISH OIL) 1000 MG CAPS, Take 1,000 mg by mouth 2 (two) times daily. Vit d3 2000 IU .  ondansetron (ZOFRAN) 4 MG tablet, TAKE 1 TABLET BY MOUTH EVERY 6 HOURS AS NEEDED FOR NAUSEA .  OVER THE COUNTER MEDICATION, Take 2 tablets by mouth at bedtime. Formula 303  supplement for pain and rest .  OVER THE COUNTER MEDICATION, Take 600 mg by mouth 2 (two) times daily. Red yeast rice .  Tart Cherry (TART CHERRY ULTRA) 1200 MG CAPS, daily. Marland Kitchen  tretinoin (RETIN-A) 0.05 % cream, Apply 1 application topically at bedtime.   Reviewed prior external information including notes and imaging from  primary care provider As well as notes that were available from care everywhere and other healthcare systems.  Past medical history, social, surgical and family history all reviewed in electronic medical record.  No pertanent information unless stated regarding to the chief complaint.   Review of Systems:  No headache, visual changes, nausea, vomiting, diarrhea, constipation,  dizziness, abdominal pain, skin rash, fevers, chills, night sweats,  weight loss, swollen lymph nodes, body aches, joint swelling, chest pain, shortness of breath, mood changes. POSITIVE muscle aches  Objective  Last menstrual period 08/14/2012.   General: No apparent distress alert and oriented x3 mood and affect normal, dressed appropriately.  HEENT: Pupils equal, extraocular movements intact  Respiratory: Patient's speak in full sentences and does not appear short of breath  Cardiovascular: No lower extremity edema, non tender, no erythema  Gait normal with good balance and coordination.  MSK:  Non tender with full range of motion and good stability and symmetric strength and tone of shoulders, elbows, wrist, hip, knee and ankles bilaterally.     Impression and Recommendations:     The above documentation has been reviewed and is accurate and complete Jacqualin Combes

## 2020-06-08 ENCOUNTER — Ambulatory Visit: Payer: 59 | Admitting: Family Medicine

## 2020-06-10 DIAGNOSIS — Z96611 Presence of right artificial shoulder joint: Secondary | ICD-10-CM | POA: Diagnosis not present

## 2020-06-10 DIAGNOSIS — Z471 Aftercare following joint replacement surgery: Secondary | ICD-10-CM | POA: Diagnosis not present

## 2020-06-13 NOTE — Progress Notes (Signed)
Subjective: 60 year old female presents the office today for concerns of discomfort to her toes.  She does have hammertoes as well as a bunion.  She also has some calluses on both of her feet.  She is also concerned of toenail fungus.  She also Advised on running shoes.  She has no other concerns. Denies any systemic complaints such as fevers, chills, nausea, vomiting. No acute changes since last appointment, and no other complaints at this time.   Objective: AAO x3, NAD DP/PT pulses palpable bilaterally, CRT less than 3 seconds Hammertoes are present as well as mild bunion.  She does get discomfort in the hammertoes but no significant discomfort identified today.  Hyperkeratotic lesions bilaterally for total of 2 lesions.  There is no underlying ulceration drainage or signs of infection.  There is no evidence of foreign body.  Nails are mildly hypertrophic, dystrophic with yellow-brown discoloration.  No pain in the nails there is no redness or drainage or any signs of infection. No pain with calf compression, swelling, warmth, erythema  Assessment: Hyperkeratotic lesions, hammertoe/bunion deformity, onychomycosis  Plan: -All treatment options discussed with the patient including all alternatives, risks, complications.  -We discussed various toe separators for the hammertoes. -Prescribed Jublia for nail fungus -Discussed various types of running shoes -Hyperkeratotic lesion sharp debrided x2 without complications or bleeding.  Moisturizer daily. -Patient encouraged to call the office with any questions, concerns, change in symptoms.   Trula Slade DPM

## 2020-06-14 ENCOUNTER — Other Ambulatory Visit: Payer: Self-pay

## 2020-06-14 ENCOUNTER — Ambulatory Visit: Payer: 59 | Admitting: Physical Therapy

## 2020-06-14 DIAGNOSIS — M5416 Radiculopathy, lumbar region: Secondary | ICD-10-CM

## 2020-06-14 DIAGNOSIS — M25611 Stiffness of right shoulder, not elsewhere classified: Secondary | ICD-10-CM

## 2020-06-14 DIAGNOSIS — M6281 Muscle weakness (generalized): Secondary | ICD-10-CM

## 2020-06-14 DIAGNOSIS — M25511 Pain in right shoulder: Secondary | ICD-10-CM

## 2020-06-14 NOTE — Therapy (Signed)
[facility] [address], [place], [place], [address] Phone: [phone]   Fax:  [phone]  Physical Therapy Treatment  Patient Details  Name: Tara [name] MRN: [id number] Date of Birth: [birth year] Referring Provider (PT): Dr. Onnie Graham   Encounter Date: 06/14/2020   PT End of Session - 06/14/20 1634    Visit Number 7    Date for PT Re-Evaluation 07/29/20    Authorization Type UMR    PT Start Time 1446    PT Stop Time 1530    PT Time Calculation (min) 44 min    Activity Tolerance Patient tolerated treatment well           Past Medical History:  Diagnosis Date  . Anemia   . Arthritis   . Hyperlipidemia   . Insomnia   . Mitral valve prolapse   . PONV (postoperative nausea and vomiting)     Past Surgical History:  Procedure Laterality Date  . AUGMENTATION MAMMAPLASTY Bilateral    Dec 2019  . BREAST BIOPSY Right    axillary node benign  . [place]   right  . SHOULDER ARTHROSCOPY WITH LABRAL REPAIR Right 08/28/2012   Procedure: RIGHT SHOULDER ARTHROSCOPY WITH LABRAL DEBRIDEMENT, ASPIRATION AND DECOMPRESSION OF PARALABRAL CYST;  Surgeon: Marin Shutter, MD;  Location: [place];  Service: Orthopedics;  Laterality: Right;  . TOTAL SHOULDER ARTHROPLASTY Right 03/24/2020   Procedure: RIGHT REVERSE SHOULDER ARTHROPLASTY;  Surgeon: Justice Britain, MD;  Location: [facility];  Service: Orthopedics;  Laterality: Right;  171min    There were no vitals filed for this visit.   Subjective Assessment - 06/14/20 1448    Subjective Let's work on this leg.  The cushions don't help.  I saw the surgeon and he said I could do power walking but I tried it and it bothered my leg.  He said it be a while on internal and external rotation.    Pertinent History right reverse total shoulder replacement;  03/24/20;  right knee OA    How long can you sit comfortably? 5 min limited by left buttock and post thigh pain    Diagnostic  tests MRI L3 to S1 with nerve impingement left    Patient Stated Goals do my hair; lift in my cabinets; out of pain (dr thinks I'll be fully functional)    Currently in Pain? Yes    Pain Score 8     Pain Location Leg   and buttock left   Pain Orientation Left              [facility] PT Assessment - 06/14/20 0001      AROM   Right Shoulder Flexion 120 Degrees    Right Shoulder ABduction 110 Degrees    Right Shoulder Internal Rotation --   L5                        [facility] Adult PT Treatment/Exercise - 06/14/20 0001      Shoulder Exercises: Standing   Other Standing Exercises verbal review of shoulder HEP      Electrical Stimulation   Electrical Stimulation Location left lat HS and lateral gastroc and left piriformis    Electrical Stimulation Action premod with Dn    Electrical Stimulation Parameters with DN 4 min each location    Electrical Stimulation Goals Pain      Manual Therapy   Manual therapy comments piriformis contract/relax 3 x 5  sec hold    Joint Mobilization long leg distraction 2x 60 sec; left hip inferior distraction; AP in internal rotation; lumbar neutral gapping 3x 45 sec    Soft tissue mobilization right piriformis, lateral HS, lateral gastroc            Trigger Point Dry Needling - 06/14/20 0001    Consent Given? Yes    Muscles Treated Lower Quadrant Hamstring    Electrical Stimulation Performed with Dry Needling Yes    Vastus lateralis Response Palpable increased muscle length;Twitch response elicited    Hamstring Response Palpable increased muscle length    Gastrocnemius Response Twitch response elicited;Palpable increased muscle length    Piriformis Response Palpable increased muscle length                  PT Short Term Goals - 06/14/20 1643      PT SHORT TERM GOAL #1   Title The patient will have 120 degrees flexion ROM needed for reaching eye level for grooming/dressing    Time 6    Period Weeks    Status Partially Met       PT SHORT TERM GOAL #2   Title The patient will report a 40% improvement in right shoulder pain with basic home and work ADLS and sleep    Status Achieved      PT SHORT TERM GOAL #3   Title Abduction/scaption elevation to 100 degrees needed for driving    Status Achieved      PT SHORT TERM GOAL #4   Title FOTO functional score will improve to 55%    Time 6    Period Weeks    Status On-going      PT SHORT TERM GOAL #5   Title The patient will report a 25% improvement in left LE symptoms while sitting for driving and work duties    Time 6    Period Weeks    Status On-going             PT Long Term Goals - 05/12/20 1732      PT LONG TERM GOAL #1   Title The patient will be independent in safe self progression of HEP    Time 12    Period Weeks    Status On-going      PT LONG TERM GOAL #2   Title The patient will have 150 degrees of forward elevation needed for reaching into kitchen  cabinets    Time 12    Period Weeks    Status On-going      PT LONG TERM GOAL #3   Title The patient will have gross UE strength to lift dishes in the kitchen    Time 12    Period Weeks    Status On-going      PT LONG TERM GOAL #4   Title The patient will report a 60% improvement in right shoulder pain with most home and work activities    Time 12    Period Weeks    Status On-going      PT LONG TERM GOAL #5   Title FOTO score improved to 65%    Time 12    Period Weeks    Status On-going      Additional Long Term Goals   Additional Long Term Goals Yes      PT LONG TERM GOAL #6   Title The patient will report a 50% improvement in LE symptoms with driving and sitting for  work    Time [place] - 06/14/20 1635    Clinical Impression Statement The patient's primary complaint is left buttock pain and posterior thigh pain/behind the knee and therefore she would like to focus on this area rather than her shoulder.  She reports  high compliance with her shoulder ROM and demonstrates improving ROM in all planes of motion.  She is concerned about her tolerance for her upcoming trip to [place] on Friday.  She reports the DN and ES on her LE seemed to be helpful and she would like to continue with this intervention.  Added additional manual therapy including lumbar and hip mobilizations in addition to soft tissue mobilization.  Therapist closely monitoring response with all interventions.    Comorbidities multi region pain: degenerative changes left shoulder and right knee as well    Examination-Activity Limitations Bathing;Reach Overhead;Carry;Sleep;Dressing;Hygiene/Grooming;Toileting;Lift    Examination-Participation Restrictions Meal Prep;Cleaning;Occupation;Driving;Laundry    Stability/Clinical Decision Making Stable/Uncomplicated    Rehab Potential Good    PT Frequency 2x / week    PT Duration 12 weeks    PT Treatment/Interventions ADLs/Self Care Home Management;Aquatic Therapy;Cryotherapy;Electrical Stimulation;Moist Heat;Neuromuscular re-education;Therapeutic exercise;Therapeutic activities;Patient/family education;Manual techniques;Dry needling;Taping;Traction    PT Next Visit Plan assess response to DN/ES to left LE;  right shoulder ROM and strengthening phase 2 reverse total shoulder; recheck ROM; check FOTO and remaining STGS    PT Home Exercise Plan [email or web address]           Patient will benefit from skilled therapeutic intervention in order to improve the following deficits and impairments:  Decreased range of motion,Impaired UE functional use,Pain,Impaired perceived functional ability  Visit Diagnosis: Radiculopathy, lumbar region  Acute pain of right shoulder  Stiffness of right shoulder, not elsewhere classified  Muscle weakness (generalized)     Problem List Patient Active Problem List   Diagnosis Date Noted  . Low back pain 02/26/2020  . Nonallopathic lesion of sacral region 02/26/2020  . Hot  flashes 02/23/2019  . Other insomnia 02/23/2019  . Secondary osteoarthritis of left shoulder due to rotator cuff arthropathy 02/23/2019  . Primary osteoarthritis involving multiple joints 02/23/2019  . Cystic acne vulgaris 02/23/2019  . Osteopenia 02/23/2019  . Bunion of right foot 02/23/2019  . Varicose 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 02/23/2019  . Multiple atypical nevi 02/23/2019  . Polyarthralgia 08/25/2014  . Arthritis of right shoulder region 07/28/2014  . Impingement syndrome of right shoulder 06/20/2010  . Subacromial bursitis 06/20/2010  . Rotator cuff tendonitis 06/20/2010  . DYSPNEA 03/09/2010  . Mitral valve prolapse 03/03/2009  . HYPERTENSION, BENIGN 01/12/2009  . PURE HYPERCHOLESTEROLEMIA 01/03/2009  . MIGRAINE HEADACHE 01/03/2009  . CHEST PAIN UNSPECIFIED 01/03/2009  . ELEVATED BLOOD PRESSURE 01/03/2009   Ruben Im, PT 06/14/20 4:44 PM Phone: [phone] Fax: [phone] Alvera Singh 06/14/2020, 4:44 PM  [facility] [address], [place], [place], [address] Phone: [phone]   Fax:  [phone]  Name: Tara [name] MRN: [id number] Date of Birth: [birth year]

## 2020-06-16 ENCOUNTER — Ambulatory Visit: Payer: 59 | Admitting: Physical Therapy

## 2020-06-16 ENCOUNTER — Other Ambulatory Visit: Payer: Self-pay

## 2020-06-16 DIAGNOSIS — M5416 Radiculopathy, lumbar region: Secondary | ICD-10-CM

## 2020-06-16 DIAGNOSIS — M25511 Pain in right shoulder: Secondary | ICD-10-CM | POA: Diagnosis not present

## 2020-06-16 DIAGNOSIS — M6281 Muscle weakness (generalized): Secondary | ICD-10-CM | POA: Diagnosis not present

## 2020-06-16 DIAGNOSIS — M25611 Stiffness of right shoulder, not elsewhere classified: Secondary | ICD-10-CM | POA: Diagnosis not present

## 2020-06-16 NOTE — Therapy (Signed)
[facility] [address], [place], [place], [address] Phone: [phone]   Fax:  [phone]  Physical Therapy Treatment  Patient Details  Name: Tara [name] MRN: [id number] Date of Birth: [birth year] Referring Provider (PT): Dr. Onnie Graham   Encounter Date: 06/16/2020   PT End of Session - 06/16/20 1640    Visit Number 8    Date for PT Re-Evaluation 07/29/20    Authorization Type UMR    PT Start Time 1615    PT Stop Time 1655    PT Time Calculation (min) 40 min    Activity Tolerance Patient tolerated treatment well           Past Medical History:  Diagnosis Date  . Anemia   . Arthritis   . Hyperlipidemia   . Insomnia   . Mitral valve prolapse   . PONV (postoperative nausea and vomiting)     Past Surgical History:  Procedure Laterality Date  . AUGMENTATION MAMMAPLASTY Bilateral    Dec 2019  . BREAST BIOPSY Right    axillary node benign  . [place]   right  . SHOULDER ARTHROSCOPY WITH LABRAL REPAIR Right 08/28/2012   Procedure: RIGHT SHOULDER ARTHROSCOPY WITH LABRAL DEBRIDEMENT, ASPIRATION AND DECOMPRESSION OF PARALABRAL CYST;  Surgeon: Marin Shutter, MD;  Location: [place];  Service: Orthopedics;  Laterality: Right;  . TOTAL SHOULDER ARTHROPLASTY Right 03/24/2020   Procedure: RIGHT REVERSE SHOULDER ARTHROPLASTY;  Surgeon: Justice Britain, MD;  Location: [facility];  Service: Orthopedics;  Laterality: Right;  150mn    There were no vitals filed for this visit.   Subjective Assessment - 06/16/20 1616    Subjective Relief in leg after last visit but temporary.  [place]    Pertinent History right reverse total shoulder replacement;  03/24/20;  right knee OA    How long can you sit comfortably? 5 min limited by left buttock and post thigh pain    Patient Stated Goals do my hair; lift in my cabinets; out of pain (dr thinks I'll be fully functional)    Currently in  Pain? Yes    Pain Score 7     Pain Location Leg    Pain Orientation Left    Pain Type Acute pain                             [facility] Adult PT Treatment/Exercise - 06/16/20 0001      Electrical Stimulation   Electrical Stimulation Location left lat HS and lateral gastroc and left piriformis    Electrical Stimulation Action premod    Electrical Stimulation Parameters with DN 8 min    Electrical Stimulation Goals Pain      Manual Therapy   Manual therapy comments piriformis contract/relax 3 x 5 sec hold    Joint Mobilization long leg distraction 5x 60 sec; left hip inferior distraction; AP in internal rotation; lumbar neutral gapping 3x 45 sec    Soft tissue mobilization right piriformis, lateral HS, lateral gastroc    Other Manual Therapy traction with belt 3x 60 sec            Trigger Point Dry Needling - 06/16/20 0001    Consent Given? Yes    Muscles Treated Lower Quadrant Hamstring    Electrical Stimulation Performed with Dry Needling Yes    Vastus lateralis Response Palpable increased muscle length;Twitch response elicited  Hamstring Response Palpable increased muscle length    Gastrocnemius Response Twitch response elicited;Palpable increased muscle length    Piriformis Response Palpable increased muscle length                  PT Short Term Goals - 06/14/20 1643      PT SHORT TERM GOAL #1   Title The patient will have 120 degrees flexion ROM needed for reaching eye level for grooming/dressing    Time 6    Period Weeks    Status Partially Met      PT SHORT TERM GOAL #2   Title The patient will report a 40% improvement in right shoulder pain with basic home and work ADLS and sleep    Status Achieved      PT SHORT TERM GOAL #3   Title Abduction/scaption elevation to 100 degrees needed for driving    Status Achieved      PT SHORT TERM GOAL #4   Title FOTO functional score will improve to 55%    Time 6    Period Weeks    Status  On-going      PT SHORT TERM GOAL #5   Title The patient will report a 25% improvement in left LE symptoms while sitting for driving and work duties    Time 6    Period Weeks    Status On-going             PT Long Term Goals - 05/12/20 1732      PT LONG TERM GOAL #1   Title The patient will be independent in safe self progression of HEP    Time 12    Period Weeks    Status On-going      PT LONG TERM GOAL #2   Title The patient will have 150 degrees of forward elevation needed for reaching into kitchen  cabinets    Time 12    Period Weeks    Status On-going      PT LONG TERM GOAL #3   Title The patient will have gross UE strength to lift dishes in the kitchen    Time 12    Period Weeks    Status On-going      PT LONG TERM GOAL #4   Title The patient will report a 60% improvement in right shoulder pain with most home and work activities    Time 12    Period Weeks    Status On-going      PT LONG TERM GOAL #5   Title FOTO score improved to 65%    Time 12    Period Weeks    Status On-going      Additional Long Term Goals   Additional Long Term Goals Yes      PT LONG TERM GOAL #6   Title The patient will report a 50% improvement in LE symptoms with driving and sitting for work    Time 12    Period Weeks    Status New                 Plan - 06/16/20 1711    Clinical Impression Statement Treatment focus on right LE per patient request.  She continues to report high compliance with home shoulder ex program.  The patient reports relief of LE symptoms with decompression and distraction techniques although only temporary.  Discussed having her husband assist with long leg distraction oscillations while on her trip.  Good response  to DN with ES in L5/S1 dermatomal levels.  Therapist closely monitoring response throughout treatment session.    Comorbidities multi region pain: degenerative changes left shoulder and right knee as well    Examination-Activity  Limitations Bathing;Reach Overhead;Carry;Sleep;Dressing;Hygiene/Grooming;Toileting;Lift    Examination-Participation Restrictions Meal Prep;Cleaning;Occupation;Driving;Laundry    Rehab Potential Good    PT Frequency 2x / week    PT Duration 12 weeks    PT Treatment/Interventions ADLs/Self Care Home Management;Aquatic Therapy;Cryotherapy;Electrical Stimulation;Moist Heat;Neuromuscular re-education;Therapeutic exercise;Therapeutic activities;Patient/family education;Manual techniques;Dry needling;Taping;Traction    PT Next Visit Plan assess response following trip to [place];  DN/ES to left LE;  manual therapy to lumbo/pelvic/hip;   right shoulder ROM and strengthening phase 2 reverse total shoulder; recheck ROM; check FOTO and remaining [place]           Patient will benefit from skilled therapeutic intervention in order to improve the following deficits and impairments:  Decreased range of motion,Impaired UE functional use,Pain,Impaired perceived functional ability  Visit Diagnosis: Radiculopathy, lumbar region     Problem List Patient Active Problem List   Diagnosis Date Noted  . Low back pain 02/26/2020  . Nonallopathic lesion of sacral region 02/26/2020  . Hot flashes 02/23/2019  . Other insomnia 02/23/2019  . Secondary osteoarthritis of left shoulder due to rotator cuff arthropathy 02/23/2019  . Primary osteoarthritis involving multiple joints 02/23/2019  . Cystic acne vulgaris 02/23/2019  . Osteopenia 02/23/2019  . Bunion of right foot 02/23/2019  . Varicose 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 02/23/2019  . Multiple atypical nevi 02/23/2019  . Polyarthralgia 08/25/2014  . Arthritis of right shoulder region 07/28/2014  . Impingement syndrome of right shoulder 06/20/2010  . Subacromial bursitis 06/20/2010  . Rotator cuff tendonitis 06/20/2010  . DYSPNEA 03/09/2010  . Mitral valve prolapse 03/03/2009  . HYPERTENSION, BENIGN  01/12/2009  . PURE HYPERCHOLESTEROLEMIA 01/03/2009  . MIGRAINE HEADACHE 01/03/2009  . CHEST PAIN UNSPECIFIED 01/03/2009  . ELEVATED BLOOD PRESSURE 01/03/2009   Ruben Im, PT 06/16/20 5:17 PM Phone: [phone] Fax: [phone] Alvera Singh 06/16/2020, 5:16 PM  [facility] [address] [place], [place], [address] Phone: [phone]   Fax:  [phone]  Name: Tara [name] MRN: [id number] Date of Birth: [birth year]

## 2020-06-27 ENCOUNTER — Other Ambulatory Visit: Payer: Self-pay | Admitting: Podiatry

## 2020-06-27 DIAGNOSIS — M21619 Bunion of unspecified foot: Secondary | ICD-10-CM

## 2020-06-28 ENCOUNTER — Ambulatory Visit: Payer: 59 | Attending: Orthopedic Surgery | Admitting: Physical Therapy

## 2020-06-28 ENCOUNTER — Other Ambulatory Visit: Payer: Self-pay

## 2020-06-28 DIAGNOSIS — M25611 Stiffness of right shoulder, not elsewhere classified: Secondary | ICD-10-CM | POA: Insufficient documentation

## 2020-06-28 DIAGNOSIS — M5416 Radiculopathy, lumbar region: Secondary | ICD-10-CM | POA: Diagnosis not present

## 2020-06-28 DIAGNOSIS — M25511 Pain in right shoulder: Secondary | ICD-10-CM | POA: Insufficient documentation

## 2020-06-28 DIAGNOSIS — M6281 Muscle weakness (generalized): Secondary | ICD-10-CM | POA: Diagnosis not present

## 2020-06-28 NOTE — Therapy (Signed)
[facility] [address], [place], [place], [address] Phone: [phone]   Fax:  [phone]  Physical Therapy Treatment  Patient Details  Name: Tara [name] MRN: [id number] Date of Birth: 1960-08-07 Referring Provider (PT): Dr. Onnie Graham   Encounter Date: 06/28/2020   PT End of Session - 06/28/20 2033    Visit Number 9    Date for PT Re-Evaluation 07/29/20    Authorization Type UMR    PT Start Time 1015    PT Stop Time 1105    PT Time Calculation (min) 50 min    Activity Tolerance Patient tolerated treatment well           Past Medical History:  Diagnosis Date  . Anemia   . Arthritis   . Hyperlipidemia   . Insomnia   . Mitral valve prolapse   . PONV (postoperative nausea and vomiting)     Past Surgical History:  Procedure Laterality Date  . AUGMENTATION MAMMAPLASTY Bilateral    Dec 2019  . BREAST BIOPSY Right    axillary node benign  . [place]   right  . SHOULDER ARTHROSCOPY WITH LABRAL REPAIR Right 08/28/2012   Procedure: RIGHT SHOULDER ARTHROSCOPY WITH LABRAL DEBRIDEMENT, ASPIRATION AND DECOMPRESSION OF PARALABRAL CYST;  Surgeon: Marin Shutter, MD;  Location: [facility];  Service: Orthopedics;  Laterality: Right;  . TOTAL SHOULDER ARTHROPLASTY Right 03/24/2020   Procedure: RIGHT REVERSE SHOULDER ARTHROPLASTY;  Surgeon: Justice Britain, MD;  Location: [facility];  Service: Orthopedics;  Laterality: Right;  143min    There were no vitals filed for this visit.   Subjective Assessment - 06/28/20 1018    Subjective The leg was painful on the trip to [place].  I do think it's getting better in bits/pieces.  I'm trying to power walk.  The walking didn't jar the shoulder.  I need more internal and external rotation.    Pertinent History right reverse total shoulder replacement;  03/24/20;  right knee OA    Diagnostic tests MRI L3 to S1 with nerve impingement left    Patient Stated Goals do my hair;  lift in my cabinets; out of pain (dr thinks I'll be fully functional)    Currently in Pain? Yes    Pain Score 5     Pain Location Leg    Pain Orientation Left              [facility] PT Assessment - 06/28/20 0001      AROM   Right Shoulder Flexion 120 Degrees    Right Shoulder ABduction --   98 without compensation   Right Shoulder Internal Rotation --   L2 with assistance   Right Shoulder External Rotation 42 Degrees   manual assist                        [facility] Adult PT Treatment/Exercise - 06/28/20 0001      Moist Heat Therapy   Number Minutes Moist Heat 3 Minutes    Moist Heat Location Shoulder;Hip;Knee      Electrical Stimulation   Electrical Stimulation Location left lat HS and lateral gastroc and left piriformis    Electrical Stimulation Action pre mod    Electrical Stimulation Parameters with DN 8 min    Electrical Stimulation Goals Pain      Manual Therapy   Manual therapy comments soft tissue mob of right upper trap, supraspinatus, teres, subscapularis, infraspinatus  Soft tissue mobilization right piriformis, lateral HS, lateral gastroc    Other Manual Therapy traction with belt 3x 60 sec            Trigger Point Dry Needling - 06/28/20 0001    Consent Given? Yes    Muscles Treated Upper Quadrant Supraspinatus;Infraspinatus;Subscapularis;Teres major    Muscles Treated Lower Quadrant Hamstring    Electrical Stimulation Performed with Dry Needling Yes    Supraspinatus Response Palpable increased muscle length    Infraspinatus Response Palpable increased muscle length    Subscapularis Response Palpable increased muscle length    Teres major Response Palpable increased muscle length    Vastus lateralis Response Palpable increased muscle length;Twitch response elicited    Hamstring Response Palpable increased muscle length    Gastrocnemius Response Twitch response elicited;Palpable increased muscle length    Piriformis Response Palpable increased  muscle length                  PT Short Term Goals - 06/28/20 2047      PT SHORT TERM GOAL #1   Title The patient will have 120 degrees flexion ROM needed for reaching eye level for grooming/dressing    Status Achieved      PT SHORT TERM GOAL #2   Title The patient will report a 40% improvement in right shoulder pain with basic home and work ADLS and sleep    Status Achieved      PT SHORT TERM GOAL #3   Title Abduction/scaption elevation to 100 degrees needed for driving    Status Achieved      PT SHORT TERM GOAL #4   Title FOTO functional score will improve to 55%    Time 6    Period Weeks    Status On-going      PT SHORT TERM GOAL #5   Title The patient will report a 25% improvement in left LE symptoms while sitting for driving and work duties    Time 6    Period Weeks    Status On-going             PT Long Term Goals - 05/12/20 1732      PT LONG TERM GOAL #1   Title The patient will be independent in safe self progression of HEP    Time 12    Period Weeks    Status On-going      PT LONG TERM GOAL #2   Title The patient will have 150 degrees of forward elevation needed for reaching into kitchen  cabinets    Time 12    Period Weeks    Status On-going      PT LONG TERM GOAL #3   Title The patient will have gross UE strength to lift dishes in the kitchen    Time 12    Period Weeks    Status On-going      PT LONG TERM GOAL #4   Title The patient will report a 60% improvement in right shoulder pain with most home and work activities    Time 12    Period Weeks    Status On-going      PT LONG TERM GOAL #5   Title FOTO score improved to 65%    Time 12    Period Weeks    Status On-going      Additional Long Term Goals   Additional Long Term Goals Yes      PT LONG TERM GOAL #6  Title The patient will report a 50% improvement in LE symptoms with driving and sitting for work    Time [place] - 06/28/20 2033    Clinical Impression Statement The patient returns after her trip to [place] with continued LE pain although slightly improved in intensity.  Her shoulder ROM is improving overall but her biggest concern is lack of internal and external rotation.   Initiated DN of shoulder and scapular muscles to compliment her home ROM.  Good initial response to DN of both regions.  She may benefit from additional manual and inclusion of pectoral DN.    Comorbidities multi region pain: degenerative changes left shoulder and right knee as well    Rehab Potential Good    PT Frequency 2x / week    PT Duration 12 weeks    PT Treatment/Interventions ADLs/Self Care Home Management;Aquatic Therapy;Cryotherapy;Electrical Stimulation;Moist Heat;Neuromuscular re-education;Therapeutic exercise;Therapeutic activities;Patient/family education;Manual techniques;Dry needling;Taping;Traction    PT Next Visit Plan DN/ES to left LE and DN to pectorals/shoulder muscles;  manual therapy to lumbo/pelvic/hip;   right shoulder ROM and strengthening phase 2 reverse total shoulder; check FOTO and remaining STGS    PT Home Exercise Plan [id number]           Patient will benefit from skilled therapeutic intervention in order to improve the following deficits and impairments:  Decreased range of motion,Impaired UE functional use,Pain,Impaired perceived functional ability  Visit Diagnosis: Radiculopathy, lumbar region  Acute pain of right shoulder  Stiffness of right shoulder, not elsewhere classified  Muscle weakness (generalized)     Problem List Patient Active Problem List   Diagnosis Date Noted  . Low back pain 02/26/2020  . Nonallopathic lesion of sacral region 02/26/2020  . Hot flashes 02/23/2019  . Other insomnia 02/23/2019  . Secondary osteoarthritis of left shoulder due to rotator cuff arthropathy 02/23/2019  . Primary osteoarthritis involving multiple joints 02/23/2019  . Cystic acne  vulgaris 02/23/2019  . Osteopenia 02/23/2019  . Bunion of right foot 02/23/2019  . Varicose 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 02/23/2019  . Multiple atypical nevi 02/23/2019  . Polyarthralgia 08/25/2014  . Arthritis of right shoulder region 07/28/2014  . Impingement syndrome of right shoulder 06/20/2010  . Subacromial bursitis 06/20/2010  . Rotator cuff tendonitis 06/20/2010  . DYSPNEA 03/09/2010  . Mitral valve prolapse 03/03/2009  . HYPERTENSION, BENIGN 01/12/2009  . PURE HYPERCHOLESTEROLEMIA 01/03/2009  . MIGRAINE HEADACHE 01/03/2009  . CHEST PAIN UNSPECIFIED 01/03/2009  . ELEVATED BLOOD PRESSURE 01/03/2009   Ruben Im, PT 06/28/20 8:49 PM Phone: [phone] Fax: [phone] Alvera Singh 06/28/2020, 8:48 PM  [facility] [address], [place], [place], [address] Phone: [phone]   Fax:  [phone]  Name: JERYL WILBOURN MRN: [id number] Date of Birth: 1960/05/18

## 2020-07-01 ENCOUNTER — Ambulatory Visit: Payer: 59 | Admitting: Physical Therapy

## 2020-07-01 ENCOUNTER — Other Ambulatory Visit: Payer: Self-pay

## 2020-07-01 DIAGNOSIS — M6281 Muscle weakness (generalized): Secondary | ICD-10-CM

## 2020-07-01 DIAGNOSIS — M25611 Stiffness of right shoulder, not elsewhere classified: Secondary | ICD-10-CM

## 2020-07-01 DIAGNOSIS — M25511 Pain in right shoulder: Secondary | ICD-10-CM | POA: Diagnosis not present

## 2020-07-01 DIAGNOSIS — M5416 Radiculopathy, lumbar region: Secondary | ICD-10-CM

## 2020-07-01 NOTE — Therapy (Signed)
[facility] [address], [place], [place], [address] Phone: [phone]   Fax:  [phone]  Physical Therapy Treatment  Patient Details  Name: Tara [name] MRN: [id number] Date of Birth: Jul 25, 1960 Referring Provider (PT): Dr. Onnie Graham   Encounter Date: 07/01/2020   PT End of Session - 07/01/20 1249    Visit Number 10    Date for PT Re-Evaluation 07/29/20    Authorization Type UMR    PT Start Time 0800    PT Stop Time 0850    PT Time Calculation (min) 50 min    Activity Tolerance Patient tolerated treatment well           Past Medical History:  Diagnosis Date  . Anemia   . Arthritis   . Hyperlipidemia   . Insomnia   . Mitral valve prolapse   . PONV (postoperative nausea and vomiting)     Past Surgical History:  Procedure Laterality Date  . AUGMENTATION MAMMAPLASTY Bilateral    Dec 2019  . BREAST BIOPSY Right    axillary node benign  . [place]   right  . SHOULDER ARTHROSCOPY WITH LABRAL REPAIR Right 08/28/2012   Procedure: RIGHT SHOULDER ARTHROSCOPY WITH LABRAL DEBRIDEMENT, ASPIRATION AND DECOMPRESSION OF PARALABRAL CYST;  Surgeon: Marin Shutter, MD;  Location: [facility];  Service: Orthopedics;  Laterality: Right;  . TOTAL SHOULDER ARTHROPLASTY Right 03/24/2020   Procedure: RIGHT REVERSE SHOULDER ARTHROPLASTY;  Surgeon: Justice Britain, MD;  Location: [facility];  Service: Orthopedics;  Laterality: Right;  112min    There were no vitals filed for this visit.   Subjective Assessment - 07/01/20 0812    Subjective Sitting varies but a little longer tolerance 5 minutes typically.  Kicks in at a mile.  Needs to take short strides.    Pertinent History right reverse total shoulder replacement;  03/24/20;  right knee OA    How long can you sit comfortably? 5 min limited by left buttock and post thigh pain    How long can you walk comfortably? 1 mile    Diagnostic tests MRI L3 to S1 with nerve  impingement left    Patient Stated Goals do my hair; lift in my cabinets; out of pain (dr thinks I'll be fully functional)    Currently in Pain? Yes    Pain Score 3     Pain Location Leg    Pain Orientation Right    Pain Type Acute pain    Pain Score 0    Pain Location Shoulder    Pain Type Surgical pain              [facility] PT Assessment - 07/01/20 0001      Observation/Other Assessments   Focus on Therapeutic Outcomes (FOTO)  65%      AROM   Right Shoulder Flexion 122 Degrees    Right Shoulder ABduction 104 Degrees    Right Shoulder Internal Rotation --   L1 with assist   Right Shoulder External Rotation 30 Degrees                         [facility] Adult PT Treatment/Exercise - 07/01/20 0001      Moist Heat Therapy   Moist Heat Location Shoulder;Hip;Knee      Electrical Stimulation   Electrical Stimulation Location left lat HS and lateral gastroc and left piriformis    Electrical Stimulation Action pre mod  Electrical Stimulation Parameters with Dn 8 min    Electrical Stimulation Goals Pain      Manual Therapy   Manual therapy comments soft tissue mob of right upper trap, supraspinatus, teres, subscapularis, infraspinatus    Soft tissue mobilization right piriformis, lateral HS, lateral quads, gastroc            Trigger Point Dry Needling - 07/01/20 0001    Consent Given? Yes    Muscles Treated Upper Quadrant Supraspinatus;Infraspinatus;Subscapularis;Teres major;Pectoralis major;Pectoralis minor    Muscles Treated Lower Quadrant Hamstring    Electrical Stimulation Performed with Dry Needling Yes    Pectoralis Major Response Palpable increased muscle length    Pectoralis Minor Response Palpable increased muscle length    Supraspinatus Response Palpable increased muscle length    Infraspinatus Response Palpable increased muscle length    Subscapularis Response Palpable increased muscle length    Teres major Response Palpable increased muscle length     Vastus lateralis Response Palpable increased muscle length;Twitch response elicited    Hamstring Response Palpable increased muscle length    Gastrocnemius Response Twitch response elicited;Palpable increased muscle length    Piriformis Response Palpable increased muscle length                  PT Short Term Goals - 07/01/20 1254      PT SHORT TERM GOAL #1   Title The patient will have 120 degrees flexion ROM needed for reaching eye level for grooming/dressing    Status Achieved      PT SHORT TERM GOAL #2   Title The patient will report a 40% improvement in right shoulder pain with basic home and work ADLS and sleep    Status Achieved      PT SHORT TERM GOAL #3   Title Abduction/scaption elevation to 100 degrees needed for driving    Status Achieved      PT SHORT TERM GOAL #4   Title FOTO functional score will improve to 55%    Status Achieved      PT SHORT TERM GOAL #5   Title The patient will report a 25% improvement in left LE symptoms while sitting for driving and work duties    Status Achieved             PT Long Term Goals - 05/12/20 1732      PT LONG TERM GOAL #1   Title The patient will be independent in safe self progression of HEP    Time 12    Period Weeks    Status On-going      PT LONG TERM GOAL #2   Title The patient will have 150 degrees of forward elevation needed for reaching into kitchen  cabinets    Time 12    Period Weeks    Status On-going      PT LONG TERM GOAL #3   Title The patient will have gross UE strength to lift dishes in the kitchen    Time 12    Period Weeks    Status On-going      PT LONG TERM GOAL #4   Title The patient will report a 60% improvement in right shoulder pain with most home and work activities    Time 12    Period Weeks    Status On-going      PT LONG TERM GOAL #5   Title FOTO score improved to 65%    Time 12    Period Weeks  Status On-going      Additional Long Term Goals   Additional Long  Term Goals Yes      PT LONG TERM GOAL #6   Title The patient will report a 50% improvement in LE symptoms with driving and sitting for work    Time 12    Period Weeks    Status New                 Plan - 07/01/20 1249    Clinical Impression Statement The patient expresses confidence in her performance of her shoulder HEP and requests to focus on things she can't do for herself (manual therapy, DN/ES).  Her shoulder ROM improving especially with abduction today.  Her FOTO score has significantly improved from start of care from 45% to 65%.  Her LE symptoms are still present but seem to be less intense.  Her sitting time is variable but she is able to walk about a mile now.  Progressing with rehab goals.    Comorbidities multi region pain: degenerative changes left shoulder and right knee as well    Examination-Activity Limitations Bathing;Reach Overhead;Carry;Sleep;Dressing;Hygiene/Grooming;Toileting;Lift    Rehab Potential Good    PT Frequency 2x / week    PT Duration 12 weeks    PT Treatment/Interventions ADLs/Self Care Home Management;Aquatic Therapy;Cryotherapy;Electrical Stimulation;Moist Heat;Neuromuscular re-education;Therapeutic exercise;Therapeutic activities;Patient/family education;Manual techniques;Dry needling;Taping;Traction    PT Next Visit Plan DN/ES to left LE and DN to pectorals/shoulder muscles;  manual therapy to lumbo/pelvic/hip;   right shoulder ROM and strengthening phase 2 reverse total shoulder; check FOTO and remaining STGS           Patient will benefit from skilled therapeutic intervention in order to improve the following deficits and impairments:  Decreased range of motion,Impaired UE functional use,Pain,Impaired perceived functional ability  Visit Diagnosis: Radiculopathy, lumbar region  Acute pain of right shoulder  Stiffness of right shoulder, not elsewhere classified  Muscle weakness (generalized)     Problem List Patient Active Problem  List   Diagnosis Date Noted  . Low back pain 02/26/2020  . Nonallopathic lesion of sacral region 02/26/2020  . Hot flashes 02/23/2019  . Other insomnia 02/23/2019  . Secondary osteoarthritis of left shoulder due to rotator cuff arthropathy 02/23/2019  . Primary osteoarthritis involving multiple joints 02/23/2019  . Cystic acne vulgaris 02/23/2019  . Osteopenia 02/23/2019  . Bunion of right foot 02/23/2019  . Varicose 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 02/23/2019  . Multiple atypical nevi 02/23/2019  . Polyarthralgia 08/25/2014  . Arthritis of right shoulder region 07/28/2014  . Impingement syndrome of right shoulder 06/20/2010  . Subacromial bursitis 06/20/2010  . Rotator cuff tendonitis 06/20/2010  . DYSPNEA 03/09/2010  . Mitral valve prolapse 03/03/2009  . HYPERTENSION, BENIGN 01/12/2009  . PURE HYPERCHOLESTEROLEMIA 01/03/2009  . MIGRAINE HEADACHE 01/03/2009  . CHEST PAIN UNSPECIFIED 01/03/2009  . ELEVATED BLOOD PRESSURE 01/03/2009   Ruben Im, PT 07/01/20 12:56 PM Phone: [phone] Fax: [phone] Alvera Singh 07/01/2020, 12:55 PM  [facility] [address] [place], [place], [address] Phone: [phone]   Fax:  [phone]  Name: DONDI BURANDT MRN: [id number] Date of Birth: July 31, 1960

## 2020-07-05 ENCOUNTER — Encounter: Payer: 59 | Admitting: Physical Therapy

## 2020-07-08 ENCOUNTER — Ambulatory Visit: Payer: 59 | Admitting: Physical Therapy

## 2020-07-08 ENCOUNTER — Other Ambulatory Visit: Payer: Self-pay

## 2020-07-08 DIAGNOSIS — M25611 Stiffness of right shoulder, not elsewhere classified: Secondary | ICD-10-CM | POA: Diagnosis not present

## 2020-07-08 DIAGNOSIS — M6281 Muscle weakness (generalized): Secondary | ICD-10-CM | POA: Diagnosis not present

## 2020-07-08 DIAGNOSIS — M5416 Radiculopathy, lumbar region: Secondary | ICD-10-CM

## 2020-07-08 DIAGNOSIS — M25511 Pain in right shoulder: Secondary | ICD-10-CM | POA: Diagnosis not present

## 2020-07-08 NOTE — Therapy (Signed)
[facility] [address], [place], [place], [address] Phone: [phone]   Fax:  [phone]  Physical Therapy Treatment  Patient Details  Name: Tara [name] MRN: [id number] Date of Birth: 17-Aug-1960 Referring Provider (PT): Dr. Onnie Graham   Encounter Date: 07/08/2020   PT End of Session - 07/08/20 1120    Visit Number 11    Date for PT Re-Evaluation 07/29/20    Authorization Type UMR    PT Start Time 1015    PT Stop Time 1110   heat, DN   PT Time Calculation (min) 55 min    Activity Tolerance Patient tolerated treatment well           Past Medical History:  Diagnosis Date  . Anemia   . Arthritis   . Hyperlipidemia   . Insomnia   . Mitral valve prolapse   . PONV (postoperative nausea and vomiting)     Past Surgical History:  Procedure Laterality Date  . AUGMENTATION MAMMAPLASTY Bilateral    Dec 2019  . BREAST BIOPSY Right    axillary node benign  . [place]   right  . SHOULDER ARTHROSCOPY WITH LABRAL REPAIR Right 08/28/2012   Procedure: RIGHT SHOULDER ARTHROSCOPY WITH LABRAL DEBRIDEMENT, ASPIRATION AND DECOMPRESSION OF PARALABRAL CYST;  Surgeon: Marin Shutter, MD;  Location: [facility];  Service: Orthopedics;  Laterality: Right;  . TOTAL SHOULDER ARTHROPLASTY Right 03/24/2020   Procedure: RIGHT REVERSE SHOULDER ARTHROPLASTY;  Surgeon: Justice Britain, MD;  Location: [facility];  Service: Orthopedics;  Laterality: Right;  139min    There were no vitals filed for this visit.   Subjective Assessment - 07/08/20 1017    Subjective I jogged 2 minutes, then walked a few minutes for 2-3 miles.  It didn't bother my shoulder a bit but leg is still there. No better but no worse either.  Doing restorative yoga for stretching.  Doing weighted squats; pilates.    Pertinent History right reverse total shoulder replacement;  03/24/20;  right knee OA    Patient Stated Goals do my hair; lift in my cabinets; out of pain  (dr thinks I'll be fully functional)    Currently in Pain? Yes    Pain Score 3     Pain Location Leg    Pain Orientation Right    Pain Type Acute pain              [facility] PT Assessment - 07/08/20 0001      AROM   Right Shoulder Flexion 122 Degrees    Right Shoulder Internal Rotation --   L1 with assist   Right Shoulder External Rotation --   touch T1 behind the head                        [facility] Adult PT Treatment/Exercise - 07/08/20 0001      Lumbar Exercises: Standing   Other Standing Lumbar Exercises exercise discussion on areas of emphasis for pain reduction, return to function (jogging) like glute med and HS: review dead lifts and given green band for resisted hip floor clocks      Shoulder Exercises: Standing   Other Standing Exercises verbal review of shoulder HEP      Moist Heat Therapy   Number Minutes Moist Heat 5 Minutes    Moist Heat Location Shoulder;Hip;Knee      Electrical Stimulation   Electrical Stimulation Location med and lat HS and med/lat gastroc  Water engineer Parameters with Dn 8 min    Electrical Stimulation Goals Pain      Manual Therapy   Manual therapy comments soft tissue mob of right upper trap, supraspinatus, teres, subscapularis, infraspinatus    Soft tissue mobilization right piriformis; HS, gastroc; upper trap, teres, subscapularis            Trigger Point Dry Needling - 07/08/20 0001    Consent Given? Yes    Muscles Treated Upper Quadrant Supraspinatus;Infraspinatus;Subscapularis;Teres major;Pectoralis major;Pectoralis minor    Muscles Treated Lower Quadrant Hamstring    Electrical Stimulation Performed with Dry Needling Yes    Pectoralis Major Response Palpable increased muscle length    Pectoralis Minor Response Palpable increased muscle length    Supraspinatus Response Palpable increased muscle length    Infraspinatus Response Palpable increased muscle length     Subscapularis Response Palpable increased muscle length    Teres major Response Palpable increased muscle length    Vastus lateralis Response Palpable increased muscle length;Twitch response elicited    Hamstring Response Palpable increased muscle length    Gastrocnemius Response Twitch response elicited;Palpable increased muscle length    Piriformis Response Palpable increased muscle length                  PT Short Term Goals - 07/01/20 1254      PT SHORT TERM GOAL #1   Title The patient will have 120 degrees flexion ROM needed for reaching eye level for grooming/dressing    Status Achieved      PT SHORT TERM GOAL #2   Title The patient will report a 40% improvement in right shoulder pain with basic home and work ADLS and sleep    Status Achieved      PT SHORT TERM GOAL #3   Title Abduction/scaption elevation to 100 degrees needed for driving    Status Achieved      PT SHORT TERM GOAL #4   Title FOTO functional score will improve to 55%    Status Achieved      PT SHORT TERM GOAL #5   Title The patient will report a 25% improvement in left LE symptoms while sitting for driving and work duties    Status Achieved             PT Long Term Goals - 05/12/20 1732      PT LONG TERM GOAL #1   Title The patient will be independent in safe self progression of HEP    Time 12    Period Weeks    Status On-going      PT LONG TERM GOAL #2   Title The patient will have 150 degrees of forward elevation needed for reaching into kitchen  cabinets    Time 12    Period Weeks    Status On-going      PT LONG TERM GOAL #3   Title The patient will have gross UE strength to lift dishes in the kitchen    Time 12    Period Weeks    Status On-going      PT LONG TERM GOAL #4   Title The patient will report a 60% improvement in right shoulder pain with most home and work activities    Time 12    Period Weeks    Status On-going      PT LONG TERM GOAL #5   Title FOTO score  improved to 65%  Time 12    Period Weeks    Status On-going      Additional Long Term Goals   Additional Long Term Goals Yes      PT LONG TERM GOAL #6   Title The patient will report a 50% improvement in LE symptoms with driving and sitting for work    Time 12    Period Weeks    Status New                 Plan - 07/08/20 1128    Clinical Impression Statement The patient was able to return to a slow jog/walking intervals 2x this week with LE symptoms no worse as a result.  Improving shoulder ROM as well particularly external rotation behind the head.  Improved scapular/rib dissociation noted today.  She is highly compliant with her HEP for shoulder, restorative yoga for total body stretching and body weight LE strengthening.  Discussed benefit of resistive strengthening and targeted muscles for sucessful return to running (HS and glutes).  Therapist monitoring response to all interventions.    Examination-Activity Limitations Bathing;Reach Overhead;Carry;Sleep;Dressing;Hygiene/Grooming;Toileting;Lift    Examination-Participation Restrictions Meal Prep;Cleaning;Occupation;Driving;Laundry    Rehab Potential Good    PT Frequency 2x / week    PT Duration 12 weeks    PT Treatment/Interventions ADLs/Self Care Home Management;Aquatic Therapy;Cryotherapy;Electrical Stimulation;Moist Heat;Neuromuscular re-education;Therapeutic exercise;Therapeutic activities;Patient/family education;Manual techniques;Dry needling;Taping;Traction    PT Next Visit Plan DN/ES to left LE and DN to scapular/shoulder muscles;  manual therapy to lumbo/pelvic/hip;   right shoulder ROM and progressive strengthening    PT Home Exercise Plan [id number]           Patient will benefit from skilled therapeutic intervention in order to improve the following deficits and impairments:  Decreased range of motion,Impaired UE functional use,Pain,Impaired perceived functional ability  Visit Diagnosis: Radiculopathy, lumbar  region  Acute pain of right shoulder  Stiffness of right shoulder, not elsewhere classified  Muscle weakness (generalized)     Problem List Patient Active Problem List   Diagnosis Date Noted  . Low back pain 02/26/2020  . Nonallopathic lesion of sacral region 02/26/2020  . Hot flashes 02/23/2019  . Other insomnia 02/23/2019  . Secondary osteoarthritis of left shoulder due to rotator cuff arthropathy 02/23/2019  . Primary osteoarthritis involving multiple joints 02/23/2019  . Cystic acne vulgaris 02/23/2019  . Osteopenia 02/23/2019  . Bunion of right foot 02/23/2019  . Varicose 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 02/23/2019  . Multiple atypical nevi 02/23/2019  . Polyarthralgia 08/25/2014  . Arthritis of right shoulder region 07/28/2014  . Impingement syndrome of right shoulder 06/20/2010  . Subacromial bursitis 06/20/2010  . Rotator cuff tendonitis 06/20/2010  . DYSPNEA 03/09/2010  . Mitral valve prolapse 03/03/2009  . HYPERTENSION, BENIGN 01/12/2009  . PURE HYPERCHOLESTEROLEMIA 01/03/2009  . MIGRAINE HEADACHE 01/03/2009  . CHEST PAIN UNSPECIFIED 01/03/2009  . ELEVATED BLOOD PRESSURE 01/03/2009   Ruben Im, PT 07/08/20 11:34 AM Phone: [phone] Fax: [phone]  Alvera Singh 07/08/2020, 11:34 AM  [facility] [address] [place], [place], [address] Phone: [phone]   Fax:  [phone]  Name: Tara [name] MRN: [id number] Date of Birth: 03/22/1960

## 2020-07-11 NOTE — Progress Notes (Signed)
Telehealth Encounter (Email link to [email or web address] ) Medical Nutrition Therapy PCP Hollace Kinnier, DO  I connected with UNICE VANTASSEL (MRN [id number]) on 07/12/2020 by MyChart video-enabled, HIPAA-compliant telemedicine application, verified that I was speaking with the correct person using two identifiers, and that the patient was in a private environment conducive to confidentiality.  The patient agreed to proceed.  Persons participating in visit were patient and provider (registered dietitian) Kennith Center, PhD, RD, LDN, CEDRD.  Provider was located at [place] during this telehealth encounter; patient was at home.  Appt start time: 1500 end time: 1600 (1 hour)  Reason for telehealth visit: Referred for Medical Nutrition Therapy related to weight management and dietary management of lipids.  Relevant history/background: For years, Melisha has made cautious food choices, getting a lot of fruits and vegetables, limiting saturated fat, and kcal.  Her objectives potentially related to nutrition include: - pain relief (esp shoulders and other joints) - improve bone health (currently in osteoporotic range at all sites scanned) - better quality sleep - eliminate hot flashes - manage weight - no recurrence of elevated BP - minimize both cholesterol med's and supplements  Assessment:  Maghan had R shoulder surgery Feb 3, which was very complicated, and recovery has been difficult.  Her L shoulder will also need the same surgery.  She tried to maintain 2000 kcal/day, but gained weight, up to 145 lb by Nov/Dec 2021.  She has determined that her kcal intake limit is 1700 kcal/day for weight maintenance.   LDL is down (88 on 04/25/20); is still taking red yeast rice.  Main nutrition-related concern is bone health as well as weight mgmt.  Mitzi started going to [place] in August 2021, but has not returned since Feb surgery.   Weight today:  Self-report:  137.5 lb.  Says she is "miserable" from joint pain if gets back to 140 lb, but also recognizes she does not feel well if weight is as low as 130 lb.  Usual eating pattern: 3 meals and 3 snacks per day.   Recent usual physical activity: 30 min gentle stretching 5-6 X wk; M - 20-min functional wt training w/ hand weights & 30 min mat pilates; T - jogging 3 mi (12-30-mi pace); W - 20-min functional wt training; Th - jogging 3 mi (12-30-mi pace); F - +/- walk ~20 minutes; Sa - jogging 3-5 mi (13:30 pace).  Su - Rest day (+/- stretching).  Seeing Drue Second for PT twice weekly, including dry needling for hip/leg pain.   Sleep: Slightly improved: 5-6 hours/night, although still has some nights she doesn't sleep.  24-hr recall:  (Up at 5:30 AM; drank water) B (6 AM)-  32 oz smoothie: 2 c grn tea, 1 c spinach, 1/2 c frxn pineapple, 1 cucumber, 2 celery, mint, ginger, lemon juice; 4 tbsp pwdrd peanut butter (PB2), 1 slc sprouted bread Snk (9 AM)-  1 c Shr Wht, 1 tbsp cocoa, 1 tbsp raisins, 2 c almond milk Snk (11 AM)-  1 slc sprouted bread, 1 tbsp alm butter L (1:30 AM)-  1 large spinach, 1 tbsp olive oil, 5 olives, 1 oz feta, 1/2 c navy beans, 1/4 c (dry) oatmeal, 1/2 tbsp cocoa Snk (3 PM)-  1 slc sprouted bread, 1 tbsp alm butter  - 4:30 PM Pilates class -  D (6 PM)-  1/2 c garbanzos, 1 apple, 2 tbsp PB2, 2 c plain yogurt, psyllium Snk (8 PM)-  1 tbsp alm butter Typical day?  Yes.  Except usually gets afternoon or evening smoothie.    Nutritional Diagnosis: some progress noted on NB-1.1 Food and nutrition-related knowledge deficit as related to best dietary regimen to optimize both health and satisfaction as evidenced by recent LDL of 88.   Intervention: Completed diet and exercise history, assured patient she is on the right track with food choices and physical activity, and encouraged discontinuing food tracking.   For recommendations and goals, see Patient Instructions.    Follow-up: prn.     Rickesha Veracruz,JEANNIE

## 2020-07-12 ENCOUNTER — Encounter: Payer: 59 | Admitting: Physical Therapy

## 2020-07-12 ENCOUNTER — Ambulatory Visit (INDEPENDENT_AMBULATORY_CARE_PROVIDER_SITE_OTHER): Payer: 59 | Admitting: Family Medicine

## 2020-07-12 ENCOUNTER — Other Ambulatory Visit: Payer: Self-pay

## 2020-07-12 DIAGNOSIS — Z713 Dietary counseling and surveillance: Secondary | ICD-10-CM | POA: Diagnosis not present

## 2020-07-12 NOTE — Patient Instructions (Addendum)
Discontinue tracking food intake.daily.  As you are monitoring your weight weekly, you'll always know if you need to adjust intake.   Continue to get a good variety of foods, with balanced meals including protein, carb, and veg's.    With all the calcium sources you eat daily, you do not need to supplement at 1,000 mg/day.  Taking one 250-mg dose should be sufficient insurance that you are meeting Ca needs.    Email an update in a month or two.    Good to talk with you today!  Follow-up prn.

## 2020-07-15 ENCOUNTER — Ambulatory Visit: Payer: 59 | Admitting: Physical Therapy

## 2020-07-15 ENCOUNTER — Other Ambulatory Visit: Payer: Self-pay

## 2020-07-15 DIAGNOSIS — M5416 Radiculopathy, lumbar region: Secondary | ICD-10-CM

## 2020-07-15 DIAGNOSIS — M25511 Pain in right shoulder: Secondary | ICD-10-CM

## 2020-07-15 DIAGNOSIS — M6281 Muscle weakness (generalized): Secondary | ICD-10-CM

## 2020-07-15 DIAGNOSIS — M25611 Stiffness of right shoulder, not elsewhere classified: Secondary | ICD-10-CM

## 2020-07-15 NOTE — Therapy (Signed)
[facility] [address], [place], [place], [address] Phone: [phone]   Fax:  [phone]  Physical Therapy Treatment  Patient Details  Name: Tara [name] MRN: [id number] Date of Birth: 08-14-1960 Referring Provider (PT): Dr. Onnie Graham   Encounter Date: 07/15/2020   PT End of Session - 07/15/20 1220    Visit Number 12    Date for PT Re-Evaluation 07/29/20    Authorization Type UMR    PT Start Time 1015    PT Stop Time 1110    PT Time Calculation (min) 55 min    Activity Tolerance Patient tolerated treatment well           Past Medical History:  Diagnosis Date  . Anemia   . Arthritis   . Hyperlipidemia   . Insomnia   . Mitral valve prolapse   . PONV (postoperative nausea and vomiting)     Past Surgical History:  Procedure Laterality Date  . AUGMENTATION MAMMAPLASTY Bilateral    Dec 2019  . BREAST BIOPSY Right    axillary node benign  . [place]   right  . SHOULDER ARTHROSCOPY WITH LABRAL REPAIR Right 08/28/2012   Procedure: RIGHT SHOULDER ARTHROSCOPY WITH LABRAL DEBRIDEMENT, ASPIRATION AND DECOMPRESSION OF PARALABRAL CYST;  Surgeon: Marin Shutter, MD;  Location: [facility];  Service: Orthopedics;  Laterality: Right;  . TOTAL SHOULDER ARTHROPLASTY Right 03/24/2020   Procedure: RIGHT REVERSE SHOULDER ARTHROPLASTY;  Surgeon: Justice Britain, MD;  [facility];  Service: Orthopedics;  Laterality: Right;  182min    There were no vitals filed for this visit.   Subjective Assessment - 07/15/20 1242    Subjective The sitting is not a lot better but the good news is I was unable to do 3 miles before it kicked in.  Stretched Sunday.  Did Pilates Monday.  Tuesday when running had calf, HS pain, buttock.    Pertinent History right reverse total shoulder replacement;  03/24/20;  right knee OA    How long can you sit comfortably? 5 min limited by left buttock and post thigh pain;  harder to sit after  running;  husband's car is a little better.    Diagnostic tests MRI L3 to S1 with nerve impingement left    Patient Stated Goals do my hair; lift in my cabinets; out of pain (dr thinks I'll be fully functional)    Currently in Pain? Yes    Pain Location Leg    Pain Score 0    Pain Location Shoulder                             [facility] Adult PT Treatment/Exercise - 07/15/20 0001      Shoulder Exercises: ROM/Strengthening   Other ROM/Strengthening Exercises green loop wrist supported internal rotation behind back    Other ROM/Strengthening Exercises doorframe external rotation fixed self or husbands assist      Moist Heat Therapy   Number Minutes Moist Heat 5 Minutes    Moist Heat Location Shoulder;Hip;Knee      Manual Therapy   Manual therapy comments soft tissue mob of right upper trap, supraspinatus, teres, subscapularis, infraspinatus    Soft tissue mobilization right piriformis; HS, gastroc; upper trap, teres, subscapularis            Trigger Point Dry Needling - 07/15/20 0001    Consent Given? Yes    Muscles Treated Upper Quadrant Supraspinatus;Infraspinatus;Subscapularis;Teres  major;Pectoralis major;Pectoralis minor    Muscles Treated Lower Quadrant Hamstring    Electrical Stimulation Performed with Dry Needling Yes    Pectoralis Major Response Palpable increased muscle length    Pectoralis Minor Response Palpable increased muscle length    Supraspinatus Response Palpable increased muscle length    Infraspinatus Response Palpable increased muscle length    Subscapularis Response Palpable increased muscle length    Teres major Response Palpable increased muscle length    Vastus lateralis Response Palpable increased muscle length;Twitch response elicited    Hamstring Response Palpable increased muscle length    Gastrocnemius Response Twitch response elicited;Palpable increased muscle length    Piriformis Response Palpable increased muscle length                   PT Short Term Goals - 07/01/20 1254      PT SHORT TERM GOAL #1   Title The patient will have 120 degrees flexion ROM needed for reaching eye level for grooming/dressing    Status Achieved      PT SHORT TERM GOAL #2   Title The patient will report a 40% improvement in right shoulder pain with basic home and work ADLS and sleep    Status Achieved      PT SHORT TERM GOAL #3   Title Abduction/scaption elevation to 100 degrees needed for driving    Status Achieved      PT SHORT TERM GOAL #4   Title FOTO functional score will improve to 55%    Status Achieved      PT SHORT TERM GOAL #5   Title The patient will report a 25% improvement in left LE symptoms while sitting for driving and work duties    Status Achieved             PT Long Term Goals - 05/12/20 1732      PT LONG TERM GOAL #1   Title The patient will be independent in safe self progression of HEP    Time 12    Period Weeks    Status On-going      PT LONG TERM GOAL #2   Title The patient will have 150 degrees of forward elevation needed for reaching into kitchen  cabinets    Time 12    Period Weeks    Status On-going      PT LONG TERM GOAL #3   Title The patient will have gross UE strength to lift dishes in the kitchen    Time 12    Period Weeks    Status On-going      PT LONG TERM GOAL #4   Title The patient will report a 60% improvement in right shoulder pain with most home and work activities    Time 12    Period Weeks    Status On-going      PT LONG TERM GOAL #5   Title FOTO score improved to 65%    Time 12    Period Weeks    Status On-going      Additional Long Term Goals   Additional Long Term Goals Yes      PT LONG TERM GOAL #6   Title The patient will report a 50% improvement in LE symptoms with driving and sitting for work    Time 12    Period Weeks    Status New                 Plan -  07/15/20 1220    Clinical Impression Statement Discussed options for  improved shoulder internal and external rotation ROM.  She continues to be highly compliant with her exercise program including Pilates (modifies accordingly for her shoulder) She reports funtional improvements including increased mileage of walking/running before symptom onset.  Still present with sitting but more variable now depending on prior activity or seat surface.   Responding to well to manual therapy and DN for LE symptom management and improved shoulder ROM.    Comorbidities multi region pain: degenerative changes left shoulder and right knee as well    Examination-Activity Limitations Bathing;Reach Overhead;Carry;Sleep;Dressing;Hygiene/Grooming;Toileting;Lift    Stability/Clinical Decision Making Stable/Uncomplicated    Rehab Potential Good    PT Frequency 2x / week    PT Duration 12 weeks    PT Treatment/Interventions ADLs/Self Care Home Management;Aquatic Therapy;Cryotherapy;Electrical Stimulation;Moist Heat;Neuromuscular re-education;Therapeutic exercise;Therapeutic activities;Patient/family education;Manual techniques;Dry needling;Taping;Traction    PT Next Visit Plan check pectoral length;  DN/ES to left LE and DN to scapular/shoulder muscles;  manual therapy to lumbo/pelvic/hip;   right shoulder ROM and progressive strengthening    PT Home Exercise Plan [email or web address]           Patient will benefit from skilled therapeutic intervention in order to improve the following deficits and impairments:  Decreased range of motion,Impaired UE functional use,Pain,Impaired perceived functional ability  Visit Diagnosis: Radiculopathy, lumbar region  Acute pain of right shoulder  Stiffness of right shoulder, not elsewhere classified  Muscle weakness (generalized)     Problem List Patient Active Problem List   Diagnosis Date Noted  . Low back pain 02/26/2020  . Nonallopathic lesion of sacral region 02/26/2020  . Hot flashes 02/23/2019  . Other insomnia 02/23/2019  . Secondary  osteoarthritis of left shoulder due to rotator cuff arthropathy 02/23/2019  . Primary osteoarthritis involving multiple joints 02/23/2019  . Cystic acne vulgaris 02/23/2019  . Osteopenia 02/23/2019  . Bunion of right foot 02/23/2019  . Varicose 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 02/23/2019  . Multiple atypical nevi 02/23/2019  . Polyarthralgia 08/25/2014  . Arthritis of right shoulder region 07/28/2014  . Impingement syndrome of right shoulder 06/20/2010  . Subacromial bursitis 06/20/2010  . Rotator cuff tendonitis 06/20/2010  . DYSPNEA 03/09/2010  . Mitral valve prolapse 03/03/2009  . HYPERTENSION, BENIGN 01/12/2009  . PURE HYPERCHOLESTEROLEMIA 01/03/2009  . MIGRAINE HEADACHE 01/03/2009  . CHEST PAIN UNSPECIFIED 01/03/2009  . ELEVATED BLOOD PRESSURE 01/03/2009   Ruben Im, PT 07/15/20 12:47 PM Phone: [phone] Fax: [phone] Alvera Singh 07/15/2020, 12:47 PM  [facility] [address], [place], [place], [address] Phone: [phone]   Fax:  [phone]  Name: SHAVELLE RUNKEL MRN: [id number] Date of Birth: 1960/12/18

## 2020-07-19 ENCOUNTER — Ambulatory Visit: Payer: 59 | Admitting: Physical Therapy

## 2020-07-19 ENCOUNTER — Other Ambulatory Visit: Payer: Self-pay

## 2020-07-19 DIAGNOSIS — M25511 Pain in right shoulder: Secondary | ICD-10-CM | POA: Diagnosis not present

## 2020-07-19 DIAGNOSIS — M25611 Stiffness of right shoulder, not elsewhere classified: Secondary | ICD-10-CM | POA: Diagnosis not present

## 2020-07-19 DIAGNOSIS — M5416 Radiculopathy, lumbar region: Secondary | ICD-10-CM | POA: Diagnosis not present

## 2020-07-19 DIAGNOSIS — M6281 Muscle weakness (generalized): Secondary | ICD-10-CM | POA: Diagnosis not present

## 2020-07-19 NOTE — Therapy (Signed)
[facility] [address], [place], [place], [address] Phone: [phone]   Fax:  [phone]  Physical Therapy Treatment  Patient Details  Name: Tara [name] MRN: [id number] Date of Birth: [birth year] Referring Provider (PT): Dr. Onnie Graham   Encounter Date: 07/19/2020   PT End of Session - 07/19/20 1748    Visit Number 13    Date for PT Re-Evaluation 07/29/20    Authorization Type UMR    PT Start Time 1102    PT Stop Time 1153    PT Time Calculation (min) 51 min    Activity Tolerance Patient tolerated treatment well           Past Medical History:  Diagnosis Date  . Anemia   . Arthritis   . Hyperlipidemia   . Insomnia   . Mitral valve prolapse   . PONV (postoperative nausea and vomiting)     Past Surgical History:  Procedure Laterality Date  . AUGMENTATION MAMMAPLASTY Bilateral    Dec 2019  . BREAST BIOPSY Right    axillary node benign  . [place]   right  . SHOULDER ARTHROSCOPY WITH LABRAL REPAIR Right 08/28/2012   Procedure: RIGHT SHOULDER ARTHROSCOPY WITH LABRAL DEBRIDEMENT, ASPIRATION AND DECOMPRESSION OF PARALABRAL CYST;  Surgeon: Marin Shutter, MD;  Location: [facility];  Service: Orthopedics;  Laterality: Right;  . TOTAL SHOULDER ARTHROPLASTY Right 03/24/2020   Procedure: RIGHT REVERSE SHOULDER ARTHROPLASTY;  Surgeon: Justice Britain, MD;  Location: [facility];  Service: Orthopedics;  Laterality: Right;  139min    There were no vitals filed for this visit.   Subjective Assessment - 07/19/20 1105    Subjective Had symptoms at the 3 mile point while running and did 5 miles total.   Symptoms mostly posterior knee and calf.  Less in buttock.  Both upper shoulders near clavicle pain.    Pertinent History right reverse total shoulder replacement;  03/24/20;  right knee OA    How long can you sit comfortably? 5 min limited by left buttock and post thigh pain;  harder to sit after running;  husband's car  is a little better.    Currently in Pain? Yes    Pain Score 3     Pain Location Leg    Pain Orientation Left    Pain Score 3    Pain Location Shoulder    Pain Orientation Right    Pain Type Surgical pain                             [facility] Adult PT Treatment/Exercise - 07/19/20 0001      Moist Heat Therapy   Moist Heat Location Shoulder;Hip;Knee      Electrical Stimulation   Electrical Stimulation Location med and lateral HS/calf    Electrical Stimulation Action pre mod    Electrical Stimulation Parameters with DN 8 min    Electrical Stimulation Goals Pain      Manual Therapy   Manual therapy comments soft tissue mob of right upper trap, supraspinatus, teres, subscapularis, infraspinatus    Soft tissue mobilization HS, gastroc; upper trap, teres, subscapularis, pectoral, upper trap            Trigger Point Dry Needling - 07/19/20 0001    Consent Given? Yes    Muscles Treated Upper Quadrant Supraspinatus;Infraspinatus;Subscapularis;Teres major;Pectoralis major;Pectoralis minor    Muscles Treated Lower Quadrant Hamstring    Electrical Stimulation Performed  with Dry Needling Yes    Pectoralis Major Response Palpable increased muscle length    Pectoralis Minor Response Palpable increased muscle length    Infraspinatus Response Palpable increased muscle length    Subscapularis Response Palpable increased muscle length    Teres major Response Palpable increased muscle length    Vastus lateralis Response Palpable increased muscle length;Twitch response elicited    Hamstring Response Palpable increased muscle length    Gastrocnemius Response Twitch response elicited;Palpable increased muscle length                  PT Short Term Goals - 07/01/20 1254      PT SHORT TERM GOAL #1   Title The patient will have 120 degrees flexion ROM needed for reaching eye level for grooming/dressing    Status Achieved      PT SHORT TERM GOAL #2   Title The patient  will report a 40% improvement in right shoulder pain with basic home and work ADLS and sleep    Status Achieved      PT SHORT TERM GOAL #3   Title Abduction/scaption elevation to 100 degrees needed for driving    Status Achieved      PT SHORT TERM GOAL #4   Title FOTO functional score will improve to 55%    Status Achieved      PT SHORT TERM GOAL #5   Title The patient will report a 25% improvement in left LE symptoms while sitting for driving and work duties    Status Achieved             PT Long Term Goals - 05/12/20 1732      PT LONG TERM GOAL #1   Title The patient will be independent in safe self progression of HEP    Time 12    Period Weeks    Status On-going      PT LONG TERM GOAL #2   Title The patient will have 150 degrees of forward elevation needed for reaching into kitchen  cabinets    Time 12    Period Weeks    Status On-going      PT LONG TERM GOAL #3   Title The patient will have gross UE strength to lift dishes in the kitchen    Time 12    Period Weeks    Status On-going      PT LONG TERM GOAL #4   Title The patient will report a 60% improvement in right shoulder pain with most home and work activities    Time 12    Period Weeks    Status On-going      PT LONG TERM GOAL #5   Title FOTO score improved to 65%    Time 12    Period Weeks    Status On-going      Additional Long Term Goals   Additional Long Term Goals Yes      PT LONG TERM GOAL #6   Title The patient will report a 50% improvement in LE symptoms with driving and sitting for work    Time 12    Period Weeks    Status New                 Plan - 07/19/20 1748    Clinical Impression Statement Notable improvements with return to function including running 3 miles before LE symptom activation but able to complete 5 miles total.  Good overall response to DN  including application of ES in T3-M4 dermatomal pattern and manual therapy.  She continues to be highly compliant with  right postsurgical HEP for shoulder ROM (especially internal and external rotation) as well UE strengthening.  She is having left shoulder issues as well which affects function.  Therapist monitoring response with all interventions.     Comorbidities multi region pain: degenerative changes left shoulder and right knee as well    Examination-Activity Limitations Bathing;Reach Overhead;Carry;Sleep;Dressing;Hygiene/Grooming;Toileting;Lift    Examination-Participation Restrictions Meal Prep;Cleaning;Occupation;Driving;Laundry    Rehab Potential Good    PT Frequency 2x / week    PT Duration 12 weeks    PT Treatment/Interventions ADLs/Self Care Home Management;Aquatic Therapy;Cryotherapy;Electrical Stimulation;Moist Heat;Neuromuscular re-education;Therapeutic exercise;Therapeutic activities;Patient/family education;Manual techniques;Dry needling;Taping;Traction    PT Next Visit Plan ERO next visit;   DN/ES to left LE and DN to scapular/shoulder muscles;  manual therapy to lumbo/pelvic/hip;   right shoulder ROM and progressive strengthening    PT Home Exercise Plan [email or web address]           Patient will benefit from skilled therapeutic intervention in order to improve the following deficits and impairments:     Visit Diagnosis: Radiculopathy, lumbar region  Acute pain of right shoulder  Stiffness of right shoulder, not elsewhere classified  Muscle weakness (generalized)     Problem List Patient Active Problem List   Diagnosis Date Noted  . Low back pain 02/26/2020  . Nonallopathic lesion of sacral region 02/26/2020  . Hot flashes 02/23/2019  . Other insomnia 02/23/2019  . Secondary osteoarthritis of left shoulder due to rotator cuff arthropathy 02/23/2019  . Primary osteoarthritis involving multiple joints 02/23/2019  . Cystic acne vulgaris 02/23/2019  . Osteopenia 02/23/2019  . Bunion of right foot 02/23/2019  . Varicose 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 02/23/2019  .  Multiple atypical nevi 02/23/2019  . Polyarthralgia 08/25/2014  . Arthritis of right shoulder region 07/28/2014  . Impingement syndrome of right shoulder 06/20/2010  . Subacromial bursitis 06/20/2010  . Rotator cuff tendonitis 06/20/2010  . DYSPNEA 03/09/2010  . Mitral valve prolapse 03/03/2009  . HYPERTENSION, BENIGN 01/12/2009  . PURE HYPERCHOLESTEROLEMIA 01/03/2009  . MIGRAINE HEADACHE 01/03/2009  . CHEST PAIN UNSPECIFIED 01/03/2009  . ELEVATED BLOOD PRESSURE 01/03/2009   Ruben Im, PT 07/19/20 5:55 PM Phone: [phone] Fax: [phone] Alvera Singh 07/19/2020, 5:54 PM  [facility] [address] [place], [place], [address] Phone: [phone]   Fax:  [phone]  Name: Tara [name] MRN: [id number] Date of Birth: 1960-10-18

## 2020-07-22 ENCOUNTER — Encounter: Payer: 59 | Admitting: Physical Therapy

## 2020-08-02 ENCOUNTER — Other Ambulatory Visit (HOSPITAL_COMMUNITY): Payer: Self-pay

## 2020-08-05 ENCOUNTER — Ambulatory Visit: Payer: 59 | Attending: Orthopedic Surgery | Admitting: Physical Therapy

## 2020-08-05 ENCOUNTER — Other Ambulatory Visit: Payer: Self-pay

## 2020-08-05 DIAGNOSIS — M5416 Radiculopathy, lumbar region: Secondary | ICD-10-CM | POA: Diagnosis not present

## 2020-08-05 DIAGNOSIS — M25611 Stiffness of right shoulder, not elsewhere classified: Secondary | ICD-10-CM | POA: Insufficient documentation

## 2020-08-05 DIAGNOSIS — M6281 Muscle weakness (generalized): Secondary | ICD-10-CM | POA: Diagnosis not present

## 2020-08-05 DIAGNOSIS — M25511 Pain in right shoulder: Secondary | ICD-10-CM | POA: Diagnosis not present

## 2020-08-05 NOTE — Patient Instructions (Signed)
Access Code: [email or web address] URL: [email or web address] Date: 08/05/2020 Prepared by: Ruben Im  Exercises Seated Shoulder Flexion AAROM with Pulley Behind - 1 x daily - 7 x weekly - 3 sets - 10 reps Seated Shoulder Abduction AAROM with Pulley Behind (Mirrored) - 1 x daily - 7 x weekly - 3 sets - 10 reps Standing Shoulder Internal Rotation AAROM with Pulley - 1 x daily - 7 x weekly - 3 sets - 10 reps Standing Isometric Shoulder Abduction with Doorway (Mirrored) - 1 x daily - 7 x weekly - 1 sets - 5 reps - 5 hold Standing Bilateral Low Shoulder Row with Anchored Resistance - 1 x daily - 7 x weekly - 2 sets - 10 reps Single Arm Shoulder Extension with Anchored Resistance - 1 x daily - 7 x weekly - 2 sets - 10 reps Standing Shoulder Single Arm Flexion with Anchored Resistance - 1 x daily - 7 x weekly - 2 sets - 10 reps Standing Shoulder Internal Rotation with Anchored Resistance - 1 x daily - 7 x weekly - 2 sets - 10 reps Shoulder External Rotation with Anchored Resistance - 1 x daily - 7 x weekly - 2 sets - 10 reps Standing Shoulder Alphabet - 1 x daily - 7 x weekly - 3 sets - 10 reps Standing Wall Ball Circles with Mini Swiss Ball - 1 x daily - 7 x weekly - 3 sets - 10 reps Shoulder Flexion Wall Slide with Towel - 1 x daily - 7 x weekly - 2 sets - 10 reps Standing Shoulder Flexion Wall Walk (Mirrored) - 1 x daily - 7 x weekly - 2 sets - 10 reps Standing Shoulder Flexion AAROM with Pulley Behind - 1 x daily - 7 x weekly - 3 sets - 10 reps Standing Shoulder Scaption AAROM with Pulley Behind - 1 x daily - 7 x weekly - 3 sets - 10 reps Standing Shoulder Abduction AAROM with Pulley Behind - 1 x daily - 7 x weekly - 3 sets - 10 reps Seated Slump Nerve Glide - 1 x daily - 7 x weekly - 1 sets - 10 reps

## 2020-08-05 NOTE — Therapy (Signed)
[facility] [address], [place], [place], [address] Phone: [phone]   Fax:  [phone]  Physical Therapy Treatment/Recertification   Patient Details  Name: Tara [name] MRN: [id number] Date of Birth: Mar 29, 1960 Referring Provider (PT): Dr. Onnie Graham   Encounter Date: 08/05/2020   PT End of Session - 08/05/20 1420     Visit Number 14    Date for PT Re-Evaluation 10/28/20    Authorization Type UMR    PT Start Time 0850    PT Stop Time 0940    PT Time Calculation (min) 50 min    Activity Tolerance Patient tolerated treatment well             Past Medical History:  Diagnosis Date   Anemia    Arthritis    Hyperlipidemia    Insomnia    Mitral valve prolapse    PONV (postoperative nausea and vomiting)     Past Surgical History:  Procedure Laterality Date   AUGMENTATION MAMMAPLASTY Bilateral    Dec 2019   BREAST BIOPSY Right    axillary node benign   KNEE SURGERY  1988   right   SHOULDER ARTHROSCOPY WITH LABRAL REPAIR Right 08/28/2012   Procedure: RIGHT SHOULDER ARTHROSCOPY WITH LABRAL DEBRIDEMENT, ASPIRATION AND DECOMPRESSION OF PARALABRAL CYST;  Surgeon: Marin Shutter, MD;  Location: [facility];  Service: Orthopedics;  Laterality: Right;   TOTAL SHOULDER ARTHROPLASTY Right 03/24/2020   Procedure: RIGHT REVERSE SHOULDER ARTHROPLASTY;  Surgeon: Justice Britain, MD;  Location: [facility];  Service: Orthopedics;  Laterality: Right;  169min    There were no vitals filed for this visit.   Subjective Assessment - 08/05/20 0851     Subjective My right shoulder is doing well.   I'm frustrated with this leg.  I don't think I getting to the root.  I'm going back to see Thedore Mins next week.  I'm working hard on changing running pattern and building glute.  3 miles before it bothers me.    Pertinent History right reverse total shoulder replacement;  03/24/20;  right knee OA    How long can you sit comfortably? variable 1  minute in the car this morning;    Patient Stated Goals do my hair; lift in my cabinets; out of pain (dr thinks I'll be fully functional)    Currently in Pain? No/denies    Pain Score 0-No pain    Pain Location Shoulder    Pain Orientation Right    Pain Type Acute pain    Pain Score 6    Pain Location Leg    Pain Orientation Left                [facility] PT Assessment - 08/05/20 0001       Observation/Other Assessments   Focus on Therapeutic Outcomes (FOTO)  64%      AROM   Right Shoulder Flexion 130 Degrees    Right Shoulder ABduction 112 Degrees    Right Shoulder Internal Rotation --   L1   Right Shoulder External Rotation 23 Degrees   T3     Strength   Overall Strength Comments Able to lift water bottle, coffee mug, casserole dish with 2 hands grossly 3+/5                           [facility] Adult PT Treatment/Exercise - 08/05/20 0001       Lumbar Exercises: Seated  Other Seated Lumbar Exercises nerve floss 10x right/left      Shoulder Exercises: Pulleys   Flexion Limitations 2# weight above 1 handle: flexion, scaption, abduction right/left      Electrical Stimulation   Electrical Stimulation Location med and lateral HS/calf    Electrical Stimulation Action pre mod    Electrical Stimulation Parameters with DN 10 min    Electrical Stimulation Goals Pain      Manual Therapy   Soft tissue mobilization med/lateral HS; lateral/medial gastroc              Trigger Point Dry Needling - 08/05/20 0001     Consent Given? Yes    Electrical Stimulation Performed with Dry Needling Yes    E-stim with Dry Needling Details LE    Vastus lateralis Response Palpable increased muscle length;Twitch response elicited    Hamstring Response Palpable increased muscle length    Gastrocnemius Response Twitch response elicited;Palpable increased muscle length                  PT Education - 08/05/20 1409     Education Details pulley with weight;  seated  nerve floss    Person(s) Educated Patient    Methods Explanation;Demonstration;Other (comment)   put on app/email   Comprehension Verbalized understanding;Returned demonstration              PT Short Term Goals - 08/05/20 1441       PT SHORT TERM GOAL #1   Title The patient will have 120 degrees flexion ROM needed for reaching eye level for grooming/dressing    Status Achieved      PT SHORT TERM GOAL #2   Title The patient will report a 40% improvement in right shoulder pain with basic home and work ADLS and sleep    Status Achieved      PT SHORT TERM GOAL #3   Title Abduction/scaption elevation to 100 degrees needed for driving    Status Achieved      PT SHORT TERM GOAL #4   Title FOTO functional score will improve to 55%    Status Achieved      PT SHORT TERM GOAL #5   Title The patient will report a 25% improvement in left LE symptoms while sitting for driving and work duties    Status Achieved               PT Long Term Goals - 08/05/20 1442       PT LONG TERM GOAL #1   Title The patient will be independent in safe self progression of HEP    Time 12    Period Weeks    Status On-going    Target Date 10/28/20      PT LONG TERM GOAL #2   Title The patient will have 150 degrees of forward elevation needed for reaching into kitchen  cabinets    Time 12    Period Weeks    Status On-going      PT LONG TERM GOAL #3   Title The patient will have gross UE strength to lift dishes in the kitchen    Status Achieved      PT LONG TERM GOAL #4   Title The patient will be able to don/doff a pullover shirt and apply deodorant with minimal modifications and pain level <3/10    Time 12    Period Weeks    Status Revised      PT LONG TERM GOAL #  5   Title FOTO score improved to 65%    Time 12    Period Weeks    Status On-going      PT LONG TERM GOAL #6   Title The patient will report a 40% improvement in LE symptoms with driving and sitting for work    Time 12     Period Weeks    Status Revised      PT LONG TERM GOAL #7   Title The patient will be able to sit for 5 minutes with minimal to no LE symptoms    Time 12    Period Weeks    Status New                   Plan - 08/05/20 1423     Clinical Impression Statement The patient is progressing with right shoulder rehab following reverse TSR with active ROM: flexion 130, abduction/scaption 112, internal rotation to L1 behind the back and external rotation to T3.  Strength grossly 3+/5.  Functionally she can lift her water bottle, put a mug on a higher shelf and lift a casserole dish with 2 hands.  She is still quite limited with donning/doffing a pullover shirt, hooking her bra or zipper in the back and applying deodorant.  Her recovery has been impacted by left shoulder degenerative changes and particularly by left LE peripheral pain for which she is also being treated.  She has made improvements in walking and running time to 3 miles before symptom onset however her sitting tolerance is only 1-3 minutes before symptom onset.  She rates her overall LE symptom improvement at 25%.   She would benefit from continued PT to address these impairments and for pain management.    Personal Factors and Comorbidities Comorbidity 1    Comorbidities multi region pain: degenerative changes left shoulder and right knee as well    Examination-Activity Limitations Bathing;Reach Overhead;Carry;Sleep;Dressing;Hygiene/Grooming;Toileting;Lift    Examination-Participation Restrictions Meal Prep;Cleaning;Occupation;Driving;Laundry    Rehab Potential Good    PT Frequency 1x / week    PT Duration 12 weeks    PT Treatment/Interventions ADLs/Self Care Home Management;Aquatic Therapy;Cryotherapy;Electrical Stimulation;Moist Heat;Neuromuscular re-education;Therapeutic exercise;Therapeutic activities;Patient/family education;Manual techniques;Dry needling;Taping;Traction    PT Next Visit Plan follow up on shoulder pulley  with weight and seated nerve floss;   DN/ES to left LE and DN to scapular/shoulder muscles;  manual therapy to lumbo/pelvic/hip;   right shoulder ROM and progressive strengthening    PT Home Exercise Plan [email or web address]             Patient will benefit from skilled therapeutic intervention in order to improve the following deficits and impairments:  Decreased range of motion, Impaired UE functional use, Pain, Impaired perceived functional ability  Visit Diagnosis: Acute pain of right shoulder - Plan: PT plan of care cert/re-cert  Stiffness of right shoulder, not elsewhere classified - Plan: PT plan of care cert/re-cert  Radiculopathy, lumbar region - Plan: PT plan of care cert/re-cert     Problem List Patient Active Problem List   Diagnosis Date Noted   Low back pain 02/26/2020   Nonallopathic lesion of sacral region 02/26/2020   Hot flashes 02/23/2019   Other insomnia 02/23/2019   Secondary osteoarthritis of left shoulder due to rotator cuff arthropathy 02/23/2019   Primary osteoarthritis involving multiple joints 02/23/2019   Cystic acne vulgaris 02/23/2019   Osteopenia 02/23/2019   Bunion of right foot 02/23/2019   Varicose 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 02/23/2019  Multiple atypical nevi 02/23/2019   Polyarthralgia 08/25/2014   Arthritis of right shoulder region 07/28/2014   Impingement syndrome of right shoulder 06/20/2010   Subacromial bursitis 06/20/2010   Rotator cuff tendonitis 06/20/2010   DYSPNEA 03/09/2010   Mitral valve prolapse 03/03/2009   HYPERTENSION, BENIGN 01/12/2009   PURE HYPERCHOLESTEROLEMIA 01/03/2009   MIGRAINE HEADACHE 01/03/2009   CHEST PAIN UNSPECIFIED 01/03/2009   ELEVATED BLOOD PRESSURE 01/03/2009   Ruben Im, PT 08/05/20 2:49 PM Phone: [phone] Fax: [phone]  Alvera Singh 08/05/2020, 2:48 PM  [facility] [address], [place], [place],  [address] Phone: [phone]   Fax:  [phone]  Name: Tara [name] MRN: [id number] Date of Birth: January 02, 1961

## 2020-08-12 ENCOUNTER — Other Ambulatory Visit: Payer: Self-pay

## 2020-08-12 ENCOUNTER — Ambulatory Visit: Payer: 59 | Admitting: Physical Therapy

## 2020-08-12 DIAGNOSIS — M25611 Stiffness of right shoulder, not elsewhere classified: Secondary | ICD-10-CM

## 2020-08-12 DIAGNOSIS — M5416 Radiculopathy, lumbar region: Secondary | ICD-10-CM

## 2020-08-12 DIAGNOSIS — M25511 Pain in right shoulder: Secondary | ICD-10-CM | POA: Diagnosis not present

## 2020-08-12 DIAGNOSIS — M6281 Muscle weakness (generalized): Secondary | ICD-10-CM

## 2020-08-12 NOTE — Therapy (Signed)
[facility] [address] [place], [place], [address] Phone: [phone]   Fax:  [phone]  Physical Therapy Treatment  Patient Details  Name: Tara [name] MRN: [id number] Date of Birth: 1960-05-13 Referring Provider (PT): Dr. Onnie Graham   Encounter Date: 08/12/2020   PT End of Session - 08/12/20 1243     Visit Number 15    Date for PT Re-Evaluation 10/28/20    Authorization Type UMR    PT Start Time 0755    PT Stop Time 0850   heat; DN   PT Time Calculation (min) 55 min    Activity Tolerance Patient tolerated treatment well             Past Medical History:  Diagnosis Date   Anemia    Arthritis    Hyperlipidemia    Insomnia    Mitral valve prolapse    PONV (postoperative nausea and vomiting)     Past Surgical History:  Procedure Laterality Date   AUGMENTATION MAMMAPLASTY Bilateral    Dec 2019   BREAST BIOPSY Right    axillary node benign   KNEE SURGERY  1988   right   SHOULDER ARTHROSCOPY WITH LABRAL REPAIR Right 08/28/2012   Procedure: RIGHT SHOULDER ARTHROSCOPY WITH LABRAL DEBRIDEMENT, ASPIRATION AND DECOMPRESSION OF PARALABRAL CYST;  Surgeon: Marin Shutter, MD;  Location: [facility];  Service: Orthopedics;  Laterality: Right;   TOTAL SHOULDER ARTHROPLASTY Right 03/24/2020   Procedure: RIGHT REVERSE SHOULDER ARTHROPLASTY;  Surgeon: Justice Britain, MD;  Location: [facility];  Service: Orthopedics;  Laterality: Right;  160min    There were no vitals filed for this visit.   Subjective Assessment - 08/12/20 0757     Subjective I was sore last Saturday and couldn't run.  I went to the chiro and that helped but it doesn't sustain.  Helped my upper back and neck.    Pertinent History right reverse total shoulder replacement;  03/24/20;  right knee OA    How long can you sit comfortably? variable 1 minute in the car this morning;    Patient Stated Goals do my hair; lift in my cabinets; out of pain (dr thinks  I'll be fully functional)    Currently in Pain? Yes    Pain Score 7     Pain Location Hip    Pain Orientation Left    Aggravating Factors  a little below knee upper calf    Pain Score 0    Pain Location Shoulder    Pain Orientation Right    Pain Radiating Towards the left shoulder hurts too                               [facility] Adult PT Treatment/Exercise - 08/12/20 0001       Moist Heat Therapy   Number Minutes Moist Heat 10 Minutes    Moist Heat Location Shoulder;Hip;Knee      Electrical Stimulation   Electrical Stimulation Location med and lateral HS/calf    Electrical Stimulation Goals Pain      Manual Therapy   Manual therapy comments soft tissue mob of right upper trap, supraspinatus, teres, subscapularis, infraspinatus    Soft tissue mobilization med/lateral HS; lateral/medial gastroc              Trigger Point Dry Needling - 08/12/20 0001     Consent Given? Yes    Muscles Treated Upper Quadrant Supraspinatus;Infraspinatus;Subscapularis;Teres  major;Pectoralis major;Pectoralis minor    Muscles Treated Lower Quadrant Hamstring    Electrical Stimulation Performed with Dry Needling Yes    Pectoralis Major Response Palpable increased muscle length    Pectoralis Minor Response Palpable increased muscle length    Infraspinatus Response Palpable increased muscle length    Subscapularis Response Palpable increased muscle length    Teres major Response Palpable increased muscle length    Vastus lateralis Response Palpable increased muscle length;Twitch response elicited    Hamstring Response Palpable increased muscle length    Gastrocnemius Response Twitch response elicited;Palpable increased muscle length                    PT Short Term Goals - 08/05/20 1441       PT SHORT TERM GOAL #1   Title The patient will have 120 degrees flexion ROM needed for reaching eye level for grooming/dressing    Status Achieved      PT SHORT TERM GOAL  #2   Title The patient will report a 40% improvement in right shoulder pain with basic home and work ADLS and sleep    Status Achieved      PT SHORT TERM GOAL #3   Title Abduction/scaption elevation to 100 degrees needed for driving    Status Achieved      PT SHORT TERM GOAL #4   Title FOTO functional score will improve to 55%    Status Achieved      PT SHORT TERM GOAL #5   Title The patient will report a 25% improvement in left LE symptoms while sitting for driving and work duties    Status Achieved               PT Long Term Goals - 08/05/20 1442       PT LONG TERM GOAL #1   Title The patient will be independent in safe self progression of HEP    Time 12    Period Weeks    Status On-going    Target Date 10/28/20      PT LONG TERM GOAL #2   Title The patient will have 150 degrees of forward elevation needed for reaching into kitchen  cabinets    Time 12    Period Weeks    Status On-going      PT LONG TERM GOAL #3   Title The patient will have gross UE strength to lift dishes in the kitchen    Status Achieved      PT LONG TERM GOAL #4   Title The patient will be able to don/doff a pullover shirt and apply deodorant with minimal modifications and pain level <3/10    Time 12    Period Weeks    Status Revised      PT LONG TERM GOAL #5   Title FOTO score improved to 65%    Time 12    Period Weeks    Status On-going      PT LONG TERM GOAL #6   Title The patient will report a 40% improvement in LE symptoms with driving and sitting for work    Time 12    Period Weeks    Status Revised      PT LONG TERM GOAL #7   Title The patient will be able to sit for 5 minutes with minimal to no LE symptoms    Time 12    Period Weeks    Status New  Plan - 08/12/20 1244     Clinical Impression Statement Higher intensity of LE pain this week, still variable but produced most often with sitting.  Tender points identified in piriformis and  gluteal muscles which could be restrictive around the sciatic nerve and contributing to peripheral symptoms.  Improved soft tissue length following Dn and manual therapy.  Good response to DN of shoulder and periscapular musculature with improved scapular/thoracic dissociation.  Treatment also focused on releasing restricted muscles needed for shoulder rotation ROM.  Therapist monitoring response with all interventions.    Comorbidities multi region pain: degenerative changes left shoulder and right knee as well    Examination-Activity Limitations Bathing;Reach Overhead;Carry;Sleep;Dressing;Hygiene/Grooming;Toileting;Lift    Examination-Participation Restrictions Meal Prep;Cleaning;Occupation;Driving;Laundry    Stability/Clinical Decision Making Stable/Uncomplicated    Rehab Potential Good    PT Frequency 1x / week    PT Duration 12 weeks    PT Treatment/Interventions ADLs/Self Care Home Management;Aquatic Therapy;Cryotherapy;Electrical Stimulation;Moist Heat;Neuromuscular re-education;Therapeutic exercise;Therapeutic activities;Patient/family education;Manual techniques;Dry needling;Taping;Traction    PT Next Visit Plan follow up on shoulder pulley with weight and seated nerve floss;   DN/ES to left LE and DN to scapular/shoulder muscles;  manual therapy to lumbo/pelvic/hip;   right shoulder ROM and progressive strengthening    PT Home Exercise Plan [id number]             Patient will benefit from skilled therapeutic intervention in order to improve the following deficits and impairments:     Visit Diagnosis: Acute pain of right shoulder  Radiculopathy, lumbar region  Muscle weakness (generalized)  Stiffness of right shoulder, not elsewhere classified     Problem List Patient Active Problem List   Diagnosis Date Noted   Low back pain 02/26/2020   Nonallopathic lesion of sacral region 02/26/2020   Hot flashes 02/23/2019   Other insomnia 02/23/2019   Secondary osteoarthritis of  left shoulder due to rotator cuff arthropathy 02/23/2019   Primary osteoarthritis involving multiple joints 02/23/2019   Cystic acne vulgaris 02/23/2019   Osteopenia 02/23/2019   Bunion of right foot 02/23/2019   Varicose 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 02/23/2019   Multiple atypical nevi 02/23/2019   Polyarthralgia 08/25/2014   Arthritis of right shoulder region 07/28/2014   Impingement syndrome of right shoulder 06/20/2010   Subacromial bursitis 06/20/2010   Rotator cuff tendonitis 06/20/2010   DYSPNEA 03/09/2010   Mitral valve prolapse 03/03/2009   HYPERTENSION, BENIGN 01/12/2009   PURE HYPERCHOLESTEROLEMIA 01/03/2009   MIGRAINE HEADACHE 01/03/2009   CHEST PAIN UNSPECIFIED 01/03/2009   ELEVATED BLOOD PRESSURE 01/03/2009   Ruben Im, PT 08/12/20 12:49 PM Phone: [phone] Fax: [phone]  Alvera Singh 08/12/2020, 12:49 PM  [facility] [address], [place], [place], [address] Phone: [phone]   Fax:  [phone]  Name: SALEM MASTROGIOVANNI MRN: [id number] Date of Birth: 1960-08-13

## 2020-08-16 NOTE — Progress Notes (Signed)
[facility] [address] [place] Phone: [phone] Subjective:   I Kandace Blitz am serving as a Education administrator for Dr. Hulan Saas.  This visit occurred during the SARS-CoV-2 public health emergency.  Safety protocols were in place, including screening questions prior to the visit, additional usage of staff PPE, and extensive cleaning of exam room while observing appropriate contact time as indicated for disinfecting solutions.   I'm seeing this patient by the request  of:  [name], [name], NP  CC: Low back pain, shoulder pain  OHY:[id number]  02/26/2020 Mild radicular symptoms that seem to go down the left leg.  Patient will have x-rays of this as well as the pelvis.  Dressing there is some arthritic changes.  Discussed gabapentin which patient declined.  Possible further work-up will be necessary if this does not respond to the conservative therapy including the home exercises that were given with the athletic trainer.  Follow-up again in 6 weeks  Patient has significant arthritic changes of multiple joints.  Previously has had a positive ANA but a low titer.  Patient had seen a rheumatologist for a couple years but did not make any significant differences did not start on any other medications significantly.  Patient is trying to do things naturally and does not want to take other medicines on a regular basis if possible.  We discussed laboratory work-up to further evaluate including uric acid and a Lyme titer.  Patient's arthritis is significantly more pronounced than what would be anticipated for somebody her age and her activity level.  Depending on labs this could change medical management.  We will consider the possibility of osteopathic manipulation for some symptom relief the patient is undergoing shoulder replacement so we will hold at this time.  Follow-up again 6 weeks after surgery  Update 08/17/2020 JESSALYN HINOJOSA is a 60 y.o. female  coming in with complaint of LBP and left shoulder pain.  Patient has been in formal physical therapy.  Since we have seen patient patient has had a shoulder replacement.  Patient did not started physical therapy in April and has continued with last appointment June 24.  This was for the shoulder as well as some muscle weakness.  Patient states the left shoulder is what it is. States the back/leg issue is at a 10.  Patient did have abnormal x-rays of the lumbar spine and was sent for an MRI.  MRI in January 2022 showed that patient did have a very small disc herniation on the left side at L4-L5 causing potentially a left L4 radiculopathy as well as some degenerative endplate arthritis causing narrowing of the foramen of the right L4-L5 and L5-S1.  Patient also did have a laboratory work-up that was completely unremarkable.  Past Medical History:  Diagnosis Date   Anemia    Arthritis    Hyperlipidemia    Insomnia    Mitral valve prolapse    PONV (postoperative nausea and vomiting)    Past Surgical History:  Procedure Laterality Date   AUGMENTATION MAMMAPLASTY Bilateral    Dec 2019   BREAST BIOPSY Right    axillary node benign   KNEE SURGERY  1988   right   SHOULDER ARTHROSCOPY WITH LABRAL REPAIR Right 08/28/2012   Procedure: RIGHT SHOULDER ARTHROSCOPY WITH LABRAL DEBRIDEMENT, ASPIRATION AND DECOMPRESSION OF PARALABRAL CYST;  Surgeon: Marin Shutter, MD;  Location: [facility];  Service: Orthopedics;  Laterality: Right;   TOTAL SHOULDER ARTHROPLASTY Right 03/24/2020  Procedure: RIGHT REVERSE SHOULDER ARTHROPLASTY;  Surgeon: Justice Britain, MD;  Location: [facility];  Service: Orthopedics;  Laterality: Right;  174min   Social History   Socioeconomic History   Marital status: Married    Spouse name: Not on file   Number of children: Not on file   Years of education: Not on file   Highest education level: Not on file  Occupational History   Not on file  Tobacco Use   Smoking status: Never    Smokeless tobacco: Never  Vaping Use   Vaping Use: Never used  Substance and Sexual Activity   Alcohol use: No   Drug use: No   Sexual activity: Not on file  Other Topics Concern   Not on file  Social History Narrative   Tobacco use, amount per day now: 0   Past tobacco use, amount per day: 0   How many years did you use tobacco: 0   Alcohol use (drinks per week): 0   Diet: Heart Healthy/DASH Diet/ Lean and Clean type foods.   Do you drink/eat things with caffeine: 1 glass of Tea in the morning.   Marital status: Married                                 What year were you married? 1987   Do you live in a house, apartment, assisted living, condo, trailer, etc.? House   Is it one or more stories? Yes   How many persons live in your home? 2 ( Husband/ Self )   Do you have pets in your home?( please list) Not currently, Dog Deceased.    Current or past profession: PhD/RN   Do you exercise?    Yes                              Type and how often? 6 Days a week.   Do you have a living will? Yes   Do you have a DNR form?   No                               If not, do you want to discuss one? Yes   Do you have signed POA/HPOA forms? Yes                       If so, please bring to you appointment   Social Determinants of Health   Financial Resource Strain: Not on file  Food Insecurity: Not on file  Transportation Needs: Not on file  Physical Activity: Not on file  Stress: Not on file  Social Connections: Not on file   Allergies  Allergen Reactions   Codeine Nausea And Vomiting   Hydrocodone Nausea And Vomiting   Triple Antibiotic Pain Relief [Neomy-Bacit-Polymyx-Pramoxine] Other (See Comments)    Burn   Triple Antibiotic W/Hydrocortisone  [Bacitra-Neomycin-Polymyxin-Hc]    Family History  Problem Relation Age of Onset   Stroke Mother    Sudden death Neg Hx    Hypertension Neg Hx    Hyperlipidemia Neg Hx    Heart attack Neg Hx    Diabetes Neg Hx       Current Outpatient  Medications (Respiratory):    cetirizine (ZYRTEC) 10 MG tablet, Take 10 mg by mouth daily as needed for allergies  or rhinitis.   fluticasone (FLONASE) 50 MCG/ACT nasal spray, Place 1 spray into both nostrils 3 (three) times daily as needed for rhinitis or allergies.    Current Outpatient Medications (Other):    CALCIUM CITRATE PO, Take 400 mg by mouth 3 (three) times daily. Number varies each day based on calcium ingested in food   Cholecalciferol (VITAMIN D3) 50 MCG (2000 UT) TABS, Take 1 tablet by mouth daily.   Efinaconazole 10 % SOLN, Apply 1 drop topically daily.   Fluocin-Hydroquinone-Tretinoin (TRI-LUMA EX), Apply 1 application topically at bedtime.   Multiple Vitamin (MULTIVITAMIN WITH MINERALS) TABS, Take 1 tablet by mouth daily.   Omega-3 Fatty Acids (FISH OIL) 1000 MG CAPS, Take 1,000 mg by mouth 2 (two) times daily. Vit d3 2000 IU   OVER THE COUNTER MEDICATION, Take 2 tablets by mouth at bedtime. Formula 303  supplement for pain and rest   OVER THE COUNTER MEDICATION, Take 600 mg by mouth 2 (two) times daily. Red yeast rice   Tart Cherry (TART CHERRY ULTRA) 1200 MG CAPS, daily.   tretinoin (RETIN-A) 0.05 % cream, Apply 1 application topically at bedtime.   Reviewed prior external information including notes and imaging from  primary care provider As well as notes that were available from care everywhere and other healthcare systems.  Past medical history, social, surgical and family history all reviewed in electronic medical record.  No pertanent information unless stated regarding to the chief complaint.   Review of Systems:  No headache, visual changes, nausea, vomiting, diarrhea, constipation, dizziness, abdominal pain, skin rash, fevers, chills, night sweats, , swollen lymph nodes,  joint swelling, chest pain, shortness of breath, mood changes. POSITIVE muscle aches, body aches, weight loss, muscle fatigue  Objective  Blood pressure 110/78, pulse 64, height 5\' 10"  (1.778  m), weight 142 lb (64.4 kg), last menstrual period 08/14/2012, SpO2 100 %.   General: No apparent distress alert and oriented x3 mood and affect normal, dressed appropriately.  HEENT: Pupils equal, extraocular movements intact  Respiratory: Patient's speak in full sentences and does not appear short of breath  Cardiovascular: No lower extremity edema, non tender, no erythema  Gait normal with good balance and coordination.  MSK: Patient does appear to be cachectic.  He still has good core strength noted.  Patient is nontender in the abdomen area.  Severe tenderness over the paraspinal musculature of the lumbar spine more on the left than the right.  Positive Faber on the left.  Straight leg test does cause some radicular symptoms consistent in the L4 distribution.  No significant weakness though.  Osteopathic findings T4 extended rotated and side bent right L4 flexed rotated and side bent left Sacrum right on right    Impression and Recommendations:    The above documentation has been reviewed and is accurate and complete Lyndal Pulley, DO

## 2020-08-17 ENCOUNTER — Ambulatory Visit (INDEPENDENT_AMBULATORY_CARE_PROVIDER_SITE_OTHER): Payer: 59 | Admitting: Family Medicine

## 2020-08-17 ENCOUNTER — Other Ambulatory Visit: Payer: Self-pay

## 2020-08-17 ENCOUNTER — Encounter: Payer: Self-pay | Admitting: Family Medicine

## 2020-08-17 VITALS — BP 110/78 | HR 64 | Ht 70.0 in | Wt 142.0 lb

## 2020-08-17 DIAGNOSIS — G8929 Other chronic pain: Secondary | ICD-10-CM | POA: Diagnosis not present

## 2020-08-17 DIAGNOSIS — R109 Unspecified abdominal pain: Secondary | ICD-10-CM

## 2020-08-17 DIAGNOSIS — M9903 Segmental and somatic dysfunction of lumbar region: Secondary | ICD-10-CM | POA: Diagnosis not present

## 2020-08-17 DIAGNOSIS — M9904 Segmental and somatic dysfunction of sacral region: Secondary | ICD-10-CM

## 2020-08-17 DIAGNOSIS — M9902 Segmental and somatic dysfunction of thoracic region: Secondary | ICD-10-CM | POA: Diagnosis not present

## 2020-08-17 DIAGNOSIS — M5442 Lumbago with sciatica, left side: Secondary | ICD-10-CM | POA: Diagnosis not present

## 2020-08-17 DIAGNOSIS — M999 Biomechanical lesion, unspecified: Secondary | ICD-10-CM

## 2020-08-17 NOTE — Assessment & Plan Note (Signed)

## 2020-08-17 NOTE — Patient Instructions (Addendum)
Good to see you CT abdomen and pelvis Lets see if anything else gives [place] more guidance Hopefully manipulation helps See me again in 5 weeks

## 2020-08-17 NOTE — Assessment & Plan Note (Signed)
Patient has had MRI showing that there is a left-sided L4 nerve root impingement that could be contributing to some of the pain with some of the radicular symptoms that is consistent with this finding.  Patient would like to avoid any type of steroid injection in the back at the moment.  Attempted osteopathic manipulation.  Patient has not had as much improvement with the gabapentin and is trying to avoid a significant amount of different medications.  Discussed with patient again about icing regimen and home exercises.  I do feel with patient having continued hot flashes and other dysregulation as well as weight loss I do feel CT abdomen pelvis is worth mild at this moment.  Patient's arthritis is significantly worse than what is anticipated as well.  Patient is a active otherwise.  Patient is likely going to be having the placement of the contralateral shoulder in the near future as well.  We will need to continue to monitor.  Follow-up again in 5 weeks

## 2020-08-18 ENCOUNTER — Other Ambulatory Visit: Payer: Self-pay | Admitting: Orthopedic Surgery

## 2020-08-18 DIAGNOSIS — M25512 Pain in left shoulder: Secondary | ICD-10-CM

## 2020-08-26 ENCOUNTER — Ambulatory Visit: Payer: 59 | Attending: Orthopedic Surgery | Admitting: Physical Therapy

## 2020-08-26 ENCOUNTER — Other Ambulatory Visit: Payer: Self-pay

## 2020-08-26 DIAGNOSIS — M25611 Stiffness of right shoulder, not elsewhere classified: Secondary | ICD-10-CM | POA: Diagnosis not present

## 2020-08-26 DIAGNOSIS — M6281 Muscle weakness (generalized): Secondary | ICD-10-CM | POA: Insufficient documentation

## 2020-08-26 DIAGNOSIS — M5416 Radiculopathy, lumbar region: Secondary | ICD-10-CM | POA: Diagnosis not present

## 2020-08-26 DIAGNOSIS — M25511 Pain in right shoulder: Secondary | ICD-10-CM | POA: Diagnosis not present

## 2020-08-26 NOTE — Therapy (Signed)
[facility] [address] [place], [place], [address] Phone: [phone]   Fax:  [phone]  Physical Therapy Treatment  Patient Details  Name: Tara [name] MRN: [id number] Date of Birth: 12-30-1960 Referring Provider (PT): Dr. Onnie Graham   Encounter Date: 08/26/2020   PT End of Session - 08/26/20 0941     Visit Number 16    Date for PT Re-Evaluation 10/28/20    Authorization Type UMR    PT Start Time 0800    PT Stop Time 8676   DN, heat   PT Time Calculation (min) 58 min    Activity Tolerance Patient tolerated treatment well             Past Medical History:  Diagnosis Date   Anemia    Arthritis    Hyperlipidemia    Insomnia    Mitral valve prolapse    PONV (postoperative nausea and vomiting)     Past Surgical History:  Procedure Laterality Date   AUGMENTATION MAMMAPLASTY Bilateral    Dec 2019   BREAST BIOPSY Right    axillary node benign   KNEE SURGERY  1988   right   SHOULDER ARTHROSCOPY WITH LABRAL REPAIR Right 08/28/2012   Procedure: RIGHT SHOULDER ARTHROSCOPY WITH LABRAL DEBRIDEMENT, ASPIRATION AND DECOMPRESSION OF PARALABRAL CYST;  Surgeon: Marin Shutter, MD;  Location: [facility];  Service: Orthopedics;  Laterality: Right;   TOTAL SHOULDER ARTHROPLASTY Right 03/24/2020   Procedure: RIGHT REVERSE SHOULDER ARTHROPLASTY;  Surgeon: Justice Britain, MD;  Location: [facility];  Service: Orthopedics;  Laterality: Right;  161min    There were no vitals filed for this visit.   Subjective Assessment - 08/26/20 0801     Subjective I'm about the same.   Dr. Tamala Julian recommended a CT of abdomen to rule out some things.  I don't want to do the injection b/c of the harmful effects.  The leg is better than when I started.   Shoulder is coming along except for internal and external rotation.  Started to run on Saturday and felt it in buttock for a 10K it was painful.  Sitting in the car this morning it was a 7/10 in my  calf.  I think  the DN is helping the most.    Pertinent History right reverse total shoulder replacement;  03/24/20;  right knee OA    Diagnostic tests nerve root impingement    Patient Stated Goals do my hair; lift in my cabinets; out of pain (dr thinks I'll be fully functional)    Currently in Pain? Yes    Pain Score 7     Pain Location Calf    Pain Orientation Left    Pain Score 0    Pain Location Shoulder    Pain Orientation Right                [facility] PT Assessment - 08/26/20 0001       AROM   Right Shoulder Flexion 145 Degrees    Right Shoulder ABduction 120 Degrees                           [facility] Adult PT Treatment/Exercise - 08/26/20 0001       Lumbar Exercises: Standing   Other Standing Lumbar Exercises discussion of symptoms, effect of home exercise and physiology of healing      Moist Heat Therapy   Number Minutes Moist Heat 10 Minutes  Moist Heat Location Shoulder;Hip;Knee      Acupuncturist Location med and lateral HS/calf    Electrical Stimulation Action pre mod    Electrical Stimulation Parameters with DN 10 min    Electrical Stimulation Goals Pain      Manual Therapy   Manual therapy comments soft tissue mob of right upper trap, supraspinatus, teres, subscapularis, infraspinatus    Soft tissue mobilization med/lateral HS; lateral/medial gastroc              Trigger Point Dry Needling - 08/26/20 0001     Consent Given? Yes    Muscles Treated Upper Quadrant Supraspinatus;Infraspinatus;Subscapularis;Teres major;Pectoralis major;Pectoralis minor    Muscles Treated Lower Quadrant Hamstring    Electrical Stimulation Performed with Dry Needling Yes    Pectoralis Major Response Palpable increased muscle length    Pectoralis Minor Response Palpable increased muscle length    Infraspinatus Response Palpable increased muscle length    Subscapularis Response Palpable increased muscle length    Teres  major Response Palpable increased muscle length    Vastus lateralis Response Palpable increased muscle length;Twitch response elicited    Hamstring Response Palpable increased muscle length    Gastrocnemius Response Twitch response elicited;Palpable increased muscle length                    PT Short Term Goals - 08/05/20 1441       PT SHORT TERM GOAL #1   Title The patient will have 120 degrees flexion ROM needed for reaching eye level for grooming/dressing    Status Achieved      PT SHORT TERM GOAL #2   Title The patient will report a 40% improvement in right shoulder pain with basic home and work ADLS and sleep    Status Achieved      PT SHORT TERM GOAL #3   Title Abduction/scaption elevation to 100 degrees needed for driving    Status Achieved      PT SHORT TERM GOAL #4   Title FOTO functional score will improve to 55%    Status Achieved      PT SHORT TERM GOAL #5   Title The patient will report a 25% improvement in left LE symptoms while sitting for driving and work duties    Status Achieved               PT Long Term Goals - 08/05/20 1442       PT LONG TERM GOAL #1   Title The patient will be independent in safe self progression of HEP    Time 12    Period Weeks    Status On-going    Target Date 10/28/20      PT LONG TERM GOAL #2   Title The patient will have 150 degrees of forward elevation needed for reaching into kitchen  cabinets    Time 12    Period Weeks    Status On-going      PT LONG TERM GOAL #3   Title The patient will have gross UE strength to lift dishes in the kitchen    Status Achieved      PT LONG TERM GOAL #4   Title The patient will be able to don/doff a pullover shirt and apply deodorant with minimal modifications and pain level <3/10    Time 12    Period Weeks    Status Revised      PT LONG TERM GOAL #5  Title FOTO score improved to 65%    Time 12    Period Weeks    Status On-going      PT LONG TERM GOAL #6    Title The patient will report a 40% improvement in LE symptoms with driving and sitting for work    Time 12    Period Weeks    Status Revised      PT LONG TERM GOAL #7   Title The patient will be able to sit for 5 minutes with minimal to no LE symptoms    Time 12    Period Weeks    Status New                   Plan - 08/26/20 5400     Clinical Impression Statement The patient demonstrates much improved shoulder flexion and abduction ROM with fluid quality of movement.  She remains diligent in working with internal and external rotation ROM at home using the wall and a strap.  Left LE symptoms still present with sitting in particular.  Painful with a 10K run last weekend but able to finish the race.  Her overall improvement has been slower but she does feel it is improving since start of care and feels the DN and ES have been the most helpful intervention.  She is highly compliant with tissue and mobility ex's at home.  Therapist monitoring response throughout treatment session.    Comorbidities multi region pain: degenerative changes left shoulder and right knee as well    Examination-Participation Restrictions Meal Prep;Cleaning;Occupation;Driving;Laundry    Rehab Potential Good    PT Frequency 1x / week    PT Duration 12 weeks    PT Treatment/Interventions ADLs/Self Care Home Management;Aquatic Therapy;Cryotherapy;Electrical Stimulation;Moist Heat;Neuromuscular re-education;Therapeutic exercise;Therapeutic activities;Patient/family education;Manual techniques;Dry needling;Taping;Traction    PT Next Visit Plan DN/ES to left LE and DN to scapular/shoulder muscles;  manual therapy to lumbo/pelvic/hip;   right shoulder ROM and progressive strengthening    PT Home Exercise Plan [id number]             Patient will benefit from skilled therapeutic intervention in order to improve the following deficits and impairments:  Decreased range of motion, Impaired UE functional use, Pain,  Impaired perceived functional ability  Visit Diagnosis: Acute pain of right shoulder  Radiculopathy, lumbar region  Muscle weakness (generalized)     Problem List Patient Active Problem List   Diagnosis Date Noted   Low back pain 02/26/2020   Nonallopathic lesion of sacral region 02/26/2020   Hot flashes 02/23/2019   Other insomnia 02/23/2019   Secondary osteoarthritis of left shoulder due to rotator cuff arthropathy 02/23/2019   Primary osteoarthritis involving multiple joints 02/23/2019   Cystic acne vulgaris 02/23/2019   Osteopenia 02/23/2019   Bunion of right foot 02/23/2019   Varicose 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 02/23/2019   Multiple atypical nevi 02/23/2019   Polyarthralgia 08/25/2014   Arthritis of right shoulder region 07/28/2014   Impingement syndrome of right shoulder 06/20/2010   Subacromial bursitis 06/20/2010   Rotator cuff tendonitis 06/20/2010   DYSPNEA 03/09/2010   Mitral valve prolapse 03/03/2009   HYPERTENSION, BENIGN 01/12/2009   PURE HYPERCHOLESTEROLEMIA 01/03/2009   MIGRAINE HEADACHE 01/03/2009   CHEST PAIN UNSPECIFIED 01/03/2009   ELEVATED BLOOD PRESSURE 01/03/2009   Ruben Im, PT 08/26/20 9:48 AM Phone: [phone] Fax: [phone]  Alvera Singh 08/26/2020, 9:48 AM  [facility] [address] [place],  [place], [address] Phone: [phone]   Fax:  [phone]  Name: Tara [name] MRN: [id number] Date of Birth: 1960/07/03

## 2020-09-02 ENCOUNTER — Other Ambulatory Visit: Payer: Self-pay

## 2020-09-02 ENCOUNTER — Ambulatory Visit: Payer: 59 | Admitting: Physical Therapy

## 2020-09-02 DIAGNOSIS — M25511 Pain in right shoulder: Secondary | ICD-10-CM

## 2020-09-02 DIAGNOSIS — M25611 Stiffness of right shoulder, not elsewhere classified: Secondary | ICD-10-CM | POA: Diagnosis not present

## 2020-09-02 DIAGNOSIS — M6281 Muscle weakness (generalized): Secondary | ICD-10-CM | POA: Diagnosis not present

## 2020-09-02 DIAGNOSIS — M5416 Radiculopathy, lumbar region: Secondary | ICD-10-CM

## 2020-09-02 NOTE — Therapy (Signed)
[facility] [address] [place], [place], [address] Phone: [phone]   Fax:  [phone]  Physical Therapy Treatment  Patient Details  Name: Tara [name] MRN: [id number] Date of Birth: 11-22-1960 Referring Provider (PT): Dr. Onnie Graham   Encounter Date: 09/02/2020   PT End of Session - 09/02/20 0840     Visit Number 17    Date for PT Re-Evaluation 10/28/20    Authorization Type UMR    PT Start Time 0755    PT Stop Time 0845    PT Time Calculation (min) 50 min    Activity Tolerance Patient tolerated treatment well             Past Medical History:  Diagnosis Date   Anemia    Arthritis    Hyperlipidemia    Insomnia    Mitral valve prolapse    PONV (postoperative nausea and vomiting)     Past Surgical History:  Procedure Laterality Date   AUGMENTATION MAMMAPLASTY Bilateral    Dec 2019   BREAST BIOPSY Right    axillary node benign   KNEE SURGERY  1988   right   SHOULDER ARTHROSCOPY WITH LABRAL REPAIR Right 08/28/2012   Procedure: RIGHT SHOULDER ARTHROSCOPY WITH LABRAL DEBRIDEMENT, ASPIRATION AND DECOMPRESSION OF PARALABRAL CYST;  Surgeon: Marin Shutter, MD;  Location: [facility];  Service: Orthopedics;  Laterality: Right;   TOTAL SHOULDER ARTHROPLASTY Right 03/24/2020   Procedure: RIGHT REVERSE SHOULDER ARTHROPLASTY;  Surgeon: Justice Britain, MD;  Location: [facility];  Service: Orthopedics;  Laterality: Right;  180min    There were no vitals filed for this visit.   Subjective Assessment - 09/02/20 0756     Subjective The leg is the same.  Buttock pain. I ran on Saturday, Tuesday and yesterday jog/walk.  It hurts but I did 6 miles, 4 miles, 3 yesterday.    Pertinent History right reverse total shoulder replacement;  03/24/20;  right knee OA    How long can you sit comfortably? 1 minute in and it will hurt    Currently in Pain? Yes    Pain Score 3     Pain Location Leg    Pain Orientation Left    Pain  Score 0    Pain Location Shoulder   no pain at rest                              [facility] Adult PT Treatment/Exercise - 09/02/20 0001       Moist Heat Therapy   Number Minutes Moist Heat 8 Minutes    Moist Heat Location Shoulder;Hip;Knee      Manual Therapy   Manual therapy comments soft tissue mob of right upper trap, supraspinatus, teres, subscapularis, infraspinatus    Soft tissue mobilization med/lateral HS; lateral/medial gastroc              Trigger Point Dry Needling - 09/02/20 0001     Consent Given? Yes    Muscles Treated Upper Quadrant Supraspinatus;Infraspinatus;Subscapularis;Teres major;Pectoralis major;Pectoralis minor    Muscles Treated Lower Quadrant Hamstring    Electrical Stimulation Performed with Dry Needling Yes    E-stim with Dry Needling Details L4-5 radicular pattern placement of ES    Pectoralis Major Response Palpable increased muscle length    Pectoralis Minor Response Palpable increased muscle length    Infraspinatus Response Palpable increased muscle length    Subscapularis Response Palpable increased muscle length  Teres major Response Palpable increased muscle length    Vastus lateralis Response Palpable increased muscle length;Twitch response elicited    Hamstring Response Palpable increased muscle length    Gastrocnemius Response Twitch response elicited;Palpable increased muscle length                    PT Short Term Goals - 08/05/20 1441       PT SHORT TERM GOAL #1   Title The patient will have 120 degrees flexion ROM needed for reaching eye level for grooming/dressing    Status Achieved      PT SHORT TERM GOAL #2   Title The patient will report a 40% improvement in right shoulder pain with basic home and work ADLS and sleep    Status Achieved      PT SHORT TERM GOAL #3   Title Abduction/scaption elevation to 100 degrees needed for driving    Status Achieved      PT SHORT TERM GOAL #4   Title FOTO  functional score will improve to 55%    Status Achieved      PT SHORT TERM GOAL #5   Title The patient will report a 25% improvement in left LE symptoms while sitting for driving and work duties    Status Achieved               PT Long Term Goals - 08/05/20 1442       PT LONG TERM GOAL #1   Title The patient will be independent in safe self progression of HEP    Time 12    Period Weeks    Status On-going    Target Date 10/28/20      PT LONG TERM GOAL #2   Title The patient will have 150 degrees of forward elevation needed for reaching into kitchen  cabinets    Time 12    Period Weeks    Status On-going      PT LONG TERM GOAL #3   Title The patient will have gross UE strength to lift dishes in the kitchen    Status Achieved      PT LONG TERM GOAL #4   Title The patient will be able to don/doff a pullover shirt and apply deodorant with minimal modifications and pain level <3/10    Time 12    Period Weeks    Status Revised      PT LONG TERM GOAL #5   Title FOTO score improved to 65%    Time 12    Period Weeks    Status On-going      PT LONG TERM GOAL #6   Title The patient will report a 40% improvement in LE symptoms with driving and sitting for work    Time 12    Period Weeks    Status Revised      PT LONG TERM GOAL #7   Title The patient will be able to sit for 5 minutes with minimal to no LE symptoms    Time 12    Period Weeks    Status New                   Plan - 09/02/20 0844     Clinical Impression Statement The patient continues to have LE symptoms particularly with sitting.  Symptoms also present with running but able to do 3-6 miles 3x/week.  Improving right shoulder overhead ROM except internal and external ROM still functionally  limited with dressing.  Her severe OA of left shoulder have required her to use her right shoulder more and this has become her "good side".  DN/ES used for modulation of neural symptoms and to decrease tender  points in piriformis, gluteal, HS and gastroc muscles.  DN and manual therapy helpful for glenohumeral and scapular muscle release.    Comorbidities multi region pain: degenerative changes left shoulder and right knee as well    Examination-Activity Limitations Bathing;Reach Overhead;Carry;Sleep;Dressing;Hygiene/Grooming;Toileting;Lift    Stability/Clinical Decision Making Stable/Uncomplicated    Rehab Potential Good    PT Duration 12 weeks    PT Treatment/Interventions ADLs/Self Care Home Management;Aquatic Therapy;Cryotherapy;Electrical Stimulation;Moist Heat;Neuromuscular re-education;Therapeutic exercise;Therapeutic activities;Patient/family education;Manual techniques;Dry needling;Taping;Traction    PT Next Visit Plan DN/ES to left LE L4-5  and DN to scapular/shoulder muscles;  manual therapy to lumbo/pelvic/hip;   right shoulder ROM and progressive strengthening    PT Home Exercise Plan [id number]             Patient will benefit from skilled therapeutic intervention in order to improve the following deficits and impairments:  Decreased range of motion, Impaired UE functional use, Pain, Impaired perceived functional ability  Visit Diagnosis: Acute pain of right shoulder  Radiculopathy, lumbar region  Muscle weakness (generalized)  Stiffness of right shoulder, not elsewhere classified     Problem List Patient Active Problem List   Diagnosis Date Noted   Low back pain 02/26/2020   Nonallopathic lesion of sacral region 02/26/2020   Hot flashes 02/23/2019   Other insomnia 02/23/2019   Secondary osteoarthritis of left shoulder due to rotator cuff arthropathy 02/23/2019   Primary osteoarthritis involving multiple joints 02/23/2019   Cystic acne vulgaris 02/23/2019   Osteopenia 02/23/2019   Bunion of right foot 02/23/2019   Varicose 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 02/23/2019   Multiple atypical nevi 02/23/2019   Polyarthralgia 08/25/2014   Arthritis of  right shoulder region 07/28/2014   Impingement syndrome of right shoulder 06/20/2010   Subacromial bursitis 06/20/2010   Rotator cuff tendonitis 06/20/2010   DYSPNEA 03/09/2010   Mitral valve prolapse 03/03/2009   HYPERTENSION, BENIGN 01/12/2009   PURE HYPERCHOLESTEROLEMIA 01/03/2009   MIGRAINE HEADACHE 01/03/2009   CHEST PAIN UNSPECIFIED 01/03/2009   ELEVATED BLOOD PRESSURE 01/03/2009   Ruben Im, PT 09/02/20 9:15 AM Phone: [phone] Fax: [phone]  Alvera Singh 09/02/2020, 9:15 AM  [facility] [address] [place], [place], [address] Phone: [phone]   Fax:  [phone]  Name: Tara [name] MRN: [id number] Date of Birth: [birth year]

## 2020-09-09 ENCOUNTER — Ambulatory Visit: Payer: 59 | Admitting: Physical Therapy

## 2020-09-13 ENCOUNTER — Encounter: Payer: Self-pay | Admitting: Podiatry

## 2020-09-13 ENCOUNTER — Other Ambulatory Visit (HOSPITAL_COMMUNITY): Payer: Self-pay

## 2020-09-14 ENCOUNTER — Other Ambulatory Visit: Payer: Self-pay | Admitting: Podiatry

## 2020-09-14 ENCOUNTER — Other Ambulatory Visit (HOSPITAL_COMMUNITY): Payer: Self-pay

## 2020-09-14 MED ORDER — EFINACONAZOLE 10 % EX SOLN
1.0000 [drp] | Freq: Every day | CUTANEOUS | 11 refills | Status: DC
  Filled 2020-09-14: qty 4, 80d supply, fill #0
  Filled 2020-09-14: qty 4, 90d supply, fill #0
  Filled 2020-10-13 – 2020-12-02 (×3): qty 4, 30d supply, fill #0

## 2020-09-16 ENCOUNTER — Other Ambulatory Visit: Payer: Self-pay

## 2020-09-16 ENCOUNTER — Ambulatory Visit: Payer: 59 | Admitting: Physical Therapy

## 2020-09-16 ENCOUNTER — Ambulatory Visit
Admission: RE | Admit: 2020-09-16 | Discharge: 2020-09-16 | Disposition: A | Discharge status: Home / Self Care | Payer: 59 | Source: Ambulatory Visit | Attending: Family Medicine | Admitting: Family Medicine

## 2020-09-16 ENCOUNTER — Ambulatory Visit
Admission: RE | Admit: 2020-09-16 | Discharge: 2020-09-16 | Disposition: A | Discharge status: Home / Self Care | Payer: 59 | Source: Ambulatory Visit | Attending: Orthopedic Surgery | Admitting: Orthopedic Surgery

## 2020-09-16 DIAGNOSIS — R531 Weakness: Secondary | ICD-10-CM | POA: Diagnosis not present

## 2020-09-16 DIAGNOSIS — R188 Other ascites: Secondary | ICD-10-CM | POA: Diagnosis not present

## 2020-09-16 DIAGNOSIS — M25412 Effusion, left shoulder: Secondary | ICD-10-CM | POA: Diagnosis not present

## 2020-09-16 DIAGNOSIS — M25611 Stiffness of right shoulder, not elsewhere classified: Secondary | ICD-10-CM

## 2020-09-16 DIAGNOSIS — M6281 Muscle weakness (generalized): Secondary | ICD-10-CM

## 2020-09-16 DIAGNOSIS — M19012 Primary osteoarthritis, left shoulder: Secondary | ICD-10-CM | POA: Diagnosis not present

## 2020-09-16 DIAGNOSIS — Z96611 Presence of right artificial shoulder joint: Secondary | ICD-10-CM | POA: Diagnosis not present

## 2020-09-16 DIAGNOSIS — R200 Anesthesia of skin: Secondary | ICD-10-CM | POA: Diagnosis not present

## 2020-09-16 DIAGNOSIS — M5416 Radiculopathy, lumbar region: Secondary | ICD-10-CM | POA: Diagnosis not present

## 2020-09-16 DIAGNOSIS — M25512 Pain in left shoulder: Secondary | ICD-10-CM

## 2020-09-16 DIAGNOSIS — R102 Pelvic and perineal pain: Secondary | ICD-10-CM | POA: Diagnosis not present

## 2020-09-16 DIAGNOSIS — R109 Unspecified abdominal pain: Secondary | ICD-10-CM

## 2020-09-16 DIAGNOSIS — M25511 Pain in right shoulder: Secondary | ICD-10-CM

## 2020-09-16 MED ORDER — IOPAMIDOL (ISOVUE-300) INJECTION 61%
80.0000 mL | Freq: Once | INTRAVENOUS | Status: AC | PRN
  Administered 2020-09-16: 80 mL via INTRAVENOUS

## 2020-09-16 NOTE — Therapy (Signed)
[facility] [address] [place], [place], [address] Phone: [phone]   Fax:  [phone]  Physical Therapy Treatment  Patient Details  Name: Tara [name] MRN: [id number] Date of Birth: 04-Oct-1960 Referring Provider (PT): Dr. Onnie Graham   Encounter Date: 09/16/2020   PT End of Session - 09/16/20 0953     Visit Number 18    Date for PT Re-Evaluation 10/28/20    Authorization Type UMR    PT Start Time 0755    PT Stop Time 0850   DN heat   PT Time Calculation (min) 55 min    Activity Tolerance Patient tolerated treatment well             Past Medical History:  Diagnosis Date   Anemia    Arthritis    Hyperlipidemia    Insomnia    Mitral valve prolapse    PONV (postoperative nausea and vomiting)     Past Surgical History:  Procedure Laterality Date   AUGMENTATION MAMMAPLASTY Bilateral    Dec 2019   BREAST BIOPSY Right    axillary node benign   KNEE SURGERY  1988   right   SHOULDER ARTHROSCOPY WITH LABRAL REPAIR Right 08/28/2012   Procedure: RIGHT SHOULDER ARTHROSCOPY WITH LABRAL DEBRIDEMENT, ASPIRATION AND DECOMPRESSION OF PARALABRAL CYST;  Surgeon: Marin Shutter, MD;  Location: [facility];  Service: Orthopedics;  Laterality: Right;   TOTAL SHOULDER ARTHROPLASTY Right 03/24/2020   Procedure: RIGHT REVERSE SHOULDER ARTHROPLASTY;  Surgeon: Justice Britain, MD;  Location: [facility];  Service: Orthopedics;  Laterality: Right;  142mn    There were no vitals filed for this visit.   Subjective Assessment - 09/16/20 0755     Subjective Ran Saturday.  Walked 2 days Tues and Thursday.  Swelling lateral post lateral knee when traveling to [place]    Pertinent History right reverse total shoulder replacement;  03/24/20;  right knee OA    Currently in Pain? Yes    Pain Score 3                 [facility] PT Assessment - 09/16/20 0001       AROM   Right Shoulder Flexion 150 Degrees    Right Shoulder ABduction 130  Degrees    Right Shoulder Internal Rotation --   L1   Right Shoulder External Rotation 30 Degrees                           [facility] Adult PT Treatment/Exercise - 09/16/20 0001       Moist Heat Therapy   Number Minutes Moist Heat 10 Minutes    Moist Heat Location Shoulder;Hip;Knee      Electrical Stimulation   Electrical Stimulation Location left lumbar multifidi; left lateral thigh and calf    Electrical Stimulation Action pre-mod    Electrical Stimulation Parameters with DN 15 min    Electrical Stimulation Goals Pain      Manual Therapy   Manual therapy comments soft tissue mob of right upper trap, supraspinatus, teres, subscapularis, infraspinatus    Soft tissue mobilization med/lateral HS; lateral/medial gastroc              Trigger Point Dry Needling - 09/16/20 0001     Consent Given? Yes    Muscles Treated Upper Quadrant Supraspinatus;Infraspinatus;Subscapularis;Teres major;Pectoralis major;Pectoralis minor    Muscles Treated Lower Quadrant Hamstring    Electrical Stimulation Performed with Dry Needling Yes  E-stim with Dry Needling Details L4-5 radicular pattern placement of ES    Pectoralis Major Response Palpable increased muscle length    Pectoralis Minor Response Palpable increased muscle length    Infraspinatus Response Palpable increased muscle length    Subscapularis Response Palpable increased muscle length    Teres major Response Palpable increased muscle length    Vastus lateralis Response Palpable increased muscle length;Twitch response elicited    Hamstring Response Palpable increased muscle length    Gastrocnemius Response Twitch response elicited;Palpable increased muscle length                    PT Short Term Goals - 08/05/20 1441       PT SHORT TERM GOAL #1   Title The patient will have 120 degrees flexion ROM needed for reaching eye level for grooming/dressing    Status Achieved      PT SHORT TERM GOAL #2   Title  The patient will report a 40% improvement in right shoulder pain with basic home and work ADLS and sleep    Status Achieved      PT SHORT TERM GOAL #3   Title Abduction/scaption elevation to 100 degrees needed for driving    Status Achieved      PT SHORT TERM GOAL #4   Title FOTO functional score will improve to 55%    Status Achieved      PT SHORT TERM GOAL #5   Title The patient will report a 25% improvement in left LE symptoms while sitting for driving and work duties    Status Achieved               PT Long Term Goals - 08/05/20 1442       PT LONG TERM GOAL #1   Title The patient will be independent in safe self progression of HEP    Time 12    Period Weeks    Status On-going    Target Date 10/28/20      PT LONG TERM GOAL #2   Title The patient will have 150 degrees of forward elevation needed for reaching into kitchen  cabinets    Time 12    Period Weeks    Status On-going      PT LONG TERM GOAL #3   Title The patient will have gross UE strength to lift dishes in the kitchen    Status Achieved      PT LONG TERM GOAL #4   Title The patient will be able to don/doff a pullover shirt and apply deodorant with minimal modifications and pain level <3/10    Time 12    Period Weeks    Status Revised      PT LONG TERM GOAL #5   Title FOTO score improved to 65%    Time 12    Period Weeks    Status On-going      PT LONG TERM GOAL #6   Title The patient will report a 40% improvement in LE symptoms with driving and sitting for work    Time 12    Period Weeks    Status Revised      PT LONG TERM GOAL #7   Title The patient will be able to sit for 5 minutes with minimal to no LE symptoms    Time 12    Period Weeks    Status New  Plan - 09/16/20 0953     Clinical Impression Statement Right shoulder flexion, abduction and external rotation ROM steadily improving.  Noted middle deltoid muscle mass size > than her non-surgical side.   Decreased tender points in glutes and right glenohumeral/scapular muscles.  Left LE symptoms remain.  She reports physical activity does not worsen so she continues to run and walk despite it's presence.  Had post-lateral edema the week she traveled and wasn't as active.  Minimal edema present today.  Therapist monitoring response throughout session.    Comorbidities multi region pain: degenerative changes left shoulder and right knee as well    Examination-Participation Restrictions Meal Prep;Cleaning;Occupation;Driving;Laundry    Stability/Clinical Decision Making Stable/Uncomplicated    Rehab Potential Good    PT Frequency 1x / week    PT Duration 12 weeks    PT Treatment/Interventions ADLs/Self Care Home Management;Aquatic Therapy;Cryotherapy;Electrical Stimulation;Moist Heat;Neuromuscular re-education;Therapeutic exercise;Therapeutic activities;Patient/family education;Manual techniques;Dry needling;Taping;Traction    PT Next Visit Plan DN/ES to left LE L4-5  and DN to scapular/shoulder muscles;  manual therapy to lumbo/pelvic/hip;   right shoulder ROM and progressive strengthening    PT Home Exercise Plan [id number]             Patient will benefit from skilled therapeutic intervention in order to improve the following deficits and impairments:  Decreased range of motion, Impaired UE functional use, Pain, Impaired perceived functional ability  Visit Diagnosis: Acute pain of right shoulder  Radiculopathy, lumbar region  Muscle weakness (generalized)  Stiffness of right shoulder, not elsewhere classified     Problem List Patient Active Problem List   Diagnosis Date Noted   Low back pain 02/26/2020   Nonallopathic lesion of sacral region 02/26/2020   Hot flashes 02/23/2019   Other insomnia 02/23/2019   Secondary osteoarthritis of left shoulder due to rotator cuff arthropathy 02/23/2019   Primary osteoarthritis involving multiple joints 02/23/2019   Cystic acne vulgaris  02/23/2019   Osteopenia 02/23/2019   Bunion of right foot 02/23/2019   Varicose 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 02/23/2019   Multiple atypical nevi 02/23/2019   Polyarthralgia 08/25/2014   Arthritis of right shoulder region 07/28/2014   Impingement syndrome of right shoulder 06/20/2010   Subacromial bursitis 06/20/2010   Rotator cuff tendonitis 06/20/2010   DYSPNEA 03/09/2010   Mitral valve prolapse 03/03/2009   HYPERTENSION, BENIGN 01/12/2009   PURE HYPERCHOLESTEROLEMIA 01/03/2009   MIGRAINE HEADACHE 01/03/2009   CHEST PAIN UNSPECIFIED 01/03/2009   ELEVATED BLOOD PRESSURE 01/03/2009   Ruben Im, PT 09/16/20 9:59 AM Phone: [phone] Fax: [phone]  Alvera Singh 09/16/2020, 9:59 AM  [facility] [address] [place], [place], [address] Phone: [phone]   Fax:  [phone]  Name: Tara [name] MRN: [id number] Date of Birth: Jul 06, 1960

## 2020-09-21 ENCOUNTER — Other Ambulatory Visit (HOSPITAL_COMMUNITY): Payer: Self-pay

## 2020-09-21 ENCOUNTER — Telehealth: Payer: Self-pay | Admitting: *Deleted

## 2020-09-21 NOTE — Telephone Encounter (Signed)
Called and left a message for the patient. Tara [name]

## 2020-09-23 ENCOUNTER — Ambulatory Visit: Payer: 59 | Admitting: Physical Therapy

## 2020-09-23 ENCOUNTER — Ambulatory Visit (INDEPENDENT_AMBULATORY_CARE_PROVIDER_SITE_OTHER): Payer: 59 | Admitting: Family Medicine

## 2020-09-23 ENCOUNTER — Other Ambulatory Visit: Payer: Self-pay

## 2020-09-23 ENCOUNTER — Encounter: Payer: Self-pay | Admitting: Family Medicine

## 2020-09-23 VITALS — BP 100/60 | HR 64 | Ht 70.0 in | Wt 142.0 lb

## 2020-09-23 DIAGNOSIS — M9901 Segmental and somatic dysfunction of cervical region: Secondary | ICD-10-CM

## 2020-09-23 DIAGNOSIS — M9902 Segmental and somatic dysfunction of thoracic region: Secondary | ICD-10-CM

## 2020-09-23 DIAGNOSIS — M9908 Segmental and somatic dysfunction of rib cage: Secondary | ICD-10-CM | POA: Diagnosis not present

## 2020-09-23 DIAGNOSIS — M5442 Lumbago with sciatica, left side: Secondary | ICD-10-CM

## 2020-09-23 DIAGNOSIS — G8929 Other chronic pain: Secondary | ICD-10-CM

## 2020-09-23 DIAGNOSIS — M9903 Segmental and somatic dysfunction of lumbar region: Secondary | ICD-10-CM | POA: Diagnosis not present

## 2020-09-23 DIAGNOSIS — M9904 Segmental and somatic dysfunction of sacral region: Secondary | ICD-10-CM

## 2020-09-23 NOTE — Progress Notes (Signed)
[facility] Phone: [phone] Subjective:   Rito Ehrlich, am serving as a scribe for Dr. Hulan Saas.  I'm seeing this patient by the request  of:  [name], [name], NP  CC: Follow-up for pain in the lower back.  [id number]  Tara [name] is a 60 y.o. female coming in with complaint of  R shoulder and back pain. OMT last visit. Patient states that she is not doing any better and on Saturday she was in tears from the pain in her lower L back to hip area. Did not feel like she had relief from OMT. Right shoulder is doing great, but halving pain in left shoulder.  Patient does not want to have the surgery if she can help.  Did have an MRI that was independently visualized.  Patient with significant arthritic changes.   Patient is continuing to work with physical therapy for the pain in the right shoulder and muscle weakness. Have attempted osteopathic manipulation in the past. Patient has discontinued most of her medications at this time.     Past Medical History:  Diagnosis Date   Anemia    Arthritis    Hyperlipidemia    Insomnia    Mitral valve prolapse    PONV (postoperative nausea and vomiting)    Past Surgical History:  Procedure Laterality Date   AUGMENTATION MAMMAPLASTY Bilateral    Dec 2019   BREAST BIOPSY Right    axillary node benign   KNEE SURGERY  1988   right   SHOULDER ARTHROSCOPY WITH LABRAL REPAIR Right 08/28/2012   Procedure: RIGHT SHOULDER ARTHROSCOPY WITH LABRAL DEBRIDEMENT, ASPIRATION AND DECOMPRESSION OF PARALABRAL CYST;  Surgeon: Marin Shutter, MD;  Location: [facility];  Service: Orthopedics;  Laterality: Right;   TOTAL SHOULDER ARTHROPLASTY Right 03/24/2020   Procedure: RIGHT REVERSE SHOULDER ARTHROPLASTY;  Surgeon: Justice Britain, MD;  Location: [facility];  Service: Orthopedics;  Laterality: Right;  149mn   Social History   Socioeconomic History   Marital status: Married     Spouse name: Not on file   Number of children: Not on file   Years of education: Not on file   Highest education level: Not on file  Occupational History   Not on file  Tobacco Use   Smoking status: Never   Smokeless tobacco: Never  Vaping Use   Vaping Use: Never used  Substance and Sexual Activity   Alcohol use: No   Drug use: No   Sexual activity: Not on file  Other Topics Concern   Not on file  Social History Narrative   Tobacco use, amount per day now: 0   Past tobacco use, amount per day: 0   How many years did you use tobacco: 0   Alcohol use (drinks per week): 0   Diet: Heart Healthy/DASH Diet/ Lean and Clean type foods.   Do you drink/eat things with caffeine: 1 glass of Tea in the morning.   Marital status: Married                                 What year were you married? 1987   Do you live in a house, apartment, assisted living, condo, trailer, etc.? House   Is it one or more stories? Yes   How many persons live in your home? 2 ( Husband/ Self )   Do you have  pets in your home?( please list) Not currently, Dog Deceased.    Current or past profession: PhD/RN   Do you exercise?    Yes                              Type and how often? 6 Days a week.   Do you have a living will? Yes   Do you have a DNR form?   No                               If not, do you want to discuss one? Yes   Do you have signed POA/HPOA forms? Yes                       If so, please bring to you appointment   Social Determinants of Health   Financial Resource Strain: Not on file  Food Insecurity: Not on file  Transportation Needs: Not on file  Physical Activity: Not on file  Stress: Not on file  Social Connections: Not on file   Allergies  Allergen Reactions   Codeine Nausea And Vomiting   Hydrocodone Nausea And Vomiting   Triple Antibiotic Pain Relief [Neomy-Bacit-Polymyx-Pramoxine] Other (See Comments)    Burn   Triple Antibiotic W/Hydrocortisone  [Bacitra-Neomycin-Polymyxin-Hc]     Contrast Media [Iodinated Diagnostic Agents] Hives    After CT scan with contrast, pt had MRI without contrast. Pt informed MRI Tech that she had a rash on her legs. MRI Tech spoke with Radiologist after MRI was completed. Per Rad, since patient was here x 30 minutes after CT contrast and was doing fine, with rash fading, pt was allowed to leave facility. Alert and oriented. jmh   Family History  Problem Relation Age of Onset   Stroke Mother    Sudden death Neg Hx    Hypertension Neg Hx    Hyperlipidemia Neg Hx    Heart attack Neg Hx    Diabetes Neg Hx       Current Outpatient Medications (Respiratory):    cetirizine (ZYRTEC) 10 MG tablet, Take 10 mg by mouth daily as needed for allergies or rhinitis.   fluticasone (FLONASE) 50 MCG/ACT nasal spray, Place 1 spray into both nostrils 3 (three) times daily as needed for rhinitis or allergies.    Current Outpatient Medications (Other):    CALCIUM CITRATE PO, Take 400 mg by mouth 3 (three) times daily. Number varies each day based on calcium ingested in food   Cholecalciferol (VITAMIN D3) 50 MCG (2000 UT) TABS, Take 1 tablet by mouth daily.   Efinaconazole 10 % SOLN, Apply 1 drop topically daily.   Fluocin-Hydroquinone-Tretinoin (TRI-LUMA EX), Apply 1 application topically at bedtime.   Multiple Vitamin (MULTIVITAMIN WITH MINERALS) TABS, Take 1 tablet by mouth daily.   Omega-3 Fatty Acids (FISH OIL) 1000 MG CAPS, Take 1,000 mg by mouth 2 (two) times daily. Vit d3 2000 IU   OVER THE COUNTER MEDICATION, Take 2 tablets by mouth at bedtime. Formula 303  supplement for pain and rest   OVER THE COUNTER MEDICATION, Take 600 mg by mouth 2 (two) times daily. Red yeast rice   Tart Cherry (TART CHERRY ULTRA) 1200 MG CAPS, daily.   tretinoin (RETIN-A) 0.05 % cream, Apply 1 application topically at bedtime.   Reviewed prior external information including notes and imaging from  primary care  provider As well as notes that were available from care  everywhere and other healthcare systems.  Past medical history, social, surgical and family history all reviewed in electronic medical record.  No pertanent information unless stated regarding to the chief complaint.   Review of Systems:  No headache, visual changes, nausea, vomiting, diarrhea, constipation, dizziness, abdominal pain, skin rash, fevers, chills, night sweats, weight loss, swollen lymph nodes,  joint swelling, chest pain, shortness of breath, mood changes. POSITIVE muscle aches, body aches  Objective  Blood pressure 100/60, pulse 64, height '5\' 10"'$  (1.778 m), weight 142 lb (64.4 kg), last menstrual period 08/14/2012, SpO2 98 %.   General: No apparent distress alert and oriented x3 mood and affect normal, dressed appropriately.  HEENT: Pupils equal, extraocular movements intact  Respiratory: Patient's speak in full sentences and does not appear short of breath  Cardiovascular: No lower extremity edema, non tender, no erythema  Gait normal with good balance and coordination.  MSK: Patient does have tenderness noted.  Severe arthritic changes of the left shoulder noted. Low back exam does show significant tenderness medial thoracolumbar juncture down to the sacroiliac joint seems to be right sided at the sacrum but left-sided on the lower back.  The patient does have tightness noted with straight leg test on the left side.  Osteopathic findings C7 flexed rotated and side bent left T5 extended rotated and side bent right patient has been L4 flexed rotated and side bent left Sacrum right on right    Impression and Recommendations:     The above documentation has been reviewed and is accurate and complete Lyndal Pulley, DO

## 2020-09-23 NOTE — Patient Instructions (Addendum)
Good to see you  Read about radio frequency ablation  Bone density at 1 year and lets consider endocrinology Continue everything at the moment Lets see if manipulation works this time  See me again in 6-8 weeks

## 2020-09-23 NOTE — Assessment & Plan Note (Signed)
MRI does show that there is a left-sided L4 nerve root impingement that could be contributing to a significant amount of her pain.  We discussed the potential of nerve root injections as well as her abdomen and potential candidate for radiofrequency ablation depending on how she reacts.  Discussed with patient about icing regimen and home exercises and the possible referral to interventional radiology.  Patient would like to consider it.  Attempted musculoskeletal does have moderate low amplitude manipulation today.  We will see if patient get some improvement.  Follow-up again 6 weeks

## 2020-09-28 ENCOUNTER — Other Ambulatory Visit (HOSPITAL_COMMUNITY): Payer: Self-pay

## 2020-09-30 ENCOUNTER — Other Ambulatory Visit: Payer: Self-pay

## 2020-09-30 ENCOUNTER — Ambulatory Visit: Payer: 59 | Attending: Orthopedic Surgery | Admitting: Physical Therapy

## 2020-09-30 DIAGNOSIS — M25611 Stiffness of right shoulder, not elsewhere classified: Secondary | ICD-10-CM | POA: Insufficient documentation

## 2020-09-30 DIAGNOSIS — M25511 Pain in right shoulder: Secondary | ICD-10-CM | POA: Diagnosis not present

## 2020-09-30 DIAGNOSIS — M6281 Muscle weakness (generalized): Secondary | ICD-10-CM | POA: Diagnosis not present

## 2020-09-30 DIAGNOSIS — M5416 Radiculopathy, lumbar region: Secondary | ICD-10-CM | POA: Insufficient documentation

## 2020-09-30 NOTE — Therapy (Signed)
[facility] [address], [place], [place], [address] Phone: [phone]   Fax:  [phone]  Physical Therapy Treatment  Patient Details  Name: Tara [name] MRN: [id number] Date of Birth: May 12, 1960 Referring Provider (PT): Dr. Onnie Graham   Encounter Date: 09/30/2020   PT End of Session - 09/30/20 1157     Visit Number 19    Date for PT Re-Evaluation 10/28/20    Authorization Type UMR    PT Start Time 1100    PT Stop Time 1200   DN heat   PT Time Calculation (min) 60 min    Activity Tolerance Patient tolerated treatment well             Past Medical History:  Diagnosis Date   Anemia    Arthritis    Hyperlipidemia    Insomnia    Mitral valve prolapse    PONV (postoperative nausea and vomiting)     Past Surgical History:  Procedure Laterality Date   AUGMENTATION MAMMAPLASTY Bilateral    Dec 2019   BREAST BIOPSY Right    axillary node benign   KNEE SURGERY  1988   right   SHOULDER ARTHROSCOPY WITH LABRAL REPAIR Right 08/28/2012   Procedure: RIGHT SHOULDER ARTHROSCOPY WITH LABRAL DEBRIDEMENT, ASPIRATION AND DECOMPRESSION OF PARALABRAL CYST;  Surgeon: Marin Shutter, MD;  Location: [place];  Service: Orthopedics;  Laterality: Right;   TOTAL SHOULDER ARTHROPLASTY Right 03/24/2020   Procedure: RIGHT REVERSE SHOULDER ARTHROPLASTY;  Surgeon: Justice Britain, MD;  Location: [facility];  Service: Orthopedics;  Laterality: Right;  121mn    There were no vitals filed for this visit.   Subjective Assessment - 09/30/20 1101     Subjective Had terrible pain after doing a 10K 2 weeks ago so I haven't run since.  Pelvic and abdominal scans were good.  Dr. STamala Julianhas recommended injections and ablation.  Bone Scan in 2 weeks to check on bone density. Right Shoulder is doing OK.  Left shoulder is an issue.    Pertinent History right reverse total shoulder replacement;  03/24/20;  right knee OA    Patient Stated Goals do my  hair; lift in my cabinets; out of pain (dr thinks I'll be fully functional)    Currently in Pain? Yes    Pain Score 5     Pain Location Leg    Pain Orientation Left    Pain Type Chronic pain                               [facility] Adult PT Treatment/Exercise - 09/30/20 0001       Moist Heat Therapy   Moist Heat Location Shoulder;Hip;Knee      Electrical Stimulation   Electrical Stimulation Location left lumbar multifidi; left lateral thigh and calf    Electrical Stimulation Goals Pain              Trigger Point Dry Needling - 09/30/20 0001     Consent Given? Yes    Muscles Treated Upper Quadrant Supraspinatus;Infraspinatus;Subscapularis;Teres major;Pectoralis major;Pectoralis minor    Muscles Treated Lower Quadrant Hamstring    Electrical Stimulation Performed with Dry Needling Yes    E-stim with Dry Needling Details L4-5 radicular pattern placement of ES    Pectoralis Major Response Palpable increased muscle length    Pectoralis Minor Response Palpable increased muscle length    Infraspinatus Response Palpable increased muscle length  Subscapularis Response Palpable increased muscle length    Teres major Response Palpable increased muscle length    Vastus lateralis Response Palpable increased muscle length;Twitch response elicited    Hamstring Response Palpable increased muscle length    Gastrocnemius Response Twitch response elicited;Palpable increased muscle length                    PT Short Term Goals - 08/05/20 1441       PT SHORT TERM GOAL #1   Title The patient will have 120 degrees flexion ROM needed for reaching eye level for grooming/dressing    Status Achieved      PT SHORT TERM GOAL #2   Title The patient will report a 40% improvement in right shoulder pain with basic home and work ADLS and sleep    Status Achieved      PT SHORT TERM GOAL #3   Title Abduction/scaption elevation to 100 degrees needed for driving    Status  Achieved      PT SHORT TERM GOAL #4   Title FOTO functional score will improve to 55%    Status Achieved      PT SHORT TERM GOAL #5   Title The patient will report a 25% improvement in left LE symptoms while sitting for driving and work duties    Status Achieved               PT Long Term Goals - 08/05/20 1442       PT LONG TERM GOAL #1   Title The patient will be independent in safe self progression of HEP    Time 12    Period Weeks    Status On-going    Target Date 10/28/20      PT LONG TERM GOAL #2   Title The patient will have 150 degrees of forward elevation needed for reaching into kitchen  cabinets    Time 12    Period Weeks    Status On-going      PT LONG TERM GOAL #3   Title The patient will have gross UE strength to lift dishes in the kitchen    Status Achieved      PT LONG TERM GOAL #4   Title The patient will be able to don/doff a pullover shirt and apply deodorant with minimal modifications and pain level <3/10    Time 12    Period Weeks    Status Revised      PT LONG TERM GOAL #5   Title FOTO score improved to 65%    Time 12    Period Weeks    Status On-going      PT LONG TERM GOAL #6   Title The patient will report a 40% improvement in LE symptoms with driving and sitting for work    Time 12    Period Weeks    Status Revised      PT LONG TERM GOAL #7   Title The patient will be able to sit for 5 minutes with minimal to no LE symptoms    Time 12    Period Weeks    Status New                   Plan - 09/30/20 1158     Clinical Impression Statement The patient has had an exacerbation of LE neural symptoms and has been more limited in sitting tolerance and unable to run.  She is considering her  medical options as discussed with Dr. Tamala Julian to determine the best path.  She feels PT treatment with DN and ES has helped with temporary relief but has not been a permanent solution of LE symptoms .   Surgical shoulder soft tissue mobility  has signficantly improved especially teres, subscapularis  and infraspinatus muscles.  She is highly compliant with her HEP for further improvements in shoulder ROM and strengthening progression.  Will continue to decrease treatment frequency.    Comorbidities multi region pain: degenerative changes left shoulder and right knee as well    Rehab Potential Good    PT Frequency 1x / week    PT Duration 12 weeks    PT Treatment/Interventions ADLs/Self Care Home Management;Aquatic Therapy;Cryotherapy;Electrical Stimulation;Moist Heat;Neuromuscular re-education;Therapeutic exercise;Therapeutic activities;Patient/family education;Manual techniques;Dry needling;Taping;Traction    PT Next Visit Plan DN/ES to left LE L4-5  and DN to scapular/shoulder muscles;  manual therapy to lumbo/pelvic/hip;  recheck shoulder  ROM;   right shoulder ROM and progressive strengthening    PT Home Exercise Plan [id number]             Patient will benefit from skilled therapeutic intervention in order to improve the following deficits and impairments:  Decreased range of motion, Impaired UE functional use, Pain, Impaired perceived functional ability  Visit Diagnosis: Acute pain of right shoulder  Radiculopathy, lumbar region  Muscle weakness (generalized)  Stiffness of right shoulder, not elsewhere classified     Problem List Patient Active Problem List   Diagnosis Date Noted   Low back pain 02/26/2020   Nonallopathic lesion of sacral region 02/26/2020   Hot flashes 02/23/2019   Other insomnia 02/23/2019   Secondary osteoarthritis of left shoulder due to rotator cuff arthropathy 02/23/2019   Primary osteoarthritis involving multiple joints 02/23/2019   Cystic acne vulgaris 02/23/2019   Osteopenia 02/23/2019   Bunion of right foot 02/23/2019   Varicose 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 02/23/2019   Multiple atypical nevi 02/23/2019   Polyarthralgia 08/25/2014   Arthritis of right shoulder  region 07/28/2014   Impingement syndrome of right shoulder 06/20/2010   Subacromial bursitis 06/20/2010   Rotator cuff tendonitis 06/20/2010   DYSPNEA 03/09/2010   Mitral valve prolapse 03/03/2009   HYPERTENSION, BENIGN 01/12/2009   PURE HYPERCHOLESTEROLEMIA 01/03/2009   MIGRAINE HEADACHE 01/03/2009   CHEST PAIN UNSPECIFIED 01/03/2009   ELEVATED BLOOD PRESSURE 01/03/2009   Ruben Im, PT 09/30/20 12:11 PM Phone: [phone] Fax: [phone]  Alvera Singh 09/30/2020, 12:11 PM  [facility] [address] [place], [place], [address] Phone: [phone]   Fax:  [phone]  Name: Tara [name] MRN: [id number] Date of Birth: February 11, 1961

## 2020-10-04 ENCOUNTER — Encounter: Payer: 59 | Admitting: Physical Therapy

## 2020-10-05 ENCOUNTER — Ambulatory Visit
Admission: RE | Admit: 2020-10-05 | Discharge: 2020-10-05 | Disposition: A | Discharge status: Home / Self Care | Payer: 59 | Source: Ambulatory Visit | Attending: Internal Medicine | Admitting: Internal Medicine

## 2020-10-05 ENCOUNTER — Other Ambulatory Visit: Payer: Self-pay

## 2020-10-05 DIAGNOSIS — Z1231 Encounter for screening mammogram for malignant neoplasm of breast: Secondary | ICD-10-CM | POA: Diagnosis not present

## 2020-10-05 DIAGNOSIS — M85852 Other specified disorders of bone density and structure, left thigh: Secondary | ICD-10-CM | POA: Diagnosis not present

## 2020-10-05 DIAGNOSIS — Z780 Asymptomatic menopausal state: Secondary | ICD-10-CM | POA: Diagnosis not present

## 2020-10-05 DIAGNOSIS — M8580 Other specified disorders of bone density and structure, unspecified site: Secondary | ICD-10-CM

## 2020-10-05 DIAGNOSIS — M810 Age-related osteoporosis without current pathological fracture: Secondary | ICD-10-CM | POA: Diagnosis not present

## 2020-10-06 ENCOUNTER — Encounter: Payer: Self-pay | Admitting: Family Medicine

## 2020-10-07 ENCOUNTER — Other Ambulatory Visit: Payer: Self-pay

## 2020-10-07 DIAGNOSIS — R937 Abnormal findings on diagnostic imaging of other parts of musculoskeletal system: Secondary | ICD-10-CM

## 2020-10-12 ENCOUNTER — Telehealth: Payer: Self-pay | Admitting: *Deleted

## 2020-10-12 NOTE — Telephone Encounter (Signed)
Called and left a message for the patient stating I was returning patient's call about the medicine for the toenails. Tara [name]

## 2020-10-13 ENCOUNTER — Other Ambulatory Visit (HOSPITAL_COMMUNITY): Payer: Self-pay

## 2020-10-14 ENCOUNTER — Encounter: Payer: 59 | Admitting: Physical Therapy

## 2020-10-17 ENCOUNTER — Other Ambulatory Visit (HOSPITAL_COMMUNITY): Payer: Self-pay

## 2020-10-17 NOTE — Telephone Encounter (Signed)
Forwarded to [place] due to [employer] out of office.

## 2020-10-20 ENCOUNTER — Other Ambulatory Visit: Payer: Self-pay

## 2020-10-20 ENCOUNTER — Other Ambulatory Visit (HOSPITAL_COMMUNITY): Payer: Self-pay

## 2020-10-20 DIAGNOSIS — G8929 Other chronic pain: Secondary | ICD-10-CM

## 2020-10-20 NOTE — Progress Notes (Unsigned)
Referral placed.

## 2020-10-26 NOTE — Progress Notes (Signed)
Tara [name] Phone: [phone] Subjective:   Tara [name], am serving as a scribe for Dr. Hulan Saas. This visit occurred during the SARS-CoV-2 public health emergency.  Safety protocols were in place, including screening questions prior to the visit, additional usage of staff PPE, and extensive cleaning of exam room while observing appropriate contact time as indicated for disinfecting solutions.   I'm seeing this patient by the request  of:  [name], [name], NP  CC: Low back pain follow-up  [id number]  Tara [name] is a 60 y.o. female coming in with complaint of back and neck pain. OMT on 09/23/2020. Had bone density test done on 10/05/2020. Showed osteoporosis. Patient states that she continues to have pain in L glute and hamstring. Worse with sitting. [name] change at all from last visit.  Patient has had the manipulation and does not know if she is making any significant improvement.  Has recently saw endocrinology and is being further worked up for the hypercalcemia as well as the osteoporosis.  Medications patient has been prescribed: None  Taking:         Past Medical History:  Diagnosis Date   Anemia    Arthritis    Hyperlipidemia    Insomnia    Mitral valve prolapse    PONV (postoperative nausea and vomiting)     Allergies  Allergen Reactions   Codeine Nausea And Vomiting   Hydrocodone Nausea And Vomiting   Triple Antibiotic Pain Relief [Neomy-Bacit-Polymyx-Pramoxine] Other (See Comments)    Burn   Triple Antibiotic W/Hydrocortisone  [Bacitra-Neomycin-Polymyxin-Hc]    Contrast Media [Iodinated Diagnostic Agents] Hives    After CT scan with contrast, pt had MRI without contrast. Pt informed MRI Tech that she had a rash on her legs. MRI Tech spoke with Radiologist after MRI was completed. Per Rad, since patient was here x 30 minutes after CT contrast and was doing fine, with rash fading,  pt was allowed to leave facility. Alert and oriented. jmh     Review of Systems:  [name] headache, visual changes, nausea, vomiting, diarrhea, constipation, dizziness, abdominal pain, skin rash, fevers, chills, night sweats, weight loss, swollen lymph nodes, chest pain, shortness of breath, mood changes. POSITIVE muscle aches, body aches, joint swelling  Objective  Blood pressure 106/76, pulse 60, height '5\' 10"'$  (1.778 m), weight 143 lb (64.9 kg), last menstrual period 08/14/2012, SpO2 97 %.   General: [name] apparent distress alert and oriented x3 mood and affect normal, dressed appropriately.  HEENT: Pupils equal, extraocular movements intact  Respiratory: Patient's speak in full sentences and does not appear short of breath  Cardiovascular: [name] lower extremity edema, non tender, [name] erythema  Patient does have increase in tightness noted with straight leg test on the left side.  This seems to be more in the radicular symptoms of the L4 nerve root.  Patient was severely tender even over the left hamstring tendon origin.  Significant arthritic changes of the shoulder noted on the left and replacement noted on the right  Osteopathic findings  C2 flexed rotated and side bent right C6 flexed rotated and side bent left C7 flexed rotated and side bent left T3 extended rotated and side bent right inhaled rib T9 extended rotated and side bent left L2 flexed rotated and side bent right L4 flexed rotated and side bent left Sacrum right on right       Assessment and Plan:  Low back pain  Patient continues to have back pain that seems to be at a proportion to the amount of findings.  Patient does have an L4 nerve root impingement and we are looking into the possibility of radiofrequency ablation.  The patient is going to be sent for further evaluation of this.  In addition to this does secondary to patient's severity of the osteoporosis patient could be a increased risk of a fracture.  I do feel a PE MRI  of the pelvis with and without contrast will be beneficial to further evaluate for any type of fracture that could be continuing to give patient the left leg pain while we continue to try further evaluate for the possible nerve impingement as well.     Nonallopathic problems  Decision today to treat with OMT was based on Physical Exam  After verbal consent patient was treated with HVLA, ME, FPR techniques in cervical, rib, thoracic, lumbar, and sacral  areas  Patient tolerated the procedure well with improvement in symptoms  Patient given exercises, stretches and lifestyle modifications  See medications in patient instructions if given  Patient will follow up in 4-8 weeks      The above documentation has been reviewed and is accurate and complete Lyndal Pulley, DO      Note: This dictation was prepared with Dragon dictation along with smaller phrase technology. Any transcriptional errors that result from this process are unintentional.

## 2020-10-28 ENCOUNTER — Other Ambulatory Visit: Payer: Self-pay

## 2020-10-28 ENCOUNTER — Encounter: Payer: Self-pay | Admitting: Family Medicine

## 2020-10-28 ENCOUNTER — Ambulatory Visit (INDEPENDENT_AMBULATORY_CARE_PROVIDER_SITE_OTHER): Payer: 59 | Admitting: Internal Medicine

## 2020-10-28 ENCOUNTER — Ambulatory Visit: Payer: 59 | Attending: Orthopedic Surgery | Admitting: Physical Therapy

## 2020-10-28 ENCOUNTER — Encounter: Payer: Self-pay | Admitting: Internal Medicine

## 2020-10-28 ENCOUNTER — Ambulatory Visit (INDEPENDENT_AMBULATORY_CARE_PROVIDER_SITE_OTHER): Payer: 59 | Admitting: Family Medicine

## 2020-10-28 VITALS — BP 106/76 | HR 60 | Ht 70.0 in | Wt 143.0 lb

## 2020-10-28 VITALS — BP 102/64 | HR 60 | Ht 70.0 in | Wt 141.0 lb

## 2020-10-28 DIAGNOSIS — M9902 Segmental and somatic dysfunction of thoracic region: Secondary | ICD-10-CM

## 2020-10-28 DIAGNOSIS — M25511 Pain in right shoulder: Secondary | ICD-10-CM | POA: Insufficient documentation

## 2020-10-28 DIAGNOSIS — G8929 Other chronic pain: Secondary | ICD-10-CM | POA: Diagnosis not present

## 2020-10-28 DIAGNOSIS — M9903 Segmental and somatic dysfunction of lumbar region: Secondary | ICD-10-CM | POA: Diagnosis not present

## 2020-10-28 DIAGNOSIS — M25611 Stiffness of right shoulder, not elsewhere classified: Secondary | ICD-10-CM | POA: Insufficient documentation

## 2020-10-28 DIAGNOSIS — M9904 Segmental and somatic dysfunction of sacral region: Secondary | ICD-10-CM | POA: Diagnosis not present

## 2020-10-28 DIAGNOSIS — M9908 Segmental and somatic dysfunction of rib cage: Secondary | ICD-10-CM

## 2020-10-28 DIAGNOSIS — M25552 Pain in left hip: Secondary | ICD-10-CM

## 2020-10-28 DIAGNOSIS — M6281 Muscle weakness (generalized): Secondary | ICD-10-CM | POA: Diagnosis not present

## 2020-10-28 DIAGNOSIS — M810 Age-related osteoporosis without current pathological fracture: Secondary | ICD-10-CM | POA: Diagnosis not present

## 2020-10-28 DIAGNOSIS — M5416 Radiculopathy, lumbar region: Secondary | ICD-10-CM | POA: Diagnosis not present

## 2020-10-28 DIAGNOSIS — M9901 Segmental and somatic dysfunction of cervical region: Secondary | ICD-10-CM

## 2020-10-28 DIAGNOSIS — M5442 Lumbago with sciatica, left side: Secondary | ICD-10-CM

## 2020-10-28 LAB — COMPREHENSIVE METABOLIC PANEL
ALT: 21 U/L (ref 0–35)
AST: 30 U/L (ref 0–37)
Albumin: 4.5 g/dL (ref 3.5–5.2)
Alkaline Phosphatase: 70 U/L (ref 39–117)
BUN: 17 mg/dL (ref 6–23)
CO2: 30 mEq/L (ref 19–32)
Calcium: 9.7 mg/dL (ref 8.4–10.5)
Chloride: 101 mEq/L (ref 96–112)
Creatinine, Ser: 0.69 mg/dL (ref 0.40–1.20)
GFR: 95.14 mL/min (ref >60.00)
Glucose, Bld: 89 mg/dL (ref 70–99)
Potassium: 4.3 mEq/L (ref 3.5–5.1)
Sodium: 137 mEq/L (ref 135–145)
Total Bilirubin: 0.5 mg/dL (ref 0.2–1.2)
Total Protein: 7.3 g/dL (ref 6.0–8.3)

## 2020-10-28 LAB — VITAMIN D 25 HYDROXY (VIT D DEFICIENCY, FRACTURES): VITD: 77.53 ng/mL (ref 30.00–100.00)

## 2020-10-28 LAB — MAGNESIUM: Magnesium: 2.1 mg/dL (ref 1.5–2.5)

## 2020-10-28 LAB — PHOSPHORUS: Phosphorus: 3.8 mg/dL (ref 2.3–4.6)

## 2020-10-28 LAB — TSH: TSH: 1.67 u[IU]/mL (ref 0.35–5.50)

## 2020-10-28 LAB — T4, FREE: Free T4: 0.7 ng/dL (ref 0.60–1.60)

## 2020-10-28 LAB — VITAMIN B12: Vitamin B-12: 653 pg/mL (ref 211–911)

## 2020-10-28 NOTE — Therapy (Signed)
[facility] [address], [place], [place], [address] Phone: [phone]   Fax:  [phone]  Physical Therapy Treatment/Discharge Summary   Patient Details  Name: Tara [name] MRN: [id number] Date of Birth: 1961-02-19 Referring Provider (PT): Dr. Onnie Graham   Encounter Date: 10/28/2020   PT End of Session - 10/28/20 1251     Visit Number 20    Date for PT Re-Evaluation 10/28/20    Authorization Type UMR    PT Start Time 0850    PT Stop Time 0930    PT Time Calculation (min) 40 min    Activity Tolerance Patient tolerated treatment well             Past Medical History:  Diagnosis Date   Anemia    Arthritis    Hyperlipidemia    Insomnia    Mitral valve prolapse    PONV (postoperative nausea and vomiting)     Past Surgical History:  Procedure Laterality Date   AUGMENTATION MAMMAPLASTY Bilateral    Dec 2019   BREAST BIOPSY Right    axillary node benign   KNEE SURGERY  1988   right   SHOULDER ARTHROSCOPY WITH LABRAL REPAIR Right 08/28/2012   Procedure: RIGHT SHOULDER ARTHROSCOPY WITH LABRAL DEBRIDEMENT, ASPIRATION AND DECOMPRESSION OF PARALABRAL CYST;  Surgeon: Marin Shutter, MD;  Location: [place];  Service: Orthopedics;  Laterality: Right;   TOTAL SHOULDER ARTHROPLASTY Right 03/24/2020   Procedure: RIGHT REVERSE SHOULDER ARTHROPLASTY;  Surgeon: Justice Britain, MD;  Location: [facility];  Service: Orthopedics;  Laterality: Right;  154mn    There were no vitals filed for this visit.   Subjective Assessment - 10/28/20 0850     Subjective Can we clear out [employer] HEP.  I need a strategy for how to handle acute pain when sitting.    Pertinent History right reverse total shoulder replacement;  03/24/20;  right knee OA                [facility] PT Assessment - 10/28/20 0001       Observation/Other Assessments   Focus on Therapeutic Outcomes (FOTO)  57%      AROM   Right Shoulder Flexion 150 Degrees     Right Shoulder ABduction 130 Degrees    Right Shoulder Internal Rotation --   L2   Right Shoulder External Rotation 20 Degrees      Strength   Overall Strength Comments grossly 4/5 glenohumeral and scapular muscles                           [facility] Adult PT Treatment/Exercise - 10/28/20 0001       Self-Care   Self-Care Other Self-Care Comments    Other Self-Care Comments  strategies for seated LE relief of symptoms      Lumbar Exercises: Seated   Other Seated Lumbar Exercises review of neural flossing and discussion of neural tensioning progression and expectations on symptom dissipation    Other Seated Lumbar Exercises seated self traction/decompression      Shoulder Exercises: Sidelying   Other Sidelying Exercises sleeper stretch for internal rotation      Shoulder Exercises: ROM/Strengthening   Other ROM/Strengthening Exercises extensive HEP update for focus areas: internal rotation, endurance for overhead activity                     PT Education - 10/28/20 1251     Education Details  HEP update for shoulder progression and LE symptom management    Person(s) Educated Patient    Methods Explanation;Demonstration;Handout    Comprehension Returned demonstration;Verbalized understanding              PT Short Term Goals - 10/28/20 1258       PT SHORT TERM GOAL #1   Title The patient will have 120 degrees flexion ROM needed for reaching eye level for grooming/dressing    Status Achieved      PT SHORT TERM GOAL #2   Title The patient will report a 40% improvement in right shoulder pain with basic home and work ADLS and sleep    Status Achieved      PT SHORT TERM GOAL #3   Title Abduction/scaption elevation to 100 degrees needed for driving    Status Achieved      PT SHORT TERM GOAL #4   Title FOTO functional score will improve to 55%    Status Achieved      PT SHORT TERM GOAL #5   Title The patient will report a 25% improvement in left  LE symptoms while sitting for driving and work duties    Status Achieved               PT Long Term Goals - 10/28/20 1258       PT LONG TERM GOAL #1   Title The patient will be independent in safe self progression of HEP    Status Achieved      PT LONG TERM GOAL #2   Title The patient will have 150 degrees of forward elevation needed for reaching into kitchen  cabinets    Time 12    Period Weeks    Status Partially Met      PT LONG TERM GOAL #3   Title The patient will have gross UE strength to lift dishes in the kitchen    Status Achieved      PT LONG TERM GOAL #4   Title The patient will be able to don/doff a pullover shirt and apply deodorant with minimal modifications and pain level <3/10    Status Partially Met      PT LONG TERM GOAL #5   Title FOTO score improved to 65%    Status Achieved      PT LONG TERM GOAL #6   Title The patient will report a 40% improvement in LE symptoms with driving and sitting for work    Status Not Met      PT LONG TERM GOAL #7   Title The patient will be able to sit for 5 minutes with minimal to no LE symptoms    Baseline variable    Status Not Met                   Plan - 10/28/20 1251     Clinical Impression Statement The patient has progressed well following right TSR although she has struggled with left LE neural symptoms especially with sitting for the duration of her post op recovery.  Her shoulder ROM is WFLs except limitations with internal rotation affect ease with dressing.  Her strength is grossly 4 to 4+/5 with some functional difficulty with sustained overhead activity.  We updated her HEP with these areas of focus.  Her shoulder FOTO functional outcome measure initially was 45% and has increased to 57%-65%.   We also discussed management of LE symptoms with seated self traction/decompression, weight shifting, neural floss  and tensioning.  She is working with Dr. Tamala Julian to determine whether a steroid injection  would be safe given her progressive osteoporosis.  Recommend discharge from PT at this time with the majority of goals met.             Patient will benefit from skilled therapeutic intervention in order to improve the following deficits and impairments:     Visit Diagnosis: Acute pain of right shoulder  Radiculopathy, lumbar region  Muscle weakness (generalized)  Stiffness of right shoulder, not elsewhere classified  PHYSICAL THERAPY DISCHARGE SUMMARY  Visits from Start of Care: 20  Current functional level related to goals / functional outcomes: See clinical impressions above   Remaining deficits: As above   Education / Equipment: HEP   Patient agrees to discharge. Patient goals were partially met. Patient is being discharged due to maximized rehab potential.     Problem List Patient Active Problem List   Diagnosis Date Noted   Osteoporosis without current pathological fracture 10/28/2020   Low back pain 02/26/2020   Nonallopathic lesion of sacral region 02/26/2020   Hot flashes 02/23/2019   Other insomnia 02/23/2019   Secondary osteoarthritis of left shoulder due to rotator cuff arthropathy 02/23/2019   Primary osteoarthritis involving multiple joints 02/23/2019   Cystic acne vulgaris 02/23/2019   Osteopenia 02/23/2019   Bunion of right foot 02/23/2019   Varicose 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 02/23/2019   Multiple atypical nevi 02/23/2019   Polyarthralgia 08/25/2014   Arthritis of right shoulder region 07/28/2014   Impingement syndrome of right shoulder 06/20/2010   Subacromial bursitis 06/20/2010   Rotator cuff tendonitis 06/20/2010   DYSPNEA 03/09/2010   Mitral valve prolapse 03/03/2009   HYPERTENSION, BENIGN 01/12/2009   PURE HYPERCHOLESTEROLEMIA 01/03/2009   MIGRAINE HEADACHE 01/03/2009   CHEST PAIN UNSPECIFIED 01/03/2009   ELEVATED BLOOD PRESSURE 01/03/2009   Ruben Im, PT 10/28/20 1:01 PM Phone: [phone] Fax:  [phone]  Alvera Singh, PT 10/28/2020, 1:00 PM   [facility] [address] [place], [place], [address] Phone: [phone]   Fax:  [phone]  Name: Tara [name] MRN: [id number] Date of Birth: 1960/09/02

## 2020-10-28 NOTE — Patient Instructions (Signed)
MRI Pelvis [phone] Tried OMT  We will see what endo comes up with See me in 6-8 weeks

## 2020-10-28 NOTE — Patient Instructions (Signed)

## 2020-10-28 NOTE — Progress Notes (Signed)
Name: Tara [name]  MRN/ DOB: [id number], 01/27/1961    Age/ Sex: 60 y.o., female    PCP: [name], Nelda Bucks, NP   Reason for Endocrinology Evaluation: Osteoporosis      Date of Initial Endocrinology Evaluation: 10/28/2020     HPI: [name] is a 60 y.o. female with a past medical history of Osteoporosis, somatic dysfunction of the spine  . The patient presented for initial endocrinology clinic visit on 10/28/2020 for consultative assistance with her Osteoporosis .   Pt was diagnosed with osteoporosis:2021  Menarche at age : 6 Menopausal at age : [age] Fracture Hx: no Hx of HRT: no FH of osteoporosis or hip fracture: mother  Prior Hx of anti-estrogenic therapy :no Prior Hx of anti-resorptive therapy : She was offered Fosamax in 2021 but opted with [place] program for 6 months   Calcium citrate 400 mg TID  Vitamin D3 2000 iu daily    No prior hx of glucocorticoids  Has nausea with hot flashes  Denies constipation     HISTORY:  Past Medical History:  Past Medical History:  Diagnosis Date   Anemia    Arthritis    Hyperlipidemia    Insomnia    Mitral valve prolapse    PONV (postoperative nausea and vomiting)    Past Surgical History:  Past Surgical History:  Procedure Laterality Date   AUGMENTATION MAMMAPLASTY Bilateral    Dec 2019   BREAST BIOPSY Right    axillary node benign   KNEE SURGERY  1988   right   SHOULDER ARTHROSCOPY WITH LABRAL REPAIR Right 08/28/2012   Procedure: RIGHT SHOULDER ARTHROSCOPY WITH LABRAL DEBRIDEMENT, ASPIRATION AND DECOMPRESSION OF PARALABRAL CYST;  Surgeon: Marin Shutter, MD;  Location: [place];  Service: Orthopedics;  Laterality: Right;   TOTAL SHOULDER ARTHROPLASTY Right 03/24/2020   Procedure: RIGHT REVERSE SHOULDER ARTHROPLASTY;  Surgeon: Justice Britain, MD;  Location: [facility];  Service: Orthopedics;  Laterality: Right;  16mn    Social History:  reports that she has never smoked. She has never used smokeless  tobacco. She reports that she does not drink alcohol and does not use drugs. Family History: family history includes Stroke in her mother.   HOME MEDICATIONS: Allergies as of 10/28/2020       Reactions   Codeine Nausea And Vomiting   Hydrocodone Nausea And Vomiting   Triple Antibiotic Pain Relief [neomy-bacit-polymyx-pramoxine] Other (See Comments)   Burn   Triple Antibiotic W/hydrocortisone  [bacitra-neomycin-polymyxin-hc]    Contrast Media [iodinated Diagnostic Agents] Hives   After CT scan with contrast, pt had MRI without contrast. Pt informed MRI Tech that she had a rash on her legs. MRI Tech spoke with Radiologist after MRI was completed. Per Rad, since patient was here x 30 minutes after CT contrast and was doing fine, with rash fading, pt was allowed to leave facility. Alert and oriented. jmh        Medication List        Accurate as of October 28, 2020 11:05 AM. If you have any questions, ask your nurse or doctor.          CALCIUM CITRATE PO Take 400 mg by mouth 3 (three) times daily. Number varies each day based on calcium ingested in food   cetirizine 10 MG tablet Commonly known as: ZYRTEC Take 10 mg by mouth daily as needed for allergies or rhinitis.   Efinaconazole 10 % Soln Apply 1 drop topically daily.   Fish Oil  1000 MG Caps Take 1,000 mg by mouth 2 (two) times daily. Vit d3 2000 IU   fluticasone 50 MCG/ACT nasal spray Commonly known as: FLONASE Place 1 spray into both nostrils 3 (three) times daily as needed for rhinitis or allergies.   multivitamin with minerals Tabs tablet Take 1 tablet by mouth daily.   OVER THE COUNTER MEDICATION Take 2 tablets by mouth at bedtime. Formula 303  supplement for pain and rest   OVER THE COUNTER MEDICATION Take 600 mg by mouth 2 (two) times daily. Red yeast rice   Tart Cherry Ultra 1200 MG Caps Generic drug: Tart Cherry daily.   tretinoin 0.05 % cream Commonly known as: RETIN-A Apply 1 application  topically at bedtime.   TRI-LUMA EX Apply 1 application topically at bedtime.   Vitamin D3 50 MCG (2000 UT) Tabs Take 1 tablet by mouth daily.          REVIEW OF SYSTEMS: A comprehensive ROS was conducted with the patient and is negative except as per HPI    OBJECTIVE:  VS: BP 102/64 (BP Location: Left Arm, Patient Position: Sitting, Cuff Size: Small)   Pulse 60   Ht '5\' 10"'$  (1.778 m)   Wt 141 lb (64 kg)   LMP 08/14/2012   SpO2 97%   BMI 20.23 kg/m    Wt Readings from Last 3 Encounters:  10/28/20 141 lb (64 kg)  09/23/20 142 lb (64.4 kg)  08/17/20 142 lb (64.4 kg)     EXAM: General: Pt appears well and is in NAD  Neck: General: Supple without adenopathy. Thyroid: Thyroid size normal.  No goiter or nodules appreciated.   Lungs: Clear with good BS bilat with no rales, rhonchi, or wheezes  Heart: Auscultation: RRR.  Abdomen: Normoactive bowel sounds, soft, nontender, without masses or organomegaly palpable  Extremities:  BL LE: No pretibial edema normal ROM and strength.  Skin: Hair: Texture and amount normal with gender appropriate distribution Skin Inspection: No rashes Skin Palpation: Skin temperature, texture, and thickness normal to palpation  Mental Status: Judgment, insight: Intact Orientation: Oriented to time, place, and person Mood and affect: No depression, anxiety, or agitation     DATA REVIEWED:   Results for Tara, [name] (MRN [id number]) as of 11/02/2020 12:58  Ref. Range 10/28/2020 11:59  Sodium Latest Ref Range: 135 - 145 mEq/L 137  Potassium Latest Ref Range: 3.5 - 5.1 mEq/L 4.3  Chloride Latest Ref Range: 96 - 112 mEq/L 101  CO2 Latest Ref Range: 19 - 32 mEq/L 30  Glucose Latest Ref Range: 70 - 99 mg/dL 89  BUN Latest Ref Range: 6 - 23 mg/dL 17  Creatinine Latest Ref Range: 0.40 - 1.20 mg/dL 0.69  Calcium Latest Ref Range: 8.4 - 10.5 mg/dL 9.7  Calcium Ionized Latest Ref Range: 4.8 - 5.6 mg/dL 4.96  Phosphorus Latest Ref Range: 2.3 -  4.6 mg/dL 3.8  Magnesium Latest Ref Range: 1.5 - 2.5 mg/dL 2.1  Alkaline Phosphatase Latest Ref Range: 39 - 117 U/L 70  Albumin Latest Ref Range: 3.5 - 5.2 g/dL 4.5  AST Latest Ref Range: 0 - 37 U/L 30  ALT Latest Ref Range: 0 - 35 U/L 21  Total Protein Latest Ref Range: 6.0 - 8.3 g/dL 7.3  Total Bilirubin Latest Ref Range: 0.2 - 1.2 mg/dL 0.5  GFR Latest Ref Range: >60.00 mL/min 95.14  VITD Latest Ref Range: 30.00 - 100.00 ng/mL 77.53  Vitamin B12 Latest Ref Range: 211 - 911 pg/mL 653  PTH,  Intact Latest Ref Range: 16 - 77 pg/mL 28  TSH Latest Ref Range: 0.35 - 5.50 uIU/mL 1.67  T4,Free(Direct) Latest Ref Range: 0.60 - 1.60 ng/dL 0.70  (tTG) Ab, IgA Latest Units: U/mL <1.0    DXA 10/05/2020 The BMD measured at AP Spine L1-L4 is 0.825 g/cm2 with a T-score of -3.0. This patient is considered osteoporotic according to [facility]) criteria.   The quality of the exam is good.   Site Region Measured Date Measured Age YA BMD Significant CHANGE T-score AP Spine L1-L4 10/05/2020 59.1 -3.0 0.825 g/cm2 * AP Spine  L1-L4      08/11/2019    57.9         -2.5    0.874 g/cm2   DualFemur Neck Left 10/05/2020 59.1 -2.3 0.716 g/cm2 * DualFemur Neck Left  08/11/2019    57.9         -1.9    0.772 g/cm2   DualFemur Total Mean 10/05/2020 59.1 -2.0 0.754 g/cm2 * DualFemur Total Mean 08/11/2019    57.9         -1.7    0.787 g/cm2   ASSESSMENT/PLAN/RECOMMENDATIONS:   Osteoporosis:  -Patient has family history of osteoporosis. -We will proceed with secondary causes for osteoporosis -We discussed general treatment to include PTH analogs (Forteo/Tymlos).  We also discussed Prolia as well as bisphosphonate - Of note the pt has hx of peptic ulcer and enteritis and is not a candidate for oral therapy -We did discuss the risk and the benefit of each class to include black box warning of bone cancer with PTH analogs. We discussed rare side effect of atypical fractures and  osteonecrosis.     Medications : Continue calcium citrate 400 mg 3 times daily Continue vitamin D 2000 IU daily Stop Prolia 60 mg [place] every 6 months   Follow-up in 6 months  Addendum:Discussed normal lab results with the pt (24-urine  pending) with the patient on 11/02/2020 at noon.  I have recommended proceeding with Prolia.  Patient in agreement  Signed electronically by: Mack Guise, MD  [facility] Endocrinology  [facility] [address] [place], [place] [address] Phone: [phone] FAX: [phone]   CC: Sandrea Hughs, NP [place] [address] Phone: [phone] Fax: [phone]   Return to Endocrinology clinic as below: Future Appointments  Date Time Provider [place]  10/28/2020  3:30 PM Lyndal Pulley, DO [facility] None  11/04/2020  3:15 PM [name], Nelda Bucks, NP [facility] None

## 2020-10-28 NOTE — Patient Instructions (Signed)
Access Code: [email or web address] URL: [email or web address] Date: 10/28/2020 Prepared by: Ruben Im  Exercises Standing Shoulder Internal Rotation AAROM with Pulley - 1 x daily - 7 x weekly - 3 sets - 10 reps Standing Shoulder Internal Rotation with Anchored Resistance - 1 x daily - 7 x weekly - 2 sets - 10 reps Shoulder External Rotation with Anchored Resistance - 1 x daily - 7 x weekly - 2 sets - 10 reps Standing Shoulder Alphabet - 1 x daily - 7 x weekly - 3 sets - 10 reps Standing Wall Ball Circles with Mini Swiss Ball - 1 x daily - 7 x weekly - 3 sets - 10 reps Standing Bilateral Shoulder Internal Rotation AAROM with Dowel - 1 x daily - 7 x weekly - 3 sets - 10 reps Standing Shoulder Internal Rotation Stretch with Dowel - 1 x daily - 7 x weekly - 3 sets - 10 reps Sleeper Stretch (Mirrored) - 1 x daily - 7 x weekly - 1 sets - 1-2 reps - 60 hold Sitting Lumbar Traction on Forearms - 1 x daily - 7 x weekly - 3 sets - 10 reps Self Traction in Standing with Counter Top - 1 x daily - 7 x weekly - 3 sets - 10 reps Seated Slump Nerve Glide - 1 x daily - 7 x weekly - 1 sets - 10 reps Seated Sciatic Tensioner - 1 x daily - 7 x weekly - 1 sets - 10 reps

## 2020-10-28 NOTE — Assessment & Plan Note (Signed)
Patient continues to have back pain that seems to be at a proportion to the amount of findings.  Patient does have an L4 nerve root impingement and we are looking into the possibility of radiofrequency ablation.  The patient is going to be sent for further evaluation of this.  In addition to this does secondary to patient's severity of the osteoporosis patient could be a increased risk of a fracture.  I do feel a PE MRI of the pelvis with and without contrast will be beneficial to further evaluate for any type of fracture that could be continuing to give patient the left leg pain while we continue to try further evaluate for the possible nerve impingement as well.

## 2020-10-31 ENCOUNTER — Other Ambulatory Visit: Payer: Self-pay

## 2020-10-31 ENCOUNTER — Other Ambulatory Visit: Payer: 59

## 2020-10-31 DIAGNOSIS — M810 Age-related osteoporosis without current pathological fracture: Secondary | ICD-10-CM | POA: Diagnosis not present

## 2020-10-31 LAB — TISSUE TRANSGLUTAMINASE, IGA: (tTG) Ab, IgA: <1 U/mL

## 2020-10-31 LAB — CALCIUM, IONIZED: Calcium, Ion: 4.96 mg/dL (ref 4.8–5.6)

## 2020-10-31 LAB — PARATHYROID HORMONE, INTACT (NO CA): PTH: 28 pg/mL (ref 16–77)

## 2020-11-01 ENCOUNTER — Other Ambulatory Visit: Payer: Self-pay

## 2020-11-02 ENCOUNTER — Telehealth: Payer: Self-pay | Admitting: Internal Medicine

## 2020-11-02 ENCOUNTER — Encounter: Payer: Self-pay | Admitting: Family Medicine

## 2020-11-02 DIAGNOSIS — M19012 Primary osteoarthritis, left shoulder: Secondary | ICD-10-CM | POA: Diagnosis not present

## 2020-11-02 NOTE — Telephone Encounter (Signed)
Can you please add this patient to the Prolia list?    Thank you

## 2020-11-03 ENCOUNTER — Ambulatory Visit: Payer: 59 | Admitting: Family

## 2020-11-03 LAB — TIQ-MISC

## 2020-11-03 LAB — CALCIUM, URINE, 24 HOUR: Calcium, 24H Urine: 313 mg/24 h — ABNORMAL HIGH

## 2020-11-03 LAB — CREATININE, URINE, 24 HOUR: Creatinine, 24H Ur: 0.93 g/(24.h) (ref 0.50–2.15)

## 2020-11-04 ENCOUNTER — Ambulatory Visit (INDEPENDENT_AMBULATORY_CARE_PROVIDER_SITE_OTHER): Payer: 59 | Admitting: Family

## 2020-11-04 ENCOUNTER — Encounter: Payer: Self-pay | Admitting: Family

## 2020-11-04 ENCOUNTER — Encounter: Payer: Self-pay | Admitting: Internal Medicine

## 2020-11-04 ENCOUNTER — Other Ambulatory Visit: Payer: Self-pay

## 2020-11-04 VITALS — BP 110/80 | HR 63 | Temp 97.3°F | Ht 70.0 in | Wt 143.2 lb

## 2020-11-04 DIAGNOSIS — G4709 Other insomnia: Secondary | ICD-10-CM | POA: Diagnosis not present

## 2020-11-04 DIAGNOSIS — M159 Polyosteoarthritis, unspecified: Secondary | ICD-10-CM

## 2020-11-04 DIAGNOSIS — E7800 Pure hypercholesterolemia, unspecified: Secondary | ICD-10-CM | POA: Diagnosis not present

## 2020-11-04 DIAGNOSIS — H524 Presbyopia: Secondary | ICD-10-CM | POA: Diagnosis not present

## 2020-11-04 DIAGNOSIS — M8949 Other hypertrophic osteoarthropathy, multiple sites: Secondary | ICD-10-CM

## 2020-11-04 DIAGNOSIS — R232 Flushing: Secondary | ICD-10-CM

## 2020-11-04 NOTE — Progress Notes (Signed)
Provider: Marlowe Sax FNP-C   Rubby Barbary, Nelda Bucks, NP  Patient Care Team: Kymir Coles, Nelda Bucks, NP as PCP - General (Family Medicine) Kennith Center, RD as Dietitian (Family Medicine) Katy Apo, MD as Consulting Physician (Ophthalmology) Adornetto, Lucianne Lei, DMD (Dentistry) Kennith Center, RD as Dietitian [facility] Medicine)  Extended Emergency Contact Information Primary Emergency Contact: [facility] Address: [address] [place], [place] [address] Johnnette Litter of [place] Phone: [phone] Work Phone: [phone] Mobile Phone: [phone] Relation: Spouse  Code Status:  Full Code  Goals of care: Advanced Directive information Advanced Directives 11/04/2020  Does Patient Have a Medical Advance Directive? Yes  Type of Paramedic of [place];Living will;Out of facility DNR (pink MOST or yellow form)  Does patient want to make changes to medical advance directive? No - Patient declined  Copy of [place] in Chart? Yes - validated most recent copy scanned in chart (See row information)  Pre-existing out of facility DNR order (yellow form or pink MOST form) -     Chief Complaint  Patient presents with   Medical Management of Chronic Issues    6 month follow up.  Patient would like to discuss several concerns.   Health Maintenance    2nd COVID booster, Flu vaccine    HPI:  Pt is a 60 y.o. female seen today for 6 months follow up for medical management of chronic diseases. Has a medical history of Hyperlipidemia,Osteoarthritis,Anemia,Insomnia among others. She was seen by Dr. Hulan Saas at  [facility] Medicine on 10/28/2020 for low back pain was told has L4 nerve root impingement plan for radiofrequency ablation.PE MRI of the pelvis also recommended due to Osteoporosis high risk for fracture.Endocrinologist ? Prolia.Trys to eat food rich in calcium.Also on calcium and vit d supplement.   She status post  right reverse shoulder arthroplasty done 03/24/2020 by Dr.Supple Lennette Bihari.States plans for left shoulder arthroplasty too. Complains hot Flushes.would like some natural remedies.   Her latest  lab work results reviewed and discussed during visit.LDL within normal range.takes Red yeast rice for cholesterol twice daily.discussed reducing to once daily.  Immunization reviewed due for influenza and 2nd COVID-19 booster vaccine.    Past Medical History:  Diagnosis Date   Anemia    Arthritis    Hyperlipidemia    Insomnia    Mitral valve prolapse    PONV (postoperative nausea and vomiting)    Past Surgical History:  Procedure Laterality Date   AUGMENTATION MAMMAPLASTY Bilateral    Dec 2019   BREAST BIOPSY Right    axillary node benign   KNEE SURGERY  1988   right   SHOULDER ARTHROSCOPY WITH LABRAL REPAIR Right 08/28/2012   Procedure: RIGHT SHOULDER ARTHROSCOPY WITH LABRAL DEBRIDEMENT, ASPIRATION AND DECOMPRESSION OF PARALABRAL CYST;  Surgeon: Marin Shutter, MD;  Location: [place];  Service: Orthopedics;  Laterality: Right;   TOTAL SHOULDER ARTHROPLASTY Right 03/24/2020   Procedure: RIGHT REVERSE SHOULDER ARTHROPLASTY;  Surgeon: Justice Britain, MD;  Location[facility];  Service: Orthopedics;  Laterality: Right;  173mn    Allergies  Allergen Reactions   Codeine Nausea And Vomiting   Hydrocodone Nausea And Vomiting   Triple Antibiotic Pain Relief [Neomy-Bacit-Polymyx-Pramoxine] Other (See Comments)    Burn   Triple Antibiotic W/Hydrocortisone  [Bacitra-Neomycin-Polymyxin-Hc]    Contrast Media [Iodinated Diagnostic Agents] Hives    After CT scan with contrast, pt had MRI without contrast. Pt informed  MRI Tech that she had a rash on her legs. MRI Tech spoke with Radiologist after MRI was completed. Per Rad, since patient was here x 30 minutes after CT contrast and was doing fine, with rash fading, pt was allowed to leave facility. Alert and oriented. jmh    Allergies as of 11/04/2020        Reactions   Codeine Nausea And Vomiting   Hydrocodone Nausea And Vomiting   Triple Antibiotic Pain Relief [neomy-bacit-polymyx-pramoxine] Other (See Comments)   Burn   Triple Antibiotic W/hydrocortisone  [bacitra-neomycin-polymyxin-hc]    Contrast Media [iodinated Diagnostic Agents] Hives   After CT scan with contrast, pt had MRI without contrast. Pt informed MRI Tech that she had a rash on her legs. MRI Tech spoke with Radiologist after MRI was completed. Per Rad, since patient was here x 30 minutes after CT contrast and was doing fine, with rash fading, pt was allowed to leave facility. Alert and oriented. jmh        Medication List        Accurate as of November 04, 2020 11:59 PM. If you have any questions, ask your nurse or doctor.          CALCIUM CITRATE PO Take 400 mg by mouth 3 (three) times daily. Number varies each day based on calcium ingested in food   cetirizine 10 MG tablet Commonly known as: ZYRTEC Take 10 mg by mouth daily as needed for allergies or rhinitis.   Efinaconazole 10 % Soln Apply 1 drop topically daily.   Fish Oil 1000 MG Caps Take 1,000 mg by mouth 2 (two) times daily. Vit d3 2000 IU   fluticasone 50 MCG/ACT nasal spray Commonly known as: FLONASE Place 1 spray into both nostrils 3 (three) times daily as needed for rhinitis or allergies.   multivitamin with minerals Tabs tablet Take 1 tablet by mouth daily.   OVER THE COUNTER MEDICATION Take 2 tablets by mouth at bedtime. Formula 303  supplement for pain and rest   OVER THE COUNTER MEDICATION Take 600 mg by mouth 2 (two) times daily. Red yeast rice   Tart Cherry Ultra 1200 MG Caps Generic drug: Tart Cherry daily.   tretinoin 0.05 % cream Commonly known as: RETIN-A Apply 1 application topically at bedtime.   TRI-LUMA EX Apply 1 application topically at bedtime.   Vitamin D3 50 MCG (2000 UT) Tabs Take 1 tablet by mouth daily.        Review of Systems  Constitutional:   Negative for appetite change, chills, fatigue, fever and unexpected weight change.  HENT:  Negative for congestion, dental problem, ear discharge, ear pain, facial swelling, hearing loss, nosebleeds, postnasal drip, rhinorrhea, sinus pressure, sinus pain, sneezing, sore throat, tinnitus and trouble swallowing.   Eyes:  Negative for pain, discharge, redness, itching and visual disturbance.  Respiratory:  Negative for cough, chest tightness, shortness of breath and wheezing.   Cardiovascular:  Negative for chest pain, palpitations and leg swelling.  Gastrointestinal:  Negative for abdominal distention, abdominal pain, blood in stool, constipation, diarrhea, nausea and vomiting.  Endocrine: Negative for cold intolerance, heat intolerance, polydipsia, polyphagia and polyuria.       Hot flushes   Genitourinary:  Negative for difficulty urinating, dysuria, flank pain, frequency and urgency.  Musculoskeletal:  Positive for arthralgias. Negative for back pain, gait problem, joint swelling, myalgias, neck pain and neck stiffness.       Shoulder,hip/pelvic pain takes OTC Formula 303   Skin:  Negative for  color change, pallor, rash and wound.  Neurological:  Negative for dizziness, syncope, speech difficulty, weakness, light-headedness, numbness and headaches.  Hematological:  Does not bruise/bleed easily.  Psychiatric/Behavioral:  Positive for sleep disturbance. Negative for agitation, behavioral problems, confusion, hallucinations, self-injury and suicidal ideas. The patient is not nervous/anxious.        OTC sleep aid    Immunization History  Administered Date(s) Administered   Influenza,inj,quad, With Preservative 12/21/2019   Influenza-Unspecified 09/20/2018, 11/23/2019   PFIZER(Purple Top)SARS-COV-2 Vaccination 02/26/2019, 03/19/2019, 12/11/2019   Tdap 02/21/2015   Zoster Recombinat (Shingrix) 11/14/2017, 01/20/2018   Pertinent  Health Maintenance Due  Topic Date Due   INFLUENZA VACCINE   09/19/2020   PAP SMEAR-Modifier  02/07/2021   COLONOSCOPY (Pts 45-24yr Insurance coverage will need to be confirmed)  02/19/2022   MAMMOGRAM  10/06/2022   DEXA SCAN  10/06/2022   Fall Risk  11/04/2020 12/31/2019 04/23/2019 02/23/2019  Falls in the past year? 0 0 0 1  Number falls in past yr: 0 0 0 0  Injury with Fall? 0 0 0 0  Risk for fall due to : No Fall Risks - - -  Follow up Falls evaluation completed - - -   Functional Status Survey:    Vitals:   11/04/20 1541  BP: 110/80  Pulse: 63  Temp: (!) 97.3 F (36.3 C)  SpO2: 97%  Weight: 143 lb 3.2 oz (65 kg)  Height: '5\' 10"'$  (1.778 m)   Body mass index is 20.55 kg/m. Physical Exam Vitals reviewed.  Constitutional:      General: She is not in acute distress.    Appearance: Normal appearance. She is normal weight. She is not ill-appearing or diaphoretic.  HENT:     Head: Normocephalic.     Right Ear: Tympanic membrane, ear canal and external ear normal. There is no impacted cerumen.     Left Ear: Tympanic membrane, ear canal and external ear normal. There is no impacted cerumen.     Nose: Nose normal. No congestion or rhinorrhea.     Mouth/Throat:     Mouth: Mucous membranes are moist.     Pharynx: Oropharynx is clear. No oropharyngeal exudate or posterior oropharyngeal erythema.  Eyes:     General: No scleral icterus.       Right eye: No discharge.        Left eye: No discharge.     Extraocular Movements: Extraocular movements intact.     Conjunctiva/sclera: Conjunctivae normal.     Pupils: Pupils are equal, round, and reactive to light.  Neck:     Vascular: No carotid bruit.  Cardiovascular:     Rate and Rhythm: Normal rate and regular rhythm.     Pulses: Normal pulses.     Heart sounds: Normal heart sounds.    No friction rub. No gallop.     Comments: Midsystolic click  Pulmonary:     Effort: Pulmonary effort is normal. No respiratory distress.     Breath sounds: Normal breath sounds. No wheezing, rhonchi or  rales.  Chest:     Chest wall: No tenderness.  Abdominal:     General: Bowel sounds are normal. There is no distension.     Palpations: Abdomen is soft. There is no mass.     Tenderness: There is no abdominal tenderness. There is no right CVA tenderness, left CVA tenderness, guarding or rebound.  Musculoskeletal:        General: No swelling or tenderness.     Cervical  back: Normal range of motion. No rigidity or tenderness.     Right lower leg: No edema.     Left lower leg: No edema.     Comments: Left shoulder pain   Lymphadenopathy:     Cervical: No cervical adenopathy.  Skin:    General: Skin is warm and dry.     Coloration: Skin is not pale.     Findings: No bruising, erythema, lesion or rash.  Neurological:     Mental Status: She is alert and oriented to person, place, and time.     Cranial Nerves: No cranial nerve deficit.     Sensory: No sensory deficit.     Motor: No weakness.     Coordination: Coordination normal.     Gait: Gait normal.  Psychiatric:        Mood and Affect: Mood normal.        Speech: Speech normal.        Behavior: Behavior normal.        Thought Content: Thought content normal.        Judgment: Judgment normal.    Labs reviewed: Recent Labs    03/21/20 0844 03/24/20 0814 10/28/20 1159  NA 136 140 137  K 5.1 4.1 4.3  CL 100 104 101  CO2 '27 27 30  '$ GLUCOSE 91 82 89  BUN '17 16 17  '$ CREATININE 0.48 0.67 0.69  CALCIUM 9.6 9.5 9.7  MG  --   --  2.1  PHOS  --   --  3.8   Recent Labs    02/26/20 1011 03/24/20 0814 10/28/20 1159  AST 34 41 30  ALT '20 29 21  '$ ALKPHOS 64 71 70  BILITOT 0.5 0.3 0.5  PROT 7.2 7.0 7.3  ALBUMIN 4.6 4.5 4.5   Recent Labs    02/26/20 1011 03/21/20 0844 04/25/20 0808  WBC 5.3 4.8 4.0  NEUTROABS 3.2  --  2,432  HGB 14.5 15.0 14.1  HCT 42.3 44.2 41.8  MCV 97.7 99.3 98.8  PLT 208.0 206 229   Lab Results  Component Value Date   TSH 1.67 10/28/2020   No results found for: HGBA1C Lab Results  Component  Value Date   CHOL 164 04/25/2020   HDL 62 04/25/2020   LDLCALC 88 04/25/2020   TRIG 54 04/25/2020   CHOLHDL 2.6 04/25/2020    Significant Diagnostic Results in last 30 days:  No results found.  Assessment/Plan 1. Pure hypercholesterolemia Latest LDL at goal Reduce red yeast rice from twice daily to once daily   2. Primary osteoarthritis involving multiple joints Continue OTC analgesic  Follow up with Orthopedic as directed  3. Hot flashes Worsening will refer to Gynecology would like something natural to alleviate symptoms.  - Ambulatory referral to Gynecology  4. Other insomnia Continue on OTC sleep aid and Sleep Hygiene   Family/ staff Communication: Reviewed plan of care with patient verbalized understanding.   Labs/tests ordered: None   Next Appointment : 6 Annual Physical Examination with Fasting Labs  Sandrea Hughs, NP

## 2020-11-07 NOTE — Telephone Encounter (Signed)
VOB initiated via [email or web address]

## 2020-11-12 ENCOUNTER — Other Ambulatory Visit: Payer: 59

## 2020-11-14 ENCOUNTER — Encounter: Payer: Self-pay | Admitting: Internal Medicine

## 2020-11-14 NOTE — Telephone Encounter (Signed)
PA required  PA PROCESS DETAILS: PA is required and is currently not on file. Please complete form at [email or web address]

## 2020-11-15 ENCOUNTER — Telehealth: Payer: Self-pay

## 2020-11-15 NOTE — Telephone Encounter (Signed)
Prior Auth pending via [place] provider portal  Cert Number: [id number]

## 2020-11-15 NOTE — Telephone Encounter (Signed)
Has the PA been filled out are do we need to go to website and fill out.

## 2020-11-16 ENCOUNTER — Other Ambulatory Visit (HOSPITAL_COMMUNITY): Payer: Self-pay

## 2020-11-16 ENCOUNTER — Encounter: Payer: Self-pay | Admitting: Podiatry

## 2020-11-16 NOTE — Telephone Encounter (Signed)
Vm left for patient that PA has been submitted and waiting approval

## 2020-11-21 ENCOUNTER — Encounter: Payer: Self-pay | Admitting: Internal Medicine

## 2020-11-21 NOTE — Telephone Encounter (Signed)
Pt ready for scheduling on or after 11/21/20  Out-of-pocket cost due at time of visit: $0.00  Primary: [employer] Prolia co-insurance: 0% Admin fee co-insurance: 0%  Secondary: n/a Prolia co-insurance:  Admin fee co-insurance:   Deductible: $3,000 of $3,000 met OOP Max: $4,000 of $4,000 met  Prior Auth: APPROVED PA# [id number] Valid: 10/21/20-10/21/21    ** This summary of benefits is an estimation of the patient's out-of-pocket cost. Exact cost may very based on individual plan coverage.

## 2020-11-21 NOTE — Telephone Encounter (Signed)
PreD Approved  Auth Number: [id number],  valid 10/21/2020 - 10/21/2021.  For Questions please call [phone]. Thank you.

## 2020-11-21 NOTE — Telephone Encounter (Signed)
Vm left for patient to callback and schedule visit

## 2020-11-22 ENCOUNTER — Other Ambulatory Visit: Payer: Self-pay | Admitting: Internal Medicine

## 2020-11-22 DIAGNOSIS — R82994 Hypercalciuria: Secondary | ICD-10-CM

## 2020-11-22 DIAGNOSIS — M810 Age-related osteoporosis without current pathological fracture: Secondary | ICD-10-CM

## 2020-11-22 NOTE — Telephone Encounter (Signed)
Patient has been scheduled

## 2020-11-22 NOTE — Telephone Encounter (Signed)
Good Morning I schedule you for 11/29/2020 at 2pm for Prolia injection.

## 2020-11-25 ENCOUNTER — Telehealth: Payer: Self-pay

## 2020-11-25 NOTE — Telephone Encounter (Signed)
LVM to pt advising his insurance was unable to authorize Jublia 10% topical solution at [facility]

## 2020-11-28 ENCOUNTER — Encounter: Payer: Self-pay | Admitting: Internal Medicine

## 2020-11-29 ENCOUNTER — Ambulatory Visit: Payer: 59

## 2020-11-29 NOTE — Progress Notes (Signed)
[place] Phone: [phone] Subjective:   Tara [name], am serving as a scribe for Dr. Hulan Saas.  This visit occurred during the SARS-CoV-2 public health emergency.  Safety protocols were in place, including screening questions prior to the visit, additional usage of staff PPE, and extensive cleaning of exam room while observing appropriate contact time as indicated for disinfecting solutions.   I'm seeing this patient by the request  of:  [name], [name], NP  CC: Back pain follow-up  [id number]  10/28/2020 Patient continues to have back pain that seems to be at a proportion to the amount of findings.  Patient does have an L4 nerve root impingement and we are looking into the possibility of radiofrequency ablation.  The patient is going to be sent for further evaluation of this.  In addition to this does secondary to patient's severity of the osteoporosis patient could be a increased risk of a fracture.  I do feel a PE MRI of the pelvis with and without contrast will be beneficial to further evaluate for any type of fracture that could be continuing to give patient the left leg pain while we continue to try further evaluate for the possible nerve impingement as well.   Updated 11/30/2020 Tara [name] is a 60 y.o. female coming in with complaint of back pain. [name] change since last visit. Pain L glute, hip and L knee. Also continues to have pain in L groin. Pain is worse with sitting.  Patient has tightness.  Patient is trying to be active but is finding it difficult.  He was going to have the radiofrequency ablation but at the moment still thinks she may want to hold off.  Patient is going to be undergoing a shoulder replacement potentially in the near future as well on the contralateral side where she is already had 1.     Past Medical History:  Diagnosis Date   Anemia    Arthritis    Hyperlipidemia     Insomnia    Mitral valve prolapse    PONV (postoperative nausea and vomiting)    Past Surgical History:  Procedure Laterality Date   AUGMENTATION MAMMAPLASTY Bilateral    Dec 2019   BREAST BIOPSY Right    axillary node benign   KNEE SURGERY  1988   right   SHOULDER ARTHROSCOPY WITH LABRAL REPAIR Right 08/28/2012   Procedure: RIGHT SHOULDER ARTHROSCOPY WITH LABRAL DEBRIDEMENT, ASPIRATION AND DECOMPRESSION OF PARALABRAL CYST;  Surgeon: Marin Shutter, MD;  Location: [facility];  Service: Orthopedics;  Laterality: Right;   TOTAL SHOULDER ARTHROPLASTY Right 03/24/2020   Procedure: RIGHT REVERSE SHOULDER ARTHROPLASTY;  Surgeon: Justice Britain, MD;  Location: [facility];  Service: Orthopedics;  Laterality: Right;  143min   Social History   Socioeconomic History   Marital status: Married    Spouse name: Not on file   Number of children: Not on file   Years of education: Not on file   Highest education level: Not on file  Occupational History   Not on file  Tobacco Use   Smoking status: Never   Smokeless tobacco: Never  Vaping Use   Vaping Use: Never used  Substance and Sexual Activity   Alcohol use: [name]   Drug use: [name]   Sexual activity: Not on file  Other Topics Concern   Not on file  Social History Narrative   Tobacco use, amount per day now: 0  Past tobacco use, amount per day: 0   How many years did you use tobacco: 0   Alcohol use (drinks per week): 0   Diet: Heart Healthy/DASH Diet/ Lean and Clean type foods.   Do you drink/eat things with caffeine: 1 glass of Tea in the morning.   Marital status: Married                                 What year were you married? 1987   Do you live in a house, apartment, assisted living, condo, trailer, etc.? House   Is it one or more stories? Yes   How many persons live in your home? 2 ( Husband/ Self )   Do you have pets in your home?( please list) Not currently, Dog Deceased.    Current or past profession: PhD/RN   Do you exercise?    Yes                               Type and how often? 6 Days a week.   Do you have a living will? Yes   Do you have a DNR form?   [name]                               If not, do you want to discuss one? Yes   Do you have signed POA/HPOA forms? Yes                       If so, please bring to you appointment   Social Determinants of Health   Financial Resource Strain: Not on file  Food Insecurity: Not on file  Transportation Needs: Not on file  Physical Activity: Not on file  Stress: Not on file  Social Connections: Not on file   Allergies  Allergen Reactions   Codeine Nausea And Vomiting   Hydrocodone Nausea And Vomiting   Triple Antibiotic Pain Relief [Neomy-Bacit-Polymyx-Pramoxine] Other (See Comments)    Burn   Triple Antibiotic W/Hydrocortisone  [Bacitra-Neomycin-Polymyxin-Hc]    Contrast Media [Iodinated Diagnostic Agents] Hives    After CT scan with contrast, pt had MRI without contrast. Pt informed MRI Tech that she had a rash on her legs. MRI Tech spoke with Radiologist after MRI was completed. Per Rad, since patient was here x 30 minutes after CT contrast and was doing fine, with rash fading, pt was allowed to leave facility. Alert and oriented. jmh   Family History  Problem Relation Age of Onset   Stroke Mother    Sudden death Neg Hx    Hypertension Neg Hx    Hyperlipidemia Neg Hx    Heart attack Neg Hx    Diabetes Neg Hx    Breast cancer Neg Hx       Current Outpatient Medications (Respiratory):    cetirizine (ZYRTEC) 10 MG tablet, Take 10 mg by mouth daily as needed for allergies or rhinitis.   fluticasone (FLONASE) 50 MCG/ACT nasal spray, Place 1 spray into both nostrils 3 (three) times daily as needed for rhinitis or allergies.    Current Outpatient Medications (Other):    CALCIUM CITRATE PO, Take 400 mg by mouth 3 (three) times daily. Number varies each day based on calcium ingested in food   Cholecalciferol (VITAMIN D3) 50 MCG (2000  UT) TABS, Take 1 tablet by mouth  daily.   Efinaconazole 10 % SOLN, Apply 1 drop topically daily.   Fluocin-Hydroquinone-Tretinoin (TRI-LUMA EX), Apply 1 application topically at bedtime.   Multiple Vitamin (MULTIVITAMIN WITH MINERALS) TABS, Take 1 tablet by mouth daily.   Omega-3 Fatty Acids (FISH OIL) 1000 MG CAPS, Take 1,000 mg by mouth 2 (two) times daily. Vit d3 2000 IU   OVER THE COUNTER MEDICATION, Take 2 tablets by mouth at bedtime. Formula 303  supplement for pain and rest   OVER THE COUNTER MEDICATION, Take 600 mg by mouth 2 (two) times daily. Red yeast rice   Tart Cherry (TART CHERRY ULTRA) 1200 MG CAPS, daily.   tretinoin (RETIN-A) 0.05 % cream, Apply 1 application topically at bedtime.    Review of Systems:  [name] headache, visual changes, nausea, vomiting, diarrhea, constipation, dizziness, abdominal pain, skin rash, fevers, chills, night sweats, weight loss, swollen lymph nodes,  joint swelling, chest pain, shortness of breath, mood changes. POSITIVE muscle aches, body aches  Objective  Blood pressure 102/78, pulse 69, height 5\' 10"  (1.778 m), weight 145 lb (65.8 kg), last menstrual period 08/14/2012, SpO2 99 %.   General: [name] apparent distress alert and oriented x3 mood and affect normal, dressed appropriately.  HEENT: Pupils equal, extraocular movements intact  Respiratory: Patient's speak in full sentences and does not appear short of breath  Cardiovascular: [name] lower extremity edema, non tender, [name] erythema  Gait normal with good balance and coordination.  MSK: Patient's low back does have some loss of lordosis.  Tightness noted in the paraspinal musculature.  Mild tightness noted.  Positive straight leg test with radicular symptoms in the L4 area.  Patient is having increasing tenderness [name] noted on the back from previous exam.  Tightness in the hips noted bilaterally more than what would be considered normal.  Osteopathic findings L1 flexed rotated and side bent right L4 flexed rotated and side bent  left Sacrum right on right   Impression and Recommendations:    The above documentation has been reviewed and is accurate and complete Lyndal Pulley, DO

## 2020-11-30 ENCOUNTER — Other Ambulatory Visit: Payer: Self-pay

## 2020-11-30 ENCOUNTER — Ambulatory Visit (INDEPENDENT_AMBULATORY_CARE_PROVIDER_SITE_OTHER): Payer: 59 | Admitting: Family Medicine

## 2020-11-30 ENCOUNTER — Ambulatory Visit
Admission: RE | Admit: 2020-11-30 | Discharge: 2020-11-30 | Disposition: A | Discharge status: Home / Self Care | Payer: 59 | Source: Ambulatory Visit | Attending: Family Medicine | Admitting: Family Medicine

## 2020-11-30 VITALS — BP 102/78 | HR 69 | Ht 70.0 in | Wt 145.0 lb

## 2020-11-30 DIAGNOSIS — M9903 Segmental and somatic dysfunction of lumbar region: Secondary | ICD-10-CM | POA: Diagnosis not present

## 2020-11-30 DIAGNOSIS — M25552 Pain in left hip: Secondary | ICD-10-CM

## 2020-11-30 DIAGNOSIS — M9902 Segmental and somatic dysfunction of thoracic region: Secondary | ICD-10-CM | POA: Diagnosis not present

## 2020-11-30 DIAGNOSIS — M9904 Segmental and somatic dysfunction of sacral region: Secondary | ICD-10-CM

## 2020-11-30 DIAGNOSIS — G8929 Other chronic pain: Secondary | ICD-10-CM

## 2020-11-30 DIAGNOSIS — M5442 Lumbago with sciatica, left side: Secondary | ICD-10-CM

## 2020-11-30 DIAGNOSIS — M999 Biomechanical lesion, unspecified: Secondary | ICD-10-CM | POA: Diagnosis not present

## 2020-11-30 DIAGNOSIS — M160 Bilateral primary osteoarthritis of hip: Secondary | ICD-10-CM | POA: Diagnosis not present

## 2020-11-30 MED ORDER — GADOBENATE DIMEGLUMINE 529 MG/ML IV SOLN
14.0000 mL | Freq: Once | INTRAVENOUS | Status: AC | PRN
  Administered 2020-11-30: 14 mL via INTRAVENOUS

## 2020-11-30 NOTE — Patient Instructions (Signed)
Good to see you Hope manipulation helps Keep up with Supple and the shoulder Let's see what MRI shows and if Prolia makes diff See me in 5 weeks in case you get shoulder replaced

## 2020-12-01 ENCOUNTER — Encounter: Payer: Self-pay | Admitting: Family Medicine

## 2020-12-01 NOTE — Assessment & Plan Note (Signed)
Significant low back pain.  Does respond somewhat to osteopathic manipulation.  Patient does have osteoporosis as well as has had any significant arthritic changes of multiple other joints.  We have done significant work-up already at this moment.  Reviewed the labs again encourage patient to try to stay active where possible but more of the lower impact exercises.  Follow-up again in 4 to 8 weeks.

## 2020-12-01 NOTE — Assessment & Plan Note (Signed)

## 2020-12-02 ENCOUNTER — Ambulatory Visit (INDEPENDENT_AMBULATORY_CARE_PROVIDER_SITE_OTHER): Payer: 59

## 2020-12-02 ENCOUNTER — Encounter: Payer: Self-pay | Admitting: Family Medicine

## 2020-12-02 ENCOUNTER — Other Ambulatory Visit (HOSPITAL_COMMUNITY): Payer: Self-pay

## 2020-12-02 ENCOUNTER — Other Ambulatory Visit: Payer: Self-pay

## 2020-12-02 DIAGNOSIS — M810 Age-related osteoporosis without current pathological fracture: Secondary | ICD-10-CM

## 2020-12-02 MED ORDER — DENOSUMAB 60 MG/ML ~~LOC~~ SOSY
60.0000 mg | PREFILLED_SYRINGE | Freq: Once | SUBCUTANEOUS | Status: AC
  Administered 2020-12-02: 60 mg via SUBCUTANEOUS

## 2020-12-02 NOTE — Progress Notes (Addendum)
Prolia injection given successful

## 2020-12-07 ENCOUNTER — Ambulatory Visit: Payer: 59 | Admitting: Family Medicine

## 2020-12-09 ENCOUNTER — Ambulatory Visit: Payer: 59 | Admitting: Family Medicine

## 2020-12-09 ENCOUNTER — Other Ambulatory Visit (HOSPITAL_BASED_OUTPATIENT_CLINIC_OR_DEPARTMENT_OTHER): Payer: Self-pay

## 2020-12-09 MED ORDER — COVID-19 AT-HOME TEST VI KIT
PACK | 0 refills | Status: DC
  Filled 2020-12-09: qty 2, 2d supply, fill #0

## 2020-12-18 NOTE — Telephone Encounter (Signed)
Last Prolia inj 12/02/20 ?Next Prolia inj due 06/03/21 ?

## 2020-12-26 ENCOUNTER — Ambulatory Visit: Payer: 59 | Admitting: Family Medicine

## 2020-12-28 ENCOUNTER — Other Ambulatory Visit (HOSPITAL_COMMUNITY): Payer: Self-pay

## 2020-12-28 ENCOUNTER — Ambulatory Visit (INDEPENDENT_AMBULATORY_CARE_PROVIDER_SITE_OTHER): Payer: 59 | Admitting: Sports Medicine

## 2020-12-28 ENCOUNTER — Other Ambulatory Visit: Payer: Self-pay

## 2020-12-28 VITALS — BP 102/78 | HR 57 | Ht 70.0 in | Wt 144.0 lb

## 2020-12-28 DIAGNOSIS — M5442 Lumbago with sciatica, left side: Secondary | ICD-10-CM

## 2020-12-28 DIAGNOSIS — G8929 Other chronic pain: Secondary | ICD-10-CM | POA: Diagnosis not present

## 2020-12-28 MED ORDER — MELOXICAM 7.5 MG PO TABS
7.5000 mg | ORAL_TABLET | Freq: Every day | ORAL | 0 refills | Status: DC
  Filled 2020-12-28: qty 30, 30d supply, fill #0

## 2020-12-28 NOTE — Progress Notes (Signed)
Benito Mccreedy D.[place] Phone: [phone]   Assessment and Plan:     1. Chronic bilateral low back pain with left-sided sciatica -Chronic with exacerbation, subsequent visit - Unclear etiology of chronic low back pain with left-sided sciatica, left-sided piriformis pain.  Differential includes DDD lumbar spine with radiculopathy, piriformis syndrome, FAI, labral tear of hip - Patient believes that she has had a arthrogram of left hip, however I cannot find this in her chart.  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 - Discussed the option of doing a corticosteroid injection to intra-articular left hip to see if this is beneficial.  Patient stated that she did not want CSI because she thought that cortisone would just be a temporary relief - Patient was agreeable to trialing meloxicam 7.5 mg daily x3 weeks along with activity modification to decrease hip flexion activities to try and allow this area to rest   Pertinent previous records reviewed include pelvis x-ray 02/26/2020, lumbar MRI 03/15/2020, lumbar x-ray 02/26/2020   Follow Up: 4 weeks for reevaluation to see if NSAID course and activity modification were beneficial.   Subjective:   I, Judy Pimple, am serving as a scribe for Dr. Glennon Mac  Chief Complaint: back pain   HPI:   12/28/20 Patient is a 60 year old female presenting with low back pain. Patient was last seen by Dr. Tamala Julian on 11/30/20 for this reason and had OMT. Today patient states that her neck and upper back is where she carries her tension and then the Left hip pain that radiates to the pubic area and down the leg. She has so much pain and numbness in that area to where she cannot sit for longer than 5 minutes so car rides or plane rides a terrible. Patient wants to know if there is  anything that will help with that pain along with the OMT.  Relevant Historical Information: Mitral valve prolapse, osteoporosis, polyarthralgia  Additional pertinent review of systems negative.  Current Outpatient Medications  Medication Sig Dispense Refill   CALCIUM CITRATE PO Take 400 mg by mouth 3 (three) times daily. Number varies each day based on calcium ingested in food     cetirizine (ZYRTEC) 10 MG tablet Take 10 mg by mouth daily as needed for allergies or rhinitis.     Cholecalciferol (VITAMIN D3) 50 MCG (2000 UT) TABS Take 1 tablet by mouth daily.     COVID-19 At-Home Test KIT as directed 2 kit 0   Efinaconazole 10 % SOLN Apply 1 drop topically daily. 4 mL 11   Fluocin-Hydroquinone-Tretinoin (TRI-LUMA EX) Apply 1 application topically at bedtime.     fluticasone (FLONASE) 50 MCG/ACT nasal spray Place 1 spray into both nostrils 3 (three) times daily as needed for rhinitis or allergies.     meloxicam (MOBIC) 7.5 MG tablet Take 1 tablet (7.5 mg total) by mouth daily. 30 tablet 0   Multiple Vitamin (MULTIVITAMIN WITH MINERALS) TABS Take 1 tablet by mouth daily.     Omega-3 Fatty Acids (FISH OIL) 1000 MG CAPS Take 1,000 mg by mouth 2 (two) times daily. Vit d3 2000 IU     OVER THE COUNTER MEDICATION Take 2 tablets by mouth at bedtime. Formula 303  supplement for pain and rest     OVER THE COUNTER MEDICATION Take 600  mg by mouth 2 (two) times daily. Red yeast rice     Tart Cherry (TART CHERRY ULTRA) 1200 MG CAPS daily.     tretinoin (RETIN-A) 0.05 % cream Apply 1 application topically at bedtime. 45 g 3   No current facility-administered medications for this visit.      Objective:     Vitals:   12/28/20 1517  BP: 102/78  Pulse: (!) 57  SpO2: 96%  Weight: 144 lb (65.3 kg)  Height: _0  (1.778 m)      Body mass index is 20.66 kg/m.    Physical Exam:     General: awake, alert, and oriented no acute distress, nontoxic Skin: no suspicious lesions or  rashes Neuro:sensation intact distally with no dificits, normal muscle tone, no atrophy, strength 5/5 in all tested lower ext groups Psych: normal mood and affect, speech clear  Left hip: No deformity, swelling or wasting ROM Fexion 90, ext 30, IR 45, ER 45 TTP gluteal musculature, piriformis NTTP over the hip flexors, greater troch, si joint, lumbar spine Negative log roll with FROM Negative FABER Positive FADIR Positive piriformis test Negative trendelenberg Gait normal   Electronically signed by:  Benito Mccreedy D.[facility] 4:32 PM 12/28/20

## 2020-12-28 NOTE — Patient Instructions (Addendum)
Good to see you  Meloxicam 7.5mg  daily for 3 weeks  Avoid jogging and yoga activities for at least 2 weeks  Follow up in 4 weeks

## 2021-01-03 ENCOUNTER — Encounter: Payer: Self-pay | Admitting: Family Medicine

## 2021-01-06 ENCOUNTER — Other Ambulatory Visit: Payer: Self-pay

## 2021-01-06 DIAGNOSIS — S73192D Other sprain of left hip, subsequent encounter: Secondary | ICD-10-CM

## 2021-01-17 ENCOUNTER — Other Ambulatory Visit (HOSPITAL_COMMUNITY): Payer: Self-pay

## 2021-01-20 ENCOUNTER — Other Ambulatory Visit (HOSPITAL_COMMUNITY): Payer: Self-pay

## 2021-01-24 ENCOUNTER — Other Ambulatory Visit (HOSPITAL_COMMUNITY): Payer: Self-pay

## 2021-01-24 ENCOUNTER — Ambulatory Visit: Payer: 59 | Admitting: Family Medicine

## 2021-01-24 MED ORDER — FLUOCIN-HYDROQUINONE-TRETINOIN 0.01-4-0.05 % EX CREA
TOPICAL_CREAM | CUTANEOUS | 3 refills | Status: DC
  Filled 2021-01-24: qty 30, 30d supply, fill #0
  Filled 2021-10-07: qty 30, 30d supply, fill #1

## 2021-01-25 ENCOUNTER — Other Ambulatory Visit (HOSPITAL_COMMUNITY): Payer: Self-pay

## 2021-01-30 ENCOUNTER — Other Ambulatory Visit (HOSPITAL_COMMUNITY): Payer: Self-pay

## 2021-01-30 MED ORDER — CYCLOBENZAPRINE HCL 10 MG PO TABS
10.0000 mg | ORAL_TABLET | Freq: Four times a day (QID) | ORAL | 0 refills | Status: DC | PRN
  Filled 2021-01-30: qty 30, 10d supply, fill #0

## 2021-01-30 MED ORDER — ONDANSETRON HCL 4 MG PO TABS
4.0000 mg | ORAL_TABLET | Freq: Four times a day (QID) | ORAL | 0 refills | Status: DC | PRN
  Filled 2021-01-30: qty 10, 3d supply, fill #0

## 2021-01-30 MED ORDER — HYDROMORPHONE HCL 2 MG PO TABS
2.0000 mg | ORAL_TABLET | Freq: Four times a day (QID) | ORAL | 0 refills | Status: DC | PRN
  Filled 2021-01-30: qty 12, 3d supply, fill #0

## 2021-01-30 MED ORDER — MELOXICAM 15 MG PO TABS
15.0000 mg | ORAL_TABLET | Freq: Every day | ORAL | 1 refills | Status: DC
  Filled 2021-01-30: qty 30, 30d supply, fill #0

## 2021-01-30 MED ORDER — TRAMADOL HCL 50 MG PO TABS
50.0000 mg | ORAL_TABLET | ORAL | 0 refills | Status: DC | PRN
  Filled 2021-01-30: qty 20, 4d supply, fill #0

## 2021-02-01 ENCOUNTER — Ambulatory Visit: Payer: 59 | Admitting: Orthopaedic Surgery

## 2021-02-03 ENCOUNTER — Ambulatory Visit: Payer: 59 | Admitting: Family Medicine

## 2021-02-10 ENCOUNTER — Inpatient Hospital Stay (HOSPITAL_COMMUNITY): Admission: RE | Admit: 2021-02-10 | Payer: 59 | Source: Ambulatory Visit

## 2021-02-10 DIAGNOSIS — M25512 Pain in left shoulder: Secondary | ICD-10-CM | POA: Diagnosis not present

## 2021-02-14 ENCOUNTER — Ambulatory Visit: Payer: 59 | Admitting: Family Medicine

## 2021-02-17 NOTE — Patient Instructions (Addendum)
DUE TO COVID-19 ONLY ONE VISITOR IS ALLOWED TO COME WITH YOU AND STAY IN THE WAITING ROOM ONLY DURING PRE OP AND PROCEDURE.   **NO VISITORS ARE ALLOWED IN THE SHORT STAY AREA OR RECOVERY ROOM!!**       Your procedure is scheduled on: 02/24/20   Report to [facility] Main Entrance    Report to admitting at 5:15 AM   Call this number if you have problems the morning of surgery [phone]   Do not eat food :After Midnight.   May have liquids until 4:30 AM day of surgery  CLEAR LIQUID DIET  Foods Allowed                                                                     Foods Excluded  Water, Black Coffee and tea (no milk or creamer)           liquids that you cannot  Plain Jell-O in any flavor  (No red)                                    see through such as: Fruit ices (not with fruit pulp)                                            milk, soups, orange juice              Iced Popsicles (No red)                                                All solid food                                   Apple juices Sports drinks like Gatorade (No red) Lightly seasoned clear broth or consume(fat free) Sugar   The day of surgery:  Drink ONE (1) Pre-Surgery Clear Ensure by 4:30 am the morning of surgery. Drink in one sitting. Do not sip.  This drink was given to you during your hospital  pre-op appointment visit. Nothing else to drink after completing the  Pre-Surgery Clear Ensure.          If you have questions, please contact your surgeons office.     Oral Hygiene is also important to reduce your risk of infection.                                    Remember - BRUSH YOUR TEETH THE MORNING OF SURGERY WITH YOUR REGULAR TOOTHPASTE   Stop all vitamins and supplements.   Take these medicines the morning of surgery with A SIP OF WATER: Zyrtec  You may not have any metal on your body including hair pins, jewelry, and body piercing             Do not wear  make-up, lotions, powders, perfumes, or deodorant  Do not wear nail polish including gel and S&S, artificial/acrylic nails, or any other type of covering on natural nails including finger and toenails. If you have artificial nails, gel coating, etc. that needs to be removed by a nail salon please have this removed prior to surgery or surgery may need to be canceled/ delayed if the surgeon/ anesthesia feels like they are unable to be safely monitored.   Do not shave  48 hours prior to surgery.    Do not bring valuables to the hospital. [place].    Patients discharged on the day of surgery will not be allowed to drive home.              Please read over the following fact sheets you were given: IF YOU HAVE QUESTIONS ABOUT YOUR PRE-OP INSTRUCTIONS PLEASE CALL [place] - Preparing for Surgery Before surgery, you can play an important role.  Because skin is not sterile, your skin needs to be as free of germs as possible.  You can reduce the number of germs on your skin by washing with CHG (chlorahexidine gluconate) soap before surgery.  CHG is an antiseptic cleaner which kills germs and bonds with the skin to continue killing germs even after washing. Please DO NOT use if you have an allergy to CHG or antibacterial soaps.  If your skin becomes reddened/irritated stop using the CHG and inform your nurse when you arrive at Short Stay. Do not shave (including legs and underarms) for at least 48 hours prior to the first CHG shower.  You may shave your face/neck.  Please follow these instructions carefully:  1.  Shower with CHG Soap the night before surgery and the  morning of surgery.  2.  If you choose to wash your hair, wash your hair first as usual with your normal  shampoo.  3.  After you shampoo, rinse your hair and body thoroughly to remove the shampoo.                             4.  Use CHG as you would any other liquid  soap.  You can apply chg directly to the skin and wash.  Gently with a scrungie or clean washcloth.  5.  Apply the CHG Soap to your body ONLY FROM THE NECK DOWN.   Do   not use on face/ open                           Wound or open sores. Avoid contact with eyes, ears mouth and   genitals (private parts).                       Wash face,  Genitals (private parts) with your normal soap.             6.  Wash thoroughly, paying special attention to the area where your    surgery  will be performed.  7.  Thoroughly rinse your body with warm water from the neck down.  8.  DO NOT shower/wash with your normal soap after using and rinsing off the CHG Soap.                9.  Pat yourself dry with a clean towel.            10.  Wear clean pajamas.            11.  Place clean sheets on your bed the night of your first shower and do not  sleep with pets. Day of Surgery : Do not apply any lotions/deodorants the morning of surgery.  Please wear clean clothes to the hospital/surgery center.  FAILURE TO FOLLOW THESE INSTRUCTIONS MAY RESULT IN THE CANCELLATION OF YOUR SURGERY  PATIENT SIGNATURE_________________________________  NURSE SIGNATURE__________________________________  ________________________________________________________________________   Tara [name]  An incentive spirometer is a tool that can help keep your lungs clear and active. This tool measures how well you are filling your lungs with each breath. Taking long deep breaths may help reverse or decrease the chance of developing breathing (pulmonary) problems (especially infection) following: A long period of time when you are unable to move or be active. BEFORE THE PROCEDURE  If the spirometer includes an indicator to show your best effort, your nurse or respiratory therapist will set it to a desired goal. If possible, sit up straight or lean slightly forward. Try not to slouch. Hold the incentive spirometer in an upright  position. INSTRUCTIONS FOR USE  Sit on the edge of your bed if possible, or sit up as far as you can in bed or on a chair. Hold the incentive spirometer in an upright position. Breathe out normally. Place the mouthpiece in your mouth and seal your lips tightly around it. Breathe in slowly and as deeply as possible, raising the piston or the ball toward the top of the column. Hold your breath for 3-5 seconds or for as long as possible. Allow the piston or ball to fall to the bottom of the column. Remove the mouthpiece from your mouth and breathe out normally. Rest for a few seconds and repeat Steps 1 through 7 at least 10 times every 1-2 hours when you are awake. Take your time and take a few normal breaths between deep breaths. The spirometer may include an indicator to show your best effort. Use the indicator as a goal to work toward during each repetition. After each set of 10 deep breaths, practice coughing to be sure your lungs are clear. If you have an incision (the cut made at the time of surgery), support your incision when coughing by placing a pillow or rolled up towels firmly against it. Once you are able to get out of bed, walk around indoors and cough well. You may stop using the incentive spirometer when instructed by your caregiver.  RISKS AND COMPLICATIONS Take your time so you do not get dizzy or light-headed. If you are in pain, you may need to take or ask for pain medication before doing incentive spirometry. It is harder to take a deep breath if you are having pain. AFTER USE Rest and breathe slowly and easily. It can be helpful to keep track of a log of your progress. Your caregiver can provide you with a simple table to help with this. If you are using the spirometer at home, follow these instructions: [place] IF:  You are having difficultly using the spirometer. You have trouble using the spirometer as often as instructed. Your pain medication is not  giving  enough relief while using the spirometer. You develop fever of 100.5 F (38.1 C) or higher. SEEK IMMEDIATE MEDICAL CARE IF:  You cough up bloody sputum that had not been present before. You develop fever of 102 F (38.9 C) or greater. You develop worsening pain at or near the incision site. MAKE SURE YOU:  Understand these instructions. Will watch your condition. Will get help right away if you are not doing well or get worse. Document Released: 06/18/2006 Document Revised: 04/30/2011 Document Reviewed: 08/19/2006 ExitCare Patient Information 2014 [employer].   ________________________________________________________________________  [facility]- Preparing for Total Shoulder Arthroplasty    Before surgery, you can play an important role. Because skin is not sterile, your skin needs to be as free of germs as possible. You can reduce the number of germs on your skin by using the following products. Benzoyl Peroxide Gel Reduces the number of germs present on the skin Applied twice a day to shoulder area starting two days before surgery    ==================================================================  Please follow these instructions carefully:  BENZOYL PEROXIDE 5% GEL  Please do not use if you have an allergy to benzoyl peroxide.   If your skin becomes reddened/irritated stop using the benzoyl peroxide.  Starting two days before surgery, apply as follows: Apply benzoyl peroxide in the morning and at night. Apply after taking a shower. If you are not taking a shower clean entire shoulder front, back, and side along with the armpit with a clean wet washcloth.  Place a quarter-sized dollop on your shoulder and rub in thoroughly, making sure to cover the front, back, and side of your shoulder, along with the armpit.   2 days before ____ AM   ____ PM              1 day before ____ AM   ____ PM                         Do this twice a day for two days.  (Last application is the  night before surgery, AFTER using the CHG soap as described below).  Do NOT apply benzoyl peroxide gel on the day of surgery.

## 2021-02-17 NOTE — Progress Notes (Addendum)
COVID swab appointment: n/a  COVID Vaccine Completed: yes x3  Date COVID Vaccine completed: 02/26/19, 03/19/19 Has received booster: 12/11/19 COVID vaccine manufacturer: Pfizer      Date of COVID positive in last 90 days: no  PCP - Advice worker, NP Cardiologist - n/a  Chest x-ray - n/a EKG - n/a Stress Test - yes about 10 years ago per pt ECHO - 2012 Cardiac Cath - n/a Pacemaker/ICD device last checked: n/a Spinal Cord Stimulator: n/a  Sleep Study - n/a CPAP -   Fasting Blood Sugar - n/a Checks Blood Sugar _____ times a day  Blood Thinner Instructions: n/a Aspirin Instructions: Last Dose:  Activity level: Can go up a flight of stairs and perform activities of daily living without stopping and without symptoms of chest pain or shortness of breath.    Anesthesia review:   Patient denies shortness of breath, fever, cough and chest pain at PAT appointment   Patient verbalized understanding of instructions that were given to them at the PAT appointment. Patient was also instructed that they will need to review over the PAT instructions again at home before surgery.

## 2021-02-21 ENCOUNTER — Encounter (HOSPITAL_COMMUNITY): Payer: Self-pay

## 2021-02-21 ENCOUNTER — Other Ambulatory Visit: Payer: Self-pay

## 2021-02-21 ENCOUNTER — Ambulatory Visit: Payer: 59 | Admitting: Family Medicine

## 2021-02-21 ENCOUNTER — Encounter (HOSPITAL_COMMUNITY)
Admission: RE | Admit: 2021-02-21 | Discharge: 2021-02-21 | Disposition: A | Discharge status: Home / Self Care | Payer: 59 | Source: Ambulatory Visit | Attending: Orthopedic Surgery | Admitting: Orthopedic Surgery

## 2021-02-21 VITALS — HR 59 | Temp 97.7°F | Ht 70.0 in | Wt 140.5 lb

## 2021-02-21 DIAGNOSIS — Z01812 Encounter for preprocedural laboratory examination: Secondary | ICD-10-CM | POA: Diagnosis not present

## 2021-02-21 DIAGNOSIS — D649 Anemia, unspecified: Secondary | ICD-10-CM | POA: Insufficient documentation

## 2021-02-21 DIAGNOSIS — Z01818 Encounter for other preprocedural examination: Secondary | ICD-10-CM

## 2021-02-21 LAB — CBC
HCT: 42.4 % (ref 36.0–46.0)
Hemoglobin: 14.2 g/dL (ref 12.0–15.0)
MCH: 33.4 pg (ref 26.0–34.0)
MCHC: 33.5 g/dL (ref 30.0–36.0)
MCV: 99.8 fL (ref 80.0–100.0)
Platelets: 188 10*3/uL (ref 150–400)
RBC: 4.25 MIL/uL (ref 3.87–5.11)
RDW: 12.5 % (ref 11.5–15.5)
WBC: 5.3 10*3/uL (ref 4.0–10.5)
nRBC: 0 % (ref 0.0–0.2)

## 2021-02-21 LAB — SURGICAL PCR SCREEN
MRSA, PCR: NEGATIVE
Staphylococcus aureus: NEGATIVE

## 2021-02-22 NOTE — Anesthesia Preprocedure Evaluation (Addendum)
Anesthesia Evaluation  Patient identified by MRN, date of birth, ID band Patient awake    Reviewed: Allergy & Precautions, NPO status , Patient's Chart, lab work & pertinent test results  History of Anesthesia Complications (+) PONV  Airway Mallampati: I  TM Distance: >3 FB Neck ROM: Full    Dental no notable dental hx. (+) Teeth Intact, Dental Advisory Given   Pulmonary neg pulmonary ROS,    Pulmonary exam normal breath sounds clear to auscultation       Cardiovascular Normal cardiovascular exam+ Valvular Problems/Murmurs (mild/mod MR, MVP) MR  Rhythm:Regular Rate:Normal  HLD  TTE 2012 - Left ventricle: The cavity size was normal. Wall thickness was   normal. Systolic function was normal. The estimated ejection   fraction was in the range of 60% to 65%. Wall motion was normal;   there were no regional wall motion abnormalities.  - Mitral valve: Prolapse. Mild , involving the posterior leaflet.   Mild to moderate regurgitation.  - Atrial septum: No defect or patent foramen ovale was identified.        Neuro/Psych negative neurological ROS  negative psych ROS   GI/Hepatic negative GI ROS, Neg liver ROS,   Endo/Other  negative endocrine ROS  Renal/GU negative Renal ROS  negative genitourinary   Musculoskeletal  (+) Arthritis ,   Abdominal   Peds  Hematology negative hematology ROS (+)   Anesthesia Other Findings   Reproductive/Obstetrics                            Anesthesia Physical Anesthesia Plan  ASA: 2  Anesthesia Plan: General and Regional   Post-op Pain Management: Regional block and Tylenol PO (pre-op)   Induction: Intravenous  PONV Risk Score and Plan: 4 or greater and Midazolam, Dexamethasone, Ondansetron, TIVA and Scopolamine patch - Pre-op  Airway Management Planned: Oral ETT  Additional Equipment:   Intra-op Plan:   Post-operative Plan:  Extubation in OR  Informed Consent: I have reviewed the patients History and Physical, chart, labs and discussed the procedure including the risks, benefits and alternatives for the proposed anesthesia with the patient or authorized representative who has indicated his/her understanding and acceptance.     Dental advisory given  Plan Discussed with: CRNA  Anesthesia Plan Comments:        Anesthesia Quick Evaluation

## 2021-02-23 ENCOUNTER — Encounter (HOSPITAL_COMMUNITY)
Admission: RE | Disposition: A | Discharge status: Home / Self Care | Payer: Self-pay | Source: Ambulatory Visit | Attending: Orthopedic Surgery

## 2021-02-23 ENCOUNTER — Ambulatory Visit (HOSPITAL_COMMUNITY): Payer: 59 | Admitting: Anesthesiology

## 2021-02-23 ENCOUNTER — Ambulatory Visit (HOSPITAL_COMMUNITY)
Admission: RE | Admit: 2021-02-23 | Discharge: 2021-02-23 | Disposition: A | Discharge status: Home / Self Care | Payer: 59 | Source: Ambulatory Visit | Attending: Orthopedic Surgery | Admitting: Orthopedic Surgery

## 2021-02-23 ENCOUNTER — Encounter (HOSPITAL_COMMUNITY): Payer: Self-pay | Admitting: Orthopedic Surgery

## 2021-02-23 DIAGNOSIS — M19012 Primary osteoarthritis, left shoulder: Secondary | ICD-10-CM | POA: Diagnosis not present

## 2021-02-23 DIAGNOSIS — G8918 Other acute postprocedural pain: Secondary | ICD-10-CM | POA: Diagnosis not present

## 2021-02-23 HISTORY — PX: TOTAL SHOULDER ARTHROPLASTY: SHX126

## 2021-02-23 SURGERY — ARTHROPLASTY, SHOULDER, TOTAL
Anesthesia: Regional | Site: Shoulder | Laterality: Left

## 2021-02-23 MED ORDER — FENTANYL CITRATE PF 50 MCG/ML IJ SOSY: 25.0000 ug | PREFILLED_SYRINGE | INTRAMUSCULAR | Status: DC | PRN

## 2021-02-23 MED ORDER — TRANEXAMIC ACID 1000 MG/10ML IV SOLN: 1000.0000 mg | INTRAVENOUS | Status: DC

## 2021-02-23 MED ORDER — ORAL CARE MOUTH RINSE: 15.0000 mL | Freq: Once | OROMUCOSAL | Status: AC

## 2021-02-23 MED ORDER — ROCURONIUM BROMIDE 10 MG/ML (PF) SYRINGE
PREFILLED_SYRINGE | INTRAVENOUS | Status: AC
  Filled 2021-02-23: qty 10

## 2021-02-23 MED ORDER — BUPIVACAINE LIPOSOME 1.3 % IJ SUSP
INTRAMUSCULAR | Status: DC | PRN
  Administered 2021-02-23: 10 mL via PERINEURAL

## 2021-02-23 MED ORDER — 0.9 % SODIUM CHLORIDE (POUR BTL) OPTIME
TOPICAL | Status: DC | PRN
  Administered 2021-02-23: 1000 mL

## 2021-02-23 MED ORDER — PROPOFOL 10 MG/ML IV BOLUS
INTRAVENOUS | Status: AC
  Filled 2021-02-23: qty 20

## 2021-02-23 MED ORDER — VANCOMYCIN HCL 1000 MG IV SOLR
INTRAVENOUS | Status: AC
  Filled 2021-02-23: qty 20

## 2021-02-23 MED ORDER — LIDOCAINE HCL (CARDIAC) PF 100 MG/5ML IV SOSY
PREFILLED_SYRINGE | INTRAVENOUS | Status: DC | PRN
  Administered 2021-02-23: 20 mg via INTRAVENOUS

## 2021-02-23 MED ORDER — MIDAZOLAM HCL 2 MG/2ML IJ SOLN
INTRAMUSCULAR | Status: DC | PRN
Start: 2021-02-23 — End: 2021-02-23
  Administered 2021-02-23: 2 mg via INTRAVENOUS

## 2021-02-23 MED ORDER — STERILE WATER FOR IRRIGATION IR SOLN
Status: DC | PRN
Start: 2021-02-23 — End: 2021-02-23
  Administered 2021-02-23: 2000 mL

## 2021-02-23 MED ORDER — FENTANYL CITRATE (PF) 100 MCG/2ML IJ SOLN
INTRAMUSCULAR | Status: DC | PRN
  Administered 2021-02-23: 50 ug via INTRAVENOUS

## 2021-02-23 MED ORDER — MIDAZOLAM HCL 2 MG/2ML IJ SOLN
INTRAMUSCULAR | Status: AC
  Filled 2021-02-23: qty 2

## 2021-02-23 MED ORDER — LACTATED RINGERS IV SOLN: INTRAVENOUS | Status: DC

## 2021-02-23 MED ORDER — SCOPOLAMINE 1 MG/3DAYS TD PT72
MEDICATED_PATCH | TRANSDERMAL | Status: DC | PRN
  Administered 2021-02-23: 1 via TRANSDERMAL

## 2021-02-23 MED ORDER — PHENYLEPHRINE HCL-NACL 20-0.9 MG/250ML-% IV SOLN
INTRAVENOUS | Status: AC
  Filled 2021-02-23: qty 250

## 2021-02-23 MED ORDER — LIDOCAINE HCL (PF) 2 % IJ SOLN
INTRAMUSCULAR | Status: AC
  Filled 2021-02-23: qty 5

## 2021-02-23 MED ORDER — CHLORHEXIDINE GLUCONATE 0.12 % MT SOLN
15.0000 mL | Freq: Once | OROMUCOSAL | Status: AC
  Administered 2021-02-23: 15 mL via OROMUCOSAL

## 2021-02-23 MED ORDER — DEXAMETHASONE SODIUM PHOSPHATE 10 MG/ML IJ SOLN
INTRAMUSCULAR | Status: AC
  Filled 2021-02-23: qty 1

## 2021-02-23 MED ORDER — CEFAZOLIN SODIUM-DEXTROSE 2-4 GM/100ML-% IV SOLN
2.0000 g | INTRAVENOUS | Status: AC
  Administered 2021-02-23: 2 g via INTRAVENOUS
  Filled 2021-02-23: qty 100

## 2021-02-23 MED ORDER — PROPOFOL 1000 MG/100ML IV EMUL
INTRAVENOUS | Status: AC
  Filled 2021-02-23: qty 100

## 2021-02-23 MED ORDER — VANCOMYCIN HCL 1000 MG IV SOLR
INTRAVENOUS | Status: DC | PRN
  Administered 2021-02-23: 1000 mg

## 2021-02-23 MED ORDER — TRANEXAMIC ACID-NACL 1000-0.7 MG/100ML-% IV SOLN
1000.0000 mg | INTRAVENOUS | Status: AC
Start: 2021-02-23 — End: 2021-02-23
  Administered 2021-02-23: 1000 mg via INTRAVENOUS
  Filled 2021-02-23: qty 100

## 2021-02-23 MED ORDER — SCOPOLAMINE 1 MG/3DAYS TD PT72
MEDICATED_PATCH | TRANSDERMAL | Status: AC
  Filled 2021-02-23: qty 1

## 2021-02-23 MED ORDER — FENTANYL CITRATE (PF) 100 MCG/2ML IJ SOLN
INTRAMUSCULAR | Status: AC
  Filled 2021-02-23: qty 2

## 2021-02-23 MED ORDER — ONDANSETRON HCL 4 MG/2ML IJ SOLN
INTRAMUSCULAR | Status: AC
  Filled 2021-02-23: qty 2

## 2021-02-23 MED ORDER — ACETAMINOPHEN 500 MG PO TABS
1000.0000 mg | ORAL_TABLET | Freq: Once | ORAL | Status: AC
  Administered 2021-02-23: 1000 mg via ORAL
  Filled 2021-02-23: qty 2

## 2021-02-23 MED ORDER — PHENYLEPHRINE HCL-NACL 20-0.9 MG/250ML-% IV SOLN
INTRAVENOUS | Status: DC | PRN
Start: 2021-02-23 — End: 2021-02-23
  Administered 2021-02-23: 25 ug/min via INTRAVENOUS

## 2021-02-23 MED ORDER — DEXAMETHASONE SODIUM PHOSPHATE 10 MG/ML IJ SOLN
INTRAMUSCULAR | Status: DC | PRN
  Administered 2021-02-23: 10 mg via INTRAVENOUS

## 2021-02-23 MED ORDER — PROPOFOL 500 MG/50ML IV EMUL
INTRAVENOUS | Status: DC | PRN
Start: 2021-02-23 — End: 2021-02-23
  Administered 2021-02-23: 120 ug/kg/min via INTRAVENOUS

## 2021-02-23 MED ORDER — ROCURONIUM BROMIDE 10 MG/ML (PF) SYRINGE
PREFILLED_SYRINGE | INTRAVENOUS | Status: DC | PRN
  Administered 2021-02-23: 20 mg via INTRAVENOUS
  Administered 2021-02-23: 60 mg via INTRAVENOUS

## 2021-02-23 MED ORDER — PROPOFOL 10 MG/ML IV BOLUS
INTRAVENOUS | Status: DC | PRN
  Administered 2021-02-23: 100 mg via INTRAVENOUS
  Administered 2021-02-23 (×5): 20 mg via INTRAVENOUS

## 2021-02-23 MED ORDER — BUPIVACAINE HCL (PF) 0.5 % IJ SOLN
INTRAMUSCULAR | Status: DC | PRN
  Administered 2021-02-23: 15 mL via PERINEURAL

## 2021-02-23 MED ORDER — EPHEDRINE SULFATE-NACL 50-0.9 MG/10ML-% IV SOSY
PREFILLED_SYRINGE | INTRAVENOUS | Status: DC | PRN
  Administered 2021-02-23 (×3): 5 mg via INTRAVENOUS

## 2021-02-23 MED ORDER — LACTATED RINGERS IV BOLUS
250.0000 mL | Freq: Once | INTRAVENOUS | Status: AC
  Administered 2021-02-23: 250 mL via INTRAVENOUS

## 2021-02-23 MED ORDER — SUGAMMADEX SODIUM 200 MG/2ML IV SOLN
INTRAVENOUS | Status: DC | PRN
  Administered 2021-02-23: 130 mg via INTRAVENOUS

## 2021-02-23 MED ORDER — LACTATED RINGERS IV BOLUS
500.0000 mL | Freq: Once | INTRAVENOUS | Status: AC
  Administered 2021-02-23: 500 mL via INTRAVENOUS

## 2021-02-23 MED ORDER — ONDANSETRON HCL 4 MG/2ML IJ SOLN
INTRAMUSCULAR | Status: DC | PRN
  Administered 2021-02-23: 4 mg via INTRAVENOUS

## 2021-02-23 SURGICAL SUPPLY — 73 items
ADH SKN CLS APL DERMABOND .7 (GAUZE/BANDAGES/DRESSINGS) ×1
AID PSTN UNV HD RSTRNT DISP (MISCELLANEOUS) ×1
BAG COUNTER SPONGE SURGICOUNT (BAG) IMPLANT
BAG SPEC THK2 15X12 ZIP CLS (MISCELLANEOUS) ×1
BAG SPNG CNTER NS LX DISP (BAG)
BAG ZIPLOCK 12X15 (MISCELLANEOUS) ×3 IMPLANT
BIT DRILL 2.0X128 (BIT) IMPLANT
BLADE SAW SGTL 83.5X18.5 (BLADE) ×3 IMPLANT
BODY TRUNION ECLIPSE 41 SL (Shoulder) ×1 IMPLANT
CEMENT BONE DEPUY (Cement) ×3 IMPLANT
COOLER ICEMAN CLASSIC (MISCELLANEOUS) IMPLANT
COVER BACK TABLE 60X90IN (DRAPES) ×3 IMPLANT
COVER SURGICAL LIGHT HANDLE (MISCELLANEOUS) ×3 IMPLANT
DERMABOND ADVANCED (GAUZE/BANDAGES/DRESSINGS) ×1
DERMABOND ADVANCED .7 DNX12 (GAUZE/BANDAGES/DRESSINGS) ×2 IMPLANT
DRAPE ORTHO SPLIT 77X108 STRL (DRAPES) ×4
DRAPE SHEET LG 3/4 BI-LAMINATE (DRAPES) ×3 IMPLANT
DRAPE SURG 17X11 SM STRL (DRAPES) ×3 IMPLANT
DRAPE SURG ORHT 6 SPLT 77X108 (DRAPES) ×4 IMPLANT
DRAPE TOP 10253 STERILE (DRAPES) ×3 IMPLANT
DRAPE U-SHAPE 47X51 STRL (DRAPES) ×3 IMPLANT
DRSG AQUACEL AG ADV 3.5X 6 (GAUZE/BANDAGES/DRESSINGS) ×1 IMPLANT
DRSG AQUACEL AG ADV 3.5X10 (GAUZE/BANDAGES/DRESSINGS) ×2 IMPLANT
DRSG TEGADERM 8X12 (GAUZE/BANDAGES/DRESSINGS) ×1 IMPLANT
DURAPREP 26ML APPLICATOR (WOUND CARE) ×3 IMPLANT
ELECT BLADE TIP CTD 4 INCH (ELECTRODE) ×3 IMPLANT
ELECT REM PT RETURN 15FT ADLT (MISCELLANEOUS) ×3 IMPLANT
FACESHIELD WRAPAROUND (MASK) ×8 IMPLANT
FACESHIELD WRAPAROUND OR TEAM (MASK) ×8 IMPLANT
GLENOID WITH CLEAT SM (Miscellaneous) ×1 IMPLANT
GLOVE SRG 8 PF TXTR STRL LF DI (GLOVE) ×2 IMPLANT
GLOVE SURG ENC MOIS LTX SZ7 (GLOVE) ×3 IMPLANT
GLOVE SURG ENC MOIS LTX SZ7.5 (GLOVE) ×3 IMPLANT
GLOVE SURG ENC MOIS LTX SZ8 (GLOVE) ×3 IMPLANT
GLOVE SURG UNDER POLY LF SZ7 (GLOVE) ×3 IMPLANT
GLOVE SURG UNDER POLY LF SZ8 (GLOVE) ×2
GOWN STRL REUS W/TWL LRG LVL3 (GOWN DISPOSABLE) ×6 IMPLANT
HEAD HUMERAL ECLIPSE 41/18 (Shoulder) ×1 IMPLANT
IMPL ECLIPSE SPEEDCAP (Shoulder) IMPLANT
IMPLANT ECLIPSE SPEEDCAP (Shoulder) ×2 IMPLANT
KIT BASIN OR (CUSTOM PROCEDURE TRAY) ×3 IMPLANT
KIT TURNOVER KIT A (KITS) IMPLANT
MANIFOLD NEPTUNE II (INSTRUMENTS) ×3 IMPLANT
NDL TAPERED W/ NITINOL LOOP (MISCELLANEOUS) IMPLANT
NEEDLE TAPERED W/ NITINOL LOOP (MISCELLANEOUS) ×2 IMPLANT
NS IRRIG 1000ML POUR BTL (IV SOLUTION) ×3 IMPLANT
PACK SHOULDER (CUSTOM PROCEDURE TRAY) ×3 IMPLANT
PAD COLD SHLDR WRAP-ON (PAD) IMPLANT
PIN NITINOL TARGETER 2.8 (PIN) IMPLANT
PIN SET MODULAR GLENOID SYSTEM (PIN) ×1 IMPLANT
PROTECTOR NERVE ULNAR (MISCELLANEOUS) ×3 IMPLANT
RESTRAINT HEAD UNIVERSAL NS (MISCELLANEOUS) ×3 IMPLANT
SCREW MED ECLIPSE 35 (Screw) ×1 IMPLANT
SIZER ECLIPSE CAGE SCREW (ORTHOPEDIC DISPOSABLE SUPPLIES) ×1 IMPLANT
SLING ARM FOAM STRAP LRG (SOFTGOODS) IMPLANT
SLING ARM FOAM STRAP MED (SOFTGOODS) IMPLANT
SMARTMIX MINI TOWER (MISCELLANEOUS)
SPONGE T-LAP 18X18 ~~LOC~~+RFID (SPONGE) IMPLANT
SPONGE T-LAP 4X18 ~~LOC~~+RFID (SPONGE) ×2 IMPLANT
SUCTION FRAZIER HANDLE 12FR (TUBING) ×2
SUCTION TUBE FRAZIER 12FR DISP (TUBING) ×2 IMPLANT
SUT FIBERWIRE #2 38 T-5 BLUE (SUTURE)
SUT MNCRL AB 3-0 PS2 18 (SUTURE) ×3 IMPLANT
SUT MON AB 2-0 CT1 36 (SUTURE) ×3 IMPLANT
SUT VIC AB 1 CT1 36 (SUTURE) ×3 IMPLANT
SUTURE FIBERWR #2 38 T-5 BLUE (SUTURE) IMPLANT
SUTURE TAPE 1.3 40 TPR END (SUTURE) IMPLANT
SUTURETAPE 1.3 40 TPR END (SUTURE) ×4
TOWEL OR 17X26 10 PK STRL BLUE (TOWEL DISPOSABLE) ×3 IMPLANT
TOWEL OR NON WOVEN STRL DISP B (DISPOSABLE) ×3 IMPLANT
TOWER SMARTMIX MINI (MISCELLANEOUS) IMPLANT
WATER STERILE IRR 1000ML POUR (IV SOLUTION) ×6 IMPLANT
YANKAUER SUCT BULB TIP 10FT TU (MISCELLANEOUS) ×3 IMPLANT

## 2021-02-23 NOTE — Transfer of Care (Signed)
Immediate Anesthesia Transfer of Care Note  Patient: Tara [name]  Procedure(s) Performed: TOTAL SHOULDER ARTHROPLASTY (Left: Shoulder)  Patient Location: PACU  Anesthesia Type:General  Level of Consciousness: awake, alert , oriented, drowsy and patient cooperative  Airway & Oxygen Therapy: Patient Spontanous Breathing and Patient connected to face mask oxygen  Post-op Assessment: Report given to RN and Post -op Vital signs reviewed and stable  Post vital signs: Reviewed and stable  Last Vitals:  Vitals Value Taken Time  BP 104/69 02/23/21 0938  Temp    Pulse 61 02/23/21 0941  Resp 20 02/23/21 0941  SpO2 100 % 02/23/21 0941  Vitals shown include unvalidated device data.  Last Pain:  Vitals:   02/23/21 0557  TempSrc: Oral  PainSc:       Patients Stated Pain Goal: 3 (95/63/87 [id number])  Complications: No notable events documented.

## 2021-02-23 NOTE — Anesthesia Procedure Notes (Signed)
Anesthesia Regional Block: Interscalene brachial plexus block   Pre-Anesthetic Checklist: , timeout performed,  Correct Patient, Correct Site, Correct Laterality,  Correct Procedure, Correct Position, site marked,  Risks and benefits discussed,  Surgical consent,  Pre-op evaluation,  At surgeon's request and post-op pain management  Laterality: Left  Prep: Maximum Sterile Barrier Precautions used, chloraprep       Needles:  Injection technique: Single-shot  Needle Type: Echogenic Stimulator Needle     Needle Length: 5cm  Needle Gauge: 22     Additional Needles:   Procedures:,,,, ultrasound used (permanent image in chart),,    Narrative:  Start time: 02/23/2021 7:14 AM End time: 02/23/2021 7:18 AM Injection made incrementally with aspirations every 5 mL.  Performed by: Personally  Anesthesiologist: Freddrick March, MD  Additional Notes: Monitors applied. No increased pain on injection. No increased resistance to injection. Injection made in 5cc increments. Good needle visualization. Patient tolerated procedure well.

## 2021-02-23 NOTE — Anesthesia Procedure Notes (Signed)
Procedure Name: Intubation Date/Time: 02/23/2021 7:41 AM Performed by: Raenette Rover, CRNA Pre-anesthesia Checklist: Patient identified, Emergency Drugs available, Suction available and Patient being monitored Patient Re-evaluated:Patient Re-evaluated prior to induction Oxygen Delivery Method: Circle system utilized Preoxygenation: Pre-oxygenation with 100% oxygen Induction Type: IV induction Ventilation: Mask ventilation without difficulty Laryngoscope Size: Mac and 3 Grade View: Grade I Tube type: Oral Tube size: 7.0 mm Number of attempts: 1 Airway Equipment and Method: Stylet Placement Confirmation: ETT inserted through vocal cords under direct vision, positive ETCO2 and breath sounds checked- equal and bilateral Secured at: 20 cm Tube secured with: Tape Dental Injury: Teeth and Oropharynx as per pre-operative assessment

## 2021-02-23 NOTE — Anesthesia Postprocedure Evaluation (Signed)
Anesthesia Post Note  Patient: Tara [name]  Procedure(s) Performed: TOTAL SHOULDER ARTHROPLASTY (Left: Shoulder)     Patient location during evaluation: PACU Anesthesia Type: Regional and General Level of consciousness: awake and alert Pain management: pain level controlled Vital Signs Assessment: post-procedure vital signs reviewed and stable Respiratory status: spontaneous breathing, nonlabored ventilation, respiratory function stable and patient connected to nasal cannula oxygen Cardiovascular status: blood pressure returned to baseline and stable Postop Assessment: no apparent nausea or vomiting Anesthetic complications: no   No notable events documented.  Last Vitals:  Vitals:   02/23/21 1000 02/23/21 1011  BP: 109/75 106/76  Pulse: (!) 54 (!) 48  Resp: 14 10  Temp: (!) 36.4 C   SpO2: 100% 98%    Last Pain:  Vitals:   02/23/21 1011  TempSrc:   PainSc: 0-No pain                 Lucille Crichlow L Epifania Littrell

## 2021-02-23 NOTE — H&P (Signed)
Tara [name]    Chief Complaint: Left shoulder osteoarthritis HPI: The patient is a 61 y.o. female well-known to our practice with a long history of severe left shoulder pain related to osteoarthritis.  Due to her increasing functional imitations and failure to respond to prolonged attempts at conservative management, she is brought to the operating room at this time for planned left shoulder anatomic arthroplasty  Past Medical History:  Diagnosis Date   Anemia    Arthritis    Hyperlipidemia    Insomnia    Mitral valve prolapse    PONV (postoperative nausea and vomiting)     Past Surgical History:  Procedure Laterality Date   AUGMENTATION MAMMAPLASTY Bilateral    Dec 2019   BREAST BIOPSY Right    axillary node benign   KNEE SURGERY  1988   right   SHOULDER ARTHROSCOPY WITH LABRAL REPAIR Right 08/28/2012   Procedure: RIGHT SHOULDER ARTHROSCOPY WITH LABRAL DEBRIDEMENT, ASPIRATION AND DECOMPRESSION OF PARALABRAL CYST;  Surgeon: Marin Shutter, MD;  Location: [place];  Service: Orthopedics;  Laterality: Right;   TOTAL SHOULDER ARTHROPLASTY Right 03/24/2020   Procedure: RIGHT REVERSE SHOULDER ARTHROPLASTY;  Surgeon: Justice Britain, MD;  Location: [facility];  Service: Orthopedics;  Laterality: Right;  175mn    Family History  Problem Relation Age of Onset   Stroke Mother    Sudden death Neg Hx    Hypertension Neg Hx    Hyperlipidemia Neg Hx    Heart attack Neg Hx    Diabetes Neg Hx    Breast cancer Neg Hx     Social History:  reports that she has never smoked. She has never used smokeless tobacco. She reports that she does not drink alcohol and does not use drugs.   Medications Prior to Admission  Medication Sig Dispense Refill   CALCIUM CITRATE PO Take 600 mg by mouth 2 (two) times daily. Number varies each day based on calcium ingested in food     cetirizine (ZYRTEC) 10 MG tablet Take 10 mg by mouth daily as needed for allergies or rhinitis.     Cholecalciferol (VITAMIN D3)  50 MCG (2000 UT) TABS Take 2,000 Units by mouth daily.     Efinaconazole 10 % SOLN Apply 1 drop topically daily. (Patient taking differently: Apply 1 drop topically daily as needed (Toe Nail).) 4 mL 11   Fluocin-Hydroquinone-Tretinoin (TRI-LUMA EX) Apply 1 application topically daily as needed (Breakout on face).     Fluocin-Hydroquinone-Tretinoin 0.01-4-0.05 % CREA Apply to affeced areas or dark spots once daily. (Patient taking differently: Apply 1 application topically daily as needed (face).) 30 g 3   fluticasone (FLONASE) 50 MCG/ACT nasal spray Place 1 spray into both nostrils 3 (three) times daily as needed for allergies.     HYDROmorphone (DILAUDID) 2 MG tablet Take 1 tablet (2 mg total) by mouth every 6 (six) hours as needed for severe post op pain 12 tablet 0   Multiple Vitamin (MULTIVITAMIN WITH MINERALS) TABS Take 1 tablet by mouth daily.     Omega-3 Fatty Acids (FISH OIL) 1000 MG CAPS Take 1,000 mg by mouth 2 (two) times daily.     OVER THE COUNTER MEDICATION Take 1-2 tablets by mouth at bedtime. Formula 303  supplement for pain and rest     Red Yeast Rice 600 MG CAPS Take 600 mg by mouth 2 (two) times daily.     Tart Cherry (TART CHERRY ULTRA) 1200 MG CAPS Take 1,200 mg by mouth 3 (three)  times daily.     COVID-19 At-Home Test KIT as directed 2 kit 0   cyclobenzaprine (FLEXERIL) 10 MG tablet Take 1 tablet (10 mg total) by mouth every 6-8 hours as needed for spasm 30 tablet 0   meloxicam (MOBIC) 15 MG tablet Take 1 tablet (15 mg total) by mouth daily. 30 tablet 1   meloxicam (MOBIC) 7.5 MG tablet Take 1 tablet (7.5 mg total) by mouth daily. (Patient not taking: Reported on 02/14/2021) 30 tablet 0   ondansetron (ZOFRAN) 4 MG tablet Take 1 tablet (4 mg total) by mouth every 6-8 hours as needed for post op nausea 10 tablet 0   traMADol (ULTRAM) 50 MG tablet Take 1 tablet (50 mg total) by mouth every 4-6 hours as needed for moderate pain 20 tablet 0   tretinoin (RETIN-A) 0.05 % cream Apply  1 application topically at bedtime. (Patient not taking: Reported on 02/14/2021) 45 g 3     Physical Exam: Left shoulder demonstrates painful and guarded motion as noted at her recent office visits.  She maintains excellent strength to manual muscle testing.  Neurovascular intact in left upper extremity.  Plain radiographs confirm severe osteoarthritis with marked bony deformity, subchondral sclerosis, and complete obliteration of the joint space.  Vitals  Temp:  [98.7 F (37.1 C)] 98.7 F (37.1 C) (01/05 0557) Pulse Rate:  [57] 57 (01/05 0557) Resp:  [12] 12 (01/05 0557) BP: (102)/(74) 102/74 (01/05 0557) SpO2:  [100 %] 100 % (01/05 0557) Weight:  [63.7 kg] 63.7 kg (01/05 0538)  Assessment/Plan  Impression: Left shoulder osteoarthritis  Plan of Action: Procedure(s): TOTAL SHOULDER ARTHROPLASTY  Tara [name] 02/23/2021, 6:27 AM Contact # [phone]

## 2021-02-23 NOTE — Op Note (Signed)
02/23/2021  9:27 AM  PATIENT:   Tara [name]  61 y.o. female  PRE-OPERATIVE DIAGNOSIS:  Left shoulder osteoarthritis  POST-OPERATIVE DIAGNOSIS: Same  PROCEDURE: Left shoulder stemless anatomic arthroplasty utilizing a size 49 Arthrex trunnion with a medium cage screw, 49 x 18 humeral head, small glenoid  SURGEON:  Dalynn Jhaveri, Metta Clines M.D.  ASSISTANTS: Jenetta Loges, PA-C  ANESTHESIA:   General endotracheal and interscalene block with Exparel  EBL: 150 cc  SPECIMEN: None  Drains: None   PATIENT DISPOSITION:  PACU - hemodynamically stable.    PLAN OF CARE: Admit for overnight observation  Brief history:  Patient is a 61 year old female with a long history of severe bilateral shoulder arthritis, status post recent right shoulder reverse arthroplasty due to her severe bony deformity.  Now presenting for planned left shoulder anatomic arthroplasty related to severe osteoarthritis and associated functional limitations which have been refractory to prolonged attempts at conservative management.  Preoperatively, I counseled the patient regarding treatment options and risks versus benefits thereof.  Possible surgical complications were all reviewed including potential for bleeding, infection, neurovascular injury, persistent pain, loss of motion, anesthetic complication, failure of the implant, and possible need for additional surgery. They understand and accept and agrees with our planned procedure.   Procedure in detail:  After undergoing routine preop evaluation the patient received prophylactic antibiotics and interscalene block with Exparel was established in the holding area by the anesthesia department.  Subsequently placed supine on the operating table and underwent the smooth induction of a general endotracheal anesthesia.  Placed into the beachchair position and appropriately padded and protected.  The left shoulder girdle region was sterilely prepped and draped in standard  fashion.  Timeout was called.  A deltopectoral approach to the left shoulder was made in approximately a centimeter incision.  Skin flaps were elevated dissection carried deeply the deltopectoral interval was developed from proximal to distal with the vein taken laterally.  Conjoined tendon mobilized and retracted medially.  The long head biceps tendon was then tenodesed at the upper border of the pectoralis major tendon with proximal segment unroofed and excised.  The rotator cuff was then split along the rotator interval from the apex of the bicipital groove to the base of the coracoid and the insertion of the subscapularis was identified superiorly and inferiorly at its lesser tuberosity insertion and an oscillating saw was then used to perform a lesser tuberosity osteotomy removing a thin wafer of bone.  The subscap was intact and then capsular attachments were then divided from the anterior and infra margins of the humeral neck allowing deliver the humeral head through the wound.  An extra medullary guide was then used to outline a proposed humeral head resection which was performed at approximately 30 degrees retroversion with care taken to protect the surrounding soft tissues.  A rondure was then used to remove the large marginal osteophytes from the humeral metaphysis.  The humeral metaphysis was then prepared after being sized for 49 trunnion.  Measured for a medium cage screw.  A metal cap was then placed over the cut proximal humeral surface and we then exposed the glenoid with appropriate retractors.  A circumferential labral resection was performed gaining complete visualization.  For the glenoid.  Some mild glenoid retroversion with posterior glenoid erosion was noted.  Size small glenoid was selected and a guidepin was then directed into the center of the glenoid and was reamed to a stable subchondral bony bed.  Preparation completed with central drill  followed by the guide for the placement of the  superior and inferior peg and slot respectively and then it was then broached and the trial glenoid showed excellent fit and fixation.  Glenoid was irrigated cleaned and dried.  The glenoid was impacted with cement and the superior and inferior peg Respectively and bone graft placed about the central peg and the glenoid was then impacted with excellent fit and fixation.  We then returned our attention to the proximal humerus where the size 49 trunnion was positioned over the centering guide and a suture tape was passed through the eyelid on the color of the trunnion and the medium cage screw was then packed with bone graft harvested from the humeral head and the cage screw was then seated with good purchase and fixation.  We then performed a series of trial reductions and ultimately felt that the 49 x 18 head gave [place] the best motion stability and soft tissue balance with approximately 50% translation of the humeral head of the glenoid.  The trial was then removed.  We placed 2 additional medial row anchors for repair of the LTO and we then cleaned the trunnion and impacted the final 4918 head and a final reduction which again showed good motion good stability good soft tissue balance.  We then confirmed the subscapularis had appropriate elasticity.  Our medial row suture limbs x3 were passed through the bone tendon junction of the LTO.  The suture limbs were then passed into 2 lateral anchors after we had placed a convergence stitch at the upper border the subscap with the anterior supraspinatus.  Once positioning the subscap was determined the LTO was repaired using a double row technique with excellent purchase and fixation and easily allowing 30 degrees of external Tat[place] with the arm at the side.  The rotator interval was then repaired with a series of figure-of-eight suture tape sutures.  The wound was then copes irrigated.  Final hemostasis was obtained.  Vancomycin powder was then spread liberally throughout  the deep soft tissue planes.  The deltopectoral interval was reapproximated with a series of figure-of-eight #1 Vicryl sutures.  2-0 Monocryl used to close the subcu layer and intracuticular 3 Monocryl the skin followed by Dermabond and Aquacel dressing.  The right arm was then placed into a sling and the patient was then awakened, extubated, and taken to the recovery room in stable condition.  Jenetta Loges, PA-C was utilized as an Environmental consultant throughout this case, essential for help with positioning the patient, positioning extremity, tissue manipulation, implantation of the prosthesis, suture management, wound closure, and intraoperative decision-making.  Marin Shutter MD   Contact # [phone]

## 2021-02-23 NOTE — Discharge Instructions (Signed)

## 2021-02-23 NOTE — Evaluation (Signed)
Occupational Therapy Evaluation Patient Details Name: Tara [name] MRN: [id number] DOB: 21-Sep-1960 Today's Date: 02/23/2021   History of Present Illness Patient is a 61 year old female admitted 2/3 for LT total shoulder arthroplasty. PMH includes shoulder arthroscopy and knee surgery, RT rTSA   Clinical Impression   Pt is a 61 year old female, s/p shoulder replacement without functional use of LT non-dominant upper extremity secondary to effects of surgery and interscalene block and shoulder precautions. Therapist provided education and instruction to patient and spouse in regards to exercises, precautions, positioning, donning upper extremity clothing and bathing while maintaining shoulder precautions, ice and edema management and donning/doffing sling. Patient and spouse verbalized understanding and demonstrated as needed. Patient needed assistance to donn shirt, underwear, pants, socks and shoes and provided with instruction on compensatory strategies to perform ADLs. Patient limited by decreased ROM in LT shoulder so therefore will need some form of assistance at home. Patient and spouse verbalized and/or demonstrated understanding to all instruction. Patient to follow up with MD for further therapy needs.        Recommendations for follow up therapy are one component of a multi-disciplinary discharge planning process, led by the attending physician.  Recommendations may be updated based on patient status, additional functional criteria and insurance authorization.   Follow Up Recommendations  Follow physician's recommendations for discharge plan and follow up therapies    Assistance Recommended at Discharge    Patient can return home with the following A little help with bathing/dressing/bathroom    Functional Status Assessment  Patient has had a recent decline in their functional status and demonstrates the ability to make significant improvements in function in a reasonable and  predictable amount of time.  Equipment Recommendations  None recommended by OT    Recommendations for Other Services       Precautions / Restrictions Precautions Precautions: Shoulder Shoulder Interventions: Shoulder sling/immobilizer;At all times;Off for dressing/bathing/exercises Precaution Booklet Issued: Yes (comment) Required Braces or Orthoses: Sling Restrictions Weight Bearing Restrictions: Yes LUE Weight Bearing: Non weight bearing      Mobility Bed Mobility Overal bed mobility: Modified Independent                  Transfers Overall transfer level: Independent                        Balance Overall balance assessment: History of Falls;Independent;Modified Independent                                         ADL either performed or assessed with clinical judgement   ADL Overall ADL's : Needs assistance/impaired Eating/Feeding: Modified independent   Grooming: Adhering to UE precautions;Modified independent   Upper Body Bathing: Supervision/ safety;Adhering to UE precautions;Standing;With caregiver independent assisting Upper Body Bathing Details (indicate cue type and reason): Able to demo dangle method in standing. Lower Body Bathing: Modified independent   Upper Body Dressing : Moderate assistance;Cueing for sequencing;Adhering to UE precautions;With caregiver independent assisting   Lower Body Dressing: Cueing for compensatory techniques;With caregiver independent assisting;Supervision/safety Lower Body Dressing Details (indicate cue type and reason): assist for tying shoes and donning socks with husband able. Toilet Transfer: Modified Dentist and Hygiene: Modified independent   Tub/ Banker: Supervision/safety;With caregiver independent assisting   Functional mobility during ADLs: Independent  Vision Baseline Vision/History: 0 No visual deficits       Perception      Praxis      Pertinent Vitals/Pain Pain Assessment: No/denies pain     Hand Dominance Right   Extremity/Trunk Assessment Upper Extremity Assessment Upper Extremity Assessment: RUE deficits/detail;LUE deficits/detail RUE Deficits / Details: Some limitations at shoulder due to still recovering from rTSA ~11 months prior, but functional RUE Sensation: WNL RUE Coordination: WNL LUE: Unable to fully assess due to immobilization       Cervical / Trunk Assessment Cervical / Trunk Assessment: Normal   Communication Communication Communication: No difficulties   Cognition Arousal/Alertness: Awake/alert Behavior During Therapy: WFL for tasks assessed/performed Overall Cognitive Status: Within Functional Limits for tasks assessed                                       General Comments       Exercises Shoulder Exercises Pendulum Exercise: PROM;Standing;Left;10 reps Elbow Flexion: AAROM;Standing;Left;10 reps Elbow Extension: 10 reps;Left;AAROM;Standing Wrist Flexion: AROM;Seated;10 reps;Left Wrist Extension: Left;AROM;10 reps;Seated Digit Composite Flexion: AROM;Seated;10 reps;Left Composite Extension: AROM;Seated;10 reps;Left Neck Flexion: AROM;10 reps Neck Extension: AROM;10 reps Neck Lateral Flexion - Right: AROM;10 reps Neck Lateral Flexion - Left: AROM;10 reps Hand Exercises Forearm Supination: AAROM;Seated;Left;10 reps Forearm Pronation: AAROM;Seated;Left;10 reps   Shoulder Instructions Shoulder Instructions Donning/doffing shirt without moving shoulder: Minimal assistance;Patient able to independently direct caregiver;Caregiver independent with task Method for sponge bathing under operated UE: Supervision/safety;Patient able to independently direct caregiver;Caregiver independent with task Donning/doffing sling/immobilizer: Minimal assistance;Patient able to independently direct caregiver;Caregiver independent with task Correct positioning of  sling/immobilizer: Independent;Patient able to independently direct caregiver;Caregiver independent with task Pendulum exercises (written home exercise program): Supervision/safety;Patient able to independently direct caregiver;Caregiver independent with task ROM for elbow, wrist and digits of operated UE: Minimal assistance;Patient able to independently direct caregiver;Caregiver independent with task Sling wearing schedule (on at all times/off for ADL's): Independent;Caregiver independent with task;Patient able to independently direct caregiver Proper positioning of operated UE when showering: Independent;Caregiver independent with task;Patient able to independently direct caregiver Dressing change: Caregiver independent with task;Patient able to independently direct caregiver;Minimal assistance Positioning of UE while sleeping: Independent;Caregiver independent with task;Patient able to independently direct caregiver    Home Living Family/patient expects to be discharged to:: Private residence Living Arrangements: Spouse/significant other Available Help at Discharge: Family Type of Home: House Home Access: Stairs to enter [employer] of Steps: 1 small step   Home Layout: Full bath on main level;Able to live on main level with bedroom/bathroom     Bathroom Shower/Tub: Occupational psychologist: Handicapped height     Home Equipment: None          Prior Functioning/Environment Prior Level of Function : Independent/Modified Independent;History of Falls (last six months) (1 fall while trail running)                        OT Problem List: Decreased range of motion;Decreased strength      OT Treatment/Interventions:      OT Goals(Current goals can be found in the care plan section) Acute Rehab OT Goals Patient Stated Goal: Return home today OT Goal Formulation: All assessment and education complete, DC therapy ADL Goals Additional ADL Goal #1: Pt  will demonstrate UE/LE dressing, donning/doffing of sling, correct positioning of LT UE, and compensatory strategies for LT axilla hygiene, all while  correctly following all shoulder post-op precautions/restrictions.  OT Frequency:      Co-evaluation              AM-PAC OT "6 Clicks" Daily Activity     Outcome Measure Help from another person eating meals?: None Help from another person taking care of personal grooming?: None Help from another person toileting, which includes using toliet, bedpan, or urinal?: None Help from another person bathing (including washing, rinsing, drying)?: A Little Help from another person to put on and taking off regular upper body clothing?: A Lot Help from another person to put on and taking off regular lower body clothing?: A Little 6 Click Score: 20   End of Session Equipment Utilized During Treatment: Other (comment) (Sling) Nurse Communication: Other (comment) (All post-op instructions completed)  Activity Tolerance: Patient tolerated treatment well Patient left: in bed;with nursing/sitter in room;with family/visitor present  OT Visit Diagnosis: Muscle weakness (generalized) (M62.81)                Time: 1110-1140 OT Time Calculation (min): 30 min Charges:  OT General Charges $OT Visit: 1 Visit OT Evaluation $OT Eval Low Complexity: 1 Low OT Treatments $Self Care/Home Management : 8-22 mins  Anderson Malta, OT Acute Rehab Services Office: [phone] 02/23/2021  Julien Girt 02/23/2021, 12:41 PM

## 2021-02-24 ENCOUNTER — Encounter (HOSPITAL_COMMUNITY): Payer: Self-pay | Admitting: Orthopedic Surgery

## 2021-03-06 ENCOUNTER — Other Ambulatory Visit (HOSPITAL_COMMUNITY): Payer: Self-pay

## 2021-03-10 ENCOUNTER — Other Ambulatory Visit (HOSPITAL_COMMUNITY): Payer: Self-pay

## 2021-03-10 DIAGNOSIS — Z96612 Presence of left artificial shoulder joint: Secondary | ICD-10-CM | POA: Diagnosis not present

## 2021-03-10 DIAGNOSIS — Z471 Aftercare following joint replacement surgery: Secondary | ICD-10-CM | POA: Diagnosis not present

## 2021-03-10 DIAGNOSIS — Z4789 Encounter for other orthopedic aftercare: Secondary | ICD-10-CM | POA: Diagnosis not present

## 2021-03-10 MED ORDER — DIAZEPAM 5 MG PO TABS
ORAL_TABLET | Freq: Every evening | ORAL | 0 refills | Status: DC | PRN
  Filled 2021-03-10: qty 40, 20d supply, fill #0

## 2021-03-27 DIAGNOSIS — M25512 Pain in left shoulder: Secondary | ICD-10-CM | POA: Diagnosis not present

## 2021-03-30 DIAGNOSIS — M25512 Pain in left shoulder: Secondary | ICD-10-CM | POA: Diagnosis not present

## 2021-04-03 DIAGNOSIS — M25512 Pain in left shoulder: Secondary | ICD-10-CM | POA: Diagnosis not present

## 2021-04-05 ENCOUNTER — Encounter: Payer: Self-pay | Admitting: Orthopaedic Surgery

## 2021-04-05 ENCOUNTER — Ambulatory Visit (INDEPENDENT_AMBULATORY_CARE_PROVIDER_SITE_OTHER): Payer: 59 | Admitting: Orthopaedic Surgery

## 2021-04-05 ENCOUNTER — Other Ambulatory Visit: Payer: Self-pay

## 2021-04-05 DIAGNOSIS — M25552 Pain in left hip: Secondary | ICD-10-CM

## 2021-04-05 NOTE — Progress Notes (Signed)
The patient is a very pleasant 61 year old female sent from Dr. Hulan Saas to evaluate and treat left hip pain with a MRI that is suspicious for a signal changes in the labrum and some mild arthritic changes.  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  The patient is a very thin individual.  She is actually been going to [facility] for physical therapy but only for her shoulder.  She has had a shoulder replacement.  She has not had any type of therapy around her left hip and has not had any injections either.  She is opposed to injections right now.  She does not tolerate anti-inflammatories because they bother her too much.  The MRI has been reviewed by me and I showed the MRI to her.  There is minimal signal in the labrum and this is not a MRI arthrogram but does not look like a labral tear on my read.  There is no hip joint effusion and there is no edema in the femoral head or acetabulum.  There is some signal changes around the insertion of the hamstring origin at the ischium.  Examination of her left hip shows it moves smoothly and fluidly with no blocks or rotation at all.  There is minimal pain in the groin the most of her pain seems to be around the posterior trochanteric area of the left hip and the ischium as well.  I gave her reassurance that there is no surgery that she needs for her left hip including arthroscopic surgery or a hip replacement.  These are not indicated based off her MRI findings and her clinical exam findings.  I did give her a note to give to [facility] for his physical therapy expertise with performing any modalities that can help these areas around her left hip. The recommendation that I would provide is considering an intra-articular steroid injection in her left hip joint under ultrasound and potentially an injection of a steroid around her left ischium which could help from a therapeutic and diagnostic  standpoint.  We can see her back in 6 weeks to see how she is doing overall.  I would send her back probably to Dr. Tamala Julian for these injections if we decide that would be the next step.

## 2021-04-07 DIAGNOSIS — M25512 Pain in left shoulder: Secondary | ICD-10-CM | POA: Diagnosis not present

## 2021-04-11 NOTE — Progress Notes (Signed)
[facility] [place] Phone: [phone] Subjective:   Fontaine No, am serving as a scribe for Dr. Hulan Saas.  This visit occurred during the SARS-CoV-2 public health emergency.  Safety protocols were in place, including screening questions prior to the visit, additional usage of staff PPE, and extensive cleaning of exam room while observing appropriate contact time as indicated for disinfecting solutions.  I'm seeing this patient by the request  of:  [name], [name], NP  CC: neck and back pain   JAS:[id number]  MAHOGANY TORRANCE is a 61 y.o. female coming in with complaint of back and neck pain. OMT on 11/30/2020. Saw Dr Glennon Mac in Nov 2022 for back pain.  Has been responding well to osteopathic manipulation.  Recently did have another shoulder replacement in January 2023.  Also saw orthopedic surgery recently in February for left hip pain.  They discussed with her to avoid any type of surgical intervention including for the labrum.  The patient was to start with formal physical therapy with physical therapist.  Dr. Ninfa Linden was concerned more the discomfort was coming from the potential ischium and hamstring and may need to consider an injection there as well.  Patient states that she continues to have radiating pain in L leg. Also having cervical spine pain and had cervical xray from [facility] today.   Medications patient has been prescribed: Meloxicam  Taking:       Reviewed prior external information including notes and imaging from previsou exam, outside providers and external EMR if available.   As well as notes that were available from care everywhere and other healthcare systems.  Past medical history, social, surgical and family history all reviewed in electronic medical record.  No pertanent information unless stated regarding to the chief complaint.   Past Medical History:  Diagnosis Date   Anemia     Arthritis    Hyperlipidemia    Insomnia    Mitral valve prolapse    PONV (postoperative nausea and vomiting)     Allergies  Allergen Reactions   Codeine Nausea And Vomiting   Hydrocodone Nausea And Vomiting   Triple Antibiotic Pain Relief [Neomy-Bacit-Polymyx-Pramoxine] Other (See Comments)    Burn   Triple Antibiotic W/Hydrocortisone  [Bacitra-Neomycin-Polymyxin-Hc]    Contrast Media [Iodinated Contrast Media] Hives    After CT scan with contrast, pt had MRI without contrast. Pt informed MRI Tech that she had a rash on her legs. MRI Tech spoke with Radiologist after MRI was completed. Per Rad, since patient was here x 30 minutes after CT contrast and was doing fine, with rash fading, pt was allowed to leave facility. Alert and oriented. jmh     Review of Systems:  No headache, visual changes, nausea, vomiting, diarrhea, constipation, dizziness, abdominal pain, skin rash, fevers, chills, night sweats, weight loss, swollen lymph nodes, body aches, joint swelling, chest pain, shortness of breath, mood changes. POSITIVE muscle aches  Objective  Blood pressure 98/72, height 5\' 10"  (1.778 m), weight 140 lb (63.5 kg), last menstrual period 08/14/2012.   General: No apparent distress alert and oriented x3 mood and affect normal, dressed appropriately.  Patient is cachectic HEENT: Pupils equal, extraocular movements intact  Respiratory: Patient's speak in full sentences and does not appear short of breath  Cardiovascular: No lower extremity edema, non tender, no erythema  Gait normal with good balance and coordination.  MSK: Low back does have significant loss of lordosis, tightness noted  in the paraspinal musculature.  Patient has limited range of motion.  He does have arthritic changes in multiple joints that seems significantly premature for patient's age.  Osteopathic findings  C2 flexed rotated and side bent right C7 flexed rotated and side bent left T3 extended rotated and side bent  right inhaled rib T8 extended rotated and side bent left L2 flexed rotated and side bent right Sacrum right on right       Assessment and Plan:  Low back pain Continue chronic pain, discuss hep, we have done significant work up over time, discussed which activities to do and what to avoid. Patient would like to avoid too much medication if possible. Does have flexeril     DDD (degenerative disc disease), cervical Likely contributing sometime as well to radiation  To the arm at this time  Have had surgery as well on shoulder recently.  Discussed HEP     Nonallopathic problems  Decision today to treat with OMT was based on Physical Exam  After verbal consent patient was treated with HVLA, ME, FPR techniques in cervical, rib, thoracic, lumbar, and sacral  areas  Patient tolerated the procedure well with improvement in symptoms  Patient given exercises, stretches and lifestyle modifications  See medications in patient instructions if given  Patient will follow up in 4-8 weeks      The above documentation has been reviewed and is accurate and complete Lyndal Pulley, DO        Note: This dictation was prepared with Dragon dictation along with smaller phrase technology. Any transcriptional errors that result from this process are unintentional.

## 2021-04-12 ENCOUNTER — Other Ambulatory Visit: Payer: Self-pay

## 2021-04-12 ENCOUNTER — Ambulatory Visit (INDEPENDENT_AMBULATORY_CARE_PROVIDER_SITE_OTHER): Payer: 59 | Admitting: Family Medicine

## 2021-04-12 VITALS — BP 98/72 | Ht 70.0 in | Wt 140.0 lb

## 2021-04-12 DIAGNOSIS — M9901 Segmental and somatic dysfunction of cervical region: Secondary | ICD-10-CM

## 2021-04-12 DIAGNOSIS — G8929 Other chronic pain: Secondary | ICD-10-CM | POA: Diagnosis not present

## 2021-04-12 DIAGNOSIS — M9908 Segmental and somatic dysfunction of rib cage: Secondary | ICD-10-CM | POA: Diagnosis not present

## 2021-04-12 DIAGNOSIS — M5442 Lumbago with sciatica, left side: Secondary | ICD-10-CM | POA: Diagnosis not present

## 2021-04-12 DIAGNOSIS — M810 Age-related osteoporosis without current pathological fracture: Secondary | ICD-10-CM | POA: Diagnosis not present

## 2021-04-12 DIAGNOSIS — M9902 Segmental and somatic dysfunction of thoracic region: Secondary | ICD-10-CM | POA: Diagnosis not present

## 2021-04-12 DIAGNOSIS — M9904 Segmental and somatic dysfunction of sacral region: Secondary | ICD-10-CM

## 2021-04-12 DIAGNOSIS — M9903 Segmental and somatic dysfunction of lumbar region: Secondary | ICD-10-CM

## 2021-04-12 DIAGNOSIS — Z96611 Presence of right artificial shoulder joint: Secondary | ICD-10-CM | POA: Diagnosis not present

## 2021-04-12 DIAGNOSIS — M5030 Other cervical disc degeneration, unspecified cervical region: Secondary | ICD-10-CM | POA: Diagnosis not present

## 2021-04-12 DIAGNOSIS — Z96612 Presence of left artificial shoulder joint: Secondary | ICD-10-CM | POA: Diagnosis not present

## 2021-04-12 DIAGNOSIS — M542 Cervicalgia: Secondary | ICD-10-CM | POA: Diagnosis not present

## 2021-04-12 NOTE — Patient Instructions (Signed)
Good to see you Sorry I do not have answer Consider MRI if neck gets worse Can go injection in hip or hamstring  See me in 6-8 weeks

## 2021-04-13 DIAGNOSIS — M25512 Pain in left shoulder: Secondary | ICD-10-CM | POA: Diagnosis not present

## 2021-04-13 DIAGNOSIS — M5030 Other cervical disc degeneration, unspecified cervical region: Secondary | ICD-10-CM | POA: Insufficient documentation

## 2021-04-13 NOTE — Assessment & Plan Note (Signed)
Continue chronic pain, discuss hep, we have done significant work up over time, discussed which activities to do and what to avoid. Patient would like to avoid too much medication if possible. Does have flexeril

## 2021-04-13 NOTE — Assessment & Plan Note (Signed)
Likely contributing sometime as well to radiation  To the arm at this time  Have had surgery as well on shoulder recently.  Discussed HEP

## 2021-04-14 NOTE — Assessment & Plan Note (Signed)
Osteoporosis is noted.  Started on Prolia.  Has had 1 dose.  May need to continue to monitor and see if any other causes.  Patient is still seeing endocrinology and working up the hypercalciuria should be seen in the next month to have this repeated.

## 2021-04-15 ENCOUNTER — Encounter: Payer: Self-pay | Admitting: Family Medicine

## 2021-04-17 DIAGNOSIS — M542 Cervicalgia: Secondary | ICD-10-CM | POA: Diagnosis not present

## 2021-04-17 DIAGNOSIS — M25512 Pain in left shoulder: Secondary | ICD-10-CM | POA: Diagnosis not present

## 2021-04-18 ENCOUNTER — Encounter: Payer: Self-pay | Admitting: Family Medicine

## 2021-04-19 DIAGNOSIS — M25512 Pain in left shoulder: Secondary | ICD-10-CM | POA: Diagnosis not present

## 2021-04-19 DIAGNOSIS — M542 Cervicalgia: Secondary | ICD-10-CM | POA: Diagnosis not present

## 2021-04-25 DIAGNOSIS — M542 Cervicalgia: Secondary | ICD-10-CM | POA: Diagnosis not present

## 2021-04-25 DIAGNOSIS — M25512 Pain in left shoulder: Secondary | ICD-10-CM | POA: Diagnosis not present

## 2021-04-26 DIAGNOSIS — Z124 Encounter for screening for malignant neoplasm of cervix: Secondary | ICD-10-CM | POA: Diagnosis not present

## 2021-04-26 DIAGNOSIS — Z6821 Body mass index (BMI) 21.0-21.9, adult: Secondary | ICD-10-CM | POA: Diagnosis not present

## 2021-04-26 DIAGNOSIS — Z01419 Encounter for gynecological examination (general) (routine) without abnormal findings: Secondary | ICD-10-CM | POA: Diagnosis not present

## 2021-04-26 DIAGNOSIS — N952 Postmenopausal atrophic vaginitis: Secondary | ICD-10-CM | POA: Diagnosis not present

## 2021-04-27 DIAGNOSIS — M542 Cervicalgia: Secondary | ICD-10-CM | POA: Diagnosis not present

## 2021-04-27 DIAGNOSIS — M25512 Pain in left shoulder: Secondary | ICD-10-CM | POA: Diagnosis not present

## 2021-04-28 ENCOUNTER — Ambulatory Visit: Payer: 59 | Admitting: Internal Medicine

## 2021-05-01 DIAGNOSIS — M542 Cervicalgia: Secondary | ICD-10-CM | POA: Diagnosis not present

## 2021-05-01 DIAGNOSIS — M25512 Pain in left shoulder: Secondary | ICD-10-CM | POA: Diagnosis not present

## 2021-05-03 ENCOUNTER — Encounter: Payer: Self-pay | Admitting: Family

## 2021-05-03 DIAGNOSIS — M25512 Pain in left shoulder: Secondary | ICD-10-CM | POA: Diagnosis not present

## 2021-05-03 DIAGNOSIS — M542 Cervicalgia: Secondary | ICD-10-CM | POA: Diagnosis not present

## 2021-05-03 NOTE — Telephone Encounter (Signed)
Prolia VOB initiated via [email or web address] ? ?Last OV:  ?Next OV:  ?Last Prolia inj: 12/02/21 ?Next Prolia inj DUE: 06/03/21 ? ?

## 2021-05-04 ENCOUNTER — Ambulatory Visit (INDEPENDENT_AMBULATORY_CARE_PROVIDER_SITE_OTHER): Payer: 59 | Admitting: Family

## 2021-05-04 ENCOUNTER — Encounter: Payer: Self-pay | Admitting: Family

## 2021-05-04 ENCOUNTER — Other Ambulatory Visit: Payer: Self-pay

## 2021-05-04 ENCOUNTER — Other Ambulatory Visit (HOSPITAL_COMMUNITY): Payer: Self-pay

## 2021-05-04 VITALS — BP 120/88 | HR 54 | Temp 96.9°F | Resp 16 | Ht 70.0 in | Wt 144.2 lb

## 2021-05-04 DIAGNOSIS — R7989 Other specified abnormal findings of blood chemistry: Secondary | ICD-10-CM | POA: Diagnosis not present

## 2021-05-04 DIAGNOSIS — G4709 Other insomnia: Secondary | ICD-10-CM | POA: Diagnosis not present

## 2021-05-04 DIAGNOSIS — M159 Polyosteoarthritis, unspecified: Secondary | ICD-10-CM

## 2021-05-04 DIAGNOSIS — E7800 Pure hypercholesterolemia, unspecified: Secondary | ICD-10-CM

## 2021-05-04 DIAGNOSIS — Z0000 Encounter for general adult medical examination without abnormal findings: Secondary | ICD-10-CM | POA: Diagnosis not present

## 2021-05-04 DIAGNOSIS — R232 Flushing: Secondary | ICD-10-CM | POA: Diagnosis not present

## 2021-05-04 MED ORDER — TRAZODONE HCL 50 MG PO TABS
25.0000 mg | ORAL_TABLET | Freq: Every evening | ORAL | 3 refills | Status: DC | PRN
  Filled 2021-05-04: qty 30, 30d supply, fill #0

## 2021-05-04 NOTE — Progress Notes (Signed)
? ?Provider: Marlowe Sax FNP-C  ? ?Catharina Pica, Nelda Bucks, NP ? ?Patient Care Team: ?Annette Liotta, Nelda Bucks, NP as PCP - General (Family Medicine) ?Kennith Center, RD as Dietitian (Family Medicine) ?Katy Apo, MD as Consulting Physician (Ophthalmology) ?Adornetto, Lucianne Lei, DMD (Dentistry) ?Kennith Center, RD as Dietitian (Family Medicine) ? ?Extended Emergency Contact Information ?Primary Emergency Contact: Tiajuana Amass ?Address: [place]  ?         [place], [place] [address] [place] ?Home Phone: [phone] ?Work Phone: [phone] ?Mobile Phone: [phone] ?Relation: Spouse ? ?Code Status: Full code ? ?Goals of care: Advanced Directive information ?Advanced Directives 05/03/2021  ?Does Patient Have a Medical Advance Directive? Yes  ?Type of Paramedic of [place];Living will  ?Does patient want to make changes to medical advance directive? No - Patient declined  ?Copy of [place] in Chart? Yes - validated most recent copy scanned in chart (See row information)  ?Pre-existing out of facility DNR order (yellow form or pink MOST form) -  ? ? ? ?Chief Complaint  ?Patient presents with  ? Annual Exam  ?  Physical.  ? Lab   ?  Fasting Labs.   ? Immunizations  ?  Discuss the need for Covid Booster.  ? ? ?HPI:  ?Pt is a 61 y.o. female seen today for her annual physical examination and  management of chronic diseases.  She complains of not being able to sleep at night this has been chronic for. She is able to sleep 30 minutes to 1 hour and then she wakes up. She tends to snack whenever she wakes up.  Has tried melatonin and herbal medication.   ? ? ?She has had 4 pounds weight gain since last visit.  BMI within normal range. ? ?Complains of shoulder pain, osteoarthritis. ?Last Pap smear, breast examination completed by GYN.  States this "flushes, medication without any relief. ?She is due for her fourth COVID.  She is aware to get this at her  pharmacy. ? ?Past Medical History:  ?Diagnosis Date  ? Anemia   ? Arthritis   ? Hyperlipidemia   ? Insomnia   ? Mitral valve prolapse   ? PONV (postoperative nausea and vomiting)   ? ?Past Surgical History:  ?Procedure Laterality Date  ? AUGMENTATION MAMMAPLASTY Bilateral   ? Dec 2019  ? BREAST BIOPSY Right   ? axillary node benign  ? KNEE SURGERY  1988  ? right  ? SHOULDER ARTHROSCOPY WITH LABRAL REPAIR Right 08/28/2012  ? Procedure: RIGHT SHOULDER ARTHROSCOPY WITH LABRAL DEBRIDEMENT, ASPIRATION AND DECOMPRESSION OF PARALABRAL CYST;  Surgeon: Marin Shutter, MD;  Location: [facility];  Service: Orthopedics;  Laterality: Right;  ? TOTAL SHOULDER ARTHROPLASTY Right 03/24/2020  ? Procedure: RIGHT REVERSE SHOULDER ARTHROPLASTY;  Surgeon: Justice Britain, MD;  Location: [facility];  Service: Orthopedics;  Laterality: Right;  155min  ? TOTAL SHOULDER ARTHROPLASTY Left 02/23/2021  ? Procedure: TOTAL SHOULDER ARTHROPLASTY;  Surgeon: Justice Britain, MD;  Location: [facility];  Service: Orthopedics;  Laterality: Left;  ? ? ?Allergies  ?Allergen Reactions  ? Codeine Nausea And Vomiting  ? Hydrocodone Nausea And Vomiting  ? Triple Antibiotic Pain Relief [Neomy-Bacit-Polymyx-Pramoxine] Other (See Comments)  ?  Burn  ? Triple Antibiotic W/Hydrocortisone  [Bacitra-Neomycin-Polymyxin-Hc]   ? Contrast Media [Iodinated Contrast Media] Hives  ?  After CT scan with contrast, pt had MRI without contrast. Pt informed MRI Tech that she had a rash on her legs. MRI Tech  spoke with Radiologist after MRI was completed. Per Rad, since patient was here x 30 minutes after CT contrast and was doing fine, with rash fading, pt was allowed to leave facility. Alert and oriented. jmh  ? ? ?Allergies as of 05/04/2021   ? ?   Reactions  ? Codeine Nausea And Vomiting  ? Hydrocodone Nausea And Vomiting  ? Triple Antibiotic Pain Relief [neomy-bacit-polymyx-pramoxine] Other (See Comments)  ? Burn  ? Triple Antibiotic W/hydrocortisone  [bacitra-neomycin-polymyxin-hc]   ?  Contrast Media [iodinated Contrast Media] Hives  ? After CT scan with contrast, pt had MRI without contrast. Pt informed MRI Tech that she had a rash on her legs. MRI Tech spoke with Radiologist after MRI was completed. Per Rad, since patient was here x 30 minutes after CT contrast and was doing fine, with rash fading, pt was allowed to leave facility. Alert and oriented. jmh  ? ?  ? ?  ?Medication List  ?  ? ?  ? Accurate as of May 04, 2021  9:04 AM. If you have any questions, ask your nurse or doctor.  ?  ?  ? ?  ? ?STOP taking these medications   ? ?Carestart COVID-19 Home Test Kit ?Generic drug: COVID-19 At Home Antigen Test ?Stopped by: Sandrea Hughs, NP ?  ?cyclobenzaprine 10 MG tablet ?Commonly known as: FLEXERIL ?Stopped by: Sandrea Hughs, NP ?  ?HYDROmorphone 2 MG tablet ?Commonly known as: Dilaudid ?Stopped by: Sandrea Hughs, NP ?  ?meloxicam 15 MG tablet ?Commonly known as: MOBIC ?Stopped by: Sandrea Hughs, NP ?  ?meloxicam 7.5 MG tablet ?Commonly known as: Mobic ?Stopped by: Sandrea Hughs, NP ?  ?ondansetron 4 MG tablet ?Commonly known as: Zofran ?Stopped by: Sandrea Hughs, NP ?  ?traMADol 50 MG tablet ?Commonly known as: ULTRAM ?Stopped by: Sandrea Hughs, NP ?  ? ?  ? ?TAKE these medications   ? ?Black Cohosh 40 MG Caps ?Take 1 capsule by mouth in the morning, at noon, in the evening, and at bedtime. ?  ?CALCIUM CITRATE PO ?Take 600 mg by mouth 2 (two) times daily. Number varies each day based on calcium ingested in food ?  ?cetirizine 10 MG tablet ?Commonly known as: ZYRTEC ?Take 10 mg by mouth daily as needed for allergies or rhinitis. ?  ?denosumab 60 MG/ML Sosy injection ?Commonly known as: PROLIA ?Inject 60 mg into the skin every 6 (six) months. ?  ?Efinaconazole 10 % Soln ?Apply 1 drop topically daily. ?What changed:  ?when to take this ?reasons to take this ?  ?Fish Oil 1000 MG Caps ?Take 1,000 mg by mouth 2 (two) times daily. ?  ?fluticasone 50 MCG/ACT nasal spray ?Commonly known  as: FLONASE ?Place 1 spray into both nostrils 3 (three) times daily as needed for allergies. ?  ?multivitamin with minerals Tabs tablet ?Take 1 tablet by mouth daily. ?  ?OVER THE COUNTER MEDICATION ?Take 1-2 tablets by mouth at bedtime. Formula 303  ?supplement for pain and rest ?  ?PROBIOTIC PO ?Take 1 capsule by mouth daily. ?  ?Red Yeast Rice 600 MG Caps ?Take 600 mg by mouth 2 (two) times daily. ?  ?Tart Cherry Ultra 1200 MG Caps ?Generic drug: Gobles ?Take 1,200 mg by mouth 3 (three) times daily. ?  ?tretinoin 0.05 % cream ?Commonly known as: RETIN-A ?Apply 1 application topically at bedtime. ?  ?Tri-Luma 0.01-4-0.05 % Crea ?Generic drug: Fluocin-Hydroquinone-Tretinoin ?Apply to affeced areas or dark spots once daily. ?What  changed:  ?how much to take ?how to take this ?when to take this ?reasons to take this ?  ?Vitamin D3 50 MCG (2000 UT) Tabs ?Take 2,000 Units by mouth daily. ?  ? ?  ? ? ?Review of Systems  ?Constitutional:  Negative for appetite change, chills, fatigue, fever and unexpected weight change.  ?HENT:  Negative for congestion, dental problem, ear discharge, ear pain, facial swelling, hearing loss, nosebleeds, postnasal drip, rhinorrhea, sinus pressure, sinus pain, sneezing, sore throat, tinnitus and trouble swallowing.   ?Eyes:  Negative for pain, discharge, redness, itching and visual disturbance.  ?Respiratory:  Negative for cough, chest tightness, shortness of breath and wheezing.   ?Cardiovascular:  Negative for chest pain, palpitations and leg swelling.  ?Gastrointestinal:  Negative for abdominal distention, abdominal pain, blood in stool, constipation, diarrhea, nausea and vomiting.  ?Endocrine: Negative for cold intolerance, heat intolerance, polydipsia, polyphagia and polyuria.  ?Genitourinary:  Negative for difficulty urinating, dysuria, flank pain, frequency and urgency.  ?Musculoskeletal:  Negative for arthralgias, back pain, gait problem, joint swelling, myalgias, neck pain and  neck stiffness.  ?Skin:  Negative for color change, pallor, rash and wound.  ?Neurological:  Negative for dizziness, syncope, speech difficulty, weakness, light-headedness, numbness and headaches.  ?Hematological:

## 2021-05-05 ENCOUNTER — Other Ambulatory Visit (HOSPITAL_COMMUNITY): Payer: Self-pay

## 2021-05-05 LAB — LIPID PANEL
Cholesterol: 169 mg/dL (ref <200)
HDL: 67 mg/dL (ref >=50)
LDL Cholesterol (Calc): 89 mg/dL (calc)
Non-HDL Cholesterol (Calc): 102 mg/dL (calc) (ref <130)
Total CHOL/HDL Ratio: 2.5 (calc) (ref <5.0)
Triglycerides: 44 mg/dL (ref <150)

## 2021-05-05 LAB — COMPLETE METABOLIC PANEL WITH GFR
AG Ratio: 2 (calc) (ref 1.0–2.5)
ALT: 22 U/L (ref 6–29)
AST: 32 U/L (ref 10–35)
Albumin: 4.5 g/dL (ref 3.6–5.1)
Alkaline phosphatase (APISO): 49 U/L (ref 37–153)
BUN: 20 mg/dL (ref 7–25)
CO2: 27 mmol/L (ref 20–32)
Calcium: 9.8 mg/dL (ref 8.6–10.4)
Chloride: 103 mmol/L (ref 98–110)
Creat: 0.64 mg/dL (ref 0.50–1.03)
Globulin: 2.3 g/dL (calc) (ref 1.9–3.7)
Glucose, Bld: 86 mg/dL (ref 65–99)
Potassium: 4.8 mmol/L (ref 3.5–5.3)
Sodium: 140 mmol/L (ref 135–146)
Total Bilirubin: 0.4 mg/dL (ref 0.2–1.2)
Total Protein: 6.8 g/dL (ref 6.1–8.1)
eGFR: 102 mL/min/{1.73_m2} (ref >=60)

## 2021-05-05 LAB — CBC WITH DIFFERENTIAL/PLATELET
Absolute Monocytes: 418 cells/uL (ref 200–950)
Basophils Absolute: 31 cells/uL (ref 0–200)
Basophils Relative: 0.7 %
Eosinophils Absolute: 189 cells/uL (ref 15–500)
Eosinophils Relative: 4.3 %
HCT: 42.9 % (ref 35.0–45.0)
Hemoglobin: 14.5 g/dL (ref 11.7–15.5)
Lymphs Abs: 950 cells/uL (ref 850–3900)
MCH: 33 pg (ref 27.0–33.0)
MCHC: 33.8 g/dL (ref 32.0–36.0)
MCV: 97.7 fL (ref 80.0–100.0)
MPV: 10.6 fL (ref 7.5–12.5)
Monocytes Relative: 9.5 %
Neutro Abs: 2812 cells/uL (ref 1500–7800)
Neutrophils Relative %: 63.9 %
Platelets: 212 10*3/uL (ref 140–400)
RBC: 4.39 10*6/uL (ref 3.80–5.10)
RDW: 11.8 % (ref 11.0–15.0)
Total Lymphocyte: 21.6 %
WBC: 4.4 10*3/uL (ref 3.8–10.8)

## 2021-05-05 LAB — TSH: TSH: 1.21 mIU/L (ref 0.40–4.50)

## 2021-05-08 DIAGNOSIS — M25512 Pain in left shoulder: Secondary | ICD-10-CM | POA: Diagnosis not present

## 2021-05-08 DIAGNOSIS — M542 Cervicalgia: Secondary | ICD-10-CM | POA: Diagnosis not present

## 2021-05-12 ENCOUNTER — Other Ambulatory Visit: Payer: Self-pay

## 2021-05-12 NOTE — Telephone Encounter (Signed)
Prior auth required for PROLIA  PA PROCESS DETAILS: Prior Authorization is Required. We are unable to confirm if a PA is on file. Please check your records or contact the payer  

## 2021-05-15 DIAGNOSIS — M25512 Pain in left shoulder: Secondary | ICD-10-CM | POA: Diagnosis not present

## 2021-05-15 DIAGNOSIS — M542 Cervicalgia: Secondary | ICD-10-CM | POA: Diagnosis not present

## 2021-05-15 NOTE — Telephone Encounter (Signed)
Pt ready for scheduling on or after 06/03/21 ? ?Out-of-pocket cost due at time of visit: $0 ? ?Primary: UMR ?Prolia co-insurance: 0% ?Admin fee co-insurance: 0% ? ?Secondary: n/a ?Prolia co-insurance:  ?Admin fee co-insurance:  ? ?Deductible: does not apply ? ?Prior Auth: APPROVED ?PA# [id number] ?Valid: 10/21/20-10/21/21 ?  ? ?** This summary of benefits is an estimation of the patient's out-of-pocket cost. Exact cost may very based on individual plan coverage.  ? ?

## 2021-05-16 ENCOUNTER — Ambulatory Visit: Payer: 59

## 2021-05-16 ENCOUNTER — Ambulatory Visit (INDEPENDENT_AMBULATORY_CARE_PROVIDER_SITE_OTHER): Payer: 59 | Admitting: Podiatry

## 2021-05-16 DIAGNOSIS — M21612 Bunion of left foot: Secondary | ICD-10-CM | POA: Diagnosis not present

## 2021-05-16 DIAGNOSIS — M2041 Other hammer toe(s) (acquired), right foot: Secondary | ICD-10-CM | POA: Diagnosis not present

## 2021-05-16 DIAGNOSIS — M2042 Other hammer toe(s) (acquired), left foot: Secondary | ICD-10-CM | POA: Diagnosis not present

## 2021-05-16 DIAGNOSIS — Q828 Other specified congenital malformations of skin: Secondary | ICD-10-CM

## 2021-05-16 DIAGNOSIS — B351 Tinea unguium: Secondary | ICD-10-CM | POA: Diagnosis not present

## 2021-05-16 NOTE — Patient Instructions (Signed)
For inserts look at Powersteps, superfeet, aetrex inserts ? ? ?For other shoes look at Lifestride, vionic. You should look at materials that are softer and not going to put pressure on the bunions. See if you can find one that has an "extra-depth" or "double-depth"  ?

## 2021-05-16 NOTE — Telephone Encounter (Signed)
?  Vitamin D is set up our our computer as UT ( units ).That 's the same.  ?

## 2021-05-16 NOTE — Telephone Encounter (Signed)
Great !! Your current LDL was within normal range.Continue with the dietary modification and Exercise.  ?

## 2021-05-17 ENCOUNTER — Ambulatory Visit: Payer: 59 | Admitting: Orthopaedic Surgery

## 2021-05-17 ENCOUNTER — Ambulatory Visit: Payer: 59 | Admitting: Family Medicine

## 2021-05-17 NOTE — Progress Notes (Signed)
?Charlann Boxer D.O. ?[facility] ?[place] ?Phone: [phone] ?Subjective:   ?I, Jacqualin Combes, am serving as a scribe for Dr. Hulan Saas. ? ?This visit occurred during the SARS-CoV-2 public health emergency.  Safety protocols were in place, including screening questions prior to the visit, additional usage of staff PPE, and extensive cleaning of exam room while observing appropriate contact time as indicated for disinfecting solutions.  ? ?I'm seeing this patient by the request  of:  [name], [name], NP ? ?CC: Neck and back pain follow-up ? ?BWI:[id number]  ?Tara [name] is a 61 y.o. female coming in with complaint of back and neck pain. OMT on 04/12/2021. Patient states that she is still the same as last visit.  ? ?Medications patient has been prescribed: None ? ? ? ?  ? ? ? ? ?Reviewed prior external information including notes and imaging from previsou exam, outside providers and external EMR if available.  ? ?As well as notes that were available from care everywhere and other healthcare systems. ? ?Past medical history, social, surgical and family history all reviewed in electronic medical record.  No pertanent information unless stated regarding to the chief complaint.  ? ?Past Medical History:  ?Diagnosis Date  ? Anemia   ? Arthritis   ? Hyperlipidemia   ? Insomnia   ? Mitral valve prolapse   ? PONV (postoperative nausea and vomiting)   ?  ?Allergies  ?Allergen Reactions  ? Codeine Nausea And Vomiting  ? Hydrocodone Nausea And Vomiting  ? Triple Antibiotic Pain Relief [Neomy-Bacit-Polymyx-Pramoxine] Other (See Comments)  ?  Burn  ? Triple Antibiotic W/Hydrocortisone  [Bacitra-Neomycin-Polymyxin-Hc]   ? Contrast Media [Iodinated Contrast Media] Hives  ?  After CT scan with contrast, pt had MRI without contrast. Pt informed MRI Tech that she had a rash on her legs. MRI Tech spoke with Radiologist after MRI was completed. Per Rad, since patient was here x 30  minutes after CT contrast and was doing fine, with rash fading, pt was allowed to leave facility. Alert and oriented. jmh  ? ? ? ?Review of Systems: ? No headache, visual changes, nausea, vomiting, diarrhea, constipation, dizziness, abdominal pain, skin rash, fevers, chills, night sweats, weight loss, swollen lymph nodes, body aches, joint swelling, chest pain, shortness of breath, mood changes. POSITIVE muscle aches ? ?Objective  ?Blood pressure 104/74, height '5\' 10"'$  (1.778 m), weight 147 lb (66.7 kg), last menstrual period 08/14/2012. ?  ?General: No apparent distress alert and oriented x3 mood and affect normal, dressed appropriately.  ?HEENT: Pupils equal, extraocular movements intact  ?Respiratory: Patient's speak in full sentences and does not appear short of breath  ?Cardiovascular: No lower extremity edema, non tender, no erythema  ?Back exam does have significant loss of lordosis.  Patient does have hypertonicity noted in multiple different muscles.  Very minimal flexion of the back noted actually today.  Seems tighter than usual.  Neck exam still has some crepitus noted as well.  Some limited range of motion. ? ?Osteopathic findings ? ?C2 flexed rotated and side bent right ?C5 flexed rotated and side bent left ?T3 extended rotated and side bent right inhaled rib ?T8 extended rotated and side bent left ?L2 flexed rotated and side bent right ?Sacrum right on right ? ? ? ? ?  ?Assessment and Plan: ? ?DDD (degenerative disc disease), cervical ?Known arthritic changes.  The patient is doing relatively well.  He feels like she may have some mild to  minimal improvement each time we do the manipulation.  Patient is still having difficulty with everything else with her pain.  Still having significant amount of different arthritis.  Seeing endocrinology and still has some more laboratory work-up occurring.  Follow-up with me again in 6 to 8 weeks ? ?Other insomnia ?Encourage patient to try the trazodone.  Do feel  like it is one of her better options. ? ?Low back pain ?Chronic pain as well.  Work-up has been relatively unremarkable.  MRI of the lumbar spine has shown some mild possible nerve root impingements but nothing severe.  We will continue to monitor.  Has had some difficulty with medications and patient would like to try to stay as natural as possible.  Follow-up again in 6 to 8 weeks  ? ?Nonallopathic problems ? ?Decision today to treat with OMT was based on Physical Exam ? ?After verbal consent patient was treated with HVLA, ME, FPR techniques in cervical, rib, thoracic, lumbar, and sacral  areas ? ?Patient tolerated the procedure well with improvement in symptoms ? ?Patient given exercises, stretches and lifestyle modifications ? ?See medications in patient instructions if given ? ?Patient will follow up in 4-8 weeks ? ?  ? ?The above documentation has been reviewed and is accurate and complete Lyndal Pulley, DO ? ? ? ?  ? ? Note: This dictation was prepared with Dragon dictation along with smaller phrase technology. Any transcriptional errors that result from this process are unintentional.    ?  ?  ? ?

## 2021-05-18 ENCOUNTER — Ambulatory Visit (INDEPENDENT_AMBULATORY_CARE_PROVIDER_SITE_OTHER): Payer: 59 | Admitting: Family Medicine

## 2021-05-18 ENCOUNTER — Other Ambulatory Visit: Payer: 59

## 2021-05-18 VITALS — BP 104/74 | Ht 70.0 in | Wt 147.0 lb

## 2021-05-18 DIAGNOSIS — R82994 Hypercalciuria: Secondary | ICD-10-CM

## 2021-05-18 DIAGNOSIS — G4709 Other insomnia: Secondary | ICD-10-CM

## 2021-05-18 DIAGNOSIS — M9902 Segmental and somatic dysfunction of thoracic region: Secondary | ICD-10-CM | POA: Diagnosis not present

## 2021-05-18 DIAGNOSIS — M9904 Segmental and somatic dysfunction of sacral region: Secondary | ICD-10-CM | POA: Diagnosis not present

## 2021-05-18 DIAGNOSIS — M9901 Segmental and somatic dysfunction of cervical region: Secondary | ICD-10-CM

## 2021-05-18 DIAGNOSIS — M9903 Segmental and somatic dysfunction of lumbar region: Secondary | ICD-10-CM

## 2021-05-18 DIAGNOSIS — G8929 Other chronic pain: Secondary | ICD-10-CM | POA: Diagnosis not present

## 2021-05-18 DIAGNOSIS — M9908 Segmental and somatic dysfunction of rib cage: Secondary | ICD-10-CM

## 2021-05-18 DIAGNOSIS — M5442 Lumbago with sciatica, left side: Secondary | ICD-10-CM | POA: Diagnosis not present

## 2021-05-18 DIAGNOSIS — M25512 Pain in left shoulder: Secondary | ICD-10-CM | POA: Diagnosis not present

## 2021-05-18 DIAGNOSIS — M5030 Other cervical disc degeneration, unspecified cervical region: Secondary | ICD-10-CM

## 2021-05-18 DIAGNOSIS — M542 Cervicalgia: Secondary | ICD-10-CM | POA: Diagnosis not present

## 2021-05-18 NOTE — Progress Notes (Unsigned)
Total volume 7,300.  Started 05-17-2021 at 6:00 am ended 05-18-21 at 6:00 am. ?

## 2021-05-19 ENCOUNTER — Other Ambulatory Visit (HOSPITAL_BASED_OUTPATIENT_CLINIC_OR_DEPARTMENT_OTHER): Payer: Self-pay

## 2021-05-19 ENCOUNTER — Telehealth: Payer: Self-pay | Admitting: Internal Medicine

## 2021-05-19 LAB — CALCIUM, URINE, 24 HOUR: Calcium, 24H Urine: 336 mg/24 h — ABNORMAL HIGH

## 2021-05-19 LAB — CREATININE, URINE, 24 HOUR: Creatinine, 24H Ur: 0.73 g/(24.h) (ref 0.50–2.15)

## 2021-05-19 NOTE — Assessment & Plan Note (Signed)
Encourage patient to try the trazodone.  Do feel like it is one of her better options. ?

## 2021-05-19 NOTE — Assessment & Plan Note (Signed)
Chronic pain as well.  Work-up has been relatively unremarkable.  MRI of the lumbar spine has shown some mild possible nerve root impingements but nothing severe.  We will continue to monitor.  Has had some difficulty with medications and patient would like to try to stay as natural as possible.  Follow-up again in 6 to 8 weeks ?

## 2021-05-19 NOTE — Telephone Encounter (Signed)
Can you please contact the patient and bring her for a follow-up soon? ? ? ?She has abnormal urine test and we need to discuss treatment options ? ?Also she is due for 65-monthfollow-up ? ? ?Thanks ?

## 2021-05-19 NOTE — Assessment & Plan Note (Signed)
Known arthritic changes.  The patient is doing relatively well.  He feels like she may have some mild to minimal improvement each time we do the manipulation.  Patient is still having difficulty with everything else with her pain.  Still having significant amount of different arthritis.  Seeing endocrinology and still has some more laboratory work-up occurring.  Follow-up with me again in 6 to 8 weeks ?

## 2021-05-21 NOTE — Progress Notes (Signed)
Subjective: ?61 year old female presents the office today bilateral foot discomfort, calluses as well as dry skin.  Also she has questions about how to apply the Jublia for the nails.  No swelling or redness or any drainage.  No open sores.  No other concerns. ? ? ?Objective: ?AAO x3, NAD ?DP/PT pulses palpable bilaterally, CRT less than 3 seconds ?Hammertoes are present as well as mild bunion.  Hyperkeratotic lesions bilaterally without any underlying ulceration drainage or signs of infection.  Nails are mildly hypertrophic, dystrophic with yellow-brown discoloration.  Dried blood present underneath the right second digit toenail and the nail is firmly adhered and there is no edema, erythema or signs of infection of the toenail.  No pain in the nails there is no redness or drainage or any signs of infection. ?No pain with calf compression, swelling, warmth, erythema ? ?Assessment: ?Hyperkeratotic lesions, hammertoe/bunion deformity, onychomycosis ? ?Plan: ?-All treatment options discussed with the patient including all alternatives, risks, complications.  ?-As a courtesy doing the calluses without any complications or bleeding.  Continue moisturizer. ?-Continue Jublia for nail fungus-discussed how to apply the medication ?-Continue with supportive shoe gear ?-Patient encouraged to call the office with any questions, concerns, change in symptoms.  ? ?Trula Slade DPM ? ?

## 2021-05-23 ENCOUNTER — Ambulatory Visit: Payer: 59 | Admitting: Family Medicine

## 2021-05-29 DIAGNOSIS — M25512 Pain in left shoulder: Secondary | ICD-10-CM | POA: Diagnosis not present

## 2021-05-29 DIAGNOSIS — M542 Cervicalgia: Secondary | ICD-10-CM | POA: Diagnosis not present

## 2021-05-31 ENCOUNTER — Encounter: Payer: Self-pay | Admitting: Podiatry

## 2021-06-01 ENCOUNTER — Encounter: Payer: Self-pay | Admitting: Internal Medicine

## 2021-06-01 DIAGNOSIS — M542 Cervicalgia: Secondary | ICD-10-CM | POA: Diagnosis not present

## 2021-06-01 DIAGNOSIS — M25512 Pain in left shoulder: Secondary | ICD-10-CM | POA: Diagnosis not present

## 2021-06-02 ENCOUNTER — Ambulatory Visit: Payer: 59 | Admitting: Internal Medicine

## 2021-06-02 NOTE — Telephone Encounter (Signed)
Current total cholesterol,Triglycerides,HDL and LDL cholesterol are all within normal range.continue with current diet changes and exercise.No other measures for now.  ?

## 2021-06-06 DIAGNOSIS — M25512 Pain in left shoulder: Secondary | ICD-10-CM | POA: Diagnosis not present

## 2021-06-06 DIAGNOSIS — M542 Cervicalgia: Secondary | ICD-10-CM | POA: Diagnosis not present

## 2021-06-08 ENCOUNTER — Other Ambulatory Visit (HOSPITAL_COMMUNITY): Payer: Self-pay

## 2021-06-08 ENCOUNTER — Other Ambulatory Visit: Payer: Self-pay | Admitting: Family

## 2021-06-08 DIAGNOSIS — L700 Acne vulgaris: Secondary | ICD-10-CM

## 2021-06-08 MED ORDER — TRETINOIN 0.05 % EX GEL
1.0000 "application " | Freq: Every day | CUTANEOUS | 3 refills | Status: DC
  Filled 2021-06-08 – 2021-06-14 (×2): qty 45, 30d supply, fill #0

## 2021-06-08 NOTE — Telephone Encounter (Signed)
Good Afternoon, ?Tretinoin 0.005% gel prescription has been send to pharmacy as requested.  ?

## 2021-06-09 DIAGNOSIS — M542 Cervicalgia: Secondary | ICD-10-CM | POA: Diagnosis not present

## 2021-06-09 DIAGNOSIS — M25512 Pain in left shoulder: Secondary | ICD-10-CM | POA: Diagnosis not present

## 2021-06-14 ENCOUNTER — Other Ambulatory Visit (HOSPITAL_COMMUNITY): Payer: Self-pay

## 2021-06-14 DIAGNOSIS — Z96612 Presence of left artificial shoulder joint: Secondary | ICD-10-CM | POA: Diagnosis not present

## 2021-06-16 DIAGNOSIS — M542 Cervicalgia: Secondary | ICD-10-CM | POA: Diagnosis not present

## 2021-06-16 DIAGNOSIS — M25512 Pain in left shoulder: Secondary | ICD-10-CM | POA: Diagnosis not present

## 2021-06-16 NOTE — Telephone Encounter (Signed)
Received fax from [facility] for Prior Authorization for Tretinoin Gel 0.05%. Form filled out and placed in Tara [name]'s folder for review.  ?To be Faxed back to [facility] once completed Fax:[phone] ?

## 2021-06-17 ENCOUNTER — Other Ambulatory Visit (HOSPITAL_COMMUNITY): Payer: Self-pay

## 2021-06-19 ENCOUNTER — Telehealth: Payer: Self-pay

## 2021-06-19 DIAGNOSIS — M25512 Pain in left shoulder: Secondary | ICD-10-CM | POA: Diagnosis not present

## 2021-06-19 DIAGNOSIS — M542 Cervicalgia: Secondary | ICD-10-CM | POA: Diagnosis not present

## 2021-06-19 NOTE — Telephone Encounter (Signed)
Incoming fax received from [employer] ? ?Key: [id number] ? ?Prior Authorization was initiated and awaiting reply from [employer] which can take 48-72 hours  ?

## 2021-06-19 NOTE — Telephone Encounter (Signed)
Incoming fax received requesting additional information to further process prior authorization. ? ?Diagnosis code was needed. Code was added and form was faxed back ? ?Awaiting reply ?

## 2021-06-20 ENCOUNTER — Other Ambulatory Visit (HOSPITAL_COMMUNITY): Payer: Self-pay

## 2021-06-20 ENCOUNTER — Ambulatory Visit (INDEPENDENT_AMBULATORY_CARE_PROVIDER_SITE_OTHER): Payer: 59 | Admitting: Internal Medicine

## 2021-06-20 ENCOUNTER — Encounter: Payer: Self-pay | Admitting: Internal Medicine

## 2021-06-20 VITALS — BP 112/70 | HR 64 | Ht 70.0 in | Wt 145.0 lb

## 2021-06-20 DIAGNOSIS — M810 Age-related osteoporosis without current pathological fracture: Secondary | ICD-10-CM

## 2021-06-20 DIAGNOSIS — R82994 Hypercalciuria: Secondary | ICD-10-CM | POA: Diagnosis not present

## 2021-06-20 MED ORDER — DENOSUMAB 60 MG/ML ~~LOC~~ SOSY
60.0000 mg | PREFILLED_SYRINGE | Freq: Once | SUBCUTANEOUS | Status: AC
  Administered 2021-06-20: 60 mg via SUBCUTANEOUS

## 2021-06-20 NOTE — Progress Notes (Signed)
After obtaining consent, and per orders of Dr.Shamleffer , injection of Prolia 60mg given by Cardelia Sassano L [facility]. Patient instructed to remain in clinic for 20 minutes afterwards, and to report any adverse reaction to me immediately.  

## 2021-06-20 NOTE — Patient Instructions (Signed)

## 2021-06-20 NOTE — Progress Notes (Signed)
? ? ?Name: Tara [name]  ?MRN/ DOB: [id number], [birth year]    ?Age/ Sex: 61 y.o., female   ? ?PCP: [name], Nelda Bucks, NP   ?Reason for Endocrinology Evaluation: Osteoporosis   ?   ?Date of Initial Endocrinology Evaluation: 10/28/2020  ? ? ?HPI: ?Tara [name] is a 61 y.o. female with a past medical history of Osteoporosis, somatic dysfunction of the spine  . The patient presented for initial endocrinology clinic visit on 10/28/2020 for consultative assistance with her Osteoporosis .  ? ?Pt was diagnosed with osteoporosis:2021 ? ?Menarche at age : 13 ?Menopausal at age : 2018 ?Fracture Hx: no ?Hx of HRT: no ?FH of osteoporosis or hip fracture: mother  ?Prior Hx of anti-estrogenic therapy :no ?Prior Hx of anti-resorptive therapy : She was offered Fosamax in 2021 but opted with [facility] program for 6 months .  She was started on Prolia October/14/2022 ? ?No prior hx of glucocorticoids  ? ? ?SUBJECTIVE:  ? ? ?Today (06/20/21):  Tara [name] is here for a follow up on osteoporosis and hypercalciuria. ? ? ? ?She is S/P left shoulder arthroplasty 02/2021 ?She has seen Dr. Tamala Julian with sports medicine as well as Dr. Ninfa Linden due to arthralgias ? ? ?She stopped taking  calcium tablets as she is consuming enough calcium through her food ?She has reduced her vitamin D 3 to 1000 IU daily ? ? ? ?HISTORY:  ?Past Medical History:  ?Past Medical History:  ?Diagnosis Date  ? Anemia   ? Arthritis   ? Hyperlipidemia   ? Insomnia   ? Mitral valve prolapse   ? PONV (postoperative nausea and vomiting)   ? ?Past Surgical History:  ?Past Surgical History:  ?Procedure Laterality Date  ? AUGMENTATION MAMMAPLASTY Bilateral   ? Dec 2019  ? BREAST BIOPSY Right   ? axillary node benign  ? KNEE SURGERY  1988  ? right  ? SHOULDER ARTHROSCOPY WITH LABRAL REPAIR Right 08/28/2012  ? Procedure: RIGHT SHOULDER ARTHROSCOPY WITH LABRAL DEBRIDEMENT, ASPIRATION AND DECOMPRESSION OF PARALABRAL CYST;  Surgeon: Marin Shutter, MD;  Location: [facility];   Service: Orthopedics;  Laterality: Right;  ? TOTAL SHOULDER ARTHROPLASTY Right 03/24/2020  ? Procedure: RIGHT REVERSE SHOULDER ARTHROPLASTY;  Surgeon: Justice Britain, MD;  Location: [facility];  Service: Orthopedics;  Laterality: Right;  143min  ? TOTAL SHOULDER ARTHROPLASTY Left 02/23/2021  ? Procedure: TOTAL SHOULDER ARTHROPLASTY;  Surgeon: Justice Britain, MD;  Location: [facility];  Service: Orthopedics;  Laterality: Left;  ?  ?Social History:  reports that she has never smoked. She has never used smokeless tobacco. She reports that she does not drink alcohol and does not use drugs. ?Family History: family history includes Stroke in her mother. ? ? ?HOME MEDICATIONS: ?Allergies as of 06/20/2021   ? ?   Reactions  ? Codeine Nausea And Vomiting  ? Hydrocodone Nausea And Vomiting  ? Triple Antibiotic Pain Relief [neomy-bacit-polymyx-pramoxine] Other (See Comments)  ? Burn  ? Triple Antibiotic W/hydrocortisone  [bacitra-neomycin-polymyxin-hc]   ? Contrast Media [iodinated Contrast Media] Hives  ? After CT scan with contrast, pt had MRI without contrast. Pt informed MRI Tech that she had a rash on her legs. MRI Tech spoke with Radiologist after MRI was completed. Per Rad, since patient was here x 30 minutes after CT contrast and was doing fine, with rash fading, pt was allowed to leave facility. Alert and oriented. jmh  ? ?  ? ?  ?Medication List  ?  ? ?  ?  Accurate as of Jun 20, 2021  3:30 PM. If you have any questions, ask your nurse or doctor.  ?  ?  ? ?  ? ?Black Cohosh 40 MG Caps ?Take 1 capsule by mouth in the morning, at noon, in the evening, and at bedtime. ?  ?CALCIUM CITRATE PO ?Take 600 mg by mouth 2 (two) times daily. Number varies each day based on calcium ingested in food ?  ?cetirizine 10 MG tablet ?Commonly known as: ZYRTEC ?Take 10 mg by mouth daily as needed for allergies or rhinitis. ?  ?denosumab 60 MG/ML Sosy injection ?Commonly known as: PROLIA ?Inject 60 mg into the skin every 6 (six) months. ?  ?Efinaconazole  10 % Soln ?Apply 1 drop topically daily. ?What changed:  ?when to take this ?reasons to take this ?  ?Fish Oil 1000 MG Caps ?Take 1,000 mg by mouth 2 (two) times daily. ?  ?fluticasone 50 MCG/ACT nasal spray ?Commonly known as: FLONASE ?Place 1 spray into both nostrils 3 (three) times daily as needed for allergies. ?  ?multivitamin with minerals Tabs tablet ?Take 1 tablet by mouth daily. ?  ?OVER THE COUNTER MEDICATION ?Take 1-2 tablets by mouth at bedtime. Formula 303  ?supplement for pain and rest ?  ?PROBIOTIC PO ?Take 1 capsule by mouth daily. ?  ?Red Yeast Rice 600 MG Caps ?Take 600 mg by mouth 2 (two) times daily. ?  ?Tart Cherry Ultra 1200 MG Caps ?Generic drug: [place] ?Take 1,200 mg by mouth 3 (three) times daily. ?  ?traZODone 50 MG tablet ?Commonly known as: DESYREL ?Take 0.5-1 tablet (25-50 mg total) by mouth at bedtime as needed for sleep. ?  ?tretinoin 0.05 % gel ?Commonly known as: ALTRALIN ?Apply 1 application topically at bedtime. ?  ?Tri-Luma 0.01-4-0.05 % Crea ?Generic drug: Fluocin-Hydroquinone-Tretinoin ?Apply to affeced areas or dark spots once daily. ?What changed:  ?how much to take ?how to take this ?when to take this ?reasons to take this ?  ?Vitamin D3 50 MCG (2000 UT) Tabs ?Take 2,000 Units by mouth daily. ?  ? ?  ?  ? ? ?REVIEW OF SYSTEMS: ?A comprehensive ROS was conducted with the patient and is negative except as per HPI  ? ? ?OBJECTIVE:  ?VS: BP 112/70 (BP Location: Left Arm, Patient Position: Sitting, Cuff Size: Small)   Pulse 64   Ht $R'5\' 10"'PV$  (1.778 m)   Wt 145 lb (65.8 kg)   LMP 08/14/2012   SpO2 95%   BMI 20.81 kg/m?   ? ?Wt Readings from Last 3 Encounters:  ?06/20/21 145 lb (65.8 kg)  ?05/18/21 147 lb (66.7 kg)  ?05/04/21 144 lb 3.2 oz (65.4 kg)  ? ? ? ?EXAM: ?General: Pt appears well and is in NAD  ?Neck: General: Supple without adenopathy. ?Thyroid: Thyroid size normal.  No goiter or nodules appreciated.   ?Lungs: Clear with good BS bilat with no rales, rhonchi, or  wheezes  ?Heart: Auscultation: RRR.  ?Abdomen: Normoactive bowel sounds, soft, nontender, without masses or organomegaly palpable  ?Extremities:  ?BL LE: No pretibial edema normal ROM and strength.  ?Skin: Hair: Texture and amount normal with gender appropriate distribution ?Skin Inspection: No rashes ?Skin Palpation: Skin temperature, texture, and thickness normal to palpation  ?Mental Status: Judgment, insight: Intact ?Orientation: Oriented to time, place, and person ?Mood and affect: No depression, anxiety, or agitation  ? ? ? ?DATA REVIEWED: ? ? Latest Reference Range & Units 05/04/21 09:39  ?Sodium 135 - 146 mmol/L 140  ?  Potassium 3.5 - 5.3 mmol/L 4.8  ?Chloride 98 - 110 mmol/L 103  ?CO2 20 - 32 mmol/L 27  ?Glucose 65 - 99 mg/dL 86  ?BUN 7 - 25 mg/dL 20  ?Creatinine 0.50 - 1.03 mg/dL 0.64  ?Calcium 8.6 - 10.4 mg/dL 9.8  ?BUN/Creatinine Ratio 6 - 22 (calc) NOT APPLICABLE  ?eGFR > OR = 60 mL/min/1.76m2 102  ?AG Ratio 1.0 - 2.5 (calc) 2.0  ?AST 10 - 35 U/L 32  ?ALT 6 - 29 U/L 22  ?Total Protein 6.1 - 8.1 g/dL 6.8  ?Total Bilirubin 0.2 - 1.2 mg/dL 0.4  ? ? Latest Reference Range & Units 05/18/21 11:30  ?Calcium, 24H Urine mg/24 h 336 (H)  ?Creatinine, 24H Ur 0.50 - 2.15 g/24 h 0.73  ? ? ? ? ? ?DXA 10/05/2020 ?The BMD measured at [facility] L1-L4 is 0.825 g/cm2 with a T-score of ?-3.0. This patient is considered osteoporotic according to World ?Health Organization [facility]) criteria. ?  ?The quality of the exam is good. ?  ?Site Region Measured Date Measured Age YA BMD Significant CHANGE ?T-score ?AP Spine L1-L4 10/05/2020 59.1 -3.0 0.825 g/cm2 * ?[facility] Sp[facility]  L1-L4      08/11/2019    57.9         -2.5    0.874 g/cm2 ?  ?DualFemur Neck Left 10/05/2020 59.1 -2.3 0.716 g/cm2 * ?DualFemur Neck Left  08/11/2019    57.9         -1.9    0.772 g/cm2 ?  ?DualFemur Total Mean 10/05/2020 59.1 -2.0 0.754 g/cm2 * ?DualFemur Total Mean 08/11/2019    57.9         -1.7    0.787 g/cm2 ? ? ?ASSESSMENT/PLAN/RECOMMENDATIONS:   ? ?Osteoporosis: ? ?-Multifactorial given family history of osteoporosis, as well as hypercalciuria ?-We have opted to start her on Prolia, she received her first dose October 2022 ?-She has received her second dose today 06/20/2021

## 2021-06-21 ENCOUNTER — Other Ambulatory Visit (HOSPITAL_COMMUNITY): Payer: Self-pay

## 2021-06-21 ENCOUNTER — Ambulatory Visit: Payer: 59 | Admitting: Internal Medicine

## 2021-06-23 DIAGNOSIS — M542 Cervicalgia: Secondary | ICD-10-CM | POA: Diagnosis not present

## 2021-06-23 DIAGNOSIS — M25512 Pain in left shoulder: Secondary | ICD-10-CM | POA: Diagnosis not present

## 2021-06-26 DIAGNOSIS — M25512 Pain in left shoulder: Secondary | ICD-10-CM | POA: Diagnosis not present

## 2021-06-26 DIAGNOSIS — M542 Cervicalgia: Secondary | ICD-10-CM | POA: Diagnosis not present

## 2021-06-28 ENCOUNTER — Ambulatory Visit: Payer: 59 | Admitting: Orthopaedic Surgery

## 2021-06-30 DIAGNOSIS — M25512 Pain in left shoulder: Secondary | ICD-10-CM | POA: Diagnosis not present

## 2021-06-30 DIAGNOSIS — M542 Cervicalgia: Secondary | ICD-10-CM | POA: Diagnosis not present

## 2021-07-04 DIAGNOSIS — M25512 Pain in left shoulder: Secondary | ICD-10-CM | POA: Diagnosis not present

## 2021-07-04 DIAGNOSIS — M542 Cervicalgia: Secondary | ICD-10-CM | POA: Diagnosis not present

## 2021-07-06 NOTE — Progress Notes (Signed)
[facility] [place] [address] [place] Phone: [phone] Subjective:   IVilma Meckel, am serving as a scribe for Dr. Hulan Saas. This visit occurred during the SARS-CoV-2 public health emergency.  Safety protocols were in place, including screening questions prior to the visit, additional usage of staff PPE, and extensive cleaning of exam room while observing appropriate contact time as indicated for disinfecting solutions.   I'm seeing this patient by the request  of:  [name], [name], NP  CC: back and neck pain   [id number]  BERENISE HUNTON is a 61 y.o. female coming in with complaint of back and neck pain. OMT on 3/30/203. Patient states same per usual.  Patient states to she is little worried because she is still having some neurocognitive deficits.  Feels like from time to time she is having worsening symptoms of memory.  Having difficulty with just feeling like herself and wants to know if it is from the pain or something else going on.  Medications patient has been prescribed: None  Taking:         Reviewed prior external information including notes and imaging from previsou exam, outside providers and external EMR if available.   As well as notes that were available from care everywhere and other healthcare systems.  Past medical history, social, surgical and family history all reviewed in electronic medical record.  No pertanent information unless stated regarding to the chief complaint.   Past Medical History:  Diagnosis Date   Anemia    Arthritis    Hyperlipidemia    Insomnia    Mitral valve prolapse    PONV (postoperative nausea and vomiting)     Allergies  Allergen Reactions   Codeine Nausea And Vomiting   Hydrocodone Nausea And Vomiting   Triple Antibiotic Pain Relief [Neomy-Bacit-Polymyx-Pramoxine] Other (See Comments)    Burn   Triple Antibiotic W/Hydrocortisone  [Bacitra-Neomycin-Polymyxin-Hc]     Contrast Media [Iodinated Contrast Media] Hives    After CT scan with contrast, pt had MRI without contrast. Pt informed MRI Tech that she had a rash on her legs. MRI Tech spoke with Radiologist after MRI was completed. Per Rad, since patient was here x 30 minutes after CT contrast and was doing fine, with rash fading, pt was allowed to leave facility. Alert and oriented. jmh     Review of Systems:  No headache, visual changes, nausea, vomiting, diarrhea, constipation, dizziness, abdominal pain, skin rash, fevers, chills, night sweats, weight loss, swollen lymph nodes, body aches, joint swelling, chest pain, shortness of breath, mood changes. POSITIVE muscle aches, difficulty with concentration, patient feels like she is also having difficulty with memory  Objective  Blood pressure 102/66, height '5\' 10"'$  (1.778 m), weight 146 lb (66.2 kg), last menstrual period 08/14/2012.   General: No apparent distress alert and oriented x3 mood and affect normal, dressed appropriately.  HEENT: Pupils equal, extraocular movements intact  Respiratory: Patient's speak in full sentences and does not appear short of breath  Cardiovascular: No lower extremity edema, non tender, no erythema  Continues to have arthritic changes.  Patient does have significant tightness of multiple muscles.  Neck exam has very mild limited range of motion in all planes.  Osteopathic findings  C2 flexed rotated and side bent right C5 flexed rotated and side bent left T3 extended rotated and side bent right inhaled rib T9 extended rotated and side bent left L2 flexed rotated and side bent right Sacrum right  on right       Assessment and Plan:  DDD (degenerative disc disease), cervical Continues to have difficulty.  Chronic problem with exacerbation.  Has significant difficulty with multiple different joints having pain.  Patient has not responded extremely well to anything we have done at this moment.  We discussed which  activities to do and which ones to avoid. Follow-up again in 6 to 8 weeks.  Polyarthralgia Patient does have some polyarthropathy and we will continue to monitor.  Very difficult.  Has had the hypercalcemia and is supposed to be considering other medications.  We discussed with her that this could be beneficial.  Follow-up with me again in 6 to 8 weeks otherwise.  Neurocognitive deficits Patient states that she is having more difficulty with memory and word finding.  Patient does not know if this is from stress, pain, or something more difficult.  Patient has had multiple other problems that seem to be out of proportion.  At this point will refer to behavioral health for neurocognitive testing to see if this point since it in any direction that would help [place] with the management of her other problems as well as medication choices.   Nonallopathic problems  Decision today to treat with OMT was based on Physical Exam  After verbal consent patient was treated with  ME, FPR techniques in cervical, rib, thoracic, lumbar, and sacral  areas patient was so tight that unable to make significant movement secondary to the tightness today.  Patient tolerated the procedure well with improvement in symptoms  Patient given exercises, stretches and lifestyle modifications  See medications in patient instructions if given  Patient will follow up in 4-8 weeks      The above documentation has been reviewed and is accurate and complete Lyndal Pulley, DO        Note: This dictation was prepared with Dragon dictation along with smaller phrase technology. Any transcriptional errors that result from this process are unintentional.

## 2021-07-07 ENCOUNTER — Ambulatory Visit (INDEPENDENT_AMBULATORY_CARE_PROVIDER_SITE_OTHER): Payer: 59 | Admitting: Family Medicine

## 2021-07-07 VITALS — BP 102/66 | Ht 70.0 in | Wt 146.0 lb

## 2021-07-07 DIAGNOSIS — M2550 Pain in unspecified joint: Secondary | ICD-10-CM

## 2021-07-07 DIAGNOSIS — R29818 Other symptoms and signs involving the nervous system: Secondary | ICD-10-CM | POA: Diagnosis not present

## 2021-07-07 DIAGNOSIS — M9903 Segmental and somatic dysfunction of lumbar region: Secondary | ICD-10-CM | POA: Diagnosis not present

## 2021-07-07 DIAGNOSIS — M5030 Other cervical disc degeneration, unspecified cervical region: Secondary | ICD-10-CM | POA: Diagnosis not present

## 2021-07-07 DIAGNOSIS — M9908 Segmental and somatic dysfunction of rib cage: Secondary | ICD-10-CM | POA: Diagnosis not present

## 2021-07-07 DIAGNOSIS — M25512 Pain in left shoulder: Secondary | ICD-10-CM | POA: Diagnosis not present

## 2021-07-07 DIAGNOSIS — M9901 Segmental and somatic dysfunction of cervical region: Secondary | ICD-10-CM | POA: Diagnosis not present

## 2021-07-07 DIAGNOSIS — M9904 Segmental and somatic dysfunction of sacral region: Secondary | ICD-10-CM | POA: Diagnosis not present

## 2021-07-07 DIAGNOSIS — R41840 Attention and concentration deficit: Secondary | ICD-10-CM

## 2021-07-07 DIAGNOSIS — M9902 Segmental and somatic dysfunction of thoracic region: Secondary | ICD-10-CM

## 2021-07-07 DIAGNOSIS — R4189 Other symptoms and signs involving cognitive functions and awareness: Secondary | ICD-10-CM

## 2021-07-07 DIAGNOSIS — M542 Cervicalgia: Secondary | ICD-10-CM | POA: Diagnosis not present

## 2021-07-07 NOTE — Assessment & Plan Note (Signed)
Patient does have some polyarthropathy and we will continue to monitor.  Very difficult.  Has had the hypercalcemia and is supposed to be considering other medications.  We discussed with her that this could be beneficial.  Follow-up with me again in 6 to 8 weeks otherwise.

## 2021-07-07 NOTE — Assessment & Plan Note (Signed)
Continues to have difficulty.  Chronic problem with exacerbation.  Has significant difficulty with multiple different joints having pain.  Patient has not responded extremely well to anything we have done at this moment.  We discussed which activities to do and which ones to avoid. Follow-up again in 6 to 8 weeks.

## 2021-07-07 NOTE — Assessment & Plan Note (Signed)
Patient states that she is having more difficulty with memory and word finding.  Patient does not know if this is from stress, pain, or something more difficult.  Patient has had multiple other problems that seem to be out of proportion.  At this point will refer to behavioral health for neurocognitive testing to see if this point since it in any direction that would help [place] with the management of her other problems as well as medication choices.

## 2021-07-07 NOTE — Patient Instructions (Addendum)
Referral to behavioral health Lets give the Calcium another 3 months See you again in 7-8 weeks

## 2021-07-11 ENCOUNTER — Other Ambulatory Visit: Payer: Self-pay

## 2021-07-11 ENCOUNTER — Encounter: Payer: Self-pay | Admitting: Family Medicine

## 2021-07-11 DIAGNOSIS — M25512 Pain in left shoulder: Secondary | ICD-10-CM | POA: Diagnosis not present

## 2021-07-11 DIAGNOSIS — M542 Cervicalgia: Secondary | ICD-10-CM | POA: Diagnosis not present

## 2021-07-11 DIAGNOSIS — R41840 Attention and concentration deficit: Secondary | ICD-10-CM

## 2021-07-14 DIAGNOSIS — M25512 Pain in left shoulder: Secondary | ICD-10-CM | POA: Diagnosis not present

## 2021-07-14 DIAGNOSIS — M542 Cervicalgia: Secondary | ICD-10-CM | POA: Diagnosis not present

## 2021-07-16 ENCOUNTER — Encounter: Payer: Self-pay | Admitting: Podiatry

## 2021-07-18 DIAGNOSIS — M25512 Pain in left shoulder: Secondary | ICD-10-CM | POA: Diagnosis not present

## 2021-07-18 DIAGNOSIS — M542 Cervicalgia: Secondary | ICD-10-CM | POA: Diagnosis not present

## 2021-07-20 DIAGNOSIS — M25512 Pain in left shoulder: Secondary | ICD-10-CM | POA: Diagnosis not present

## 2021-07-20 DIAGNOSIS — M542 Cervicalgia: Secondary | ICD-10-CM | POA: Diagnosis not present

## 2021-07-21 ENCOUNTER — Other Ambulatory Visit: Payer: Self-pay

## 2021-07-21 DIAGNOSIS — R29818 Other symptoms and signs involving the nervous system: Secondary | ICD-10-CM

## 2021-07-21 NOTE — Telephone Encounter (Signed)
Last Prolia inj 06/20/21 Next Prolia inj due 12/22/21

## 2021-07-28 DIAGNOSIS — M25512 Pain in left shoulder: Secondary | ICD-10-CM | POA: Diagnosis not present

## 2021-07-28 DIAGNOSIS — M542 Cervicalgia: Secondary | ICD-10-CM | POA: Diagnosis not present

## 2021-08-01 ENCOUNTER — Other Ambulatory Visit: Payer: Self-pay

## 2021-08-01 DIAGNOSIS — R41840 Attention and concentration deficit: Secondary | ICD-10-CM

## 2021-08-01 DIAGNOSIS — M542 Cervicalgia: Secondary | ICD-10-CM | POA: Diagnosis not present

## 2021-08-01 DIAGNOSIS — M25512 Pain in left shoulder: Secondary | ICD-10-CM | POA: Diagnosis not present

## 2021-08-04 DIAGNOSIS — M25512 Pain in left shoulder: Secondary | ICD-10-CM | POA: Diagnosis not present

## 2021-08-04 DIAGNOSIS — M542 Cervicalgia: Secondary | ICD-10-CM | POA: Diagnosis not present

## 2021-08-08 DIAGNOSIS — M542 Cervicalgia: Secondary | ICD-10-CM | POA: Diagnosis not present

## 2021-08-08 DIAGNOSIS — M25512 Pain in left shoulder: Secondary | ICD-10-CM | POA: Diagnosis not present

## 2021-08-11 DIAGNOSIS — M25512 Pain in left shoulder: Secondary | ICD-10-CM | POA: Diagnosis not present

## 2021-08-11 DIAGNOSIS — M542 Cervicalgia: Secondary | ICD-10-CM | POA: Diagnosis not present

## 2021-08-15 DIAGNOSIS — M542 Cervicalgia: Secondary | ICD-10-CM | POA: Diagnosis not present

## 2021-08-15 DIAGNOSIS — M25512 Pain in left shoulder: Secondary | ICD-10-CM | POA: Diagnosis not present

## 2021-08-16 DIAGNOSIS — R600 Localized edema: Secondary | ICD-10-CM | POA: Diagnosis not present

## 2021-08-16 DIAGNOSIS — I83893 Varicose veins of bilateral lower extremities with other complications: Secondary | ICD-10-CM | POA: Diagnosis not present

## 2021-08-17 DIAGNOSIS — M542 Cervicalgia: Secondary | ICD-10-CM | POA: Diagnosis not present

## 2021-08-17 DIAGNOSIS — M25512 Pain in left shoulder: Secondary | ICD-10-CM | POA: Diagnosis not present

## 2021-08-17 NOTE — Progress Notes (Signed)
[place] Phone: [phone] Subjective:   Fontaine No, am serving as a scribe for Dr. Hulan Saas.  I'm seeing this patient by the request  of:  [name], [name], NP  CC: Back and neck pain follow-up  [id number]  Tara [name] is a 61 y.o. female coming in with complaint of back and neck pain. OMT on 07/07/2021. Patient states   Medications patient has been prescribed: None  Taking:         Reviewed prior external information including notes and imaging from previsou exam, outside providers and external EMR if available.   As well as notes that were available from care everywhere and other healthcare systems.  Past medical history, social, surgical and family history all reviewed in electronic medical record.  No pertanent information unless stated regarding to the chief complaint.   Past Medical History:  Diagnosis Date   Anemia    Arthritis    Hyperlipidemia    Insomnia    Mitral valve prolapse    PONV (postoperative nausea and vomiting)     Allergies  Allergen Reactions   Codeine Nausea And Vomiting   Hydrocodone Nausea And Vomiting   Triple Antibiotic Pain Relief [Neomy-Bacit-Polymyx-Pramoxine] Other (See Comments)    Burn   Triple Antibiotic W/Hydrocortisone  [Bacitra-Neomycin-Polymyxin-Hc]    Contrast Media [Iodinated Contrast Media] Hives    After CT scan with contrast, pt had MRI without contrast. Pt informed MRI Tech that she had a rash on her legs. MRI Tech spoke with Radiologist after MRI was completed. Per Rad, since patient was here x 30 minutes after CT contrast and was doing fine, with rash fading, pt was allowed to leave facility. Alert and oriented. jmh     Review of Systems:  No headache, visual changes, nausea, vomiting, diarrhea, constipation, dizziness, abdominal pain, skin rash, fevers, chills, night sweats, weight loss, swollen lymph nodes, joint swelling,  chest pain, shortness of breath, mood changes. POSITIVE muscle aches, body aches  Objective  Blood pressure 96/62, pulse 73, height '5\' 10"'$  (1.778 m), weight 149 lb (67.6 kg), last menstrual period 08/14/2012, peak flow 98 L/min.   General: No apparent distress alert and oriented x3 mood and affect normal, dressed appropriately.  HEENT: Pupils equal, extraocular movements intact  Respiratory: Patient's speak in full sentences and does not appear short of breath  Cardiovascular: No lower extremity edema, non tender, no erythema  Gait mild antalgic MSK:  Back does have loss of lordosis noted.  Significant tightness noted of the hips bilaterally as well. Negative straight leg test  Osteopathic findings  C3 flexed rotated and side bent right C5 flexed rotated and side bent left T3 extended rotated and side bent right inhaled rib T7 extended rotated and side bent left L2 flexed rotated and side bent right L5 flexed rotated and side bent left Sacrum right on right       Assessment and Plan:  Low back pain Continues to have low back pain overall.  Describes the pain as a dull, throbbing aching sensation still.  Still can affect her daily activities from time to time.  We have not found anything does not show any help with her pain at the moment.  Continuing with the osteopathic manipulation but does buy some time.    Nonallopathic problems  Decision today to treat with OMT was based on Physical Exam  After verbal consent patient was treated with HVLA, ME, FPR  techniques in cervical, rib, thoracic, lumbar, and sacral  areas  Patient tolerated the procedure well with improvement in symptoms  Patient given exercises, stretches and lifestyle modifications  See medications in patient instructions if given  Patient will follow up in 4-8 weeks    The above documentation has been reviewed and is accurate and complete Tara Pulley, DO          Note: This dictation was  prepared with Dragon dictation along with smaller phrase technology. Any transcriptional errors that result from this process are unintentional.

## 2021-08-18 ENCOUNTER — Ambulatory Visit (INDEPENDENT_AMBULATORY_CARE_PROVIDER_SITE_OTHER): Payer: 59 | Admitting: Family Medicine

## 2021-08-18 ENCOUNTER — Encounter: Payer: Self-pay | Admitting: Family Medicine

## 2021-08-18 VITALS — BP 96/62 | HR 73 | Ht 70.0 in | Wt 149.0 lb

## 2021-08-18 DIAGNOSIS — G8929 Other chronic pain: Secondary | ICD-10-CM

## 2021-08-18 DIAGNOSIS — M9902 Segmental and somatic dysfunction of thoracic region: Secondary | ICD-10-CM | POA: Diagnosis not present

## 2021-08-18 DIAGNOSIS — M9908 Segmental and somatic dysfunction of rib cage: Secondary | ICD-10-CM | POA: Diagnosis not present

## 2021-08-18 DIAGNOSIS — M9901 Segmental and somatic dysfunction of cervical region: Secondary | ICD-10-CM | POA: Diagnosis not present

## 2021-08-18 DIAGNOSIS — M5442 Lumbago with sciatica, left side: Secondary | ICD-10-CM

## 2021-08-18 DIAGNOSIS — M9904 Segmental and somatic dysfunction of sacral region: Secondary | ICD-10-CM | POA: Diagnosis not present

## 2021-08-18 DIAGNOSIS — M9903 Segmental and somatic dysfunction of lumbar region: Secondary | ICD-10-CM | POA: Diagnosis not present

## 2021-08-18 NOTE — Patient Instructions (Signed)
See me in 6-8 weeks Happy Birthday!

## 2021-08-18 NOTE — Assessment & Plan Note (Signed)
Continues to have low back pain overall.  Describes the pain as a dull, throbbing aching sensation still.  Still can affect her daily activities from time to time.  We have not found anything does not show any help with her pain at the moment.  Continuing with the osteopathic manipulation but does buy some time.

## 2021-08-25 DIAGNOSIS — M25512 Pain in left shoulder: Secondary | ICD-10-CM | POA: Diagnosis not present

## 2021-08-25 DIAGNOSIS — M542 Cervicalgia: Secondary | ICD-10-CM | POA: Diagnosis not present

## 2021-08-29 DIAGNOSIS — M542 Cervicalgia: Secondary | ICD-10-CM | POA: Diagnosis not present

## 2021-08-29 DIAGNOSIS — M25512 Pain in left shoulder: Secondary | ICD-10-CM | POA: Diagnosis not present

## 2021-09-01 DIAGNOSIS — M542 Cervicalgia: Secondary | ICD-10-CM | POA: Diagnosis not present

## 2021-09-01 DIAGNOSIS — M25512 Pain in left shoulder: Secondary | ICD-10-CM | POA: Diagnosis not present

## 2021-09-05 DIAGNOSIS — M542 Cervicalgia: Secondary | ICD-10-CM | POA: Diagnosis not present

## 2021-09-05 DIAGNOSIS — M25512 Pain in left shoulder: Secondary | ICD-10-CM | POA: Diagnosis not present

## 2021-09-05 MED ORDER — THEANINE 50 MG PO TBDP: 1.0000 | ORAL_TABLET | Freq: Every day | ORAL | Status: DC

## 2021-09-05 MED ORDER — MELATONIN 3 MG PO TABS: 3.0000 mg | ORAL_TABLET | Freq: Every day | ORAL | 0 refills | Status: DC

## 2021-09-05 MED ORDER — AMBULATORY NON FORMULARY MEDICATION: Status: AC

## 2021-09-05 MED ORDER — AMBULATORY NON FORMULARY MEDICATION: Status: DC

## 2021-09-08 DIAGNOSIS — M25512 Pain in left shoulder: Secondary | ICD-10-CM | POA: Diagnosis not present

## 2021-09-08 DIAGNOSIS — M542 Cervicalgia: Secondary | ICD-10-CM | POA: Diagnosis not present

## 2021-09-11 ENCOUNTER — Other Ambulatory Visit: Payer: Self-pay | Admitting: Family

## 2021-09-11 ENCOUNTER — Other Ambulatory Visit: Payer: Self-pay | Admitting: Internal Medicine

## 2021-09-11 DIAGNOSIS — Z1231 Encounter for screening mammogram for malignant neoplasm of breast: Secondary | ICD-10-CM

## 2021-09-12 DIAGNOSIS — M25512 Pain in left shoulder: Secondary | ICD-10-CM | POA: Diagnosis not present

## 2021-09-12 DIAGNOSIS — M542 Cervicalgia: Secondary | ICD-10-CM | POA: Diagnosis not present

## 2021-09-15 DIAGNOSIS — M542 Cervicalgia: Secondary | ICD-10-CM | POA: Diagnosis not present

## 2021-09-15 DIAGNOSIS — M25512 Pain in left shoulder: Secondary | ICD-10-CM | POA: Diagnosis not present

## 2021-09-19 MED ORDER — FISH OIL 1000 MG PO CAPS: 1000.0000 mg | ORAL_CAPSULE | Freq: Every day | ORAL | 0 refills | Status: DC

## 2021-09-20 DIAGNOSIS — I87393 Chronic venous hypertension (idiopathic) with other complications of bilateral lower extremity: Secondary | ICD-10-CM | POA: Diagnosis not present

## 2021-09-22 DIAGNOSIS — M542 Cervicalgia: Secondary | ICD-10-CM | POA: Diagnosis not present

## 2021-09-22 DIAGNOSIS — M25512 Pain in left shoulder: Secondary | ICD-10-CM | POA: Diagnosis not present

## 2021-09-29 DIAGNOSIS — M542 Cervicalgia: Secondary | ICD-10-CM | POA: Diagnosis not present

## 2021-09-29 DIAGNOSIS — M25512 Pain in left shoulder: Secondary | ICD-10-CM | POA: Diagnosis not present

## 2021-10-02 ENCOUNTER — Other Ambulatory Visit: Payer: 59

## 2021-10-03 DIAGNOSIS — M542 Cervicalgia: Secondary | ICD-10-CM | POA: Diagnosis not present

## 2021-10-03 DIAGNOSIS — M25512 Pain in left shoulder: Secondary | ICD-10-CM | POA: Diagnosis not present

## 2021-10-04 ENCOUNTER — Ambulatory Visit: Payer: 59 | Admitting: Orthopaedic Surgery

## 2021-10-06 DIAGNOSIS — M25512 Pain in left shoulder: Secondary | ICD-10-CM | POA: Diagnosis not present

## 2021-10-06 DIAGNOSIS — M542 Cervicalgia: Secondary | ICD-10-CM | POA: Diagnosis not present

## 2021-10-09 ENCOUNTER — Other Ambulatory Visit (HOSPITAL_COMMUNITY): Payer: Self-pay

## 2021-10-10 ENCOUNTER — Ambulatory Visit
Admission: RE | Admit: 2021-10-10 | Discharge: 2021-10-10 | Disposition: A | Discharge status: Home / Self Care | Payer: 59 | Source: Ambulatory Visit | Attending: Family | Admitting: Family

## 2021-10-10 ENCOUNTER — Other Ambulatory Visit (HOSPITAL_COMMUNITY): Payer: Self-pay

## 2021-10-10 DIAGNOSIS — M25512 Pain in left shoulder: Secondary | ICD-10-CM | POA: Diagnosis not present

## 2021-10-10 DIAGNOSIS — Z1231 Encounter for screening mammogram for malignant neoplasm of breast: Secondary | ICD-10-CM | POA: Diagnosis not present

## 2021-10-10 DIAGNOSIS — M542 Cervicalgia: Secondary | ICD-10-CM | POA: Diagnosis not present

## 2021-10-11 DIAGNOSIS — I8312 Varicose veins of left lower extremity with inflammation: Secondary | ICD-10-CM | POA: Diagnosis not present

## 2021-10-11 DIAGNOSIS — R252 Cramp and spasm: Secondary | ICD-10-CM | POA: Diagnosis not present

## 2021-10-11 DIAGNOSIS — I8311 Varicose veins of right lower extremity with inflammation: Secondary | ICD-10-CM | POA: Diagnosis not present

## 2021-10-11 DIAGNOSIS — R600 Localized edema: Secondary | ICD-10-CM | POA: Diagnosis not present

## 2021-10-12 NOTE — Telephone Encounter (Signed)
I recommend follow up with cardiology to discuss test.

## 2021-10-13 ENCOUNTER — Ambulatory Visit: Payer: 59 | Admitting: Family Medicine

## 2021-10-13 DIAGNOSIS — M542 Cervicalgia: Secondary | ICD-10-CM | POA: Diagnosis not present

## 2021-10-13 DIAGNOSIS — M25512 Pain in left shoulder: Secondary | ICD-10-CM | POA: Diagnosis not present

## 2021-10-16 DIAGNOSIS — E7800 Pure hypercholesterolemia, unspecified: Secondary | ICD-10-CM

## 2021-10-17 DIAGNOSIS — M542 Cervicalgia: Secondary | ICD-10-CM | POA: Diagnosis not present

## 2021-10-17 DIAGNOSIS — M25512 Pain in left shoulder: Secondary | ICD-10-CM | POA: Diagnosis not present

## 2021-10-17 NOTE — Telephone Encounter (Signed)
What dose of Black cohosh are you taking? I see 160 mg and 40 mg capsule

## 2021-10-17 NOTE — Telephone Encounter (Signed)
Cardiology referral ordered 

## 2021-10-18 ENCOUNTER — Ambulatory Visit: Payer: 59 | Admitting: Internal Medicine

## 2021-10-24 DIAGNOSIS — M25512 Pain in left shoulder: Secondary | ICD-10-CM | POA: Diagnosis not present

## 2021-10-24 DIAGNOSIS — M542 Cervicalgia: Secondary | ICD-10-CM | POA: Diagnosis not present

## 2021-10-27 DIAGNOSIS — M25512 Pain in left shoulder: Secondary | ICD-10-CM | POA: Diagnosis not present

## 2021-10-27 DIAGNOSIS — M542 Cervicalgia: Secondary | ICD-10-CM | POA: Diagnosis not present

## 2021-10-31 DIAGNOSIS — M25512 Pain in left shoulder: Secondary | ICD-10-CM | POA: Diagnosis not present

## 2021-10-31 DIAGNOSIS — M542 Cervicalgia: Secondary | ICD-10-CM | POA: Diagnosis not present

## 2021-11-07 DIAGNOSIS — M25512 Pain in left shoulder: Secondary | ICD-10-CM | POA: Diagnosis not present

## 2021-11-07 DIAGNOSIS — M542 Cervicalgia: Secondary | ICD-10-CM | POA: Diagnosis not present

## 2021-11-10 DIAGNOSIS — M25512 Pain in left shoulder: Secondary | ICD-10-CM | POA: Diagnosis not present

## 2021-11-10 DIAGNOSIS — H524 Presbyopia: Secondary | ICD-10-CM | POA: Diagnosis not present

## 2021-11-10 DIAGNOSIS — M542 Cervicalgia: Secondary | ICD-10-CM | POA: Diagnosis not present

## 2021-11-14 DIAGNOSIS — M25512 Pain in left shoulder: Secondary | ICD-10-CM | POA: Diagnosis not present

## 2021-11-14 DIAGNOSIS — M542 Cervicalgia: Secondary | ICD-10-CM | POA: Diagnosis not present

## 2021-11-16 DIAGNOSIS — M542 Cervicalgia: Secondary | ICD-10-CM | POA: Diagnosis not present

## 2021-11-16 DIAGNOSIS — M25512 Pain in left shoulder: Secondary | ICD-10-CM | POA: Diagnosis not present

## 2021-11-25 NOTE — Telephone Encounter (Signed)
Prolia VOB initiated via [email or web address]  Last Prolia inj 06/20/21 Next Prolia inj due 12/22/21

## 2021-11-29 DIAGNOSIS — M25512 Pain in left shoulder: Secondary | ICD-10-CM | POA: Diagnosis not present

## 2021-11-29 DIAGNOSIS — M542 Cervicalgia: Secondary | ICD-10-CM | POA: Diagnosis not present

## 2021-12-04 DIAGNOSIS — I83811 Varicose veins of right lower extremities with pain: Secondary | ICD-10-CM | POA: Diagnosis not present

## 2021-12-04 DIAGNOSIS — I83891 Varicose veins of right lower extremities with other complications: Secondary | ICD-10-CM | POA: Diagnosis not present

## 2021-12-06 DIAGNOSIS — M542 Cervicalgia: Secondary | ICD-10-CM | POA: Diagnosis not present

## 2021-12-06 DIAGNOSIS — M25512 Pain in left shoulder: Secondary | ICD-10-CM | POA: Diagnosis not present

## 2021-12-07 ENCOUNTER — Encounter: Payer: Self-pay | Admitting: Family Medicine

## 2021-12-11 ENCOUNTER — Other Ambulatory Visit: Payer: 59

## 2021-12-12 ENCOUNTER — Encounter: Payer: Self-pay | Admitting: Internal Medicine

## 2021-12-13 ENCOUNTER — Other Ambulatory Visit (INDEPENDENT_AMBULATORY_CARE_PROVIDER_SITE_OTHER): Payer: 59

## 2021-12-13 DIAGNOSIS — M542 Cervicalgia: Secondary | ICD-10-CM | POA: Diagnosis not present

## 2021-12-13 DIAGNOSIS — M25512 Pain in left shoulder: Secondary | ICD-10-CM | POA: Diagnosis not present

## 2021-12-13 DIAGNOSIS — M810 Age-related osteoporosis without current pathological fracture: Secondary | ICD-10-CM

## 2021-12-13 DIAGNOSIS — R82994 Hypercalciuria: Secondary | ICD-10-CM | POA: Diagnosis not present

## 2021-12-13 LAB — BASIC METABOLIC PANEL
BUN: 22 mg/dL (ref 6–23)
CO2: 26 mEq/L (ref 19–32)
Calcium: 9.7 mg/dL (ref 8.4–10.5)
Chloride: 103 mEq/L (ref 96–112)
Creatinine, Ser: 0.54 mg/dL (ref 0.40–1.20)
GFR: 100.14 mL/min (ref >60.00)
Glucose, Bld: 107 mg/dL — ABNORMAL HIGH (ref 70–99)
Potassium: 4.3 mEq/L (ref 3.5–5.1)
Sodium: 135 mEq/L (ref 135–145)

## 2021-12-13 LAB — VITAMIN D 25 HYDROXY (VIT D DEFICIENCY, FRACTURES): VITD: 32.6 ng/mL (ref 30.00–100.00)

## 2021-12-13 LAB — T4, FREE: Free T4: 0.87 ng/dL (ref 0.60–1.60)

## 2021-12-13 LAB — TSH: TSH: 1.55 u[IU]/mL (ref 0.35–5.50)

## 2021-12-13 NOTE — Progress Notes (Unsigned)
Total volume 6,550. Started 12-12-2021 at 6:15 am, ended 12-13-2021 at 6:10 a.m.

## 2021-12-13 NOTE — Telephone Encounter (Signed)
Advised patient when she came in for lab

## 2021-12-14 LAB — CALCIUM, URINE, 24 HOUR: Calcium, 24H Urine: 334 mg/24 h — ABNORMAL HIGH

## 2021-12-14 LAB — CREATININE, URINE, 24 HOUR: Creatinine, 24H Ur: 1.18 g/(24.h) (ref 0.50–2.15)

## 2021-12-14 NOTE — Progress Notes (Signed)
Chief Complaint  Patient presents with   New Patient (Initial Visit)    Mitral valve disease    History of Present Illness:61 yo female with history of anemia, arthritis, hyperlipidemia and mitral valve prolapse/mitral regurgitation who is here today as a new patient for the evaluation of her mitral valve disease. Echo in 2012 with normal LV function and mild to moderate mitral regurgitation. I have not seen her since 2012. She describes becoming very dyspneic with exertion. No chest pain. She had edema in both ankles at the end of the day.   Primary Care Physician: Sandrea Hughs, NP   Past Medical History:  Diagnosis Date   Anemia    Arthritis    Hyperlipidemia    Insomnia    Mitral valve prolapse    PONV (postoperative nausea and vomiting)     Past Surgical History:  Procedure Laterality Date   AUGMENTATION MAMMAPLASTY Bilateral    Dec 2019   BREAST BIOPSY Right    axillary node benign   KNEE SURGERY  1988   right   SHOULDER ARTHROSCOPY WITH LABRAL REPAIR Right 08/28/2012   Procedure: RIGHT SHOULDER ARTHROSCOPY WITH LABRAL DEBRIDEMENT, ASPIRATION AND DECOMPRESSION OF PARALABRAL CYST;  Surgeon: Marin Shutter, MD;  Location: [facility];  Service: Orthopedics;  Laterality: Right;   TOTAL SHOULDER ARTHROPLASTY Right 03/24/2020   Procedure: RIGHT REVERSE SHOULDER ARTHROPLASTY;  Surgeon: Justice Britain, MD;  Location: [facility];  Service: Orthopedics;  Laterality: Right;  141mn   TOTAL SHOULDER ARTHROPLASTY Left 02/23/2021   Procedure: TOTAL SHOULDER ARTHROPLASTY;  Surgeon: SJustice Britain MD;  Location: [facility];  Service: Orthopedics;  Laterality: Left;    Current Outpatient Medications  Medication Sig Dispense Refill   AMBULATORY NON FORMULARY MEDICATION Medication Name: Mindful Evening Cocoa Mix (Melatonin 3 mg, Theanine 5 mg, GABA 100 mg)     Black Cohosh 40 MG CAPS Take 1 capsule by mouth in the morning and at bedtime.     CALCIUM CITRATE PO Take 600 mg by mouth 2 (two) times  daily. Number varies each day based on calcium ingested in food     cholecalciferol (VITAMIN D3) 25 MCG (1000 UNIT) tablet Take 1,000 Units by mouth daily.     denosumab (PROLIA) 60 MG/ML SOSY injection Inject 60 mg into the skin every 6 (six) months.     Efinaconazole 10 % SOLN Apply 1 drop topically daily. (Patient taking differently: Apply 1 drop topically daily as needed (Toe Nail).) 4 mL 11   Fluocin-Hydroquinone-Tretinoin 0.01-4-0.05 % CREA Apply to affeced areas or dark spots once daily. (Patient taking differently: Apply 1 application  topically daily as needed (face).) 30 g 3   Multiple Vitamin (MULTIVITAMIN WITH MINERALS) TABS Take 1 tablet by mouth daily.     Omega-3 Fatty Acids (FISH OIL) 1000 MG CAPS Take 1 capsule (1,000 mg total) by mouth daily. 30 capsule 0   Probiotic Product (PROBIOTIC PO) Take 1 capsule by mouth daily.     Red Yeast Rice 600 MG CAPS Take 600 mg by mouth 2 (two) times daily.     tretinoin (ALTRALIN) 0.05 % gel Apply 1 application topically at bedtime. 45 g 3   diphenhydrAMINE (BENADRYL) 50 MG capsule Take one capsule 1 hour prior to scan. 1 capsule 0   predniSONE (DELTASONE) 50 MG tablet Take one tablet 13 hours, 7 hours, and 1 hour prior to scan. 3 tablet 0   No current facility-administered medications for this visit.    Allergies  Allergen Reactions   Codeine Nausea And Vomiting   Hydrocodone Nausea And Vomiting   Triple Antibiotic Pain Relief [Neomy-Bacit-Polymyx-Pramoxine] Other (See Comments)    Burn   Triple Antibiotic W/Hydrocortisone  [Bacitra-Neomycin-Polymyxin-Hc]    Contrast Media [Iodinated Contrast Media] Hives    After CT scan with contrast, pt had MRI without contrast. Pt informed MRI Tech that she had a rash on her legs. MRI Tech spoke with Radiologist after MRI was completed. Per Rad, since patient was here x 30 minutes after CT contrast and was doing fine, with rash fading, pt was allowed to leave facility. Alert and oriented. jmh     Social History   Socioeconomic History   Marital status: Married    Spouse name: Not on file   Number of children: 1   Years of education: Not on file   Highest education level: Not on file  Occupational History   Not on file  Tobacco Use   Smoking status: Never   Smokeless tobacco: Never  Vaping Use   Vaping Use: Never used  Substance and Sexual Activity   Alcohol use: No   Drug use: No   Sexual activity: Not on file  Other Topics Concern   Not on file  Social History Narrative   Tobacco use, amount per day now: 0   Past tobacco use, amount per day: 0   How many years did you use tobacco: 0   Alcohol use (drinks per week): 0   Diet: Heart Healthy/DASH Diet/ Lean and Clean type foods.   Do you drink/eat things with caffeine: 1 glass of Tea in the morning.   Marital status: Married                                 What year were you married? 1987   Do you live in a house, apartment, assisted living, condo, trailer, etc.? House   Is it one or more stories? Yes   How many persons live in your home? 2 ( Husband/ Self )   Do you have pets in your home?( please list) Not currently, Dog Deceased.    Current or past profession: PhD/RN   Do you exercise?    Yes                              Type and how often? 6 Days a week.   Do you have a living will? Yes   Do you have a DNR form?   No                               If not, do you want to discuss one? Yes   Do you have signed POA/HPOA forms? Yes                       If so, please bring to you appointment   Social Determinants of Health   Financial Resource Strain: Not on file  Food Insecurity: Not on file  Transportation Needs: Not on file  Physical Activity: Not on file  Stress: Not on file  Social Connections: Not on file  Intimate Partner Violence: Not on file    Family History  Problem Relation Age of Onset   Stroke Mother    Heart attack Brother 66  Sudden death Neg Hx    Hypertension Neg Hx     Hyperlipidemia Neg Hx    Diabetes Neg Hx    Breast cancer Neg Hx     Review of Systems:  As stated in the HPI and otherwise negative.   BP 110/70   Pulse (!) 55   Ht '5\' 10"'$  (1.778 m)   Wt 145 lb 12.8 oz (66.1 kg)   LMP 08/14/2012   SpO2 98%   BMI 20.92 kg/m   Physical Examination: General: Well developed, well nourished, NAD  HEENT: OP clear, mucus membranes moist  SKIN: warm, dry. No rashes. Neuro: No focal deficits  Musculoskeletal: Muscle strength 5/5 all ext  Psychiatric: Mood and affect normal  Neck: No JVD, no carotid bruits, no thyromegaly, no lymphadenopathy.  Lungs:Clear bilaterally, no wheezes, rhonci, crackles Cardiovascular: Regular rate and rhythm. No murmurs, gallops or rubs. Abdomen:Soft. Bowel sounds present. Non-tender.  Extremities: No lower extremity edema. Pulses are 2 + in the bilateral DP/PT.  EKG:  EKG is ordered today. The ekg ordered today demonstrates Sinus brady, rate 52 bpm. Poor R wave progression  Recent Labs: 05/04/2021: ALT 22; Hemoglobin 14.5; Platelets 212 12/13/2021: BUN 22; Creatinine, Ser 0.54; Potassium 4.3; Sodium 135; TSH 1.55   Lipid Panel    Component Value Date/Time   CHOL 169 05/04/2021 0939   TRIG 44 05/04/2021 0939   HDL 67 05/04/2021 0939   CHOLHDL 2.5 05/04/2021 0939   LDLCALC 89 05/04/2021 0939     Wt Readings from Last 3 Encounters:  12/15/21 145 lb 12.8 oz (66.1 kg)  08/18/21 149 lb (67.6 kg)  07/07/21 146 lb (66.2 kg)    Assessment and Plan:   1. Mitral regurgitation: Mild by echo remotely. Will repeat echo now.   2. Dyspnea on exertion: Echo as above. Coronary CTA to exclude CAD. Pre-treatment for contrast allergy with prednisone. BMET ok this week.   Labs/ tests ordered today include:   Orders Placed This Encounter  Procedures   CT CORONARY MORPH W/CTA COR W/SCORE W/CA W/CM &/OR WO/CM   EKG 12-Lead   ECHOCARDIOGRAM COMPLETE   Disposition:   F/U with me in one year  Signed, Lauree Chandler,  MD, [facility] 12/15/2021 11:41 AM    [facility] [place], [place],   [address] Phone: [phone]; Fax: [phone]

## 2021-12-15 ENCOUNTER — Ambulatory Visit: Payer: 59 | Attending: Cardiovascular Disease | Admitting: Cardiovascular Disease

## 2021-12-15 ENCOUNTER — Encounter: Payer: Self-pay | Admitting: Cardiovascular Disease

## 2021-12-15 ENCOUNTER — Other Ambulatory Visit (HOSPITAL_COMMUNITY): Payer: Self-pay

## 2021-12-15 VITALS — BP 110/70 | HR 55 | Ht 70.0 in | Wt 145.8 lb

## 2021-12-15 DIAGNOSIS — I340 Nonrheumatic mitral (valve) insufficiency: Secondary | ICD-10-CM

## 2021-12-15 DIAGNOSIS — R0602 Shortness of breath: Secondary | ICD-10-CM | POA: Diagnosis not present

## 2021-12-15 MED ORDER — DIPHENHYDRAMINE HCL 50 MG PO CAPS
ORAL_CAPSULE | ORAL | 0 refills | Status: DC
  Filled 2021-12-15: qty 1, 1d supply, fill #0

## 2021-12-15 MED ORDER — PREDNISONE 50 MG PO TABS
ORAL_TABLET | ORAL | 0 refills | Status: DC
  Filled 2021-12-15: qty 3, fill #0
  Filled 2021-12-20: qty 3, 1d supply, fill #0

## 2021-12-15 MED ORDER — DIPHENHYDRAMINE HCL 50 MG PO CAPS
ORAL_CAPSULE | ORAL | 0 refills | Status: DC
  Filled 2021-12-15: qty 1, fill #0

## 2021-12-15 MED ORDER — PREDNISONE 50 MG PO TABS
ORAL_TABLET | ORAL | 0 refills | Status: DC
  Filled 2021-12-15: qty 3, 1d supply, fill #0

## 2021-12-15 NOTE — Patient Instructions (Signed)
Medication Instructions:  Your physician recommends that you continue on your current medications as directed. Please refer to the Current Medication list given to you today.  *If you need a refill on your cardiac medications before your next appointment, please call your pharmacy*   Lab Work: none  Testing/Procedures: Your physician has requested that you have an echocardiogram. Echocardiography is a painless test that uses sound waves to create images of your heart. It provides your doctor with information about the size and shape of your heart and how well your heart's chambers and valves are working. This procedure takes approximately one hour. There are no restrictions for this procedure. Please do NOT wear cologne, perfume, aftershave, or lotions (deodorant is allowed). Please arrive 15 minutes prior to your appointment time.  Cardiac CTA - see instructions below   Follow-Up: At [facility], you and your health needs are our priority.  As part of our continuing mission to provide you with exceptional heart care, we have created designated Provider Care Teams.  These Care Teams include your primary Cardiologist (physician) and Advanced Practice Providers (APPs -  Physician Assistants and Nurse Practitioners) who all work together to provide you with the care you need, when you need it.   Your next appointment:   12 month(s)  The format for your next appointment:   In Person  Provider:   Lauree Chandler, MD        Your cardiac CT will be scheduled at   [facility] [place],  [address] [phone]  Please arrive at the [facility] and Children's Entrance (Entrance C2) of [facility] 30 minutes prior to test start time. You can use the FREE valet parking offered at entrance C (encouraged to control the heart rate for the test)  Proceed to the [facility] Radiology Department (first floor) to check-in and test  prep.  All radiology patients and guests should use entrance C2 at [facility], accessed from [facility], even though the hospital's physical address listed is [address]      Please follow these instructions carefully (unless otherwise directed):   On the Night Before the Test: Be sure to Drink plenty of water. Do not consume any caffeinated/decaffeinated beverages or chocolate 12 hours prior to your test. Do not take any antihistamines 12 hours prior to your test. If the patient has contrast allergy: Patient will need a prescription for Prednisone and very clear instructions (as follows): Prednisone 50 mg - take 13 hours prior to test Take another Prednisone 50 mg 7 hours prior to test Take another Prednisone 50 mg 1 hour prior to test Take Benadryl 50 mg 1 hour prior to test Patient must complete all four doses of above prophylactic medications. Patient will need a ride after test due to Benadryl.  On the Day of the Test: Drink plenty of water until 1 hour prior to the test. Do not eat any food 1 hour prior to test. You may take your regular medications prior to the test.  FEMALES- please wear underwire-free bra if available, avoid dresses & tight clothing      After the Test: Drink plenty of water. After receiving IV contrast, you may experience a mild flushed feeling. This is normal. On occasion, you may experience a mild rash up to 24 hours after the test. This is not dangerous. If this occurs, you can take Benadryl 25 mg and increase your fluid intake. If you  experience trouble breathing, this can be serious. If it is severe call 911 IMMEDIATELY. If it is mild, please call our office. If you take any of these medications: Glipizide/Metformin, Avandament, Glucavance, please do not take 48 hours after completing test unless otherwise instructed.  We will call to schedule your test 2-4 weeks out understanding that some insurance companies  will need an authorization prior to the service being performed.   For non-scheduling related questions, please contact the cardiac imaging nurse navigator should you have any questions/concerns: Marchia Bond, Cardiac Imaging Nurse Navigator Gordy Clement, Cardiac Imaging Nurse Navigator [facility] Direct Office Dial: [phone]   For scheduling needs, including cancellations and rescheduling, please call [place], [phone].

## 2021-12-19 ENCOUNTER — Encounter: Payer: Self-pay | Admitting: Cardiovascular Disease

## 2021-12-19 DIAGNOSIS — I83892 Varicose veins of left lower extremities with other complications: Secondary | ICD-10-CM | POA: Diagnosis not present

## 2021-12-20 ENCOUNTER — Other Ambulatory Visit (HOSPITAL_COMMUNITY): Payer: Self-pay

## 2021-12-20 DIAGNOSIS — M542 Cervicalgia: Secondary | ICD-10-CM | POA: Diagnosis not present

## 2021-12-20 DIAGNOSIS — M25512 Pain in left shoulder: Secondary | ICD-10-CM | POA: Diagnosis not present

## 2021-12-20 NOTE — Progress Notes (Unsigned)
[place] Phone: [phone] Subjective:   Fontaine No, am serving as a scribe for Dr. Hulan Saas.  I'm seeing this patient by the request  of:  [name], [name], NP  CC: back and neck pain f/u   [id number]  Tara [name] is a 61 y.o. female coming in with complaint of back and neck pain. OMT 08/18/2021. Patient states that  Medications patient has been prescribed: None  Taking:       Reviewed prior external information including notes and imaging from previsou exam, outside providers and external EMR if available.   As well as notes that were available from care everywhere and other healthcare systems.  Has seen cardiology recently and is getting a CT coronary in January.  Patient is also scheduled for an echocardiogram in January as well. Reviewed patient's note with endocrinology and continues to have hypercalciuria and was recommended to be on chlorthalidone  Past medical history, social, surgical and family history all reviewed in electronic medical record.  No pertanent information unless stated regarding to the chief complaint.   Past Medical History:  Diagnosis Date   Anemia    Arthritis    Hyperlipidemia    Insomnia    Mitral valve prolapse    PONV (postoperative nausea and vomiting)     Allergies  Allergen Reactions   Codeine Nausea And Vomiting   Hydrocodone Nausea And Vomiting   Triple Antibiotic Pain Relief [Neomy-Bacit-Polymyx-Pramoxine] Other (See Comments)    Burn   Triple Antibiotic W/Hydrocortisone  [Bacitra-Neomycin-Polymyxin-Hc]    Contrast Media [Iodinated Contrast Media] Hives    After CT scan with contrast, pt had MRI without contrast. Pt informed MRI Tech that she had a rash on her legs. MRI Tech spoke with Radiologist after MRI was completed. Per Rad, since patient was here x 30 minutes after CT contrast and was doing fine, with rash fading, pt was allowed to  leave facility. Alert and oriented. jmh     Review of Systems:  No headache, visual changes, nausea, vomiting, diarrhea, constipation, dizziness, abdominal pain, skin rash, fevers, chills, night sweats, weight loss, swollen lymph nodes, body aches, joint swelling, chest pain, shortness of breath, mood changes. POSITIVE muscle aches  Objective  Last menstrual period 08/14/2012.   General: No apparent distress alert and oriented x3 mood and affect normal, dressed appropriately.  HEENT: Pupils equal, extraocular movements intact  Respiratory: Patient's speak in full sentences and does not appear short of breath  Cardiovascular: No lower extremity edema, non tender, no erythema  Gait MSK:  Back   Osteopathic findings  C2 flexed rotated and side bent right C6 flexed rotated and side bent left T3 extended rotated and side bent right inhaled rib T9 extended rotated and side bent left L2 flexed rotated and side bent right Sacrum right on right       Assessment and Plan:  No problem-specific Assessment & Plan notes found for this encounter.    Nonallopathic problems  Decision today to treat with OMT was based on Physical Exam  After verbal consent patient was treated with HVLA, ME, FPR techniques in cervical, rib, thoracic, lumbar, and sacral  areas  Patient tolerated the procedure well with improvement in symptoms  Patient given exercises, stretches and lifestyle modifications  See medications in patient instructions if given  Patient will follow up in 4-8 weeks    The above documentation has been reviewed and is accurate and  complete Lyndal Pulley, DO          Note: This dictation was prepared with Dragon dictation along with smaller phrase technology. Any transcriptional errors that result from this process are unintentional.

## 2021-12-20 NOTE — Telephone Encounter (Signed)
Prior auth renewal required for [employer]  PA PROCESS DETAILS: Prior Authorization is Required. We are unable to confirm if a PA is on file. Please check your records or contact the payer.

## 2021-12-21 DIAGNOSIS — I83891 Varicose veins of right lower extremities with other complications: Secondary | ICD-10-CM | POA: Diagnosis not present

## 2021-12-21 DIAGNOSIS — I83811 Varicose veins of right lower extremities with pain: Secondary | ICD-10-CM | POA: Diagnosis not present

## 2021-12-25 ENCOUNTER — Ambulatory Visit (INDEPENDENT_AMBULATORY_CARE_PROVIDER_SITE_OTHER): Payer: 59 | Admitting: Family Medicine

## 2021-12-25 VITALS — BP 116/82 | HR 63 | Ht 70.0 in | Wt 145.0 lb

## 2021-12-25 DIAGNOSIS — M810 Age-related osteoporosis without current pathological fracture: Secondary | ICD-10-CM

## 2021-12-25 DIAGNOSIS — M9902 Segmental and somatic dysfunction of thoracic region: Secondary | ICD-10-CM | POA: Diagnosis not present

## 2021-12-25 DIAGNOSIS — M9908 Segmental and somatic dysfunction of rib cage: Secondary | ICD-10-CM

## 2021-12-25 DIAGNOSIS — M9901 Segmental and somatic dysfunction of cervical region: Secondary | ICD-10-CM

## 2021-12-25 DIAGNOSIS — M2550 Pain in unspecified joint: Secondary | ICD-10-CM

## 2021-12-25 DIAGNOSIS — M5030 Other cervical disc degeneration, unspecified cervical region: Secondary | ICD-10-CM | POA: Diagnosis not present

## 2021-12-25 DIAGNOSIS — M9904 Segmental and somatic dysfunction of sacral region: Secondary | ICD-10-CM | POA: Diagnosis not present

## 2021-12-25 LAB — SEDIMENTATION RATE: Sed Rate: 15 mm/hr (ref 0–30)

## 2021-12-25 LAB — URIC ACID: Uric Acid, Serum: 2.8 mg/dL (ref 2.4–7.0)

## 2021-12-25 LAB — VITAMIN B12: Vitamin B-12: 744 pg/mL (ref 211–911)

## 2021-12-25 NOTE — Patient Instructions (Signed)
Labs today See me in 6 weeks

## 2021-12-25 NOTE — Progress Notes (Unsigned)
Name: Tara [name]  MRN/ DOB: [id number], [birth year]    Age/ Sex: 61 y.o., female    PCP: [name], Nelda Bucks, NP   Reason for Endocrinology Evaluation: Osteoporosis      Date of Initial Endocrinology Evaluation: 10/28/2020    HPI: [name] is a 61 y.o. female with a past medical history of Osteoporosis, somatic dysfunction of the spine  . The patient presented for initial endocrinology clinic visit on 10/28/2020 for consultative assistance with her Osteoporosis .   Pt was diagnosed with osteoporosis:2021  Menarche at age : 46 Menopausal at age : 2017 Fracture Hx: no Hx of HRT: no FH of osteoporosis or hip fracture: mother  Prior Hx of anti-estrogenic therapy :no Prior Hx of anti-resorptive therapy : She was offered Fosamax in 2021 but opted with [facility] [place] program for 6 months .  She was started on Prolia October/14/2022  No prior hx of glucocorticoids    SUBJECTIVE:    Today (12/26/21):  Ms. Dejonge is here for a follow up on osteoporosis and hypercalciuria.  She continues to follow-up with sports medicine for polyarthralgias She was seen by cardiology on 12/15/2021  Denies recent falls  Denies constipation or diarrhea    Last prolia injection 5/2, 2023 She does do weight bearing exercise    Vitamin D 3 to 1000 IU daily    HISTORY:  Past Medical History:  Past Medical History:  Diagnosis Date   Anemia    Arthritis    Hyperlipidemia    Insomnia    Mitral valve prolapse    PONV (postoperative nausea and vomiting)    Past Surgical History:  Past Surgical History:  Procedure Laterality Date   AUGMENTATION MAMMAPLASTY Bilateral    Dec 2019   BREAST BIOPSY Right    axillary node benign   KNEE SURGERY  1988   right   SHOULDER ARTHROSCOPY WITH LABRAL REPAIR Right 08/28/2012   Procedure: RIGHT SHOULDER ARTHROSCOPY WITH LABRAL DEBRIDEMENT, ASPIRATION AND DECOMPRESSION OF PARALABRAL CYST;  Surgeon: Marin Shutter, MD;  Location: [facility];   Service: Orthopedics;  Laterality: Right;   TOTAL SHOULDER ARTHROPLASTY Right 03/24/2020   Procedure: RIGHT REVERSE SHOULDER ARTHROPLASTY;  Surgeon: Justice Britain, MD;  Location[facility];  Service: Orthopedics;  Laterality: Right;  158mn   TOTAL SHOULDER ARTHROPLASTY Left 02/23/2021   Procedure: TOTAL SHOULDER ARTHROPLASTY;  Surgeon: SJustice Britain MD;  Location: [facility];  Service: Orthopedics;  Laterality: Left;    Social History:  reports that she has never smoked. She has never used smokeless tobacco. She reports that she does not drink alcohol and does not use drugs. Family History: family history includes Heart attack (age of onset: 667 in her brother; Stroke in her mother.   HOME MEDICATIONS: Allergies as of 12/26/2021       Reactions   Codeine Nausea And Vomiting   Hydrocodone Nausea And Vomiting   Triple Antibiotic Pain Relief [neomy-bacit-polymyx-pramoxine] Other (See Comments)   Burn   Triple Antibiotic W/hydrocortisone  [bacitra-neomycin-polymyxin-hc]    Contrast Media [iodinated Contrast Media] Hives   After CT scan with contrast, pt had MRI without contrast. Pt informed MRI Tech that she had a rash on her legs. MRI Tech spoke with Radiologist after MRI was completed. Per Rad, since patient was here x 30 minutes after CT contrast and was doing fine, with rash fading, pt was allowed to leave facility. Alert and oriented. jmh        Medication List  Accurate as of December 26, 2021  3:15 PM. If you have any questions, ask your nurse or doctor.          AMBULATORY NON FORMULARY MEDICATION Medication Name: Mindful Evening Cocoa Mix (Melatonin 3 mg, Theanine 5 mg, GABA 100 mg)   Black Cohosh 40 MG Caps Take 1 capsule by mouth in the morning and at bedtime.   CALCIUM CITRATE PO Take 600 mg by mouth 2 (two) times daily. Number varies each day based on calcium ingested in food   cholecalciferol 25 MCG (1000 UNIT) tablet Commonly known as: VITAMIN D3 Take 1,000 Units  by mouth daily.   denosumab 60 MG/ML Sosy injection Commonly known as: PROLIA Inject 60 mg into the skin every 6 (six) months.   diphenhydrAMINE 50 MG capsule Commonly known as: BENADRYL Take one capsule 1 hour prior to scan.   Efinaconazole 10 % Soln Apply 1 drop topically daily. What changed:  when to take this reasons to take this   Fish Oil 1000 MG Caps Take 1 capsule (1,000 mg total) by mouth daily.   multivitamin with minerals Tabs tablet Take 1 tablet by mouth daily.   predniSONE 50 MG tablet Commonly known as: DELTASONE Take one tablet 13 hours, 7 hours, and 1 hour prior to scan.   PROBIOTIC PO Take 1 capsule by mouth daily.   Red Yeast Rice 600 MG Caps Take 600 mg by mouth 2 (two) times daily.   tretinoin 0.05 % gel Commonly known as: ALTRALIN Apply 1 application topically at bedtime.   Tri-Luma 0.01-4-0.05 % Crea Generic drug: Fluocin-Hydroquinone-Tretinoin Apply to affeced areas or dark spots once daily. What changed:  how much to take how to take this when to take this reasons to take this          REVIEW OF SYSTEMS: A comprehensive ROS was conducted with the patient and is negative except as per HPI    OBJECTIVE:  VS: BP 110/72 (BP Location: Left Arm, Patient Position: Sitting, Cuff Size: Small)   Pulse 61   Ht '5\' 10"'$  (1.778 m)   Wt 146 lb (66.2 kg)   LMP 08/14/2012   SpO2 98%   BMI 20.95 kg/m    Wt Readings from Last 3 Encounters:  12/26/21 146 lb (66.2 kg)  12/25/21 145 lb (65.8 kg)  12/15/21 145 lb 12.8 oz (66.1 kg)     EXAM: General: Pt appears well and is in NAD  Neck: General: Supple without adenopathy. Thyroid: Thyroid size normal.  No goiter or nodules appreciated.   Lungs: Clear with good BS bilat with no rales, rhonchi, or wheezes  Heart: Auscultation: RRR.  Abdomen: Normoactive bowel sounds, soft, nontender, without masses or organomegaly palpable  Extremities:  BL LE: No pretibial edema normal ROM and strength.   Skin: Hair: Texture and amount normal with gender appropriate distribution Skin Inspection: No rashes Skin Palpation: Skin temperature, texture, and thickness normal to palpation  Mental Status: Judgment, insight: Intact Orientation: Oriented to time, place, and person Mood and affect: No depression, anxiety, or agitation     DATA REVIEWED:    Latest Reference Range & Units 12/13/21 09:28  Sodium 135 - 145 mEq/L 135  Potassium 3.5 - 5.1 mEq/L 4.3  Chloride 96 - 112 mEq/L 103  CO2 19 - 32 mEq/L 26  Glucose 70 - 99 mg/dL 107 (H)  BUN 6 - 23 mg/dL 22  Creatinine 0.40 - 1.20 mg/dL 0.54  Calcium 8.4 - 10.5 mg/dL 9.7  GFR >  60.00 mL/min 100.14     Latest Reference Range & Units 12/13/21 09:28  VITD 30.00 - 100.00 ng/mL 32.60    Latest Reference Range & Units 12/13/21 09:28  TSH 0.35 - 5.50 uIU/mL 1.55  T4,Free(Direct) 0.60 - 1.60 ng/dL 0.87    Latest Reference Range & Units 10/31/20 07:59 05/18/21 11:30 12/13/21 09:28  Calcium, 24H Urine mg/24 h 313 (H) 336 (H) 334 (H)  Creatinine, 24H Ur 0.50 - 2.15 g/24 h 0.93 0.73 1.18    DXA 10/05/2020 The BMD measured at AP Spine L1-L4 is 0.825 g/cm2 with a T-score of -3.0. This patient is considered osteoporotic according to [facility]) criteria.   The quality of the exam is good.   Site Region Measured Date Measured Age YA BMD Significant CHANGE T-score AP Spine L1-L4 10/05/2020 59.1 -3.0 0.825 g/cm2 * AP Spine  L1-L4      08/11/2019    57.9         -2.5    0.874 g/cm2   DualFemur Neck Left 10/05/2020 59.1 -2.3 0.716 g/cm2 * DualFemur Neck Left  08/11/2019    57.9         -1.9    0.772 g/cm2   DualFemur Total Mean 10/05/2020 59.1 -2.0 0.754 g/cm2 * DualFemur Total Mean 08/11/2019    57.9         -1.7    0.787 g/cm2   ASSESSMENT/PLAN/RECOMMENDATIONS:   Osteoporosis:  -Multifactorial given family history of osteoporosis, as well as hypercalciuria -She was started on  Prolia October 2022 without any side  effects - Of note the pt has hx of peptic ulcer and enteritis and is not a candidate for oral bisphosphonate therapy -She continues to consume optimal amount of calcium through her food -We had reduced vitamin D and an attempt to see if this will affect hypercalciuria but it did not, since vitamin D levels are borderline low, we will increase vitamin D as below   Medications : Continue calcium 1000-1200 daily through food Increase  vitamin D 2000 IU daily Continue Prolia 60 mg [place] every 6 months   2.  Hypercalciuria:  -This is one of the contributing factors to osteoporosis -She is on a low salt diet, we have attempted to reduce vitamin D to see if this will have any impact but it did not -We discussed thiazide diuretic, I have recommended chlorthalidone due to long mechanism of action, but she is skeptical regarding side effects as she already has dizziness and polydipsia -We opted to not intervene at this time and just continue with Prolia -We will repeat bone density by her next visit with me   Follow-up in 1 yr    Signed electronically by: Mack Guise, MD  [facility] Endocrinology  [facility] [address] [place], [place] [address] Phone: [phone] FAX: [phone]   CC: Sandrea Hughs, NP [place] [address] Phone: [phone] Fax: [phone]   Return to Endocrinology clinic as below: Future Appointments  Date Time Provider [place]  02/22/2022  1:00 PM Edgardo Roys, PsyD [facility] CPR  02/28/2022  4:15 PM Mcarthur Rossetti, MD [facility] None  03/02/2022  4:00 PM MC-CT 4 MC-CT [facility]  03/09/2022  3:00 PM MC-CV [facility] ECHO 4 [facility]ECHO [facility]  03/16/2022  3:30 PM Lyndal Pulley, DO [facility] None  05/09/2022  8:40 AM [name], Nelda Bucks, NP [facility] None

## 2021-12-26 ENCOUNTER — Ambulatory Visit (INDEPENDENT_AMBULATORY_CARE_PROVIDER_SITE_OTHER): Payer: 59 | Admitting: Internal Medicine

## 2021-12-26 ENCOUNTER — Ambulatory Visit: Payer: 59

## 2021-12-26 ENCOUNTER — Encounter: Payer: Self-pay | Admitting: Internal Medicine

## 2021-12-26 ENCOUNTER — Encounter: Payer: Self-pay | Admitting: Family Medicine

## 2021-12-26 VITALS — BP 110/72 | HR 61 | Ht 70.0 in | Wt 146.0 lb

## 2021-12-26 DIAGNOSIS — R82994 Hypercalciuria: Secondary | ICD-10-CM

## 2021-12-26 DIAGNOSIS — M810 Age-related osteoporosis without current pathological fracture: Secondary | ICD-10-CM | POA: Diagnosis not present

## 2021-12-26 MED ORDER — DENOSUMAB 60 MG/ML ~~LOC~~ SOSY
60.0000 mg | PREFILLED_SYRINGE | Freq: Once | SUBCUTANEOUS | Status: AC
  Administered 2021-12-26: 60 mg via SUBCUTANEOUS

## 2021-12-26 NOTE — Assessment & Plan Note (Signed)
Known degenerative disc disease with osteoporotic changes.  The does respond well to osteopathic manipulation.  We discussed other treatment options.  Patient's lab is somewhat concerning with the elevation of CEA.  We will repeat within our lab system to see if we get another positive and then will refer accordingly.  Patient was having more weight gain so we will get a CA125 as well.  We will get an sedimentation rate to rule out any other inflammatory markers at the moment.  Discussed with patient to continue to stay active.  Awaiting neurocognitive deficit testing in January.  Still think it could be beneficial.  Follow-up with me again in 2 months

## 2021-12-26 NOTE — Patient Instructions (Signed)
Continue Calcium intake 1000-1200 mg daily  May increase Vitamin D 2000 iu daily

## 2021-12-26 NOTE — Progress Notes (Signed)
After obtaining consent, and per orders of Dr. Shamleffer, injection of Prolia 60 mg  given by Cloris Flippo L Mcdonald Reiling in left arm SQ. Patient instructed to remain in clinic for 20 minutes afterwards, and to report any adverse reaction to me immediately.  

## 2021-12-27 DIAGNOSIS — M542 Cervicalgia: Secondary | ICD-10-CM | POA: Diagnosis not present

## 2021-12-27 DIAGNOSIS — M25512 Pain in left shoulder: Secondary | ICD-10-CM | POA: Diagnosis not present

## 2021-12-28 ENCOUNTER — Other Ambulatory Visit (HOSPITAL_COMMUNITY): Payer: 59

## 2022-01-06 LAB — PTH-RELATED PEPTIDE: PTH-Related Protein (PTH-RP): 12 pg/mL (ref 11–20)

## 2022-01-06 LAB — CEA: CEA: 4.4 ng/mL — ABNORMAL HIGH

## 2022-01-06 LAB — PTH, INTACT AND CALCIUM
Calcium: 9.3 mg/dL (ref 8.6–10.4)
PTH: 28 pg/mL (ref 16–77)

## 2022-01-06 LAB — CA 125: CA 125: 16 U/mL (ref <35)

## 2022-01-08 DIAGNOSIS — I83892 Varicose veins of left lower extremities with other complications: Secondary | ICD-10-CM | POA: Diagnosis not present

## 2022-01-09 ENCOUNTER — Encounter: Payer: Self-pay | Admitting: Family Medicine

## 2022-01-10 ENCOUNTER — Other Ambulatory Visit: Payer: Self-pay

## 2022-01-10 DIAGNOSIS — D509 Iron deficiency anemia, unspecified: Secondary | ICD-10-CM

## 2022-01-10 DIAGNOSIS — R970 Elevated carcinoembryonic antigen [CEA]: Secondary | ICD-10-CM

## 2022-01-12 ENCOUNTER — Other Ambulatory Visit (HOSPITAL_COMMUNITY): Payer: 59

## 2022-01-12 NOTE — Telephone Encounter (Signed)
Prior Authorization initiated for PROLIA via [email or web address] Key: [id number] - PA Case ID: [id number]

## 2022-01-15 ENCOUNTER — Ambulatory Visit: Payer: 59 | Admitting: Orthopaedic Surgery

## 2022-01-16 ENCOUNTER — Other Ambulatory Visit: Payer: Self-pay

## 2022-01-16 DIAGNOSIS — D509 Iron deficiency anemia, unspecified: Secondary | ICD-10-CM

## 2022-01-17 ENCOUNTER — Ambulatory Visit: Payer: 59 | Admitting: Orthopaedic Surgery

## 2022-01-17 DIAGNOSIS — M542 Cervicalgia: Secondary | ICD-10-CM | POA: Diagnosis not present

## 2022-01-17 DIAGNOSIS — M25512 Pain in left shoulder: Secondary | ICD-10-CM | POA: Diagnosis not present

## 2022-01-19 DIAGNOSIS — Z96612 Presence of left artificial shoulder joint: Secondary | ICD-10-CM | POA: Diagnosis not present

## 2022-01-20 NOTE — Telephone Encounter (Signed)
Key: [id number] - PA Case ID: [id number] Valid 01/12/22-01/12/23, 2 fills    The request has been approved. The authorization is effective for a maximum of 2 fills from 01/12/2022 to 01/12/2023, as long as the member is enrolled in their current health plan. The request was approved with a quantity restriction. This has been approved for a quantity limit of 1. A written notification letter will follow with additional details.

## 2022-01-20 NOTE — Telephone Encounter (Signed)
Last Prolia inj 12/26/21 Next Prolia inj due 06/27/22

## 2022-01-31 DIAGNOSIS — M25512 Pain in left shoulder: Secondary | ICD-10-CM | POA: Diagnosis not present

## 2022-01-31 DIAGNOSIS — M542 Cervicalgia: Secondary | ICD-10-CM | POA: Diagnosis not present

## 2022-02-01 DIAGNOSIS — M542 Cervicalgia: Secondary | ICD-10-CM | POA: Diagnosis not present

## 2022-02-01 DIAGNOSIS — M25512 Pain in left shoulder: Secondary | ICD-10-CM | POA: Diagnosis not present

## 2022-02-02 DIAGNOSIS — M25512 Pain in left shoulder: Secondary | ICD-10-CM | POA: Diagnosis not present

## 2022-02-20 ENCOUNTER — Encounter: Payer: Self-pay | Admitting: Gastroenterology

## 2022-02-22 ENCOUNTER — Ambulatory Visit: Payer: 59 | Admitting: Psychology

## 2022-02-28 ENCOUNTER — Ambulatory Visit (INDEPENDENT_AMBULATORY_CARE_PROVIDER_SITE_OTHER): Payer: 59 | Admitting: Orthopaedic Surgery

## 2022-02-28 ENCOUNTER — Encounter: Payer: Self-pay | Admitting: Orthopaedic Surgery

## 2022-02-28 DIAGNOSIS — M25552 Pain in left hip: Secondary | ICD-10-CM | POA: Diagnosis not present

## 2022-02-28 DIAGNOSIS — R252 Cramp and spasm: Secondary | ICD-10-CM | POA: Diagnosis not present

## 2022-02-28 DIAGNOSIS — R600 Localized edema: Secondary | ICD-10-CM | POA: Diagnosis not present

## 2022-02-28 DIAGNOSIS — I8312 Varicose veins of left lower extremity with inflammation: Secondary | ICD-10-CM | POA: Diagnosis not present

## 2022-02-28 DIAGNOSIS — I8311 Varicose veins of right lower extremity with inflammation: Secondary | ICD-10-CM | POA: Diagnosis not present

## 2022-02-28 DIAGNOSIS — I87393 Chronic venous hypertension (idiopathic) with other complications of bilateral lower extremity: Secondary | ICD-10-CM | POA: Diagnosis not present

## 2022-02-28 NOTE — Progress Notes (Signed)
The patient is a 62 year old female that I have actually seen before.  She is also followed by Dr. Gardenia Phlegm from a sports medicine standpoint.  When I originally saw her it was for bilateral hip pain with the left worse than right and a MRI of her pelvis did not show any significant abnormalities within the hip joints themselves.  She was having some tendinosis over the hamstring area more on the left than the right and trochanteric areas.  Since I saw her last she has had both of her shoulders replaced.  She has a reverse shoulder arthroplasty on the right side and an anatomic shoulder on the left side.  We originally had recommended physical therapy with Barbaraann Barthel for her hips but her therapy time is been focused on recovering from shoulder replacement surgery.  She still has some of the same complaints of bilateral hip discomfort around her IT band and glutes on both sides.  She feels sometimes a popping sensation in the IT band when she is performing Pilates.  On exam both hips move smoothly and fluidly.  There is pain over the trochanteric area and the proximal IT band but no blocks to rotation and no significant gross pathology.  I did give her a new prescription for physical therapy with [facility].  At this point we will have the concentration be directed toward her hips including her trochanteric area and hamstring areas.  I will then see her back in about 2 months to see if she has had any success with improving her function as well as decreasing her pain and improving her flexibility.  She agrees with this treatment plan.

## 2022-03-01 ENCOUNTER — Telehealth (HOSPITAL_COMMUNITY): Payer: Self-pay | Admitting: *Deleted

## 2022-03-01 NOTE — Telephone Encounter (Signed)
Reaching out to patient to offer assistance regarding upcoming cardiac imaging study; pt verbalizes understanding of appt date/time, parking situation and where to check in, pre-test NPO status and medications ordered, and verified current allergies; name and call back number provided for further questions should they arise  Gordy Clement RN [place] [facility] [phone] office [phone] cell  Reviewed how to take 13 hour prep with patient and she verbalized understanding. She is aware to arrive at 3:30pm.

## 2022-03-02 ENCOUNTER — Ambulatory Visit (HOSPITAL_COMMUNITY)
Admission: RE | Admit: 2022-03-02 | Discharge: 2022-03-02 | Disposition: A | Discharge status: Home / Self Care | Payer: 59 | Source: Ambulatory Visit | Attending: Cardiovascular Disease | Admitting: Cardiovascular Disease

## 2022-03-02 DIAGNOSIS — R0602 Shortness of breath: Secondary | ICD-10-CM | POA: Diagnosis not present

## 2022-03-02 DIAGNOSIS — I2510 Atherosclerotic heart disease of native coronary artery without angina pectoris: Secondary | ICD-10-CM

## 2022-03-02 MED ORDER — IOHEXOL 350 MG/ML SOLN
95.0000 mL | Freq: Once | INTRAVENOUS | Status: AC | PRN
  Administered 2022-03-02: 95 mL via INTRAVENOUS

## 2022-03-02 MED ORDER — NITROGLYCERIN 0.4 MG SL SUBL: 0.8000 mg | SUBLINGUAL_TABLET | SUBLINGUAL | Status: DC | PRN

## 2022-03-02 MED ORDER — NITROGLYCERIN 0.4 MG SL SUBL
SUBLINGUAL_TABLET | SUBLINGUAL | Status: AC
  Administered 2022-03-02: 0.8 mg via SUBLINGUAL
  Filled 2022-03-02: qty 2

## 2022-03-09 ENCOUNTER — Ambulatory Visit (HOSPITAL_COMMUNITY): Payer: 59 | Attending: Cardiovascular Disease

## 2022-03-09 DIAGNOSIS — T84099D Other mechanical complication of unspecified internal joint prosthesis, subsequent encounter: Secondary | ICD-10-CM | POA: Diagnosis not present

## 2022-03-09 DIAGNOSIS — R0602 Shortness of breath: Secondary | ICD-10-CM | POA: Insufficient documentation

## 2022-03-09 DIAGNOSIS — I340 Nonrheumatic mitral (valve) insufficiency: Secondary | ICD-10-CM | POA: Insufficient documentation

## 2022-03-09 DIAGNOSIS — Z96612 Presence of left artificial shoulder joint: Secondary | ICD-10-CM | POA: Diagnosis not present

## 2022-03-09 LAB — ECHOCARDIOGRAM COMPLETE
Area-P 1/2: 3.17 cm2
MV M vel: 4.5 m/s
MV Peak grad: 80.9 mmHg
S' Lateral: 2.9 cm
Single Plane A4C EF: 57.4 %

## 2022-03-14 ENCOUNTER — Other Ambulatory Visit (HOSPITAL_COMMUNITY): Payer: Self-pay

## 2022-03-14 ENCOUNTER — Telehealth: Payer: Self-pay | Admitting: Cardiovascular Disease

## 2022-03-14 DIAGNOSIS — E785 Hyperlipidemia, unspecified: Secondary | ICD-10-CM

## 2022-03-14 MED ORDER — ROSUVASTATIN CALCIUM 5 MG PO TABS
5.0000 mg | ORAL_TABLET | Freq: Every day | ORAL | 3 refills | Status: DC
  Filled 2022-03-14 (×2): qty 90, 90d supply, fill #0
  Filled 2022-06-09: qty 90, 90d supply, fill #1
  Filled 2022-09-06: qty 90, 90d supply, fill #2
  Filled 2022-09-22 – 2022-12-05 (×2): qty 90, 90d supply, fill #3

## 2022-03-14 MED ORDER — ASPIRIN 81 MG PO TBEC: 81.0000 mg | DELAYED_RELEASE_TABLET | Freq: Every day | ORAL | 3 refills | Status: AC

## 2022-03-14 NOTE — Telephone Encounter (Signed)
Reviewed results with the patient.  She is in agreement to start aspirin 81 mg daily and rosuvastatin 5 mg daily.  She eats a healthy mostly plant based diet and exercises regularly with weights and running.  Also reviewed echo results.  The patient has been scheduled back with Dr. Angelena [name] to review results.

## 2022-03-14 NOTE — Telephone Encounter (Signed)
-----  Message from [name], MD sent at 03/03/2022  6:05 AM EST ----- Calcium score of zero. She has very mild, early plaque in her LAD and RCA. Based on this, I would recommend a daily ASA 81 mg and Crestor 5 mg daily. If she agrees to start the statin, she would need lipids and LFTs in 12 weeks. Tara [name]

## 2022-03-14 NOTE — Telephone Encounter (Signed)
Patient returned call for CT results. Transferred call to RN.

## 2022-03-15 ENCOUNTER — Other Ambulatory Visit: Payer: Self-pay

## 2022-03-15 ENCOUNTER — Ambulatory Visit: Payer: Commercial Managed Care - PPO | Admitting: Gastroenterology

## 2022-03-15 ENCOUNTER — Other Ambulatory Visit (HOSPITAL_COMMUNITY): Payer: Self-pay

## 2022-03-16 ENCOUNTER — Ambulatory Visit: Payer: 59 | Admitting: Family Medicine

## 2022-03-29 ENCOUNTER — Other Ambulatory Visit (HOSPITAL_COMMUNITY): Payer: Self-pay

## 2022-04-02 ENCOUNTER — Encounter: Payer: Self-pay | Admitting: Cardiovascular Disease

## 2022-04-02 ENCOUNTER — Ambulatory Visit: Payer: 59 | Attending: Cardiovascular Disease | Admitting: Cardiovascular Disease

## 2022-04-02 VITALS — BP 108/80 | HR 60 | Ht 70.0 in | Wt 148.2 lb

## 2022-04-02 DIAGNOSIS — I2510 Atherosclerotic heart disease of native coronary artery without angina pectoris: Secondary | ICD-10-CM | POA: Diagnosis not present

## 2022-04-02 DIAGNOSIS — R0602 Shortness of breath: Secondary | ICD-10-CM | POA: Diagnosis not present

## 2022-04-02 DIAGNOSIS — I340 Nonrheumatic mitral (valve) insufficiency: Secondary | ICD-10-CM

## 2022-04-02 NOTE — Progress Notes (Signed)
Chief Complaint  Patient presents with   Follow-up    Dyspnea, mitral valve regurgitation   History of Present Illness: 62 yo female with history of anemia, arthritis, hyperlipidemia and mitral valve prolapse/mitral regurgitation who is here today for cardiac follow up. I saw her as a new patient for the evaluation of her mitral valve disease in October 2023. Echo in 2012 with normal LV function and mild to moderate mitral regurgitation. She was lost to follow up in our office from 2012 to 2023. When I saw her in October 2023 she described severe dyspnea on exertion. No chest pain. She had edema in both ankles at the end of the day. Echo January 2024 with LVEF=60-65%, normal RV function. Moderate mitral regurgitation but unable to fully estimate degree of severity of the MR. Coronary CTA 03/02/22 with minimal plaque in the LAD and RCA, calcium score of zero. She was started on a statin and ASA.   She is here today for follow up. She continues to have dyspnea on exertion. She denies any chest pain, palpitations, lower extremity edema, orthopnea, PND, dizziness, near syncope or syncope.   Primary Care Physician: Sandrea Hughs, NP   Past Medical History:  Diagnosis Date   Anemia    Arthritis    Hyperlipidemia    Insomnia    Mitral valve prolapse    PONV (postoperative nausea and vomiting)     Past Surgical History:  Procedure Laterality Date   AUGMENTATION MAMMAPLASTY Bilateral    Dec 2019   BREAST BIOPSY Right    axillary node benign   KNEE SURGERY  1988   right   SHOULDER ARTHROSCOPY WITH LABRAL REPAIR Right 08/28/2012   Procedure: RIGHT SHOULDER ARTHROSCOPY WITH LABRAL DEBRIDEMENT, ASPIRATION AND DECOMPRESSION OF PARALABRAL CYST;  Surgeon: Marin Shutter, MD;  Location: [facility];  Service: Orthopedics;  Laterality: Right;   TOTAL SHOULDER ARTHROPLASTY Right 03/24/2020   Procedure: RIGHT REVERSE SHOULDER ARTHROPLASTY;  Surgeon: Justice Britain, MD;  [facility];  Service:  Orthopedics;  Laterality: Right;  167mn   TOTAL SHOULDER ARTHROPLASTY Left 02/23/2021   Procedure: TOTAL SHOULDER ARTHROPLASTY;  Surgeon: SJustice Britain MD;  [facility];  Service: Orthopedics;  Laterality: Left;    Current Outpatient Medications  Medication Sig Dispense Refill   AMBULATORY NON FORMULARY MEDICATION Medication Name: Mindful Evening Cocoa Mix (Melatonin 3 mg, Theanine 5 mg, GABA 100 mg)     aspirin EC 81 MG tablet Take 1 tablet (81 mg total) by mouth daily. Swallow whole. 90 tablet 3   Black Cohosh 40 MG CAPS Take 1 capsule by mouth in the morning and at bedtime.     CALCIUM CITRATE PO Take 600 mg by mouth 2 (two) times daily. Number varies each day based on calcium ingested in food     cholecalciferol (VITAMIN D3) 25 MCG (1000 UNIT) tablet Take 1,000 Units by mouth daily.     denosumab (PROLIA) 60 MG/ML SOSY injection Inject 60 mg into the skin every 6 (six) months.     Efinaconazole 10 % SOLN Apply 1 drop topically daily. (Patient taking differently: Apply 1 drop topically daily as needed (Toe Nail).) 4 mL 11   Fluocin-Hydroquinone-Tretinoin 0.01-4-0.05 % CREA Apply to affeced areas or dark spots once daily. (Patient taking differently: Apply 1 application  topically daily as needed (face).) 30 g 3   Multiple Vitamin (MULTIVITAMIN WITH MINERALS) TABS Take 1 tablet by mouth daily.     Omega-3 Fatty Acids (FISH OIL)  1000 MG CAPS Take 1 capsule (1,000 mg total) by mouth daily. 30 capsule 0   rosuvastatin (CRESTOR) 5 MG tablet Take 1 tablet (5 mg total) by mouth daily. 90 tablet 3   tretinoin (ALTRALIN) 0.05 % gel Apply 1 application topically at bedtime. 45 g 3   No current facility-administered medications for this visit.    Allergies  Allergen Reactions   Codeine Nausea And Vomiting   Hydrocodone Nausea And Vomiting   Triple Antibiotic Pain Relief [Neomy-Bacit-Polymyx-Pramoxine] Other (See Comments)    Burn   Triple Antibiotic W/Hydrocortisone   [Bacitra-Neomycin-Polymyxin-Hc]    Contrast Media [Iodinated Contrast Media] Hives    After CT scan with contrast, pt had MRI without contrast. Pt informed MRI Tech that she had a rash on her legs. MRI Tech spoke with Radiologist after MRI was completed. Per Rad, since patient was here x 30 minutes after CT contrast and was doing fine, with rash fading, pt was allowed to leave facility. Alert and oriented. jmh    Social History   Socioeconomic History   Marital status: Married    Spouse name: Not on file   Number of children: 1   Years of education: Not on file   Highest education level: Not on file  Occupational History   Not on file  Tobacco Use   Smoking status: Never   Smokeless tobacco: Never  Vaping Use   Vaping Use: Never used  Substance and Sexual Activity   Alcohol use: No   Drug use: No   Sexual activity: Not on file  Other Topics Concern   Not on file  Social History Narrative   Tobacco use, amount per day now: 0   Past tobacco use, amount per day: 0   How many years did you use tobacco: 0   Alcohol use (drinks per week): 0   Diet: Heart Healthy/DASH Diet/ Lean and Clean type foods.   Do you drink/eat things with caffeine: 1 glass of Tea in the morning.   Marital status: Married                                 What year were you married? 1987   Do you live in a house, apartment, assisted living, condo, trailer, etc.? House   Is it one or more stories? Yes   How many persons live in your home? 2 ( Husband/ Self )   Do you have pets in your home?( please list) Not currently, Dog Deceased.    Current or past profession: PhD/RN   Do you exercise?    Yes                              Type and how often? 6 Days a week.   Do you have a living will? Yes   Do you have a DNR form?   No                               If not, do you want to discuss one? Yes   Do you have signed POA/HPOA forms? Yes                       If so, please bring to you appointment   Social  Determinants of Health   Financial  Resource Strain: Not on file  Food Insecurity: Not on file  Transportation Needs: Not on file  Physical Activity: Not on file  Stress: Not on file  Social Connections: Not on file  Intimate Partner Violence: Not on file    Family History  Problem Relation Age of Onset   Stroke Mother    Heart attack Brother 56   Sudden death Neg Hx    Hypertension Neg Hx    Hyperlipidemia Neg Hx    Diabetes Neg Hx    Breast cancer Neg Hx     Review of Systems:  As stated in the HPI and otherwise negative.   BP 108/80   Pulse 60   Ht 5' 10"$  (1.778 m)   Wt 67.2 kg   LMP 08/14/2012   SpO2 99%   BMI 21.26 kg/m   Physical Examination:  General: Well developed, well nourished, NAD  HEENT: OP clear, mucus membranes moist  SKIN: warm, dry. No rashes. Neuro: No focal deficits  Musculoskeletal: Muscle strength 5/5 all ext  Psychiatric: Mood and affect normal  Neck: No JVD, no carotid bruits, no thyromegaly, no lymphadenopathy.  Lungs:Clear bilaterally, no wheezes, rhonci, crackles Cardiovascular: Regular rate and rhythm. Systolic murmur.  Abdomen:Soft. Bowel sounds present. Non-tender.  Extremities: No lower extremity edema. Pulses are 2 + in the bilateral DP/PT.  EKG:  EKG is not ordered today. The ekg ordered today demonstrates  Echo 03/09/22:  1. Left ventricular ejection fraction, by estimation, is 60 to 65%. The  left ventricle has normal function. The left ventricle has no regional  wall motion abnormalities. Left ventricular diastolic parameters were  normal.   2. Right ventricular systolic function is normal. The right ventricular  size is normal.   3. Prolapse of posterior leaflet Degree of MR not well characterized on  color flow but does not appear severe Can consider TEE to further evaluate  if clinically indicated . The mitral valve is myxomatous. Mild to moderate  mitral valve regurgitation. No  evidence of mitral stenosis.   4. The  aortic valve is tricuspid. Aortic valve regurgitation is not  visualized. No aortic stenosis is present.   5. The inferior vena cava is normal in size with greater than 50%  respiratory variability, suggesting right atrial pressure of 3 mmHg.   Recent Labs: 05/04/2021: ALT 22; Hemoglobin 14.5; Platelets 212 12/13/2021: BUN 22; Creatinine, Ser 0.54; Potassium 4.3; Sodium 135; TSH 1.55   Lipid Panel    Component Value Date/Time   CHOL 169 05/04/2021 0939   TRIG 44 05/04/2021 0939   HDL 67 05/04/2021 0939   CHOLHDL 2.5 05/04/2021 0939   LDLCALC 89 05/04/2021 0939     Wt Readings from Last 3 Encounters:  04/02/22 67.2 kg  12/26/21 66.2 kg  12/25/21 65.8 kg    Assessment and Plan:   1. Mitral regurgitation: Moderate to severe MR by surface echo. She has no other reason to have such profound dyspnea. She is thin and active with no known lung disease. We have dicussed arranging a TEE to better define the severity of her MR. She has had recent trouble swallowing/having dry foods get stuck. She is seeing GI in 2 weeks. I will wait until after her GI assessment to plan a TEE.   2. Dyspnea on exertion: Echo as above. Her dyspnea may be related to her MR. Will plan a TEE as above.   3. CAD without angina: Mild CAD on coronary CTA in January 2024. Continue statin. She  will stop ASA as it has caused her to have significant bruising.   Labs/ tests ordered today include:   No orders of the defined types were placed in this encounter.  Disposition:   F/U with me in 4-6 weeks  Signed, Lauree Chandler, MD, [facility] 04/02/2022 3:36 PM    [facility] [address], [place], [place]  [address] Phone: [phone]; Fax: [phone]

## 2022-04-02 NOTE — Patient Instructions (Signed)
Medication Instructions:  No changes *If you need a refill on your cardiac medications before your next appointment, please call your pharmacy*   Lab Work: none If you have labs (blood work) drawn today and your tests are completely normal, you will receive your results only by: [place] (if you have MyChart) OR A paper copy in the mail If you have any lab test that is abnormal or we need to change your treatment, we will call you to review the results.   Testing/Procedures: none   Follow-Up: 6-8 weeks with Dr. Angelena Form.  I'll reach out to you with the appointment as we discussed.

## 2022-04-10 ENCOUNTER — Other Ambulatory Visit (HOSPITAL_COMMUNITY): Payer: Self-pay

## 2022-04-10 DIAGNOSIS — L818 Other specified disorders of pigmentation: Secondary | ICD-10-CM | POA: Diagnosis not present

## 2022-04-10 DIAGNOSIS — L908 Other atrophic disorders of skin: Secondary | ICD-10-CM | POA: Diagnosis not present

## 2022-04-10 DIAGNOSIS — D225 Melanocytic nevi of trunk: Secondary | ICD-10-CM | POA: Diagnosis not present

## 2022-04-10 DIAGNOSIS — Z1283 Encounter for screening for malignant neoplasm of skin: Secondary | ICD-10-CM | POA: Diagnosis not present

## 2022-04-10 DIAGNOSIS — B078 Other viral warts: Secondary | ICD-10-CM | POA: Diagnosis not present

## 2022-04-10 MED ORDER — TRETINOIN 0.025 % EX CREA
1.0000 | TOPICAL_CREAM | Freq: Every day | CUTANEOUS | 11 refills | Status: DC
  Filled 2022-04-10: qty 45, 30d supply, fill #0

## 2022-04-10 MED ORDER — HYDROQUINONE 4 % EX CREA
1.0000 | TOPICAL_CREAM | Freq: Two times a day (BID) | CUTANEOUS | 3 refills | Status: DC
  Filled 2022-04-10: qty 28.35, 25d supply, fill #0
  Filled 2022-10-13: qty 28.35, 25d supply, fill #1
  Filled 2023-03-06: qty 28.35, 25d supply, fill #2

## 2022-04-11 ENCOUNTER — Other Ambulatory Visit (HOSPITAL_COMMUNITY): Payer: Self-pay

## 2022-04-11 ENCOUNTER — Ambulatory Visit (INDEPENDENT_AMBULATORY_CARE_PROVIDER_SITE_OTHER): Payer: 59 | Admitting: Gastroenterology

## 2022-04-11 ENCOUNTER — Encounter: Payer: Self-pay | Admitting: Gastroenterology

## 2022-04-11 VITALS — BP 122/64 | HR 69 | Ht 70.0 in | Wt 145.0 lb

## 2022-04-11 DIAGNOSIS — R933 Abnormal findings on diagnostic imaging of other parts of digestive tract: Secondary | ICD-10-CM | POA: Diagnosis not present

## 2022-04-11 DIAGNOSIS — M810 Age-related osteoporosis without current pathological fracture: Secondary | ICD-10-CM | POA: Diagnosis not present

## 2022-04-11 DIAGNOSIS — R1310 Dysphagia, unspecified: Secondary | ICD-10-CM | POA: Diagnosis not present

## 2022-04-11 DIAGNOSIS — R970 Elevated carcinoembryonic antigen [CEA]: Secondary | ICD-10-CM | POA: Diagnosis not present

## 2022-04-11 DIAGNOSIS — R110 Nausea: Secondary | ICD-10-CM | POA: Diagnosis not present

## 2022-04-11 DIAGNOSIS — Z1211 Encounter for screening for malignant neoplasm of colon: Secondary | ICD-10-CM

## 2022-04-11 MED ORDER — CLENPIQ 10-3.5-12 MG-GM -GM/175ML PO SOLN
1.0000 | Freq: Once | ORAL | 0 refills | Status: AC
  Filled 2022-04-11 – 2022-04-13 (×2): qty 350, 1d supply, fill #0

## 2022-04-11 NOTE — Patient Instructions (Addendum)
We have sent the following medications to your pharmacy for you to pick up at your convenience: Clenpiq  You have been scheduled for an endoscopy and colonoscopy. Please follow the written instructions given to you at your visit today. Please pick up your prep supplies at the pharmacy within the next 1-3 days. If you use inhalers (even only as needed), please bring them with you on the day of your procedure.   _______________________________________________________  If your blood pressure at your visit was 140/90 or greater, please contact your primary care physician to follow up on this.  _______________________________________________________  If you are age 11 or younger, your body mass index should be between 19-25. Your Body mass index is 20.81 kg/m. If this is out of the aformentioned range listed, please consider follow up with your Primary Care Provider.   __________________________________________________________  The [place] [facility] providers would like to encourage you to use [facility] to communicate with providers for non-urgent requests or questions.  Due to long hold times on the telephone, sending your provider a message by [facility] may be a faster and more efficient way to get a response.  Please allow 48 business hours for a response.  Please remember that this is for non-urgent requests.   Due to recent changes in healthcare laws, you may see the results of your imaging and laboratory studies on MyChart before your provider has had a chance to review them.  We understand that in some cases there may be results that are confusing or concerning to you. Not all laboratory results come back in the same time frame and the provider may be waiting for multiple results in order to interpret others.  Please give [place] 48 hours in order for your provider to thoroughly review all the results before contacting the office for clarification of your results.     Thank you for choosing me and [facility].  Vito Cirigliano, D.O.

## 2022-04-11 NOTE — Progress Notes (Signed)
Chief Complaint: Nausea, dysphagia   Referring Provider:     [name], Nelda Bucks, NP   HPI:     Tara [name] is a 62 y.o. female with medical history as below referred to the Gastroenterology Clinic for evaluation of dysphagia, and nausea.  History of osteopenia -> osteoporosis. Started on Prolia by Dr. Tamala Julian. Follows with Endodcrine for this and hypercalciuria.   Has had dysphagia for the last year. Worse with dry foods and rushing through meals. Points to anterior neck.  No history of food impactions.  No typical reflux symptoms such as heartburn, regurgitation, waterbrash.  Possibly some globus sensation at times.  Additionally, has had nausea for the last year as well. No emesis. No abdominal pain. Worse with hot flashes that she attributes to menopausal symptoms. Nausea improves with PO intake, so tends to graze through the day.   Currently undergoing evaluation by Cardiology clinic for moderate to severe mitral regurgitation, with plan for TEE, but TEE on hold until EGD due to her dysphagia.  Had to stop ASA due to bruising.   Reports having anemia years ago. Mother with anemia as well.   Referral is reportedly for anemia and concerns for malabsorption, but last CBC 05/04/2021 with H/H 14.5/43, MCV/RDW 97.7/12.  More recent labs from 12/2021 with normal B12.  CEA was slightly elevated at 4.4.  CA125 normal.  Normal BMP, vitamin D, TSH  Normal colonoscopy in 2014.  No previous EGD.   Past Medical History:  Diagnosis Date   Anemia    Arthritis    Hyperlipidemia    Insomnia    Mitral valve prolapse    PONV (postoperative nausea and vomiting)      Past Surgical History:  Procedure Laterality Date   AUGMENTATION MAMMAPLASTY Bilateral    Dec 2019   BREAST BIOPSY Right    axillary node benign   KNEE SURGERY  1988   right   SHOULDER ARTHROSCOPY WITH LABRAL REPAIR Right 08/28/2012   Procedure: RIGHT SHOULDER ARTHROSCOPY WITH LABRAL DEBRIDEMENT, ASPIRATION  AND DECOMPRESSION OF PARALABRAL CYST;  Surgeon: Marin Shutter, MD;  Location: [place];  Service: Orthopedics;  Laterality: Right;   TOTAL SHOULDER ARTHROPLASTY Right 03/24/2020   Procedure: RIGHT REVERSE SHOULDER ARTHROPLASTY;  Surgeon: Justice Britain, MD;  Location: [facility];  Service: Orthopedics;  Laterality: Right;  156mn   TOTAL SHOULDER ARTHROPLASTY Left 02/23/2021   Procedure: TOTAL SHOULDER ARTHROPLASTY;  Surgeon: SJustice Britain MD;  Location: [facility];  Service: Orthopedics;  Laterality: Left;   Family History  Problem Relation Age of Onset   Stroke Mother    Heart disease Mother    Heart disease Father    Diabetes Sister    Heart disease Sister    Diabetes Sister    Heart attack Brother 68  Diabetes Brother    Heart disease Brother    Sudden death Neg Hx    Hypertension Neg Hx    Hyperlipidemia Neg Hx    Breast cancer Neg Hx    Social History   Tobacco Use   Smoking status: Never   Smokeless tobacco: Never  Vaping Use   Vaping Use: Never used  Substance Use Topics   Alcohol use: No   Drug use: No   Current Outpatient Medications  Medication Sig Dispense Refill   AMBULATORY NON FORMULARY MEDICATION Medication Name: Mindful Evening Cocoa Mix (Melatonin 3 mg, Theanine 5 mg, GABA 100 mg)  aspirin EC 81 MG tablet Take 1 tablet (81 mg total) by mouth daily. Swallow whole. 90 tablet 3   Black Cohosh 40 MG CAPS Take 1 capsule by mouth in the morning and at bedtime.     CALCIUM CITRATE PO Take 600 mg by mouth 2 (two) times daily. Number varies each day based on calcium ingested in food     cholecalciferol (VITAMIN D3) 25 MCG (1000 UNIT) tablet Take 1,000 Units by mouth daily.     denosumab (PROLIA) 60 MG/ML SOSY injection Inject 60 mg into the skin every 6 (six) months.     Efinaconazole 10 % SOLN Apply 1 drop topically daily. (Patient taking differently: Apply 1 drop topically daily as needed (Toe Nail).) 4 mL 11   hydroquinone 4 % cream Apply 1 Application topically to  affected areas 2 (two) times daily. 56.7 g 3   Multiple Vitamin (MULTIVITAMIN WITH MINERALS) TABS Take 1 tablet by mouth daily.     Omega-3 Fatty Acids (FISH OIL) 1000 MG CAPS Take 1 capsule (1,000 mg total) by mouth daily. 30 capsule 0   rosuvastatin (CRESTOR) 5 MG tablet Take 1 tablet (5 mg total) by mouth daily. 90 tablet 3   tretinoin (RETIN-A) 0.025 % cream Apply 1 Application to affected areas topically at bedtime. 45 g 11   Fluocin-Hydroquinone-Tretinoin 0.01-4-0.05 % CREA Apply to affeced areas or dark spots once daily. (Patient not taking: Reported on 04/11/2022) 30 g 3   tretinoin (ALTRALIN) 0.05 % gel Apply 1 application topically at bedtime. (Patient not taking: Reported on 04/11/2022) 45 g 3   No current facility-administered medications for this visit.   Allergies  Allergen Reactions   Codeine Nausea And Vomiting   Hydrocodone Nausea And Vomiting   Triple Antibiotic Pain Relief [Neomy-Bacit-Polymyx-Pramoxine] Other (See Comments)    Burn   Triple Antibiotic W/Hydrocortisone  [Bacitra-Neomycin-Polymyxin-Hc]    Contrast Media [Iodinated Contrast Media] Hives    After CT scan with contrast, pt had MRI without contrast. Pt informed MRI Tech that she had a rash on her legs. MRI Tech spoke with Radiologist after MRI was completed. Per Rad, since patient was here x 30 minutes after CT contrast and was doing fine, with rash fading, pt was allowed to leave facility. Alert and oriented. jmh     Review of Systems: All systems reviewed and negative except where noted in HPI.     Physical Exam:    Wt Readings from Last 3 Encounters:  04/11/22 145 lb (65.8 kg)  04/02/22 148 lb 3.2 oz (67.2 kg)  12/26/21 146 lb (66.2 kg)    BP 122/64   Pulse 69   Ht 5' 10"$  (1.778 m)   Wt 145 lb (65.8 kg)   LMP 08/14/2012   BMI 20.81 kg/m  Constitutional:  Pleasant, in no acute distress. Psychiatric: Normal mood and affect. Behavior is normal. EENT: Pupils normal.  Conjunctivae are normal. No  scleral icterus. Neck supple. No cervical LAD. Cardiovascular: Normal rate, regular rhythm. No edema Pulmonary/chest: Effort normal and breath sounds normal. No wheezing, rales or rhonchi. Abdominal: Soft, nondistended, nontender. Bowel sounds active throughout. There are no masses palpable. No hepatomegaly. Neurological: Alert and oriented to person place and time. Skin: Skin is warm and dry. No rashes noted.   ASSESSMENT AND PLAN;   1) Dysphagia - EGD to evaluate for mucosal/luminal pathology with plan for esophageal dilation and/or biopsies as appropriate - Recommended she start eating with her left hand as an intentional way of slowing  her down during mealtimes - Cut food into smaller pieces, chew thoroughly, take time with meals, drink plenty of fluids with meals - Holding off on introducing new medications until completion of EGD/colonoscopy  2) Nausea 3) Osteoporosis She reports that there is some concern for possible malabsorption.  Will evaluate for mucosal pathology at time of upper endoscopy with plan for random and directed gastric/duodenal biopsies  4) Colon cancer screening - Due for colonoscopy for ongoing screening - Schedule colonoscopy  5) Elevated CEA - Planning colonoscopy as above - If evaluation unrevealing, will consider repeat cross-sectional imaging and/or referral to Hematology  6) Abnormal imaging CT in 08/2020 with inflammatory changes around the descending duodenum.  Will evaluate for PUD at time of upper endoscopy as well   The indications, risks, and benefits of EGD and colonoscopy were explained to the patient in detail. Risks include but are not limited to bleeding, perforation, adverse reaction to medications, and cardiopulmonary compromise. Sequelae include but are not limited to the possibility of surgery, hospitalization, and mortality. The patient verbalized understanding and wished to proceed. All questions answered, referred to scheduler and  bowel prep ordered. Further recommendations pending results of the exam.      Lavena Bullion, DO, FACG  04/11/2022, 11:38 AM   [name], Nelda Bucks, NP

## 2022-04-12 ENCOUNTER — Other Ambulatory Visit (HOSPITAL_COMMUNITY): Payer: Self-pay

## 2022-04-12 ENCOUNTER — Encounter: Payer: Self-pay | Admitting: Orthopaedic Surgery

## 2022-04-12 ENCOUNTER — Encounter: Payer: Self-pay | Admitting: Cardiovascular Disease

## 2022-04-12 NOTE — Telephone Encounter (Signed)
Error

## 2022-04-13 ENCOUNTER — Other Ambulatory Visit (HOSPITAL_COMMUNITY): Payer: Self-pay

## 2022-04-13 ENCOUNTER — Other Ambulatory Visit: Payer: Self-pay

## 2022-04-21 ENCOUNTER — Other Ambulatory Visit (HOSPITAL_COMMUNITY): Payer: Self-pay

## 2022-04-23 ENCOUNTER — Other Ambulatory Visit (HOSPITAL_COMMUNITY): Payer: Self-pay

## 2022-04-27 ENCOUNTER — Ambulatory Visit: Payer: 59 | Admitting: Family Medicine

## 2022-05-07 ENCOUNTER — Encounter: Payer: Self-pay | Admitting: Family

## 2022-05-07 ENCOUNTER — Ambulatory Visit: Payer: 59 | Admitting: Family

## 2022-05-07 VITALS — BP 108/72 | HR 52 | Temp 97.6°F | Resp 16 | Ht 70.28 in | Wt 143.6 lb

## 2022-05-07 DIAGNOSIS — I8311 Varicose veins of right lower extremity with inflammation: Secondary | ICD-10-CM

## 2022-05-07 DIAGNOSIS — Z0000 Encounter for general adult medical examination without abnormal findings: Secondary | ICD-10-CM | POA: Diagnosis not present

## 2022-05-07 DIAGNOSIS — I8312 Varicose veins of left lower extremity with inflammation: Secondary | ICD-10-CM

## 2022-05-07 DIAGNOSIS — I2510 Atherosclerotic heart disease of native coronary artery without angina pectoris: Secondary | ICD-10-CM | POA: Diagnosis not present

## 2022-05-07 DIAGNOSIS — Z124 Encounter for screening for malignant neoplasm of cervix: Secondary | ICD-10-CM | POA: Diagnosis not present

## 2022-05-07 DIAGNOSIS — R7989 Other specified abnormal findings of blood chemistry: Secondary | ICD-10-CM

## 2022-05-07 DIAGNOSIS — M159 Polyosteoarthritis, unspecified: Secondary | ICD-10-CM

## 2022-05-07 LAB — HM PAP SMEAR: HM Pap smear: NORMAL

## 2022-05-07 NOTE — Progress Notes (Signed)
Provider: Marlowe Sax FNP-C   Trevyn Lumpkin, Nelda Bucks, NP  Patient Care Team: Mihir Flanigan, Nelda Bucks, NP as PCP - General (Family Medicine) [name], MD as PCP - Cardiology (Cardiology) Kennith Center, RD as Dietitian (Family Medicine) Katy Apo, MD as Consulting Physician (Ophthalmology) Adornetto, Lucianne Lei, DMD (Dentistry) Kennith Center, RD as Dietitian [facility])  Extended Emergency Contact Information Primary Emergency Contact: [facility] Address: [address] [place], [place] [address] Johnnette Litter of [place] Phone: [phone] Work Phone: [phone] Mobile Phone: [phone] Relation: Spouse  Code Status:  Full Code  Goals of care: Advanced Directive information    05/03/2021    4:59 PM  Advanced Directives  Does Patient Have a Medical Advance Directive? Yes  Type of Paramedic of [place];Living will  Does patient want to make changes to medical advance directive? No - Patient declined  Copy of [place] in Chart? Yes - validated most recent copy scanned in chart (See row information)     Chief Complaint  Patient presents with   Annual Exam    HPI:  Pt is a 62 y.o. female seen today for annual physical examination and medical management of chronic diseases.  Has some medical history of hyperlipidemia, osteoarthritis of multiple sites nonrheumatic mitral valve regurgitation, coronary artery disease involving native coronary artery of native heart without angina pectoralis, osteoporosis, varicose veins, degenerative disc disease, presbyopia among other conditions. She denies any new acute issues this visit. Also here for Pap smear.    Past Medical History:  Diagnosis Date   Anemia    Arthritis    Hyperlipidemia    Insomnia    Mitral valve prolapse    PONV (postoperative nausea and vomiting)    Past Surgical History:  Procedure Laterality Date   AUGMENTATION  MAMMAPLASTY Bilateral    Dec 2019   BREAST BIOPSY Right    axillary node benign   KNEE SURGERY  1988   right   SHOULDER ARTHROSCOPY WITH LABRAL REPAIR Right 08/28/2012   Procedure: RIGHT SHOULDER ARTHROSCOPY WITH LABRAL DEBRIDEMENT, ASPIRATION AND DECOMPRESSION OF PARALABRAL CYST;  Surgeon: Marin Shutter, MD;  Location: [facility];  Service: Orthopedics;  Laterality: Right;   TOTAL SHOULDER ARTHROPLASTY Right 03/24/2020   Procedure: RIGHT REVERSE SHOULDER ARTHROPLASTY;  Surgeon: Justice Britain, MD;  Location: [facility];  Service: Orthopedics;  Laterality: Right;  148min   TOTAL SHOULDER ARTHROPLASTY Left 02/23/2021   Procedure: TOTAL SHOULDER ARTHROPLASTY;  Surgeon: Justice Britain, MD;  Location: [facility];  Service: Orthopedics;  Laterality: Left;    Allergies  Allergen Reactions   Codeine Nausea And Vomiting   Hydrocodone Nausea And Vomiting   Triple Antibiotic Pain Relief [Neomy-Bacit-Polymyx-Pramoxine] Other (See Comments)    Burn   Triple Antibiotic W/Hydrocortisone  [Bacitra-Neomycin-Polymyxin-Hc]    Contrast Media [Iodinated Contrast Media] Hives    After CT scan with contrast, pt had MRI without contrast. Pt informed MRI Tech that she had a rash on her legs. MRI Tech spoke with Radiologist after MRI was completed. Per Rad, since patient was here x 30 minutes after CT contrast and was doing fine, with rash fading, pt was allowed to leave facility. Alert and oriented. jmh    Allergies as of 05/07/2022       Reactions   Codeine Nausea And Vomiting   Hydrocodone Nausea And Vomiting   Triple Antibiotic Pain Relief [neomy-bacit-polymyx-pramoxine] Other (See Comments)  Burn   Triple Antibiotic W/hydrocortisone  [bacitra-neomycin-polymyxin-hc]    Contrast Media [iodinated Contrast Media] Hives   After CT scan with contrast, pt had MRI without contrast. Pt informed MRI Tech that she had a rash on her legs. MRI Tech spoke with Radiologist after MRI was completed. Per Rad, since patient was here x 30  minutes after CT contrast and was doing fine, with rash fading, pt was allowed to leave facility. Alert and oriented. jmh        Medication List        Accurate as of May 07, 2022  9:50 AM. If you have any questions, ask your nurse or doctor.          STOP taking these medications    Tri-Luma 0.01-4-0.05 % Crea Generic drug: Fluocin-Hydroquinone-Tretinoin Stopped by: Sandrea Hughs, NP       TAKE these medications    AMBULATORY NON FORMULARY MEDICATION Medication Name: Mindful Evening Cocoa Mix (Melatonin 3 mg, Theanine 5 mg, GABA 100 mg)   aspirin EC 81 MG tablet Take 1 tablet (81 mg total) by mouth daily. Swallow whole.   Black Cohosh 40 MG Caps Take 1 capsule by mouth in the morning and at bedtime.   CALCIUM CITRATE PO Take 600 mg by mouth 2 (two) times daily. Number varies each day based on calcium ingested in food   cholecalciferol 25 MCG (1000 UNIT) tablet Commonly known as: VITAMIN D3 Take 1,000 Units by mouth daily.   denosumab 60 MG/ML Sosy injection Commonly known as: PROLIA Inject 60 mg into the skin every 6 (six) months.   Efinaconazole 10 % Soln Apply 1 drop topically daily. What changed:  when to take this reasons to take this   Fish Oil 1000 MG Caps Take 1 capsule (1,000 mg total) by mouth daily.   hydroquinone 4 % cream Apply 1 Application topically to affected areas 2 (two) times daily.   multivitamin with minerals Tabs tablet Take 1 tablet by mouth daily.   Psyllium 100 % Powd   rosuvastatin 5 MG tablet Commonly known as: CRESTOR Take 1 tablet (5 mg total) by mouth daily.   tretinoin 0.05 % gel Commonly known as: ALTRALIN Apply 1 application topically at bedtime. What changed: Another medication with the same name was removed. Continue taking this medication, and follow the directions you see here. Changed by: Sandrea Hughs, NP        Review of Systems  Constitutional:  Negative for appetite change, chills, fatigue,  fever and unexpected weight change.  HENT:  Negative for congestion, dental problem, ear discharge, ear pain, facial swelling, hearing loss, nosebleeds, postnasal drip, rhinorrhea, sinus pressure, sinus pain, sneezing, sore throat, tinnitus and trouble swallowing.   Eyes:  Negative for pain, discharge, redness, itching and visual disturbance.  Respiratory:  Negative for cough, chest tightness, shortness of breath and wheezing.   Cardiovascular:  Negative for chest pain, palpitations and leg swelling.  Gastrointestinal:  Negative for abdominal distention, abdominal pain, blood in stool, constipation, diarrhea, nausea and vomiting.  Endocrine: Negative for cold intolerance, heat intolerance, polydipsia, polyphagia and polyuria.  Genitourinary:  Negative for difficulty urinating, dysuria, flank pain, frequency and urgency.  Musculoskeletal:  Positive for arthralgias. Negative for back pain, gait problem, joint swelling, myalgias, neck pain and neck stiffness.  Skin:  Negative for color change, pallor, rash and wound.  Neurological:  Negative for dizziness, syncope, speech difficulty, weakness, light-headedness, numbness and headaches.  Hematological:  Does not bruise/bleed easily.  Psychiatric/Behavioral:  Positive for sleep disturbance. Negative for agitation, behavioral problems, confusion, hallucinations, self-injury and suicidal ideas. The patient is not nervous/anxious.     Immunization History  Administered Date(s) Administered   Influenza, High Dose Seasonal PF 11/29/2021   Influenza,inj,quad, With Preservative 12/21/2019   Influenza-Unspecified 09/20/2018, 11/23/2019, 10/20/2020   PFIZER(Purple Top)SARS-COV-2 Vaccination 02/26/2019, 03/19/2019, 12/11/2019   Tdap 02/21/2015   Zoster Recombinat (Shingrix) 11/14/2017, 01/20/2018   Pertinent  Health Maintenance Due  Topic Date Due   PAP SMEAR-Modifier  02/07/2021   COLONOSCOPY (Pts 45-36yrs Insurance coverage will need to be confirmed)   06/14/2022 (Originally 02/19/2022)   DEXA SCAN  10/06/2022   MAMMOGRAM  10/11/2023   INFLUENZA VACCINE  Completed      04/23/2019    1:42 PM 12/31/2019    3:32 PM 11/04/2020    3:40 PM 05/03/2021    4:59 PM 05/07/2022    9:30 AM  [place] in the past year? 0 0 0 0 0  Was there an injury with Fall? 0 0 0 0 0  Fall Risk Category Calculator 0 0 0 0 0  Fall Risk Category (Retired) Low Low Low Low   (RETIRED) Patient Fall Risk Level Low fall risk Low fall risk Low fall risk Low fall risk   Patient at Risk for Falls Due to   No Fall Risks No Fall Risks No Fall Risks  Fall risk Follow up   Falls evaluation completed Falls evaluation completed    Functional Status Survey:    Vitals:   05/07/22 0931  BP: 108/72  Pulse: (!) 52  Resp: 16  Temp: 97.6 F (36.4 C)  TempSrc: Temporal  SpO2: 99%  Weight: 143 lb 9.6 oz (65.1 kg)  Height: 5' 10.28" (1.785 m)   Body mass index is 20.44 kg/m. Physical Exam Vitals reviewed. Exam conducted with a chaperone present Valerie Roys, CMA).  Constitutional:      General: She is not in acute distress.    Appearance: Normal appearance. She is normal weight. She is not ill-appearing or diaphoretic.  HENT:     Head: Normocephalic.     Right Ear: Tympanic membrane, ear canal and external ear normal. There is no impacted cerumen.     Left Ear: Tympanic membrane, ear canal and external ear normal. There is no impacted cerumen.     Nose: Nose normal. No congestion or rhinorrhea.     Mouth/Throat:     Mouth: Mucous membranes are moist.     Pharynx: Oropharynx is clear. No oropharyngeal exudate or posterior oropharyngeal erythema.  Eyes:     General: No scleral icterus.       Right eye: No discharge.        Left eye: No discharge.     Extraocular Movements: Extraocular movements intact.     Conjunctiva/sclera: Conjunctivae normal.     Pupils: Pupils are equal, round, and reactive to light.  Neck:     Vascular: No carotid bruit.   Cardiovascular:     Rate and Rhythm: Normal rate and regular rhythm.     Pulses: Normal pulses.     Heart sounds: Murmur heard.     No friction rub. No gallop.     Comments: Midsystolic click noted Pulmonary:     Effort: Pulmonary effort is normal. No respiratory distress.     Breath sounds: Normal breath sounds. No wheezing, rhonchi or rales.  Chest:     Chest wall: No tenderness.  Abdominal:  General: Bowel sounds are normal. There is no distension.     Palpations: Abdomen is soft. There is no mass.     Tenderness: There is no abdominal tenderness. There is no right CVA tenderness, left CVA tenderness, guarding or rebound.     Hernia: There is no hernia in the left inguinal area or right inguinal area.  Genitourinary:    Exam position: Lithotomy position.     Labia:        Right: No rash, tenderness, lesion or injury.        Left: No rash, tenderness, lesion or injury.      Urethra: No prolapse, urethral pain, urethral swelling or urethral lesion.     Vagina: Normal.     Cervix: Normal.     Uterus: Normal.      Adnexa: Right adnexa normal and left adnexa normal.  Musculoskeletal:        General: No swelling. Normal range of motion.     Right shoulder: Normal.     Left shoulder: Tenderness present. No swelling, effusion or crepitus. Normal range of motion. Normal strength. Normal pulse.     Cervical back: Normal range of motion. No rigidity or tenderness.     Right lower leg: No edema.     Left lower leg: No edema.  Lymphadenopathy:     Cervical: No cervical adenopathy.     Lower Body: No right inguinal adenopathy. No left inguinal adenopathy.  Skin:    General: Skin is warm and dry.     Coloration: Skin is not pale.     Findings: No bruising, erythema, lesion or rash.  Neurological:     Mental Status: She is alert and oriented to person, place, and time.     Cranial Nerves: No cranial nerve deficit.     Sensory: No sensory deficit.     Motor: No weakness.      Coordination: Coordination normal.     Gait: Gait normal.  Psychiatric:        Mood and Affect: Mood normal.        Speech: Speech normal.        Behavior: Behavior normal.        Thought Content: Thought content normal.        Judgment: Judgment normal.     Labs reviewed: Recent Labs    12/13/21 0928 12/25/21 1435  NA 135  --   K 4.3  --   CL 103  --   CO2 26  --   GLUCOSE 107*  --   BUN 22  --   CREATININE 0.54  --   CALCIUM 9.7 9.3   No results for input(s): "AST", "ALT", "ALKPHOS", "BILITOT", "PROT", "ALBUMIN" in the last 8760 hours. No results for input(s): "WBC", "NEUTROABS", "HGB", "HCT", "MCV", "PLT" in the last 8760 hours. Lab Results  Component Value Date   TSH 1.55 12/13/2021   No results found for: "HGBA1C" Lab Results  Component Value Date   CHOL 169 05/04/2021   HDL 67 05/04/2021   LDLCALC 89 05/04/2021   TRIG 44 05/04/2021   CHOLHDL 2.5 05/04/2021    Significant Diagnostic Results in last 30 days:  No results found.  Assessment/Plan  1. Annual physical exam Up to date with immunization. Medication and labs reviewed patient counselled regarding yearly exam, prevention of dental and periodontal disease, diet, regular sustained exercise for at least 30 minutes x 3 /week, recommended schedule for routine labs.  2. Primary osteoarthritis involving multiple  joints Continue to follow-up with orthopedic -Continue current over-the-counter analgesics - COMPLETE METABOLIC PANEL WITH GFR  3. Abnormal LFTs Negative exam finding.No alcohol use reported will recheck lab work - COMPLETE METABOLIC PANEL WITH GFR  4. Varicose 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 Stable -Continue to control high risk factors -Follow-up with vascular specialist if symptoms worsen - CBC with Differential/Platelet  5. Coronary artery disease involving native coronary artery of native heart without angina pectoris Chest pain-free Continue on aspirin, omega-3  fatty acids and rosuvastatin -Continue to follow-up with a cardiologist - CBC with Differential/Platelet - COMPLETE METABOLIC PANEL WITH GFR - Lipid Panel  6. Cervical cancer screening No discharge or bleeding noted.  Tolerated procedure well. Chaperone present throughout the exam Valerie Roys, CMA - PAP, Image Guided [[facility]]  Family/ staff Communication: Reviewed plan of care with patient verbalized understanding  Labs/tests ordered:  - PAP, Image Guided [[facility]] - CBC with Differential/Platelet - COMPLETE METABOLIC PANEL WITH GFR - Lipid Panel - CBC with Differential/Platelet  Next Appointment : Return in about 1 year (around 05/07/2023) for annual Physical examination.   Sandrea Hughs, NP

## 2022-05-08 LAB — CBC WITH DIFFERENTIAL/PLATELET
Absolute Monocytes: 445 cells/uL (ref 200–950)
Basophils Absolute: 49 cells/uL (ref 0–200)
Basophils Relative: 0.8 %
Eosinophils Absolute: 92 cells/uL (ref 15–500)
Eosinophils Relative: 1.5 %
HCT: 43.9 % (ref 35.0–45.0)
Hemoglobin: 15 g/dL (ref 11.7–15.5)
Lymphs Abs: 1025 cells/uL (ref 850–3900)
MCH: 33.3 pg — ABNORMAL HIGH (ref 27.0–33.0)
MCHC: 34.2 g/dL (ref 32.0–36.0)
MCV: 97.6 fL (ref 80.0–100.0)
MPV: 10.3 fL (ref 7.5–12.5)
Monocytes Relative: 7.3 %
Neutro Abs: 4490 cells/uL (ref 1500–7800)
Neutrophils Relative %: 73.6 %
Platelets: 262 10*3/uL (ref 140–400)
RBC: 4.5 10*6/uL (ref 3.80–5.10)
RDW: 11.8 % (ref 11.0–15.0)
Total Lymphocyte: 16.8 %
WBC: 6.1 10*3/uL (ref 3.8–10.8)

## 2022-05-08 LAB — LIPID PANEL
Cholesterol: 154 mg/dL (ref <200)
HDL: 65 mg/dL (ref >=50)
LDL Cholesterol (Calc): 77 mg/dL (calc)
Non-HDL Cholesterol (Calc): 89 mg/dL (calc) (ref <130)
Total CHOL/HDL Ratio: 2.4 (calc) (ref <5.0)
Triglycerides: 50 mg/dL (ref <150)

## 2022-05-08 LAB — COMPLETE METABOLIC PANEL WITH GFR
AG Ratio: 1.7 (calc) (ref 1.0–2.5)
ALT: 19 U/L (ref 6–29)
AST: 26 U/L (ref 10–35)
Albumin: 4.5 g/dL (ref 3.6–5.1)
Alkaline phosphatase (APISO): 45 U/L (ref 37–153)
BUN: 18 mg/dL (ref 7–25)
CO2: 27 mmol/L (ref 20–32)
Calcium: 9.5 mg/dL (ref 8.6–10.4)
Chloride: 99 mmol/L (ref 98–110)
Creat: 0.56 mg/dL (ref 0.50–1.05)
Globulin: 2.7 g/dL (calc) (ref 1.9–3.7)
Glucose, Bld: 78 mg/dL (ref 65–99)
Potassium: 4.1 mmol/L (ref 3.5–5.3)
Sodium: 138 mmol/L (ref 135–146)
Total Bilirubin: 0.4 mg/dL (ref 0.2–1.2)
Total Protein: 7.2 g/dL (ref 6.1–8.1)
eGFR: 104 mL/min/{1.73_m2} (ref >=60)

## 2022-05-09 ENCOUNTER — Ambulatory Visit: Payer: 59 | Admitting: Family

## 2022-05-09 LAB — PAP IG (IMAGE GUIDED)

## 2022-05-14 ENCOUNTER — Ambulatory Visit: Payer: 59 | Admitting: Cardiovascular Disease

## 2022-05-24 NOTE — Telephone Encounter (Signed)
Prolia VOB initiated via [email or web address]  Last OV: 12/26/21 Next OV:  Last Prolia inj 12/26/21 Next Prolia inj due 06/27/22

## 2022-05-30 ENCOUNTER — Ambulatory Visit: Payer: 59 | Admitting: Orthopaedic Surgery

## 2022-06-06 NOTE — Telephone Encounter (Signed)
COPAY CARD ENROLLMENT ELIGIBLE    Prior auth required for PROLIA  PA PROCESS DETAILS: Precertification is required. Call [phone] or complete the Precertification form available at [email or web address]

## 2022-06-08 ENCOUNTER — Ambulatory Visit (AMBULATORY_SURGERY_CENTER): Payer: 59 | Admitting: Gastroenterology

## 2022-06-08 ENCOUNTER — Telehealth: Payer: Self-pay | Admitting: Gastroenterology

## 2022-06-08 ENCOUNTER — Encounter: Payer: Self-pay | Admitting: Gastroenterology

## 2022-06-08 VITALS — BP 104/72 | HR 56 | Temp 97.8°F | Resp 13 | Ht 70.0 in | Wt 145.0 lb

## 2022-06-08 DIAGNOSIS — R110 Nausea: Secondary | ICD-10-CM | POA: Diagnosis not present

## 2022-06-08 DIAGNOSIS — R09A2 Foreign body sensation, throat: Secondary | ICD-10-CM

## 2022-06-08 DIAGNOSIS — R1310 Dysphagia, unspecified: Secondary | ICD-10-CM

## 2022-06-08 DIAGNOSIS — K2970 Gastritis, unspecified, without bleeding: Secondary | ICD-10-CM | POA: Diagnosis not present

## 2022-06-08 DIAGNOSIS — K2951 Unspecified chronic gastritis with bleeding: Secondary | ICD-10-CM | POA: Diagnosis not present

## 2022-06-08 DIAGNOSIS — Z1211 Encounter for screening for malignant neoplasm of colon: Secondary | ICD-10-CM

## 2022-06-08 DIAGNOSIS — K641 Second degree hemorrhoids: Secondary | ICD-10-CM | POA: Diagnosis not present

## 2022-06-08 DIAGNOSIS — K229 Disease of esophagus, unspecified: Secondary | ICD-10-CM | POA: Diagnosis not present

## 2022-06-08 MED ORDER — SODIUM CHLORIDE 0.9 % IV SOLN: 500.0000 mL | Freq: Once | INTRAVENOUS | Status: DC

## 2022-06-08 NOTE — Patient Instructions (Signed)
Please read handouts provided. Continue present medications. Await pathology results. Repeat colonoscopy in 10 years for screening. Return to GI office as needed. Resume previous diet.   YOU HAD AN ENDOSCOPIC PROCEDURE TODAY AT THE [facility]:   Refer to the procedure report that was given to you for any specific questions about what was found during the examination.  If the procedure report does not answer your questions, please call your gastroenterologist to clarify.  If you requested that your care partner not be given the details of your procedure findings, then the procedure report has been included in a sealed envelope for you to review at your convenience later.  YOU SHOULD EXPECT: Some feelings of bloating in the abdomen. Passage of more gas than usual.  Walking can help get rid of the air that was put into your GI tract during the procedure and reduce the bloating. If you had a lower endoscopy (such as a colonoscopy or flexible sigmoidoscopy) you may notice spotting of blood in your stool or on the toilet paper. If you underwent a bowel prep for your procedure, you may not have a normal bowel movement for a few days.  Please Note:  You might notice some irritation and congestion in your nose or some drainage.  This is from the oxygen used during your procedure.  There is no need for concern and it should clear up in a day or so.  SYMPTOMS TO REPORT IMMEDIATELY:  Following lower endoscopy (colonoscopy or flexible sigmoidoscopy):  Excessive amounts of blood in the stool  Significant tenderness or worsening of abdominal pains  Swelling of the abdomen that is new, acute  Fever of 100F or higher  Following upper endoscopy (EGD)  Vomiting of blood or coffee ground material  New chest pain or pain under the shoulder blades  Painful or persistently difficult swallowing  New shortness of breath  Fever of 100F or higher  Black, tarry-looking stools  For urgent or  emergent issues, a gastroenterologist can be reached at any hour by calling [phone]. Do not use MyChart messaging for urgent concerns.    DIET:  We do recommend a small meal at first, but then you may proceed to your regular diet.  Drink plenty of fluids but you should avoid alcoholic beverages for 24 hours.  ACTIVITY:  You should plan to take it easy for the rest of today and you should NOT DRIVE or use heavy machinery until tomorrow (because of the sedation medicines used during the test).    FOLLOW UP: Our staff will call the number listed on your records the next business day following your procedure.  We will call around 7:15- 8:00 am to check on you and address any questions or concerns that you may have regarding the information given to you following your procedure. If we do not reach you, we will leave a message.     If any biopsies were taken you will be contacted by phone or by letter within the next 1-3 weeks.  Please call us at [phone] if you have not heard about the biopsies in 3 weeks.    SIGNATURES/CONFIDENTIALITY: You and/or your care partner have signed paperwork which will be entered into your electronic medical record.  These signatures attest to the fact that that the information above on your After Visit Summary has been reviewed and is understood.  Full responsibility of the confidentiality of this discharge information lies with you and/or your care-partner.

## 2022-06-08 NOTE — Progress Notes (Signed)
Called to room to assist during endoscopic procedure.  Patient ID and intended procedure confirmed with present staff. Received instructions for my participation in the procedure from the performing physician.  

## 2022-06-08 NOTE — Op Note (Signed)
[facility] Patient Name: Tara [name] Procedure Date: 06/08/2022 3:27 PM MRN: [id number] Endoscopist: Doristine Locks , MD, [id number] Age: 62 Referring MD:  Date of Birth: 1960/11/14 Gender: Female Account #: [id number] Procedure:                Colonoscopy Indications:              Screening for colorectal malignant neoplasm, This                            is the patient's first colonoscopy Medicines:                Monitored Anesthesia Care Procedure:                Pre-Anesthesia Assessment:                           - Prior to the procedure, a History and Physical                            was performed, and patient medications and                            allergies were reviewed. The patient's tolerance of                            previous anesthesia was also reviewed. The risks                            and benefits of the procedure and the sedation                            options and risks were discussed with the patient.                            All questions were answered, and informed consent                            was obtained. Prior Anticoagulants: The patient has                            taken no anticoagulant or antiplatelet agents. ASA                            Grade Assessment: II - A patient with mild systemic                            disease. After reviewing the risks and benefits,                            the patient was deemed in satisfactory condition to                            undergo the procedure.  After obtaining informed consent, the colonoscope                            was passed under direct vision. Throughout the                            procedure, the patient's blood pressure, pulse, and                            oxygen saturations were monitored continuously. The                            Olympus CF-HQ190L ([id number]) Colonoscope was                            introduced through the  anus and advanced to the the                            terminal ileum. The colonoscopy was performed                            without difficulty. The patient tolerated the                            procedure well. The quality of the bowel                            preparation was good. The terminal ileum, ileocecal                            valve, appendiceal orifice, and rectum were                            photographed. Scope In: 3:56:51 PM Scope Out: 4:14:08 PM Scope Withdrawal Time: 0 hours 11 minutes 3 seconds  Total Procedure Duration: 0 hours 17 minutes 17 seconds  Findings:                 Hemorrhoids were found on perianal exam.                           The entire colon appeared normal.                           Non-bleeding internal hemorrhoids were found during                            retroflexion. The hemorrhoids were small and Grade                            II (internal hemorrhoids that prolapse but reduce                            spontaneously).  The terminal ileum appeared normal. Complications:            No immediate complications. Estimated Blood Loss:     Estimated blood loss: none. Impression:               - Hemorrhoids found on perianal exam.                           - The entire examined colon is normal.                           - Non-bleeding internal hemorrhoids.                           - The examined portion of the ileum was normal.                           - No specimens collected. Recommendation:           - Patient has a contact number available for                            emergencies. The signs and symptoms of potential                            delayed complications were discussed with the                            patient. Return to normal activities tomorrow.                            Written discharge instructions were provided to the                            patient.                           -  Resume previous diet.                           - Continue present medications.                           - Repeat colonoscopy in 10 years for screening                            purposes.                           - Return to GI office PRN. Doristine Locks, MD 06/08/2022 4:24:44 PM

## 2022-06-08 NOTE — Telephone Encounter (Signed)
Inbound call from patient, feels the Clenpiq is not clearing her out and her procedure is today at 3:30 PM, would like to speak with a nurse to discuss further steps.

## 2022-06-08 NOTE — Progress Notes (Signed)
Uneventful anesthetic. Report to pacu rn. Vss. Care resumed by rn. 

## 2022-06-08 NOTE — Progress Notes (Signed)
1540 patient complaining of stomach cramps, assisted with walking. Patient requesting to use bathroom, husband accompanying patient in bathroom. B.Leightyn Cina RN.

## 2022-06-08 NOTE — Telephone Encounter (Signed)
Pt stated that she did the first half of prep, and only had 1 Bowel Movement. Started the second half of prep at 5am, and has only has 1 BM. Advised pt to get 119g of miralax, and 32oz of gatorade. Explained to patient that she needs to mix the two together, and drink 8oz every until gone. Instructed patient to drink clear liquids until 12:30pm. Also, advised pt to call back & ask for Maralyn Sago Monday, RN or Lupita Leash, CMA in admitting at 11:45 if stools have not cleared up, or if she is still having solid bowel movements. Pt verbalized understanding, and had no further concerns at the end of the call.

## 2022-06-08 NOTE — Progress Notes (Signed)
VS by DT  Pt's states no medical or surgical changes since previsit or office visit.  

## 2022-06-08 NOTE — Op Note (Signed)
[facility] Patient Name: Tara [name] Procedure Date: 06/08/2022 3:29 PM MRN: [id number] Endoscopist: Doristine Locks , MD, [id number] Age: 62 Referring MD:  Date of Birth: [birth year] Gender: Female Account #: [id number] Procedure:                Upper GI endoscopy Indications:              Dysphagia, Globus sensation, Nausea Medicines:                Monitored Anesthesia Care Procedure:                Pre-Anesthesia Assessment:                           - Prior to the procedure, a History and Physical                            was performed, and patient medications and                            allergies were reviewed. The patient's tolerance of                            previous anesthesia was also reviewed. The risks                            and benefits of the procedure and the sedation                            options and risks were discussed with the patient.                            All questions were answered, and informed consent                            was obtained. Prior Anticoagulants: The patient has                            taken no anticoagulant or antiplatelet agents. ASA                            Grade Assessment: II - A patient with mild systemic                            disease. After reviewing the risks and benefits,                            the patient was deemed in satisfactory condition to                            undergo the procedure.                           After obtaining informed consent, the endoscope was  passed under direct vision. Throughout the                            procedure, the patient's blood pressure, pulse, and                            oxygen saturations were monitored continuously. The                            GIF [id number] #[id number] was introduced through the                            mouth, and advanced to the second part of duodenum.                            The upper GI  endoscopy was accomplished without                            difficulty. The patient tolerated the procedure                            well. Scope In: Scope Out: Findings:                 Mucosal changes including longitudinal furrows were                            found in the lower third of the esophagus.                            Esophageal findings were graded using the                            Eosinophilic Esophagitis Endoscopic Reference Score                            (EoE-EREFS) as: Edema Grade 0 Normal (distinct                            vascular markings), Rings Grade 0 None (no ridges                            or rings seen), Exudates Grade 0 None (no white                            lesions seen), Furrows Grade 1 Mild (vertical lines                            without visible depth) and Stricture none (no                            stricture found). Given the endoscopic appearance  and symptoms of dysphagia, the decision was made to                            perform esophageal dilation. A guidewire was placed                            and the scope was withdrawn. Dilation was performed                            with a Savary dilator with no resistance at 17 mm.                            The dilation site was examined following endoscope                            reinsertion and showed no bleeding, mucosal tear or                            perforation. Biopsies were then obtained from the                            proximal and distal esophagus with cold forceps for                            histology of suspected eosinophilic esophagitis.                            Estimated blood loss was minimal.                           The Z-line was regular and was found 40 cm from the                            incisors.                           Scattered minimal inflammation characterized by                            erythema was found in the  gastric body and in the                            gastric antrum. Biopsies were taken with a cold                            forceps for histology. Estimated blood loss was                            minimal.                           The examined duodenum was normal. Complications:            No immediate complications. Estimated Blood Loss:  Estimated blood loss was minimal. Impression:               - Subtle esophageal mucosal changes in the lower                            esophagus. Dilated with 17 mm Savary dilator                            without mucosal rent. Biopsies were taken in the                            distal and proximal esophagus with a cold forceps                            for evaluation of eosinophilic esophagitis.                           - Z-line regular, 40 cm from the incisors.                           - Minimal, non-ulcer gastritis. Biopsied.                           - Normal examined duodenum. Recommendation:           - Patient has a contact number available for                            emergencies. The signs and symptoms of potential                            delayed complications were discussed with the                            patient. Return to normal activities tomorrow.                            Written discharge instructions were provided to the                            patient.                           - Resume previous diet.                           - Continue present medications.                           - Await pathology results.                           - Repeat upper endoscopy PRN.                           - Perform a colonoscopy today.                           -  Return to GI clinic PRN. Doristine Locks, MD 06/08/2022 4:22:16 PM

## 2022-06-08 NOTE — Telephone Encounter (Signed)
Attempted to call pt. Left message. After leaving message and opening chart it appears another team member was already in the process of speaking to pt. See other note.

## 2022-06-08 NOTE — Progress Notes (Signed)
GASTROENTEROLOGY PROCEDURE H&P NOTE   Primary Care Physician: [name], Donalee Citrin, NP    Reason for Procedure:   Nausea, dysphagia, CRC screening, globus sensation  Plan:    EGD, colonoscopy  Patient is appropriate for endoscopic procedure(s) in the ambulatory ([facility]) setting.  The nature of the procedure, as well as the risks, benefits, and alternatives were carefully and thoroughly reviewed with the patient. Ample time for discussion and questions allowed. The patient understood, was satisfied, and agreed to proceed.     HPI: Tara [name] is a 62 y.o. female who presents for EGD for evaluation of nausea, dysphagia, and globus sensation, along with colonoscopy for CRC screening.    Past Medical History:  Diagnosis Date   Anemia    Arthritis    Hyperlipidemia    Insomnia    Mitral valve prolapse    PONV (postoperative nausea and vomiting)     Past Surgical History:  Procedure Laterality Date   AUGMENTATION MAMMAPLASTY Bilateral    Dec 2019   BREAST BIOPSY Right    axillary node benign   KNEE SURGERY  1988   right   SHOULDER ARTHROSCOPY WITH LABRAL REPAIR Right 08/28/2012   Procedure: RIGHT SHOULDER ARTHROSCOPY WITH LABRAL DEBRIDEMENT, ASPIRATION AND DECOMPRESSION OF PARALABRAL CYST;  Surgeon: Senaida Lange, MD;  Location: [facility] OR;  Service: Orthopedics;  Laterality: Right;   TOTAL SHOULDER ARTHROPLASTY Right 03/24/2020   Procedure: RIGHT REVERSE SHOULDER ARTHROPLASTY;  Surgeon: Francena Hanly, MD;  Location: [facility] ORS;  Service: Orthopedics;  Laterality: Right;    TOTAL SHOULDER ARTHROPLASTY Left 02/23/2021   Procedure: TOTAL SHOULDER ARTHROPLASTY;  Surgeon: Francena Hanly, MD;  Location: [facility] ORS;  Service: Orthopedics;  Laterality: Left;    Prior to Admission medications   Medication Sig Start Date End Date Taking? Authorizing Provider  AMBULATORY NON FORMULARY MEDICATION Medication Name: Mindful Evening Cocoa Mix (Melatonin 3 mg, Theanine 5 mg, GABA 100 mg) 09/05/21   Yes [name], [name], NP  aspirin EC 81 MG tablet Take 1 tablet (81 mg total) by mouth daily. Swallow whole. 03/14/22  Yes Kathleene Hazel, MD  Black Cohosh 40 MG CAPS Take 1 capsule by mouth in the morning and at bedtime.   Yes [provider]  CALCIUM CITRATE PO Take 600 mg by mouth 2 (two) times daily. Number varies each day based on calcium ingested in food   Yes [provider]  cholecalciferol (VITAMIN D3) 25 MCG (1000 UNIT) tablet Take 1,000 Units by mouth daily.   Yes [provider]  Multiple Vitamin (MULTIVITAMIN WITH MINERALS) TABS Take 1 tablet by mouth daily.   Yes [provider]  Omega-3 Fatty Acids (FISH OIL) 1000 MG CAPS Take 1 capsule (1,000 mg total) by mouth daily. 09/19/21  Yes [name], [name], NP  Psyllium 100 % POWD  04/20/22  Yes [provider]  rosuvastatin (CRESTOR) 5 MG tablet Take 1 tablet (5 mg total) by mouth daily. 03/14/22  Yes Kathleene Hazel, MD  denosumab (PROLIA) 60 MG/ML SOSY injection Inject 60 mg into the skin every 6 (six) months.    [provider]  Efinaconazole 10 % SOLN Apply 1 drop topically daily. Patient taking differently: Apply 1 drop topically daily as needed (Toe Nail). 09/14/20   Vivi Barrack, DPM  hydroquinone 4 % cream Apply 1 Application topically to affected areas 2 (two) times daily. 04/10/22     tretinoin (ALTRALIN) 0.05 % gel Apply 1 application topically at bedtime. 06/08/21  [name], [name], NP    Current Outpatient Medications  Medication Sig Dispense Refill   AMBULATORY NON FORMULARY MEDICATION Medication Name: Mindful Evening Cocoa Mix (Melatonin 3 mg, Theanine 5 mg, GABA 100 mg)     aspirin EC 81 MG tablet Take 1 tablet (81 mg total) by mouth daily. Swallow whole. 90 tablet 3   Black Cohosh 40 MG CAPS Take 1 capsule by mouth in the morning and at bedtime.     CALCIUM CITRATE PO Take 600 mg by mouth 2 (two) times daily. Number varies each day based on calcium  ingested in food     cholecalciferol (VITAMIN D3) 25 MCG (1000 UNIT) tablet Take 1,000 Units by mouth daily.     Multiple Vitamin (MULTIVITAMIN WITH MINERALS) TABS Take 1 tablet by mouth daily.     Omega-3 Fatty Acids (FISH OIL) 1000 MG CAPS Take 1 capsule (1,000 mg total) by mouth daily. 30 capsule 0   Psyllium 100 % POWD      rosuvastatin (CRESTOR) 5 MG tablet Take 1 tablet (5 mg total) by mouth daily. 90 tablet 3   denosumab (PROLIA) 60 MG/ML SOSY injection Inject 60 mg into the skin every 6 (six) months.     Efinaconazole 10 % SOLN Apply 1 drop topically daily. (Patient taking differently: Apply 1 drop topically daily as needed (Toe Nail).) 4 mL 11   hydroquinone 4 % cream Apply 1 Application topically to affected areas 2 (two) times daily. 56.7 g 3   tretinoin (ALTRALIN) 0.05 % gel Apply 1 application topically at bedtime. 45 g 3   Current Facility-Administered Medications  Medication Dose Route Frequency Provider Last Rate Last Admin   0.9 %  sodium chloride infusion  500 mL Intravenous Once Yanelie Abraha V, DO        Allergies as of 06/08/2022 - Review Complete 06/08/2022  Allergen Reaction Noted   Codeine Nausea And Vomiting 01/12/2009   Hydrocodone Nausea And Vomiting 06/20/2010   Triple antibiotic pain relief [neomy-bacit-polymyx-pramoxine] Other (See Comments) 02/23/2019   Triple antibiotic w/hydrocortisone  [bacitra-neomycin-polymyxin-hc]  04/23/2019   Contrast media [iodinated contrast media] Hives 09/16/2020    Family History  Problem Relation Age of Onset   Stroke Mother    Heart disease Mother    Heart disease Father    Diabetes Sister    Heart disease Sister    Diabetes Sister    Heart attack Brother 58   Diabetes Brother    Heart disease Brother    Sudden death Neg Hx    Hypertension Neg Hx    Hyperlipidemia Neg Hx    Breast cancer Neg Hx    Colon cancer Neg Hx    Esophageal cancer Neg Hx    Rectal cancer Neg Hx    Stomach cancer Neg Hx     Social  History   Socioeconomic History   Marital status: Married    Spouse name: Not on file   Number of children: 1   Years of education: Not on file   Highest education level: Not on file  Occupational History   Occupation: RN  Tobacco Use   Smoking status: Never    Passive exposure: Never   Smokeless tobacco: Never  Vaping Use   Vaping Use: Never used  Substance and Sexual Activity   Alcohol use: No   Drug use: No   Sexual activity: Not on file  Other Topics Concern   Not on file  Social History Narrative   Tobacco use,  amount per day now: 0   Past tobacco use, amount per day: 0   How many years did you use tobacco: 0   Alcohol use (drinks per week): 0   Diet: Heart Healthy/DASH Diet/ Lean and Clean type foods.   Do you drink/eat things with caffeine: 1 glass of Tea in the morning.   Marital status: Married                                 What year were you married? 1987   Do you live in a house, apartment, assisted living, condo, trailer, etc.? House   Is it one or more stories? Yes   How many persons live in your home? 2 ( Husband/ Self )   Do you have pets in your home?( please list) Not currently, Dog Deceased.    Current or past profession: PhD/RN   Do you exercise?    Yes                              Type and how often? 6 Days a week.   Do you have a living will? Yes   Do you have a DNR form?   No                               If not, do you want to discuss one? Yes   Do you have signed POA/HPOA forms? Yes                       If so, please bring to you appointment   Social Determinants of Health   Financial Resource Strain: Not on file  Food Insecurity: Not on file  Transportation Needs: Not on file  Physical Activity: Not on file  Stress: Not on file  Social Connections: Not on file  Intimate Partner Violence: Not on file    Physical Exam: Vital signs in last 24 hours:  119/81   Pulse 61   Temp 97.8 F (36.6 C)   Resp (!) 8   Ht  (1.778 m)   Wt  145 lb (65.8 kg)   LMP 08/14/2012   SpO2 100%   BMI 20.81 kg/m  GEN: NAD EYE: Sclerae anicteric ENT: MMM CV: Non-tachycardic Pulm: CTA b/l GI: Soft, NT/ND NEURO:  Alert & Oriented x 3   Doristine Locks, DO [facility]   06/08/2022 3:29 PM

## 2022-06-11 ENCOUNTER — Telehealth: Payer: Self-pay

## 2022-06-11 ENCOUNTER — Other Ambulatory Visit: Payer: Self-pay

## 2022-06-11 NOTE — Telephone Encounter (Signed)
  Follow up Call-     06/08/2022    2:57 PM  Call back number  Post procedure Call Back phone  # [phone]  Permission to leave phone message Yes     Patient questions:  Do you have a fever, pain , or abdominal swelling? No. Pain Score  0 *  Have you tolerated food without any problems? No.  Have you been able to return to your normal activities? Yes.    Do you have any questions about your discharge instructions: Diet   Yes.   Medications  No. Follow up visit  No.  Do you have questions or concerns about your Care? No.  Actions: * If pain score is 4 or above: No action needed, pain <4.  Dr. Barron Alvine, On call backs this am, pt states she is having delayed swallowing when trying to eat.  She states it feels like liquid and foods are getting stuck.  She has been following a soft diet.  She is also c/o "burning."  No fever or swelling at neck.   Please advise.  Thanks, [employer]

## 2022-06-11 NOTE — Telephone Encounter (Signed)
Reached out to patient with MD recommendations. She declined the Carafate rx  and wants to continue with drinking warm liquids to relieve some of the discomfort. She feels that she needs more healing time and agrees to call us back if symptoms don't improve or worsen

## 2022-06-14 ENCOUNTER — Encounter: Payer: Self-pay | Admitting: Gastroenterology

## 2022-06-15 ENCOUNTER — Other Ambulatory Visit (HOSPITAL_COMMUNITY): Payer: Self-pay

## 2022-06-15 NOTE — Telephone Encounter (Signed)
Prior Authorization initiated for [facility] via [employer] Case ID: [id number]

## 2022-06-21 NOTE — Progress Notes (Signed)
[name] [address] [place] [address] Phone: [phone] Subjective:   [name], am serving as a scribe for Dr. Antoine Primas.  I'm seeing this patient by the request  of:  [name], [name], NP  CC: back and neck pain follow up   [id number]  Tara [name] is a 62 y.o. female coming in with complaint of back and neck pain. OMT on 12/25/2021. Patient states that her entire spine is painful.  Patient is wondering what else she can possibly do.  Continues to try to stay active but finds it more more difficult at this time.          Reviewed prior external information including notes and imaging from previsou exam, outside providers and external EMR if available.   As well as notes that were available from care everywhere and other healthcare systems.  Past medical history, social, surgical and family history all reviewed in electronic medical record.  No pertanent information unless stated regarding to the chief complaint.   Past Medical History:  Diagnosis Date   Anemia    Arthritis    Hyperlipidemia    Insomnia    Mitral valve prolapse    PONV (postoperative nausea and vomiting)     Allergies  Allergen Reactions   Codeine Nausea And Vomiting   Hydrocodone Nausea And Vomiting   Triple Antibiotic Pain Relief [Neomy-Bacit-Polymyx-Pramoxine] Other (See Comments)    Burn   Triple Antibiotic W/Hydrocortisone  [Bacitra-Neomycin-Polymyxin-Hc]    Contrast Media [Iodinated Contrast Media] Hives    After CT scan with contrast, pt had MRI without contrast. Pt informed MRI Tech that she had a rash on her legs. MRI Tech spoke with Radiologist after MRI was completed. Per Rad, since patient was here x 30 minutes after CT contrast and was doing fine, with rash fading, pt was allowed to leave facility. Alert and oriented. jmh     Review of Systems:  No headache, visual changes,, vomiting, diarrhea, constipation, dizziness,  abdominal pain, skin rash, fevers, chills, night sweats, weight loss, swollen lymph nodes,  chest pain, shortness of breath, mood changes. POSITIVE muscle aches, nausea, body aches, has to continue to eat small meals because secondary to difficulty with swallowing  Objective  Blood pressure 100/74, pulse (!) 55, height 5\' 10"  (1.778 m), weight 146 lb (66.2 kg), last menstrual period 08/14/2012, SpO2 96 %.   General: No apparent distress alert and oriented x3 mood and affect normal, dressed appropriately.  HEENT: Pupils equal, extraocular movements intact  Respiratory: Patient's speak in full sentences and does not appear short of breath  Cardiovascular: No lower extremity edema, non tender, no erythema  Loss of lordosis tightness noted with Pearlean Brownie, tightness patient does have significant arthritic or postsurgical changes of the shoulders noted at this time.  Atrophy of some of the musculature.  Osteopathic findings  C2 flexed rotated and side bent right C6 flexed rotated and side bent left T2 extended rotated and side bent right inhaled rib T8 extended rotated and side bent left L3 flexed rotated and side bent right L5 flexed rotated and side bent left Sacrum right on right       Assessment and Plan:  DDD (degenerative disc disease), cervical Degenerative disc of the cervical spine noted.  Discussed icing regimen and home exercises.  Discussed which activities to do and which ones to avoid.  We discussed that possibly using Effexor would be beneficial.  Patient will read about it but  did not want to make any other big changes at this time.  Seeing other specialist for her mitral valve as well as chronic nausea.  Undergoing a breath test in the near future as well as a TEE.  Patient would like to have these done before she would start any medications.  Follow-up with me again in 4 to 6 weeks    Nonallopathic problems  Decision today to treat with OMT was based on Physical Exam  After  verbal consent patient was treated with HVLA, ME, FPR techniques in cervical, rib, thoracic, lumbar, and sacral  areas  Patient tolerated the procedure well with improvement in symptoms  Patient given exercises, stretches and lifestyle modifications  See medications in patient instructions if given  Patient will follow up in 4-8 weeks    The above documentation has been reviewed and is accurate and complete Judi Saa, DO          Note: This dictation was prepared with Dragon dictation along with smaller phrase technology. Any transcriptional errors that result from this process are unintentional.

## 2022-06-22 ENCOUNTER — Encounter: Payer: Self-pay | Admitting: Family Medicine

## 2022-06-22 ENCOUNTER — Ambulatory Visit (INDEPENDENT_AMBULATORY_CARE_PROVIDER_SITE_OTHER): Payer: 59 | Admitting: Family Medicine

## 2022-06-22 ENCOUNTER — Telehealth: Payer: Self-pay

## 2022-06-22 VITALS — BP 100/74 | HR 55 | Ht 70.0 in | Wt 146.0 lb

## 2022-06-22 DIAGNOSIS — M9902 Segmental and somatic dysfunction of thoracic region: Secondary | ICD-10-CM | POA: Diagnosis not present

## 2022-06-22 DIAGNOSIS — M9904 Segmental and somatic dysfunction of sacral region: Secondary | ICD-10-CM | POA: Diagnosis not present

## 2022-06-22 DIAGNOSIS — M9903 Segmental and somatic dysfunction of lumbar region: Secondary | ICD-10-CM | POA: Diagnosis not present

## 2022-06-22 DIAGNOSIS — M5030 Other cervical disc degeneration, unspecified cervical region: Secondary | ICD-10-CM | POA: Diagnosis not present

## 2022-06-22 DIAGNOSIS — M9901 Segmental and somatic dysfunction of cervical region: Secondary | ICD-10-CM | POA: Diagnosis not present

## 2022-06-22 DIAGNOSIS — M9908 Segmental and somatic dysfunction of rib cage: Secondary | ICD-10-CM | POA: Diagnosis not present

## 2022-06-22 NOTE — Assessment & Plan Note (Addendum)
Degenerative disc of the cervical spine noted.  Discussed icing regimen and home exercises.  Discussed which activities to do and which ones to avoid.  We discussed that possibly using Effexor would be beneficial.  Patient will read about it but did not want to make any other big changes at this time.  Seeing other specialist for her mitral valve as well as chronic nausea.  Undergoing a breath test in the near future as well as a TEE.  Patient would like to have these done before she would start any medications.  Follow-up with me again in 4 to 6 weeks total time reviewing patient's imaging, previous notes from cardiology as well as gastroenterology and discussing with patient 36 minutes

## 2022-06-22 NOTE — Telephone Encounter (Signed)
APPROVED  PA# [id number] Valid: 06/15/22-06/15/23

## 2022-06-22 NOTE — Patient Instructions (Signed)
Good to see you Read about Effexor See me in 5-6 weeks

## 2022-06-22 NOTE — Telephone Encounter (Signed)
Pt ready for scheduling on or after 06/27/22  Out-of-pocket cost due at time of visit: $462 ($327 after deductible has been met)  Primary: [employer] [place] Prolia co-insurance: 20% (approximately $302) Admin fee co-insurance: 20% (approximately $25)  Deductible: $3000 of $3200 met  Prior Auth: APPROVED PA# [id number] Valid: 06/15/22-06/15/23  Secondary: N/A Prolia co-insurance:  Admin fee co-insurance:  Deductible:  Prior Auth:  PA# Valid:   ** This summary of benefits is an estimation of the patient's out-of-pocket cost. Exact cost may vary based on individual plan coverage.

## 2022-06-22 NOTE — Telephone Encounter (Signed)
Pharmacy Patient Advocate Encounter  Received notification via FAX that Prior Authorization for Prolia has been approved  PA # [id number] Effective dates: 06/15/22 through 06/15/23

## 2022-06-25 NOTE — Telephone Encounter (Signed)
[facility] order and insurance card have been faxed to [facility] (P: [phone], F: [phone])

## 2022-06-27 ENCOUNTER — Ambulatory Visit: Payer: 59

## 2022-06-27 VITALS — BP 120/80 | HR 72 | Ht 70.0 in | Wt 146.0 lb

## 2022-06-27 DIAGNOSIS — R600 Localized edema: Secondary | ICD-10-CM | POA: Diagnosis not present

## 2022-06-27 DIAGNOSIS — M810 Age-related osteoporosis without current pathological fracture: Secondary | ICD-10-CM

## 2022-06-27 DIAGNOSIS — R252 Cramp and spasm: Secondary | ICD-10-CM | POA: Diagnosis not present

## 2022-06-27 DIAGNOSIS — I8312 Varicose veins of left lower extremity with inflammation: Secondary | ICD-10-CM | POA: Diagnosis not present

## 2022-06-27 DIAGNOSIS — I8311 Varicose veins of right lower extremity with inflammation: Secondary | ICD-10-CM | POA: Diagnosis not present

## 2022-06-27 MED ORDER — DENOSUMAB 60 MG/ML ~~LOC~~ SOSY
60.0000 mg | PREFILLED_SYRINGE | Freq: Once | SUBCUTANEOUS | Status: AC
  Administered 2022-06-27: 60 mg via SUBCUTANEOUS

## 2022-06-27 NOTE — Progress Notes (Signed)
After obtaining consent, and per orders of Dr.Shamleffer, injection of Prolia 60mg /mL given by Pollie Meyer in the Left arm. Patient instructed to remain in clinic for 20 minutes afterwards, and to report any adverse reaction to me immediately.

## 2022-06-29 ENCOUNTER — Ambulatory Visit: Payer: 59 | Attending: Cardiovascular Disease | Admitting: Cardiovascular Disease

## 2022-06-29 ENCOUNTER — Encounter: Payer: Self-pay | Admitting: *Deleted

## 2022-06-29 ENCOUNTER — Encounter: Payer: Self-pay | Admitting: Cardiovascular Disease

## 2022-06-29 VITALS — BP 112/78 | HR 59 | Ht 70.0 in | Wt 148.4 lb

## 2022-06-29 DIAGNOSIS — I2510 Atherosclerotic heart disease of native coronary artery without angina pectoris: Secondary | ICD-10-CM

## 2022-06-29 DIAGNOSIS — I340 Nonrheumatic mitral (valve) insufficiency: Secondary | ICD-10-CM

## 2022-06-29 DIAGNOSIS — R0602 Shortness of breath: Secondary | ICD-10-CM

## 2022-06-29 NOTE — Progress Notes (Signed)
Chief Complaint  Patient presents with   Follow-up    Mitral regurgitation    History of Present Illness: 62 yo female with history of anemia, arthritis, hyperlipidemia and mitral valve prolapse/mitral regurgitation who is here today for cardiac follow up. I saw her as a new patient for the evaluation of her mitral valve disease in October 2023. Echo in 2012 with normal LV function and mild to moderate mitral regurgitation. She was lost to follow up in our office from 2012 to 2023. When I saw her in October 2023 she described severe dyspnea on exertion. No chest pain. She had edema in both ankles at the end of the day. Echo January 2024 with LVEF=60-65%, normal RV function. Moderate mitral regurgitation but unable to fully estimate degree of severity of the MR. Coronary CTA 03/02/22 with minimal plaque in the LAD and RCA, calcium score of zero. She was started on a statin and ASA. I saw her in the office in February 2024 and she remained dyspneic. We discussed arranging a TEE but delayed this until she had a GI workup. She had upper endoscopy performed on 06/08/22 and was found to have mucosal changes with longitudinal furrows but no strictures. Procedure note indicates that given the endoscopic appearance and symptoms of dysphagia, esophageal dilatation was performed.   She is here today for follow up. The patient denies any chest pain, palpitations, lower extremity edema, orthopnea, PND, dizziness, near syncope or syncope. She continues to have dyspnea with exertion. She continues to have difficulty swallowing and discomfort with swallowing.   Primary Care Physician: Caesar Bookman, NP   Past Medical History:  Diagnosis Date   Anemia    Arthritis    Hyperlipidemia    Insomnia    Mitral valve prolapse    PONV (postoperative nausea and vomiting)     Past Surgical History:  Procedure Laterality Date   AUGMENTATION MAMMAPLASTY Bilateral    Dec 2019   BREAST BIOPSY Right    axillary  node benign   KNEE SURGERY  1988   right   SHOULDER ARTHROSCOPY WITH LABRAL REPAIR Right 08/28/2012   Procedure: RIGHT SHOULDER ARTHROSCOPY WITH LABRAL DEBRIDEMENT, ASPIRATION AND DECOMPRESSION OF PARALABRAL CYST;  Surgeon: Senaida Lange, MD;  Location: [facility];  Service: Orthopedics;  Laterality: Right;   TOTAL SHOULDER ARTHROPLASTY Right 03/24/2020   Procedure: RIGHT REVERSE SHOULDER ARTHROPLASTY;  Surgeon: Francena Hanly, MD;  Location: [facility];  Service: Orthopedics;  Laterality: Right;    TOTAL SHOULDER ARTHROPLASTY Left 02/23/2021   Procedure: TOTAL SHOULDER ARTHROPLASTY;  Surgeon: Francena Hanly, MD;  Location: [facility];  Service: Orthopedics;  Laterality: Left;    Current Outpatient Medications  Medication Sig Dispense Refill   AMBULATORY NON FORMULARY MEDICATION Medication Name: Mindful Evening Cocoa Mix (Melatonin 3 mg, Theanine 5 mg, GABA 100 mg)     aspirin EC 81 MG tablet Take 1 tablet (81 mg total) by mouth daily. Swallow whole. 90 tablet 3   Black Cohosh 40 MG CAPS Take 1 capsule by mouth in the morning and at bedtime.     CALCIUM CITRATE PO Take 600 mg by mouth 2 (two) times daily. Number varies each day based on calcium ingested in food     cholecalciferol (VITAMIN D3) 25 MCG (1000 UNIT) tablet Take 1,000 Units by mouth daily.     denosumab (PROLIA) 60 MG/ML SOSY injection Inject 60 mg into the skin every 6 (six) months.     Efinaconazole 10 % SOLN  Apply 1 drop topically daily. (Patient taking differently: Apply 1 drop topically daily as needed (Toe Nail).) 4 mL 11   hydroquinone 4 % cream Apply 1 Application topically to affected areas 2 (two) times daily. 56.7 g 3   Multiple Vitamin (MULTIVITAMIN WITH MINERALS) TABS Take 1 tablet by mouth daily.     Omega-3 Fatty Acids (FISH OIL) 1000 MG CAPS Take 1 capsule (1,000 mg total) by mouth daily. 30 capsule 0   Psyllium 100 % POWD      rosuvastatin (CRESTOR) 5 MG tablet Take 1 tablet (5 mg total) by mouth daily. 90 tablet 3    tretinoin (ALTRALIN) 0.05 % gel Apply 1 application topically at bedtime. 45 g 3   No current facility-administered medications for this visit.    Allergies  Allergen Reactions   Codeine Nausea And Vomiting   Hydrocodone Nausea And Vomiting   Triple Antibiotic Pain Relief [Neomy-Bacit-Polymyx-Pramoxine] Other (See Comments)    Burn   Triple Antibiotic W/Hydrocortisone  [Bacitra-Neomycin-Polymyxin-Hc]    Contrast Media [Iodinated Contrast Media] Hives    After CT scan with contrast, pt had MRI without contrast. Pt informed MRI Tech that she had a rash on her legs. MRI Tech spoke with Radiologist after MRI was completed. Per Rad, since patient was here x 30 minutes after CT contrast and was doing fine, with rash fading, pt was allowed to leave facility. Alert and oriented. jmh    Social History   Socioeconomic History   Marital status: Married    Spouse name: Not on file   Number of children: 1   Years of education: Not on file   Highest education level: Not on file  Occupational History   Occupation: RN  Tobacco Use   Smoking status: Never    Passive exposure: Never   Smokeless tobacco: Never  Vaping Use   Vaping Use: Never used  Substance and Sexual Activity   Alcohol use: No   Drug use: No   Sexual activity: Not on file  Other Topics Concern   Not on file  Social History Narrative   Tobacco use, amount per day now: 0   Past tobacco use, amount per day: 0   How many years did you use tobacco: 0   Alcohol use (drinks per week): 0   Diet: Heart Healthy/DASH Diet/ Lean and Clean type foods.   Do you drink/eat things with caffeine: 1 glass of Tea in the morning.   Marital status: Married                                 What year were you married? 1987   Do you live in a house, apartment, assisted living, condo, trailer, etc.? House   Is it one or more stories? Yes   How many persons live in your home? 2 ( Husband/ Self )   Do you have pets in your home?( please list) Not  currently, Dog Deceased.    Current or past profession: PhD/RN   Do you exercise?    Yes                              Type and how often? 6 Days a week.   Do you have a living will? Yes   Do you have a DNR form?   No  If not, do you want to discuss one? Yes   Do you have signed POA/HPOA forms? Yes                       If so, please bring to you appointment   Social Determinants of Health   Financial Resource Strain: Not on file  Food Insecurity: Not on file  Transportation Needs: Not on file  Physical Activity: Not on file  Stress: Not on file  Social Connections: Not on file  Intimate Partner Violence: Not on file    Family History  Problem Relation Age of Onset   Stroke Mother    Heart disease Mother    Heart disease Father    Diabetes Sister    Heart disease Sister    Diabetes Sister    Heart attack Brother 84   Diabetes Brother    Heart disease Brother    Sudden death Neg Hx    Hypertension Neg Hx    Hyperlipidemia Neg Hx    Breast cancer Neg Hx    Colon cancer Neg Hx    Esophageal cancer Neg Hx    Rectal cancer Neg Hx    Stomach cancer Neg Hx     Review of Systems:  As stated in the HPI and otherwise negative.   BP 112/78   Pulse (!) 59   Ht 5\' 10"  (1.778 m)   Wt 67.3 kg   LMP 08/14/2012   SpO2 99%   BMI 21.29 kg/m   Physical Examination: General: Well developed, well nourished, NAD  HEENT: OP clear, mucus membranes moist  SKIN: warm, dry. No rashes. Neuro: No focal deficits  Musculoskeletal: Muscle strength 5/5 all ext  Psychiatric: Mood and affect normal  Neck: No JVD, no carotid bruits, no thyromegaly, no lymphadenopathy.  Lungs:Clear bilaterally, no wheezes, rhonci, crackles Cardiovascular: Regular rate and rhythm. No murmurs, gallops or rubs. Abdomen:Soft. Bowel sounds present. Non-tender.  Extremities: No lower extremity edema. Pulses are 2 + in the bilateral DP/PT.  EKG:  EKG is not ordered today. The ekg  ordered today demonstrates  Echo 03/09/22:  1. Left ventricular ejection fraction, by estimation, is 60 to 65%. The  left ventricle has normal function. The left ventricle has no regional  wall motion abnormalities. Left ventricular diastolic parameters were  normal.   2. Right ventricular systolic function is normal. The right ventricular  size is normal.   3. Prolapse of posterior leaflet Degree of MR not well characterized on  color flow but does not appear severe Can consider TEE to further evaluate  if clinically indicated . The mitral valve is myxomatous. Mild to moderate  mitral valve regurgitation. No  evidence of mitral stenosis.   4. The aortic valve is tricuspid. Aortic valve regurgitation is not  visualized. No aortic stenosis is present.   5. The inferior vena cava is normal in size with greater than 50%  respiratory variability, suggesting right atrial pressure of 3 mmHg.   Recent Labs: 12/13/2021: TSH 1.55 05/07/2022: ALT 19; BUN 18; Creat 0.56; Hemoglobin 15.0; Platelets 262; Potassium 4.1; Sodium 138   Lipid Panel    Component Value Date/Time   CHOL 154 05/07/2022 0000   TRIG 50 05/07/2022 0000   HDL 65 05/07/2022 0000   CHOLHDL 2.4 05/07/2022 0000   LDLCALC 77 05/07/2022 0000     Wt Readings from Last 3 Encounters:  06/29/22 67.3 kg  06/27/22 66.2 kg  06/22/22 66.2 kg  Assessment and Plan:   1. Mitral regurgitation: Moderate MR by surface echo. She has no other reason to have such profound dyspnea. She is thin and active with no known lung disease. We have discussed arranging a TEE to better assess the severity of her MR. As above, recent EGD with no evidence of esophageal stricture but given mucosal changes seen on EGD and symptoms of dysphagia, esophageal dilatation was performed.  She continues to have some dysphagia post EGD. She wishes to delay planning for TEE right now. I agree with this. I will see her back in 3 months and we will re-evaluate.   2.  Dyspnea on exertion: Echo as above. Her dyspnea may be related to her MR. Will plan a TEE once GI issues resolved.   3. CAD without angina: Mild CAD on coronary CTA in January 2024. Continue statin. She stopped ASA as it has caused her to have significant bruising  Labs/ tests ordered today include:  No orders of the defined types were placed in this encounter.  Disposition:   F/U with me following her TEE  Signed, Verne Carrow, MD, [facility] 06/29/2022 2:53 PM    [facility] [address] [place], [place], [place]  [address] Phone: [phone]; Fax: [phone]

## 2022-06-29 NOTE — Telephone Encounter (Signed)
Last Prolia inj 06/27/22 Next Prolia inj due 12/29/22

## 2022-06-29 NOTE — Patient Instructions (Addendum)
Medication Instructions:  No changes *If you need a refill on your cardiac medications before your next appointment, please call your pharmacy*   Lab Work: none If you have labs (blood work) drawn today and your tests are completely normal, you will receive your results only by: MyChart Message (if you have MyChart) OR A paper copy in the mail If you have any lab test that is abnormal or we need to change your treatment, we will call you to review the results.   Testing/Procedures: none   Follow-Up: At [facility], you and your health needs are our priority.  As part of our continuing mission to provide you with exceptional heart care, we have created designated Provider Care Teams.  These Care Teams include your primary Cardiologist (physician) and Advanced Practice Providers (APPs -  Physician Assistants and Nurse Practitioners) who all work together to provide you with the care you need, when you need it.  Your next appointment:   3 month(s)  Provider:   Christopher McAlhany, MD      

## 2022-07-18 ENCOUNTER — Telehealth: Payer: Self-pay | Admitting: Gastroenterology

## 2022-07-18 DIAGNOSIS — R110 Nausea: Secondary | ICD-10-CM

## 2022-07-18 DIAGNOSIS — K638219 Small intestinal bacterial overgrowth, unspecified: Secondary | ICD-10-CM

## 2022-07-18 NOTE — Telephone Encounter (Signed)
Results review from the [facility]ero [facility] breath testing which were consistent with SIBO, with increased hydrogen at 43 ppm (normal is <20 ppm).  Plan for the following:  - Rifaximin 550 mg p.o. 3 times daily x 14 days, RF 1 - Start probiotic and continue at least 4 weeks beyond completion of rifaximin course - Sometimes patients will have partial response and require a second course of rifaximin

## 2022-07-19 ENCOUNTER — Other Ambulatory Visit (HOSPITAL_COMMUNITY): Payer: Self-pay

## 2022-07-19 MED ORDER — RIFAXIMIN 550 MG PO TABS: 550.0000 mg | ORAL_TABLET | Freq: Three times a day (TID) | ORAL | 1 refills | Status: DC

## 2022-07-19 MED ORDER — ALIGN 4 MG PO CAPS
1.0000 | ORAL_CAPSULE | Freq: Every day | ORAL | 0 refills | Status: DC
  Filled 2022-07-19: qty 28, 28d supply, fill #0
  Filled 2022-07-19: qty 90, 90d supply, fill #0

## 2022-07-19 NOTE — Addendum Note (Signed)
Addended by: Coletta Memos on: 07/19/2022 09:23 AM   Modules accepted: Orders

## 2022-07-19 NOTE — Telephone Encounter (Signed)
Spoke with patient. Patient made aware of her breath test result and recommendations. Detailed MyChart message sent. Rifaximin sent to [facility].

## 2022-07-20 ENCOUNTER — Other Ambulatory Visit: Payer: Self-pay

## 2022-07-20 ENCOUNTER — Other Ambulatory Visit (HOSPITAL_COMMUNITY): Payer: Self-pay

## 2022-07-25 ENCOUNTER — Encounter: Payer: Self-pay | Admitting: Gastroenterology

## 2022-08-02 ENCOUNTER — Other Ambulatory Visit: Payer: Self-pay

## 2022-08-02 ENCOUNTER — Encounter: Payer: 59 | Attending: Psychology | Admitting: Psychology

## 2022-08-02 ENCOUNTER — Other Ambulatory Visit (HOSPITAL_COMMUNITY): Payer: Self-pay

## 2022-08-02 DIAGNOSIS — R41840 Attention and concentration deficit: Secondary | ICD-10-CM | POA: Diagnosis not present

## 2022-08-02 DIAGNOSIS — G4709 Other insomnia: Secondary | ICD-10-CM

## 2022-08-02 DIAGNOSIS — R4189 Other symptoms and signs involving cognitive functions and awareness: Secondary | ICD-10-CM | POA: Insufficient documentation

## 2022-08-02 DIAGNOSIS — R413 Other amnesia: Secondary | ICD-10-CM | POA: Diagnosis not present

## 2022-08-02 DIAGNOSIS — R29818 Other symptoms and signs involving the nervous system: Secondary | ICD-10-CM | POA: Insufficient documentation

## 2022-08-02 MED ORDER — RIFAXIMIN 550 MG PO TABS
550.0000 mg | ORAL_TABLET | Freq: Three times a day (TID) | ORAL | 1 refills | Status: AC
  Filled 2022-08-02: qty 42, 14d supply, fill #0

## 2022-08-02 NOTE — Telephone Encounter (Signed)
Received fax from [facility] stating that PA has been approved and advised prescription needs to send to the local pharmacy. New Rx sent to [facility] outpatient pharmacy. Confirmed with pharmacy that patient doesn't have any copay. LVM detailed message to patient to pick up Rifaximin from [facility] pharmacy.

## 2022-08-03 ENCOUNTER — Other Ambulatory Visit (HOSPITAL_COMMUNITY): Payer: Self-pay

## 2022-08-04 LAB — AMB RESULTS CONSOLE CBG: Glucose: 99

## 2022-08-08 ENCOUNTER — Encounter (HOSPITAL_BASED_OUTPATIENT_CLINIC_OR_DEPARTMENT_OTHER): Payer: 59

## 2022-08-08 DIAGNOSIS — R29818 Other symptoms and signs involving the nervous system: Secondary | ICD-10-CM

## 2022-08-08 DIAGNOSIS — R4189 Other symptoms and signs involving cognitive functions and awareness: Secondary | ICD-10-CM

## 2022-08-15 NOTE — Progress Notes (Signed)
Behavioral Observations: The patient appeared well-groomed and appropriately dressed. Her manners were polite and appropriate to the situation. The patient's attitude towards testing was positive and her effort was good.   Neuropsychology Note  GENOVEVA SINGLETON completed 135 minutes of neuropsychological testing with technician, Staci Acosta, BA, under the supervision of Arley Phenix, PsyD., Clinical Neuropsychologist. The patient did not appear overtly distressed by the testing session, per behavioral observation or via self-report to the technician. Rest breaks were offered.   Clinical Decision Making: In considering the patient's current level of functioning, level of presumed impairment, nature of symptoms, emotional and behavioral responses during clinical interview, level of literacy, and observed level of motivation/effort, a battery of tests was selected by Dr. Kieth Brightly during initial consultation on 08/02/2022. This was communicated to the technician. Communication between the neuropsychologist and technician was ongoing throughout the testing session and changes were made as deemed necessary based on patient performance on testing, technician observations and additional pertinent factors such as those listed above.  Tests Administered: Wechsler Adult Intelligence Scale, 4th Edition (WAIS-IV) Wechsler Memory Scale, 4th Edition (WMS-IV); Adult Battery   Results: Composite Score Summary  Scale Sum of Scaled Scores Composite Score Percentile Rank 95% Conf. Interval Qualitative Description  Verbal Comprehension 27 VCI 95 37 90-101 Average  Perceptual Reasoning 24 PRI 88 21 82-95 Low Average  Working Memory 23 WMI 108 70 101-114 Average  Processing Speed 26 PSI 117 87 107-124 High Average  Full Scale 100 FSIQ 100 50 96-104 Average  General Ability 51 GAI 91 27 86-96 Average   Verbal Comprehension Subtests Summary  Subtest Raw Score Scaled Score Percentile Rank Reference  Group Scaled Score SEM  Similarities 25 10 50 10 1.08  Vocabulary 44 11 63 13 0.73  Information 7 6 9 6  0.67  The scaled scores in the Reference Group Scaled Score column are based on the performance of examinees aged 20:0-34:11 (i.e., the reference group). See Chapter 6 of the WAIS-IV Technical and Interpretive Manual for more information.  Perceptual Reasoning Subtests Summary  Subtest Raw Score Scaled Score Percentile Rank Reference Group Scaled Score SEM  Block Design 28 8 25 6  1.04  Matrix Reasoning 14 9 37 7 0.95  Visual Puzzles 8 7 16 6  0.99   Working Librarian, academic Raw Score Scaled Score Percentile Rank Reference Group Scaled Score SEM  Digit Span 27 10 50 9 0.85  Arithmetic 18 13 84 13 1.04   Processing Speed Subtests Summary  Subtest Raw Score Scaled Score Percentile Rank Reference Group Scaled Score SEM  Symbol Search 38 14 91 12 1.31  Coding 69 12 75 9 0.99    WMS-IV:  Index Score Summary  Index Sum of Scaled Scores Index Score Percentile Rank 95% Confidence Interval Qualitative Descriptor  Auditory Memory (AMI) 42 102 55 96-108 Average  Visual Memory (VMI) 22 73 4 68-80 Borderline  Visual Working Memory (VWMI) 21 103 58 96-110 Average  Immediate Memory (IMI) 28 80 9 75-87 Low Average  Delayed Memory (DMI) 36 93 32 87-100 Average   Primary Subtest Scaled Score Summary  Subtest Domain Raw Score Scaled Score Percentile Rank  Logical Memory I AM 18 7 16   Logical Memory II AM 23 11 63  Verbal Paired Associates I AM 33 11 63  Verbal Paired Associates II AM 12 13 84  Designs I VM 19 1 0.1  Designs II VM 0 1 0.1  Visual Reproduction I VM 31 9 37  Visual Reproduction II VM 26 11 63   Auditory Memory Process Score Summary  Process Score Raw Score Scaled Score Percentile Rank Cumulative Percentage (Base Rate)  LM II Recognition 26 - - >75%  VPA II Recognition 38 - - 26-50%   Visual Memory Process Score Summary  Process Score Raw Score Scaled  Score Percentile Rank Cumulative Percentage (Base Rate)  DE I Content 9 1 0.1 -  DE I Spatial 4 1 0.1 -  DE II Content 0 1 0.1 -  DE II Spatial 0 1 0.1 -  DE II Recognition 9 - - 3-9%  VR II Recognition 6 - - 51-75%    ABILITY-MEMORY ANALYSIS  Ability Score:  VCI: 95 Date of Testing:  WAIS-IV; WMS-IV 2022/08/08  Predicted Difference Method   Index Predicted WMS-IV Index Score Actual WMS-IV Index Score Difference Critical Value  Significant Difference Y/N Base Rate  Auditory Memory 97 102 -5 9.00 N   Visual Memory 98 73 25 8.38 Y 3-4%  Visual Working Memory 97 103 -6 10.86 N   Immediate Memory 97 80 17 10.12 Y 5-10%  Delayed Memory 97 93 4 9.95 N   Statistical significance (critical value) at the .01 level.    Feedback to Patient: SHERLEY MCKENNEY will return on 06/17/2023 for an interactive feedback session with Dr. Kieth Brightly at which time her test performances, clinical impressions and treatment recommendations will be reviewed in detail. The patient understands she can contact our office should she require our assistance before this time.  135 minutes spent face-to-face with patient administering standardized tests, 30 minutes spent scoring Radiographer, therapeutic). [CPT [id number], 96139]  Full report to follow.

## 2022-08-28 ENCOUNTER — Other Ambulatory Visit: Payer: Self-pay | Admitting: Family

## 2022-08-28 DIAGNOSIS — Z1231 Encounter for screening mammogram for malignant neoplasm of breast: Secondary | ICD-10-CM

## 2022-08-29 NOTE — Progress Notes (Signed)
[name] [address] [place] [address] Phone: [phone] Subjective:   [name], am serving as a scribe for Dr. Antoine Primas.  I'm seeing this patient by the request  of:  [name], [name], NP  CC: Back and neck pain follow-up  [id number]  Tara [name] is a 62 y.o. female coming in with complaint of back and neck pain. OMT on 06/22/2022. Patient states same per usual. No new concerns.  Medications patient has been prescribed:   Taking:         Reviewed prior external information including notes and imaging from previsou exam, outside providers and external EMR if available.   As well as notes that were available from care everywhere and other healthcare systems.  Past medical history, social, surgical and family history all reviewed in electronic medical record.  No pertanent information unless stated regarding to the chief complaint.   Past Medical History:  Diagnosis Date   Anemia    Arthritis    Hyperlipidemia    Insomnia    Mitral valve prolapse    PONV (postoperative nausea and vomiting)     Allergies  Allergen Reactions   Codeine Nausea And Vomiting   Hydrocodone Nausea And Vomiting   Triple Antibiotic Pain Relief [Neomy-Bacit-Polymyx-Pramoxine] Other (See Comments)    Burn   Triple Antibiotic W/Hydrocortisone  [Bacitra-Neomycin-Polymyxin-Hc]    Contrast Media [Iodinated Contrast Media] Hives    After CT scan with contrast, pt had MRI without contrast. Pt informed MRI Tech that she had a rash on her legs. MRI Tech spoke with Radiologist after MRI was completed. Per Rad, since patient was here x 30 minutes after CT contrast and was doing fine, with rash fading, pt was allowed to leave facility. Alert and oriented. jmh     Review of Systems:  No headache, visual changes, nausea, vomiting, diarrhea, constipation, dizziness, abdominal pain, skin rash, fevers, chills, night sweats, weight loss, swollen  lymph nodes, body aches, joint swelling, chest pain, shortness of breath, mood changes. POSITIVE muscle aches  Objective  Blood pressure 98/60, pulse 67, height 5\' 10"  (1.778 m), weight 148 lb (67.1 kg), last menstrual period 08/14/2012, SpO2 98%.   General: No apparent distress alert and oriented x3 mood and affect normal, dressed appropriately.  HEENT: Pupils equal, extraocular movements intact  Respiratory: Patient's speak in full sentences and does not appear short of breath  Cardiovascular: No lower extremity edema, non tender, no erythema  Neck exam does have significant loss of lordosis noted.  Some tenderness to palpation in the paraspinal musculature.  Patient does have tightness noted more than what would be anticipated in multiple areas.  Does have arthritic changes noted.  Osteopathic findings  C2 flexed rotated and side bent right C6 flexed rotated and side bent left T3 extended rotated and side bent right inhaled rib T9 extended rotated and side bent left L2 flexed rotated and side bent right L3 flexed rotated and side bent left L4 flexed rotated and side bent right L5 flexed rotated and side bent left Sacrum right on right       Assessment and Plan:  Polyarthralgia Still significantly difficult to figure out what is causing most of her discomfort and pain.  Attempted osteopathic manipulation.  Continues to have osteoporosis as well that is quite severe.  We discussed with patient at great length today about different medications.  With patient having the small bowel overgrowth chronic abdominal pain, known arthritic changes I  would like to see how patient responds to Cymbalta.  20 mg sent in.  Warned of potential side effects.  Hopeful that this will make improvement.  I think if this does fail I would like to consider the possibility of Effexor.  Follow-up with me again in 6 to 8 weeks.  Total time with patient today 33 minutes  DDD (degenerative disc disease),  cervical Known degenerative disc disease.  We discussed which activities to do and which ones to avoid.      Nonallopathic problems  Decision today to treat with OMT was based on Physical Exam  After verbal consent patient was treated with HVLA, ME, FPR techniques in cervical, rib, thoracic, lumbar, and sacral  areas  Patient tolerated the procedure well with improvement in symptoms  Patient given exercises, stretches and lifestyle modifications  See medications in patient instructions if given  Patient will follow up in 4-8 weeks    The above documentation has been reviewed and is accurate and complete Judi Saa, DO          Note: This dictation was prepared with Dragon dictation along with smaller phrase technology. Any transcriptional errors that result from this process are unintentional.

## 2022-08-31 ENCOUNTER — Ambulatory Visit (INDEPENDENT_AMBULATORY_CARE_PROVIDER_SITE_OTHER): Payer: 59 | Admitting: Family Medicine

## 2022-08-31 ENCOUNTER — Other Ambulatory Visit (HOSPITAL_COMMUNITY): Payer: Self-pay

## 2022-08-31 ENCOUNTER — Other Ambulatory Visit (HOSPITAL_BASED_OUTPATIENT_CLINIC_OR_DEPARTMENT_OTHER): Payer: Self-pay

## 2022-08-31 ENCOUNTER — Encounter: Payer: Self-pay | Admitting: Family Medicine

## 2022-08-31 VITALS — BP 98/60 | HR 67 | Ht 70.0 in | Wt 148.0 lb

## 2022-08-31 DIAGNOSIS — M9904 Segmental and somatic dysfunction of sacral region: Secondary | ICD-10-CM

## 2022-08-31 DIAGNOSIS — M2550 Pain in unspecified joint: Secondary | ICD-10-CM | POA: Diagnosis not present

## 2022-08-31 DIAGNOSIS — M9903 Segmental and somatic dysfunction of lumbar region: Secondary | ICD-10-CM | POA: Diagnosis not present

## 2022-08-31 DIAGNOSIS — M9908 Segmental and somatic dysfunction of rib cage: Secondary | ICD-10-CM

## 2022-08-31 DIAGNOSIS — M5030 Other cervical disc degeneration, unspecified cervical region: Secondary | ICD-10-CM

## 2022-08-31 DIAGNOSIS — M9902 Segmental and somatic dysfunction of thoracic region: Secondary | ICD-10-CM | POA: Diagnosis not present

## 2022-08-31 DIAGNOSIS — M9901 Segmental and somatic dysfunction of cervical region: Secondary | ICD-10-CM | POA: Diagnosis not present

## 2022-08-31 MED ORDER — DULOXETINE HCL 20 MG PO CPEP
20.0000 mg | ORAL_CAPSULE | Freq: Every day | ORAL | 0 refills | Status: DC
  Filled 2022-08-31: qty 30, 30d supply, fill #0

## 2022-08-31 NOTE — Assessment & Plan Note (Addendum)
Known degenerative disc disease.  We discussed which activities to do and which ones to avoid.  We discussed icing regimen and home exercises.  Starting Cymbalta 20 mg.  Follow-up again in 6 to 8 weeks.

## 2022-08-31 NOTE — Patient Instructions (Signed)
Cymbalta 20mg  Continue to stay active where you can I'll see if I can figure any thing else out in the next week or two but send message in 2 weeks See you again in 6-8 weeks

## 2022-08-31 NOTE — Assessment & Plan Note (Signed)
Still significantly difficult to figure out what is causing most of her discomfort and pain.  Attempted osteopathic manipulation.  Continues to have osteoporosis as well that is quite severe.  We discussed with patient at great length today about different medications.  With patient having the small bowel overgrowth chronic abdominal pain, known arthritic changes I would like to see how patient responds to Cymbalta.  20 mg sent in.  Warned of potential side effects.  Hopeful that this will make improvement.  I think if this does fail I would like to consider the possibility of Effexor.  Follow-up with me again in 6 to 8 weeks.  Total time with patient today 33 minutes

## 2022-09-06 ENCOUNTER — Other Ambulatory Visit (HOSPITAL_COMMUNITY): Payer: Self-pay

## 2022-09-07 ENCOUNTER — Encounter: Payer: Self-pay | Admitting: Family Medicine

## 2022-09-07 ENCOUNTER — Ambulatory Visit (INDEPENDENT_AMBULATORY_CARE_PROVIDER_SITE_OTHER): Payer: 59

## 2022-09-07 ENCOUNTER — Ambulatory Visit (INDEPENDENT_AMBULATORY_CARE_PROVIDER_SITE_OTHER): Payer: 59 | Admitting: Podiatry

## 2022-09-07 DIAGNOSIS — M2041 Other hammer toe(s) (acquired), right foot: Secondary | ICD-10-CM

## 2022-09-07 DIAGNOSIS — M2042 Other hammer toe(s) (acquired), left foot: Secondary | ICD-10-CM | POA: Diagnosis not present

## 2022-09-07 DIAGNOSIS — M21611 Bunion of right foot: Secondary | ICD-10-CM | POA: Diagnosis not present

## 2022-09-07 DIAGNOSIS — M21612 Bunion of left foot: Secondary | ICD-10-CM | POA: Diagnosis not present

## 2022-09-07 DIAGNOSIS — L989 Disorder of the skin and subcutaneous tissue, unspecified: Secondary | ICD-10-CM

## 2022-09-07 NOTE — Patient Instructions (Addendum)
For dress inserts look at [employer] Insert[employer]  [employer] and [place]

## 2022-09-07 NOTE — Progress Notes (Signed)
Subjective: Chief Complaint  Patient presents with   Bunions    Pt states she has been having bunion pain for a while now the right foot is worse than the left she states the left hurts the most on top because of the ligaments. Pt also says her toes hurt because of her arthritis     62 year old female presents the office today bilateral foot discomfort, pain in toes as well as for questions about her toenails.  No recent injuries.  She brought in shoes and inserts me to look at.  She has been using AmLactin on some dry skin and as well as Saran wrap.  Lesions come back.   Objective: AAO x3, NAD DP/PT pulses palpable bilaterally, CRT less than 3 seconds Hammertoes are present as well as mild bunion.  Hyperkeratotic lesions bilaterally without any underlying ulceration drainage or signs of infection.  Nails are mildly hypertrophic, dystrophic with yellow-brown discoloration.  Some clear proximal nail fold.  No edema, erythema or signs of infection.  No pain with calf compression, swelling, warmth, erythema  Assessment: Hyperkeratotic lesions, hammertoe/bunion deformity, onychomycosis  Plan: -All treatment options discussed with the patient including all alternatives, risks, complications.  -X-rays obtained reviewed bilaterally.  3 views of the feet were obtained.  Bunion is present with elevation pressure.  No evidence of acute fracture. -Discussed miracle foot cream for her skin. -We discussed different types of inserts particularly for shoes with heels that she can wear.  We discussed over-the-counter power step inserts for dress shoes. -Continue medication for the toenails -Toe spacers to help with the lesser digits. -Patient encouraged to call the office with any questions, concerns, change in symptoms.   Vivi Barrack DPM

## 2022-09-17 ENCOUNTER — Ambulatory Visit: Payer: 59 | Admitting: Cardiovascular Disease

## 2022-09-18 ENCOUNTER — Other Ambulatory Visit: Payer: Self-pay | Admitting: Podiatry

## 2022-09-18 ENCOUNTER — Other Ambulatory Visit (HOSPITAL_COMMUNITY): Payer: Self-pay

## 2022-09-18 ENCOUNTER — Other Ambulatory Visit: Payer: Self-pay

## 2022-09-18 DIAGNOSIS — M2041 Other hammer toe(s) (acquired), right foot: Secondary | ICD-10-CM

## 2022-09-18 DIAGNOSIS — L989 Disorder of the skin and subcutaneous tissue, unspecified: Secondary | ICD-10-CM

## 2022-09-18 DIAGNOSIS — M21611 Bunion of right foot: Secondary | ICD-10-CM

## 2022-09-18 MED ORDER — DULOXETINE HCL 20 MG PO CPEP
20.0000 mg | ORAL_CAPSULE | Freq: Every day | ORAL | 0 refills | Status: DC
  Filled 2022-09-18 – 2022-09-26 (×3): qty 90, 90d supply, fill #0

## 2022-09-20 ENCOUNTER — Encounter (HOSPITAL_COMMUNITY): Payer: Self-pay

## 2022-09-21 ENCOUNTER — Other Ambulatory Visit (HOSPITAL_COMMUNITY): Payer: Self-pay

## 2022-09-22 ENCOUNTER — Other Ambulatory Visit (HOSPITAL_COMMUNITY): Payer: Self-pay

## 2022-09-24 ENCOUNTER — Telehealth: Payer: Self-pay | Admitting: Family

## 2022-09-24 NOTE — Telephone Encounter (Signed)
[facility] preoperative clearance form received.Please call patient to schedule a Pre-op appointment in the office.will need EKG to be done if not already done by cardiologist.

## 2022-09-24 NOTE — Telephone Encounter (Signed)
Noted  

## 2022-09-24 NOTE — Telephone Encounter (Signed)
Called and spoke with patient and she stated that the surgery is scheduled out till December but it is looking like she is going to have to reschedule to later due to work conflict.   Patient stated that she will call back once she has a definite date set for the surgery.

## 2022-09-26 ENCOUNTER — Other Ambulatory Visit (HOSPITAL_COMMUNITY): Payer: Self-pay

## 2022-09-27 ENCOUNTER — Other Ambulatory Visit: Payer: Self-pay

## 2022-10-02 NOTE — Progress Notes (Unsigned)
No chief complaint on file.  History of Present Illness: 62 yo female with history of anemia, arthritis, hyperlipidemia and mitral valve prolapse/mitral regurgitation who is here today for cardiac follow up. I saw her as a new patient for the evaluation of her mitral valve disease in October 2023. Echo in 2012 with normal LV function and mild to moderate mitral regurgitation. She was lost to follow up in our office from 2012 to 2023. When I saw her in October 2023 she described severe dyspnea on exertion. No chest pain. She had edema in both ankles at the end of the day. Echo January 2024 with LVEF=60-65%, normal RV function. Moderate mitral regurgitation but unable to fully estimate degree of severity of the MR. Coronary CTA 03/02/22 with minimal plaque in the LAD and RCA, calcium score of zero. She was started on a statin and ASA. I saw her in the office in February 2024 and she remained dyspneic. We discussed arranging a TEE but delayed this until she had a GI workup. She had upper endoscopy performed on 06/08/22 and was found to have mucosal changes with longitudinal furrows but no strictures. Procedure note indicates that given the endoscopic appearance and symptoms of dysphagia, esophageal dilatation was performed.   She is here today for follow up. The patient denies any chest pain, dyspnea, palpitations, lower extremity edema, orthopnea, PND, dizziness, near syncope or syncope.   Primary Care Physician: Caesar Bookman, NP   Past Medical History:  Diagnosis Date   Anemia    Arthritis    Hyperlipidemia    Insomnia    Mitral valve prolapse    PONV (postoperative nausea and vomiting)     Past Surgical History:  Procedure Laterality Date   AUGMENTATION MAMMAPLASTY Bilateral    Dec 2019   BREAST BIOPSY Right    axillary node benign   KNEE SURGERY  1988   right   SHOULDER ARTHROSCOPY WITH LABRAL REPAIR Right 08/28/2012   Procedure: RIGHT SHOULDER ARTHROSCOPY WITH LABRAL DEBRIDEMENT,  ASPIRATION AND DECOMPRESSION OF PARALABRAL CYST;  Surgeon: Senaida Lange, MD;  Location: [facility] OR;  Service: Orthopedics;  Laterality: Right;   TOTAL SHOULDER ARTHROPLASTY Right 03/24/2020   Procedure: RIGHT REVERSE SHOULDER ARTHROPLASTY;  Surgeon: Francena Hanly, MD;  Location: [facility]S;  Service: Orthopedics;  Laterality: Right;    TOTAL SHOULDER ARTHROPLASTY Left 02/23/2021   Procedure: TOTAL SHOULDER ARTHROPLASTY;  Surgeon: Francena Hanly, MD;  Location: [facility];  Service: Orthopedics;  Laterality: Left;    Current Outpatient Medications  Medication Sig Dispense Refill   AMBULATORY NON FORMULARY MEDICATION Medication Name: Mindful Evening Cocoa Mix (Melatonin 3 mg, Theanine 5 mg, GABA 100 mg)     aspirin EC 81 MG tablet Take 1 tablet (81 mg total) by mouth daily. Swallow whole. 90 tablet 3   CALCIUM CITRATE PO Take 600 mg by mouth 2 (two) times daily. Number varies each day based on calcium ingested in food     cholecalciferol (VITAMIN D3) 25 MCG (1000 UNIT) tablet Take 1,000 Units by mouth daily.     denosumab (PROLIA) 60 MG/ML SOSY injection Inject 60 mg into the skin every 6 (six) months.     DULoxetine (CYMBALTA) 20 MG capsule Take 1 capsule (20 mg total) by mouth daily. 90 capsule 0   Efinaconazole 10 % SOLN Apply 1 drop topically daily. (Patient taking differently: Apply 1 drop topically daily as needed (Toe Nail).) 4 mL 11   hydroquinone 4 % cream Apply 1  Application topically to affected areas 2 (two) times daily. 56.7 g 3   Multiple Vitamin (MULTIVITAMIN WITH MINERALS) TABS Take 1 tablet by mouth daily.     Omega-3 Fatty Acids (FISH OIL) 1000 MG CAPS Take 1 capsule (1,000 mg total) by mouth daily. 30 capsule 0   Psyllium 100 % POWD      rosuvastatin (CRESTOR) 5 MG tablet Take 1 tablet (5 mg total) by mouth daily. 90 tablet 3   tretinoin (ALTRALIN) 0.05 % gel Apply 1 application topically at bedtime. 45 g 3   No current facility-administered medications for this visit.    Allergies   Allergen Reactions   Codeine Nausea And Vomiting   Hydrocodone Nausea And Vomiting   Triple Antibiotic Pain Relief [Neomy-Bacit-Polymyx-Pramoxine] Other (See Comments)    Burn   Triple Antibiotic W/Hydrocortisone  [Bacitra-Neomycin-Polymyxin-Hc]    Contrast Media [Iodinated Contrast Media] Hives    After CT scan with contrast, pt had MRI without contrast. Pt informed MRI Tech that she had a rash on her legs. MRI Tech spoke with Radiologist after MRI was completed. Per Rad, since patient was here x 30 minutes after CT contrast and was doing fine, with rash fading, pt was allowed to leave facility. Alert and oriented. jmh    Social History   Socioeconomic History   Marital status: Married    Spouse name: Not on file   Number of children: 1   Years of education: Not on file   Highest education level: Not on file  Occupational History   Occupation: RN  Tobacco Use   Smoking status: Never    Passive exposure: Never   Smokeless tobacco: Never  Vaping Use   Vaping status: Never Used  Substance and Sexual Activity   Alcohol use: No   Drug use: No   Sexual activity: Not on file  Other Topics Concern   Not on file  Social History Narrative   Tobacco use, amount per day now: 0   Past tobacco use, amount per day: 0   How many years did you use tobacco: 0   Alcohol use (drinks per week): 0   Diet: Heart Healthy/DASH Diet/ Lean and Clean type foods.   Do you drink/eat things with caffeine: 1 glass of Tea in the morning.   Marital status: Married                                 What year were you married? 1987   Do you live in a house, apartment, assisted living, condo, trailer, etc.? House   Is it one or more stories? Yes   How many persons live in your home? 2 ( Husband/ Self )   Do you have pets in your home?( please list) Not currently, Dog Deceased.    Current or past profession: PhD/RN   Do you exercise?    Yes                              Type and how often? 6 Days a week.    Do you have a living will? Yes   Do you have a DNR form?   No                               If not, do you want to discuss  one? Yes   Do you have signed POA/HPOA forms? Yes                       If so, please bring to you appointment   Social Determinants of Health   Financial Resource Strain: Not on file  Food Insecurity: Not on file  Transportation Needs: Not on file  Physical Activity: Not on file  Stress: Not on file  Social Connections: Not on file  Intimate Partner Violence: Not on file    Family History  Problem Relation Age of Onset   Stroke Mother    Heart disease Mother    Heart disease Father    Diabetes Sister    Heart disease Sister    Diabetes Sister    Heart attack Brother 52   Diabetes Brother    Heart disease Brother    Sudden death Neg Hx    Hypertension Neg Hx    Hyperlipidemia Neg Hx    Breast cancer Neg Hx    Colon cancer Neg Hx    Esophageal cancer Neg Hx    Rectal cancer Neg Hx    Stomach cancer Neg Hx     Review of Systems:  As stated in the HPI and otherwise negative.   LMP 08/14/2012   Physical Examination: General: Well developed, well nourished, NAD  HEENT: OP clear, mucus membranes moist  SKIN: warm, dry. No rashes. Neuro: No focal deficits  Musculoskeletal: Muscle strength 5/5 all ext  Psychiatric: Mood and affect normal  Neck: No JVD, no carotid bruits, no thyromegaly, no lymphadenopathy.  Lungs:Clear bilaterally, no wheezes, rhonci, crackles Cardiovascular: Regular rate and rhythm. *** Systolic murmur.  Abdomen:Soft. Bowel sounds present. Non-tender.  Extremities: No lower extremity edema. Pulses are 2 + in the bilateral DP/PT.  EKG:  EKG is not *** ordered today. The ekg ordered today demonstrates  Echo 03/09/22:  1. Left ventricular ejection fraction, by estimation, is 60 to 65%. The  left ventricle has normal function. The left ventricle has no regional  wall motion abnormalities. Left ventricular diastolic parameters were   normal.   2. Right ventricular systolic function is normal. The right ventricular  size is normal.   3. Prolapse of posterior leaflet Degree of MR not well characterized on  color flow but does not appear severe Can consider TEE to further evaluate  if clinically indicated . The mitral valve is myxomatous. Mild to moderate  mitral valve regurgitation. No  evidence of mitral stenosis.   4. The aortic valve is tricuspid. Aortic valve regurgitation is not  visualized. No aortic stenosis is present.   5. The inferior vena cava is normal in size with greater than 50%  respiratory variability, suggesting right atrial pressure of 3 mmHg.   Recent Labs: 12/13/2021: TSH 1.55 05/07/2022: ALT 19; BUN 18; Creat 0.56; Hemoglobin 15.0; Platelets 262; Potassium 4.1; Sodium 138   Lipid Panel    Component Value Date/Time   CHOL 154 05/07/2022 0000   TRIG 50 05/07/2022 0000   HDL 65 05/07/2022 0000   CHOLHDL 2.4 05/07/2022 0000   LDLCALC 77 05/07/2022 0000     Wt Readings from Last 3 Encounters:  08/31/22 67.1 kg  06/29/22 67.3 kg  06/27/22 66.2 kg    Assessment and Plan:   1. Mitral regurgitation: Moderate MR by surface echo in January 2024. *** She continue to have dyspnea on exertion. We delayed her TEE when she was last seen in May 2024 since  she had just undergone esophageal dilatation. ***  2. Dyspnea on exertion: Echo as above. Her dyspnea may be related to her MR. Will plan a TEE now to better evaluate her mitral valve.   3. CAD without angina: Mild CAD on coronary CTA in January 2024. No chest pain. Will continue ASA and statin. *** She stopped ASA as it has caused her to have significant bruising  Labs/ tests ordered today include:  No orders of the defined types were placed in this encounter.  Disposition:   F/U with me following her TEE  Signed, Verne Carrow, MD, [facility] 10/02/2022 2:05 PM    [facility] [address] [place], [place], [place]   [address] Phone: [phone]; Fax: [phone]

## 2022-10-03 ENCOUNTER — Ambulatory Visit: Payer: 59 | Attending: Cardiovascular Disease | Admitting: Cardiovascular Disease

## 2022-10-03 ENCOUNTER — Encounter: Payer: Self-pay | Admitting: Cardiovascular Disease

## 2022-10-03 VITALS — BP 100/68 | HR 65 | Ht 70.0 in | Wt 144.4 lb

## 2022-10-03 DIAGNOSIS — I340 Nonrheumatic mitral (valve) insufficiency: Secondary | ICD-10-CM | POA: Diagnosis not present

## 2022-10-03 DIAGNOSIS — I2510 Atherosclerotic heart disease of native coronary artery without angina pectoris: Secondary | ICD-10-CM

## 2022-10-03 NOTE — Patient Instructions (Signed)
Medication Instructions:  No changes *If you need a refill on your cardiac medications before your next appointment, please call your pharmacy*   Lab Work: None   Testing/Procedures: ECHO DUE IN [facility] [place] Your physician has requested that you have an echocardiogram. Echocardiography is a painless test that uses sound waves to create images of your heart. It provides your doctor with information about the size and shape of your heart and how well your heart's chambers and valves are working. This procedure takes approximately one hour. There are no restrictions for this procedure. Please do NOT wear cologne, perfume, aftershave, or lotions (deodorant is allowed). Please arrive 15 minutes prior to your appointment time.   Follow-Up: At [facility], you and your health needs are our priority.  As part of our continuing mission to provide you with exceptional heart care, we have created designated Provider Care Teams.  These Care Teams include your primary Cardiologist (physician) and Advanced Practice Providers (APPs -  Physician Assistants and Nurse Practitioners) who all work together to provide you with the care you need, when you need it.   Your next appointment:   January 2025 -- AFTER NEXT ECHO

## 2022-10-12 ENCOUNTER — Ambulatory Visit: Admission: RE | Admit: 2022-10-12 | Payer: 59 | Source: Ambulatory Visit

## 2022-10-12 DIAGNOSIS — Z1231 Encounter for screening mammogram for malignant neoplasm of breast: Secondary | ICD-10-CM

## 2022-10-15 ENCOUNTER — Other Ambulatory Visit (HOSPITAL_COMMUNITY): Payer: Self-pay

## 2022-10-18 NOTE — Progress Notes (Unsigned)
[name] [address] [place] [address] Phone: [phone] Subjective:   [name], am serving as a scribe for Dr. Antoine Primas.  I'm seeing this patient by the request  of:  [name], [name], NP  CC: Continues to have multiple joint pain  QIO:[id number]  KOLETTE NEARING is a 62 y.o. female coming in with complaint of back and neck pain. OMT on 08/31/2022. Patient states that her hip flexor on R side adjustment was helpful for one day. Otherwise the same.   Medications patient has been prescribed: Cymbalta was prescribed July 19.  Taking:     Reviewing patient's chart patient is having a revision of her left shoulder in December 2024.    Reviewed prior external information including notes and imaging from previsou exam, outside providers and external EMR if available.   As well as notes that were available from care everywhere and other healthcare systems.  Past medical history, social, surgical and family history all reviewed in electronic medical record.  No pertanent information unless stated regarding to the chief complaint.   Past Medical History:  Diagnosis Date   Anemia    Arthritis    Hyperlipidemia    Insomnia    Mitral valve prolapse    PONV (postoperative nausea and vomiting)     Allergies  Allergen Reactions   Codeine Nausea And Vomiting   Hydrocodone Nausea And Vomiting   Triple Antibiotic Pain Relief [Neomy-Bacit-Polymyx-Pramoxine] Other (See Comments)    Burn   Triple Antibiotic W/Hydrocortisone  [Bacitra-Neomycin-Polymyxin-Hc]    Contrast Media [Iodinated Contrast Media] Hives    After CT scan with contrast, pt had MRI without contrast. Pt informed MRI Tech that she had a rash on her legs. MRI Tech spoke with Radiologist after MRI was completed. Per Rad, since patient was here x 30 minutes after CT contrast and was doing fine, with rash fading, pt was allowed to leave facility. Alert and oriented. jmh      Review of Systems:  No headache, visual changes, nausea, vomiting, diarrhea, constipation, dizziness, abdominal pain, skin rash, fevers, chills, night sweats, weight loss, swollen lymph nodes, body aches, joint swelling, chest pain, shortness of breath, mood changes. POSITIVE muscle aches  Objective  Blood pressure 102/84, pulse 60, height 5\' 10"  (1.778 m), weight 147 lb (66.7 kg), last menstrual period 08/14/2012, SpO2 98%.   General: No apparent distress alert and oriented x3 mood and affect normal, dressed appropriately.  HEENT: Pupils equal, extraocular movements intact  Respiratory: Patient's speak in full sentences and does not appear short of breath  Cardiovascular: No lower extremity edema, non tender, no erythema  continues to have difficulty overall.  Patient continues to have osteoarthritic changes and osteopenia neck exam does have some limited sidebending bilaterally.  Osteopathic findings  C2 flexed rotated and side bent right C6 flexed rotated and side bent left T3 extended rotated and side bent right inhaled rib T9 extended rotated and side bent left L2 flexed rotated and side bent right Sacrum right on right       Assessment and Plan:  No problem-specific Assessment & Plan notes found for this encounter.    Nonallopathic problems  Decision today to treat with OMT was based on Physical Exam  After verbal consent patient was treated with HVLA, ME, FPR techniques in cervical, rib, thoracic, lumbar, and sacral  areas  Patient tolerated the procedure well with improvement in symptoms  Patient given exercises, stretches and lifestyle modifications  See medications in patient instructions if given  Patient will follow up in 4-8 weeks     The above documentation has been reviewed and is accurate and complete Judi Saa, DO         Note: This dictation was prepared with Dragon dictation along with smaller phrase technology. Any transcriptional  errors that result from this process are unintentional.

## 2022-10-26 ENCOUNTER — Ambulatory Visit (INDEPENDENT_AMBULATORY_CARE_PROVIDER_SITE_OTHER): Payer: 59 | Admitting: Family Medicine

## 2022-10-26 VITALS — BP 102/84 | HR 60 | Ht 70.0 in | Wt 147.0 lb

## 2022-10-26 DIAGNOSIS — M9908 Segmental and somatic dysfunction of rib cage: Secondary | ICD-10-CM | POA: Diagnosis not present

## 2022-10-26 DIAGNOSIS — M9904 Segmental and somatic dysfunction of sacral region: Secondary | ICD-10-CM | POA: Diagnosis not present

## 2022-10-26 DIAGNOSIS — M9901 Segmental and somatic dysfunction of cervical region: Secondary | ICD-10-CM | POA: Diagnosis not present

## 2022-10-26 DIAGNOSIS — M5030 Other cervical disc degeneration, unspecified cervical region: Secondary | ICD-10-CM | POA: Diagnosis not present

## 2022-10-26 DIAGNOSIS — M9903 Segmental and somatic dysfunction of lumbar region: Secondary | ICD-10-CM

## 2022-10-26 DIAGNOSIS — M9902 Segmental and somatic dysfunction of thoracic region: Secondary | ICD-10-CM

## 2022-10-26 NOTE — Patient Instructions (Signed)
Good to see you Give me an update in 2 weeks See me in 8 weeks

## 2022-10-28 ENCOUNTER — Encounter: Payer: Self-pay | Admitting: Family Medicine

## 2022-10-28 NOTE — Assessment & Plan Note (Signed)
Still difficult to say.  Patient continues to have significant difficulty on a daily basis.  Still having progressive arthritis of multiple joints and is having a revision of the left shoulder in the near future.  We also discussed with patient about the osteoporosis and continuing the treatment for this.  Still do not have a specific diagnosis for the polyarthralgia.  Workup with gastroenterology and neurology did not help [place] as much as we were anticipating.  We discussed potentially changing the Cymbalta again but secondary to the worsening of fatigue patient would like to continue to monitor and see how she responds.  May consider either increasing the Cymbalta or potentially changing to the Effexor.  Follow-up with me again in 6 to 8 weeks total time reviewing patient's notes as well as discussing with patient 31 minutes not including osteopathic manipulation

## 2022-11-13 NOTE — Progress Notes (Signed)
Neuropsychological Consultation   Patient:   Tara [name]   DOB:   May 01, 1960  MR Number:  [id number]  Location:  [facility] [place] [facility] [address] [place], [address] [place] [address] Dept: [phone]           Date of Service:   08/02/2022  Location of Service and Individuals present: Today's visit was conducted in my outpatient clinic office with the patient myself present.  Start Time:   10 AM End Time:   12 PM  1 hour and 15 minutes was spent in face-to-face clinical interview with the patient and the other 45 minutes was spent with records review, report writing and setting up testing protocols.  Patient Consent and Confidentiality: Limits of confidentiality including the fact that the patient was referred for neuropsychological evaluation with results being provided to her treating/referring physician Antoine Primas, DO as well as being made available in the patient's EMR for other appropriate medical professionals access.  Patient consented to proceeding with the evaluation.  Consent for Evaluation and Treatment:  Signed:  Yes Explanation of Privacy Policies:  Signed:  Yes Discussion of Confidentiality Limits:  Yes  Provider/Observer:  Arley Phenix, Psy.D.       Clinical Neuropsychologist       Billing Code/Service: 96116/96121  Chief Complaint:     Chief Complaint  Patient presents with   Memory Loss   Other    Attention and concentration difficulties    Reason for Service:    Tara [name] is a 62 year old female referred for neuropsychological evaluation by her treating physician Antoine Primas, DO for neuropsychological evaluation due to progressing difficulties with attention and concentration word finding difficulties and memory loss.  Patient has been having pain and other difficulties related to cervical degenerative disc disease, reports of neurocognitive deficits  over the past year or so, sleep difficulties and insomnia and longstanding difficulties with mitral valve prolapse.  The patient is also dealt with chronic pain issues in her left shoulder and the patient reports that she has noted developing and slowly changing issues with memory and attention/concentration.  She reports that this has not been a particular a sudden change.  Patient notes that she first started noticing differences in cognition around the time of the COVID pandemic.  Patient reports that she did not get COVID at that time.  The patient also has a past history of polyarthropathy and history of hypercalcemia.  Patient describes a gradual decline over the past couple years without sudden or specific epochs of significant change.  During the clinical interview today, the patient describes a steady in neurocognitive functioning that started around 2021 with clear discussions of these issues with her physicians by 2023.  The patient reports that she has noted forgetting important events and needing to write everything down, difficulty keeping up with appointments and information, processing information slower and essentially difficulty concentrating and attending to process information.  Patient describes anhedonia and lack of interest in experiencing difficulty sitting down and completing work.  Patient does acknowledge that some of this has to do with physical pain that she notes in her leg, shoulder etc.  Patient reports that she will forget to do something and feels like she regularly zones out at meetings.  Patient describes memory difficulties for both semantic information as well as episodic memory.  Patient also notes that she is not sleeping very well.  Patient is on no specific  sleep medications or narcotic pain medications.  Patient denies any history of traumatic injuries.  Along with the attention and concentration and memory difficulties as noted the patient does deny any changes in  geographic orientation, denies any auditory or visual hallucinations and denies any tremors etc.  There is no family history at the patient is aware of similar to this type of pattern.  Patient does note that she has been very moody and the sudden mood changes also tend to be correlated with the development of hot flashes.  Medical History:   Past Medical History:  Diagnosis Date   Anemia    Arthritis    Hyperlipidemia    Insomnia    Mitral valve prolapse    PONV (postoperative nausea and vomiting)          Patient Active Problem List   Diagnosis Date Noted   Neurocognitive deficits 07/07/2021   DDD (degenerative disc disease), cervical 04/13/2021   Osteoporosis without current pathological fracture 10/28/2020   Nonallopathic lesion of sacral region 02/26/2020   Hot flashes 02/23/2019   Other insomnia 02/23/2019   Primary osteoarthritis involving multiple joints 02/23/2019   Cystic acne vulgaris 02/23/2019   Bunion of right foot 02/23/2019   Varicose 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 02/23/2019   Multiple atypical nevi 02/23/2019   Polyarthralgia 08/25/2014   Mitral valve prolapse 03/03/2009    Onset and Duration of Symptoms: Symptoms developed sometime around 2021 and have steadily changed over time for the worst.  Triggering Factors: No known triggering events but the patient did have previous shoulder surgeries on both right and left shoulder surgery with shoulder replacement surgeries done with 1 failing and needing to be revised.  The surgeries in her shoulder took place between 2022 and 2023.  Patient also had right knee surgery in 1988.  Additional Tests and Measures from other records:  Neuroimaging Results: No neuroimaging has taken place at this point.  There have been lumbar MRI conducted showing mild disc bulging with probable areas between L3 and S1 that was felt to be generally mild for age with lumbar spine degeneration with no spinal or lateral  recess stenosis.  Laboratory Tests: Patient had biopsy taken during upper endoscopy and only mild reactive inflammatory changes with no evidence of eosinophilic esophagitis or other abnormalities.  Blood glucose measures through the past couple of years have all been within normal limits and no particular laboratory abnormalities noted.  Thyroid studies have been within normal limits but the patient has continued with urinary calcium elevations.  Sleep: Patient describes ongoing and persistent sleep difficulties.  Patient she feels like she gets "little to no good sleep.  The patient reports that she will get in bed around 9 to 9:30 PM and fall asleep around 10 PM.  Patient notes that she wakes up usually 2 hours later and is unable to go back to sleep or will take a very long time to go back to sleep.  The patient reports that on a good night that she sleeps approximately 4 total hours.  The patient denies any snoring or symptoms of obstructive sleep apnea.  Diet Pattern: Patient describes a good appetite but no significant weight changes or weight gain.  Behavioral Observation/Mental Status:   Tara [name]  presents as a 62 y.o.-year-old Right handed Caucasian Female who appeared her stated age. her dress was Appropriate and she was Well Groomed and her manners were Appropriate to the situation.  her participation was indicative of  Appropriate behaviors.  There were not physical disabilities noted.  she displayed an appropriate level of cooperation and motivation.    Interactions:    Active Appropriate  Attention:   abnormal and attention span appeared shorter than expected for age  Memory:   within normal limits; recent and remote memory intact  Visuo-spatial:   not examined  Speech (Volume):  normal  Speech:   normal; normal  Thought Process:  Coherent and Relevant  Coherent, Directed, and Logical  Though Content:  WNL; not suicidal and not homicidal  Orientation:   person,  place, time/date, and situation  Judgment:   Good  Planning:   Fair  Affect:    Appropriate  Mood:    Dysphoric  Insight:   Good  Intelligence:   very high  Marital Status/Living:  Patient was born and raised in [facility] [place] along with 4 siblings.  Developmental milestones were reached at appropriate time and no significant childhood illnesses out of normal childhood illnesses noted.  Patient current lives with her spouse of 36 years and this is her only marriage.  The patient has a 35 year old adult child with no neurological or psychiatric issues.  Educational and Occupational History:     Highest Level of Education:   Patient was always an excellent student and has completed her PhD after attending public schools and receiving her bachelors of nursing then her MBA and subsequently PhD.  Nurse, mental health Achievements: Patient received multiple awards for academic excellence throughout school and has had continuing ongoing education.  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  Work History:   Patient worked primarily as an Charity fundraiser in the past and has been working in Scientific laboratory technician for 36 years.  She has never had a job termination.  Hobbies and Interests: Bible study   Psychiatric History: No psychiatric history noted.   History of Substance Use or Abuse:  No concerns of substance abuse are reported.  Family Med/Psych History:  Family History  Problem Relation Age of Onset   Stroke Mother    Heart disease Mother    Heart disease Father    Diabetes Sister    Heart disease Sister    Diabetes Sister    Heart attack Brother 11   Diabetes Brother    Heart disease Brother    Sudden death Neg Hx    Hypertension Neg Hx    Hyperlipidemia Neg Hx    Breast cancer Neg Hx    Colon cancer Neg Hx    Esophageal cancer Neg Hx    Rectal cancer Neg Hx    Stomach cancer Neg Hx     Impression/DX:   Tara [name] is a 62 year old female referred for neuropsychological evaluation by her treating physician Antoine Primas, DO for neuropsychological evaluation due to progressing difficulties with attention and concentration word finding difficulties and memory loss.  Patient has been having pain and other difficulties related to cervical degenerative disc disease, reports of neurocognitive deficits over the past year or so, sleep difficulties and insomnia and longstanding difficulties with mitral valve prolapse.  The patient is also dealt with chronic pain issues in her left shoulder and the patient reports that she has noted developing and slowly changing issues with memory and attention/concentration.  She reports that this has not been a particular a sudden change.  Patient notes that she first started noticing differences in cognition around the time  of the COVID pandemic.  Patient reports that she did not get COVID at that time.  The patient also has a past history of polyarthropathy and history of hypercalcemia.  Patient describes a gradual decline over the past couple years without sudden or specific epochs of significant change  Disposition/Plan:  We have set the patient up for formal neuropsychological assessment and she will complete a foundational battery of the Wechsler Adult Intelligence Scale-IV and the Wechsler Memory Scale-IV.  There was no clear indication of significant major neurocognitive deficits but the patient is noting a progressive change in attention and memory.  Diagnosis:    Memory deficit  Attention and concentration deficit  Other insomnia        Note: This document was prepared using Dragon voice recognition software and may include unintentional dictation errors.   Electronically Signed   _______________________ Arley Phenix, Psy.D. Clinical Neuropsychologist

## 2022-11-19 ENCOUNTER — Telehealth: Payer: Self-pay | Admitting: *Deleted

## 2022-11-19 NOTE — Telephone Encounter (Signed)
   Patient Name: Tara [name]  DOB: [birth year] MRN: [id number]  Primary Cardiologist: Verne Carrow, MD  Chart reviewed as part of pre-operative protocol coverage. Given past medical history and time since last visit, based on ACC/AHA guidelines, TIFFINEY SPARROW is at acceptable risk for the planned procedure without further cardiovascular testing.   Patient can hold aspirin 81 mg for 7 days prior to procedure and should restart postprocedure when surgically safe.  I will route this recommendation to the requesting party via Epic fax function and remove from pre-op pool.  Please call with questions.  Napoleon Form, Leodis Rains, NP 11/19/2022, 11:21 AM

## 2022-11-19 NOTE — Telephone Encounter (Signed)
   Pre-operative Risk Assessment    Patient Name: Tara [name]  DOB: Jun 27, 1960 MRN: [id number]    DATE OF LAST VISIT: 10/03/22 DR. McALHANY DATE OF NEXT VISIT: NONE  Request for Surgical Clearance    Procedure:   LEFT TOTAL SHOULDER CONVERSION TO REVERSE  : Excision Left shoulder nodule; Conversion of left anatomic shoulder arthroplasty to reverse shoulder arthroplasty   Date of Surgery:  Clearance 02/07/23                                 Surgeon:  DR. Caryn Bee SUPPLE Surgeon's Group or Practice Name:  [facility] Phone number:  [phone] ATTN: Aida Raider Fax number:  [phone]   Type of Clearance Requested:   - Medical ; ASA   Type of Anesthesia:  General    Additional requests/questions:    Elpidio Anis   11/19/2022, 10:51 AM

## 2022-11-21 DIAGNOSIS — H524 Presbyopia: Secondary | ICD-10-CM | POA: Diagnosis not present

## 2022-11-23 ENCOUNTER — Encounter: Payer: Self-pay | Admitting: Dietician

## 2022-11-23 ENCOUNTER — Encounter: Payer: 59 | Attending: Internal Medicine | Admitting: Dietician

## 2022-11-23 VITALS — Ht 70.0 in | Wt 144.0 lb

## 2022-11-23 DIAGNOSIS — Z713 Dietary counseling and surveillance: Secondary | ICD-10-CM | POA: Insufficient documentation

## 2022-11-23 NOTE — Patient Instructions (Addendum)
Resources: The Sprout Book by Synthia Innocent Plantiful [employer] for [email or web address] [email or web address] Dr. Ottis Stain Dr. Karolee Ohs, "How not to Age"   Joseph's Pita Bread (not GF)  Consider Magnesium Glycinate and/or Magnesium Threonate Consider protein shake:  Orgain Simply Plant Based OR Completment Plant based

## 2022-11-23 NOTE — Progress Notes (Signed)
Medical Nutrition Therapy  Appointment Start time:  1005  Appointment End time:  1105 Patient is here today alone.  She was last seen by another RD in 2015  Primary concerns today: wants to know how to not have to work so hard to keep weight undercontrol, hypercalciuria,  tips to get her body in the best shape for shoulder surgery. She discussed that since menopause, she gets nauseous when she gets hot flashes and eating helps prevent this.  She states that she may eat at times to prevent this rather than hunger.  She states that she feels like she is exercising too much to maintain her weight and lacks time to do other things she enjoys. Referral diagnosis: [employer] Employee - no referral required Preferred learning style: no preference indicated Learning readiness: ready, change in progress)   NUTRITION ASSESSMENT  Anthropometrics 70" 144 lbs 11/23/2022 156 lbs 2015  Clinical Medical Hx: osteoarthritis, osteoporosis, hypercalciuria, HLD Medications: see list to include crestor Labs: Urine Calcium 334 on 11/2021, Vitamin D 32 on 10/25023, Vitamin B-12 744 on 12/25/2021 Notable Signs/Symptoms: Joints hurt and body hurts when she gets in the higher 140 range.  Lifestyle & Dietary Hx She is a [employer].  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  Daily fluid intake: adequate Supplements: psyillium in unsweetened almond milk, MVI, Vitamin D, Omega 3, Calcium Sleep: 4 hours broken  Stress / self-care: high stress, good self care Current average weekly physical activity: M, W, F - free weights for 30 minutes or more and 15 minutes of pilaties, restorative yoga on Fridays, running Tuesday and Thursday 3-4 miles and Saturday 6 miles. She has now increased her running 2 miles per day that she runs and walks 5 miles on M, W, F in attempt to maintain weight.  24-Hr Dietary Recall Avoids gluten, no dairy, no animal protein (Dr.  Suggested avoiding added salt, dairy and animal protein due to hypercalciuria.) First Meal: PB powder (reconstituted), persimmons, unsweetened coconut yogurt with flax seeds, coconut, almonds Snack: celery, broccoli, tomato, hummus, PB powder, persimmons Second Meal: salad, with chick peas, almonds, olive oil, vinegar, PB powder, kiwi, blueberries Snack: PB powder, apple Third Meal: salad, with tofu, chick peas, olive oil, vinegar Snack: PB powder, peanuts,  Beverages: water, unsweetened almond milk, Bubbly, herbal tea  Estimated Energy Needs Calories: 1900-2100 Protein: 85-95g  NUTRITION DIAGNOSIS  NB-1.1 Food and nutrition-related knowledge deficit As related to balance of carbohydrate, protein, and fat.  As evidenced by diet hx and patient report.   NUTRITION INTERVENTION  Nutrition education (E-1) on the following topics:  Review of current intake  Calorie Density, oil intake, weight Tips to add variety Resources for recipes Supplement intake and recommendations Protein and plant based eating Menopause symptoms, sleep, supplements, diet  Handouts Provided Include  [employer] developer of Lifestyle Medicine) packet  Learning Style & Readiness for Change Teaching method utilized: Visual & Auditory  Demonstrated degree of understanding via: Teach Back  Barriers to learning/adherence to lifestyle change: none  Goals Established by Pt Continue to eat mindfully Continue a whole foods, plant based diet Adequate plant protein Could supplementation with magnesium improve sleep?  Resources: The Sprout Book by Synthia Innocent Plantiful [employer] for [place] [email or web address] Dr. Ottis Stain Dr. Karolee Ohs, "How not to Age"   Joseph's Pita Bread (not GF)  Consider Magnesium Glycinate and/or Magnesium Threonate Consider protein shake:  [employer]  Based OR Completment Plant based   MONITORING & EVALUATION Dietary intake, weekly physical activity  prn  Next Steps  Patient is to call for questions.

## 2022-11-24 NOTE — Telephone Encounter (Signed)
Prolia VOB initiated via [email or web address]  Next Prolia inj DUE: 12/29/22

## 2022-12-02 NOTE — Telephone Encounter (Signed)
Prior Auth REQUIRED for [employer]

## 2022-12-05 ENCOUNTER — Other Ambulatory Visit: Payer: Self-pay | Admitting: Family Medicine

## 2022-12-05 ENCOUNTER — Other Ambulatory Visit: Payer: Self-pay

## 2022-12-05 MED ORDER — DULOXETINE HCL 20 MG PO CPEP
20.0000 mg | ORAL_CAPSULE | Freq: Every day | ORAL | 0 refills | Status: DC
  Filled 2022-12-05 – 2022-12-18 (×3): qty 90, 90d supply, fill #0

## 2022-12-06 ENCOUNTER — Other Ambulatory Visit (HOSPITAL_COMMUNITY): Payer: Self-pay

## 2022-12-10 ENCOUNTER — Encounter: Payer: Self-pay | Admitting: Cardiovascular Disease

## 2022-12-11 ENCOUNTER — Other Ambulatory Visit: Payer: Self-pay

## 2022-12-19 ENCOUNTER — Other Ambulatory Visit: Payer: Self-pay | Admitting: Adult Health

## 2022-12-19 ENCOUNTER — Other Ambulatory Visit (HOSPITAL_COMMUNITY): Payer: Self-pay

## 2022-12-19 ENCOUNTER — Other Ambulatory Visit: Payer: Self-pay

## 2022-12-19 MED ORDER — TRETINOIN 0.05 % EX CREA
TOPICAL_CREAM | Freq: Every day | CUTANEOUS | 2 refills | Status: AC
  Filled 2022-12-19 – 2022-12-25 (×2): qty 45, 30d supply, fill #0
  Filled 2023-01-29: qty 45, 30d supply, fill #1
  Filled 2023-03-06: qty 45, 30d supply, fill #2

## 2022-12-21 ENCOUNTER — Ambulatory Visit: Payer: 59 | Admitting: Family Medicine

## 2022-12-25 ENCOUNTER — Other Ambulatory Visit (HOSPITAL_COMMUNITY): Payer: Self-pay

## 2022-12-25 ENCOUNTER — Encounter: Payer: Self-pay | Admitting: Family Medicine

## 2022-12-26 NOTE — Telephone Encounter (Signed)
Pt ready for scheduling on or after 12/29/22  Out-of-pocket cost due at time of visit: $0  Primary: A[employer] Open Access Select Prolia co-insurance: 0% Admin fee co-insurance: 0%  Deductible: does not apply  Prior Auth: APPROVED PA# [id number]  Valid: 06/15/22-06/15/23  Secondary: N/A Prolia co-insurance:  Admin fee co-insurance:  Deductible:  Prior Auth:  PA# Valid:     ** This summary of benefits is an estimation of the patient's out-of-pocket cost. Exact cost may very based on individual plan coverage.

## 2022-12-26 NOTE — Telephone Encounter (Signed)
Prior Auth valid and on file PA# [id number]  Valid: 06/15/22-06/15/23

## 2022-12-31 ENCOUNTER — Ambulatory Visit (INDEPENDENT_AMBULATORY_CARE_PROVIDER_SITE_OTHER): Payer: 59 | Admitting: Internal Medicine

## 2022-12-31 ENCOUNTER — Encounter: Payer: Self-pay | Admitting: Internal Medicine

## 2022-12-31 VITALS — BP 104/68 | HR 69 | Ht 70.0 in | Wt 146.0 lb

## 2022-12-31 DIAGNOSIS — M810 Age-related osteoporosis without current pathological fracture: Secondary | ICD-10-CM

## 2022-12-31 DIAGNOSIS — R82994 Hypercalciuria: Secondary | ICD-10-CM

## 2022-12-31 MED ORDER — DENOSUMAB 60 MG/ML ~~LOC~~ SOSY
60.0000 mg | PREFILLED_SYRINGE | Freq: Once | SUBCUTANEOUS | Status: AC
Start: 2022-12-31 — End: 2022-12-31
  Administered 2022-12-31: 60 mg via SUBCUTANEOUS

## 2022-12-31 MED ORDER — DENOSUMAB 60 MG/ML ~~LOC~~ SOSY
60.0000 mg | PREFILLED_SYRINGE | Freq: Once | SUBCUTANEOUS | Status: AC
Start: 2023-06-30 — End: 2023-07-05
  Administered 2023-07-05: 60 mg via SUBCUTANEOUS

## 2022-12-31 NOTE — Patient Instructions (Addendum)
Continue Calcium intake 1000-1200 mg daily   Vitamin D 2000 iu daily   Please contact the breast center at [phone] to make an appointment for bone density

## 2022-12-31 NOTE — Progress Notes (Unsigned)
Name: Tara [name]  MRN/ DOB: [id number], 1960/12/05    Age/ Sex: 62 y.o., female    PCP: Caesar Bookman, NP   Reason for Endocrinology Evaluation: Osteoporosis      Date of Initial Endocrinology Evaluation: 10/28/2020    HPI: [name] is a 62 y.o. female with a past medical history of Osteoporosis, somatic dysfunction of the spine  . The patient presented for initial endocrinology clinic visit on 10/28/2020 for consultative assistance with her Osteoporosis .   Pt was diagnosed with osteoporosis:2021  Menarche at age : 34 Menopausal at age : [age] Fracture Hx: no Hx of HRT: no FH of osteoporosis or hip fracture: mother  Prior Hx of anti-estrogenic therapy :no Prior Hx of anti-resorptive therapy : She was offered Fosamax in 2021 but opted with [place] program for 6 months .  She was started on Prolia October/14/2022  No prior hx of glucocorticoids    SUBJECTIVE:    Today (12/31/22):  Ms. [name] is here for a follow up on osteoporosis and hypercalciuria.  She continues to follow-up with sports medicine for polyarthralgias She is scheduled for left shoulder surgery 02/07/2023  She has nausea and hot flashes  Denies any rash with Prolia injections   Denies recent falls  Denies bone fractures  Denies constipation or diarrhea  Saw GI and was treated with Abx for SB overgrowth   Last prolia injection 09/27/2022   Calcium 1000-1200 daily through food and pills  Vitamin D 2000 IU daily     HISTORY:  Past Medical History:  Past Medical History:  Diagnosis Date   Anemia    Arthritis    Hyperlipidemia    Insomnia    Mitral valve prolapse    PONV (postoperative nausea and vomiting)    Past Surgical History:  Past Surgical History:  Procedure Laterality Date   AUGMENTATION MAMMAPLASTY Bilateral    Dec 2019   BREAST BIOPSY Right    axillary node benign   KNEE SURGERY  1988   right   SHOULDER ARTHROSCOPY WITH LABRAL REPAIR Right 08/28/2012    Procedure: RIGHT SHOULDER ARTHROSCOPY WITH LABRAL DEBRIDEMENT, ASPIRATION AND DECOMPRESSION OF PARALABRAL CYST;  Surgeon: Senaida Lange, MD;  Location: [facility];  Service: Orthopedics;  Laterality: Right;   TOTAL SHOULDER ARTHROPLASTY Right 03/24/2020   Procedure: RIGHT REVERSE SHOULDER ARTHROPLASTY;  Surgeon: Francena Hanly, MD;  Location: [facility];  Service: Orthopedics;  Laterality: Right;    TOTAL SHOULDER ARTHROPLASTY Left 02/23/2021   Procedure: TOTAL SHOULDER ARTHROPLASTY;  Surgeon: Francena Hanly, MD;  Location: [facility];  Service: Orthopedics;  Laterality: Left;    Social History:  reports that she has never smoked. She has never been exposed to tobacco smoke. She has never used smokeless tobacco. She reports that she does not drink alcohol and does not use drugs. Family History: family history includes Diabetes in her brother, sister, and sister; Heart attack (age of onset: 29) in her brother; Heart disease in her brother, father, mother, and sister; Stroke in her mother.   HOME MEDICATIONS: Allergies as of 12/31/2022       Reactions   Codeine Nausea And Vomiting   Hydrocodone Nausea And Vomiting   Triple Antibiotic Pain Relief [neomy-bacit-polymyx-pramoxine] Other (See Comments)   Burn   Triple Antibiotic W/hydrocortisone  [bacitra-neomycin-polymyxin-hc]    Contrast Media [iodinated Contrast Media] Hives   After CT scan with contrast, pt had MRI without contrast. Pt informed MRI Tech that she had a  rash on her legs. MRI Tech spoke with Radiologist after MRI was completed. Per Rad, since patient was here x 30 minutes after CT contrast and was doing fine, with rash fading, pt was allowed to leave facility. Alert and oriented. jmh        Medication List        Accurate as of December 31, 2022  3:06 PM. If you have any questions, ask your nurse or doctor.          STOP taking these medications    DULoxetine 20 MG capsule Commonly known as: Cymbalta Stopped by: [name]  Miner Koral       TAKE these medications    AMBULATORY NON FORMULARY MEDICATION Medication Name: Mindful Evening Cocoa Mix (Melatonin 3 mg, Theanine 5 mg, GABA 100 mg)   aspirin EC 81 MG tablet Take 1 tablet (81 mg total) by mouth daily. Swallow whole.   CALCIUM CITRATE PO Take 600 mg by mouth 2 (two) times daily. Number varies each day based on calcium ingested in food   cholecalciferol 25 MCG (1000 UNIT) tablet Commonly known as: VITAMIN D3 Take 1,000 Units by mouth daily.   denosumab 60 MG/ML Sosy injection Commonly known as: PROLIA Inject 60 mg into the skin every 6 (six) months.   Efinaconazole 10 % Soln Apply 1 drop topically daily. What changed:  when to take this reasons to take this   Fish Oil 1000 MG Caps Take 1 capsule (1,000 mg total) by mouth daily.   hydroquinone 4 % cream Apply 1 Application topically to affected areas 2 (two) times daily.   multivitamin with minerals Tabs tablet Take 1 tablet by mouth daily.   Psyllium 100 % Powd   rosuvastatin 5 MG tablet Commonly known as: CRESTOR Take 1 tablet (5 mg total) by mouth daily.   tretinoin 0.05 % cream Commonly known as: RETIN-A Apply topically at bedtime.          REVIEW OF SYSTEMS: A comprehensive ROS was conducted with the patient and is negative except as per HPI    OBJECTIVE:  VS: BP 104/68 (BP Location: Right Arm, Patient Position: Sitting, Cuff Size: Small)   Pulse 69   Ht 5\' 10"  (1.778 m)   Wt 146 lb (66.2 kg)   LMP 08/14/2012   SpO2 99%   BMI 20.95 kg/m    Wt Readings from Last 3 Encounters:  12/31/22 146 lb (66.2 kg)  11/23/22 144 lb (65.3 kg)  10/26/22 147 lb (66.7 kg)     EXAM: General: Pt appears well and is in NAD  Neck: General: Supple without adenopathy. Thyroid: Thyroid size normal.  No goiter or nodules appreciated.   Lungs: Clear with good BS bilat with no rales, rhonchi, or wheezes  Heart: Auscultation: RRR.  Abdomen: Normoactive bowel sounds, soft,  nontender, without masses or organomegaly palpable  Extremities:  BL LE: No pretibial edema normal ROM and strength.  Skin: Hair: Texture and amount normal with gender appropriate distribution Skin Inspection: No rashes Skin Palpation: Skin temperature, texture, and thickness normal to palpation  Mental Status: Judgment, insight: Intact Orientation: Oriented to time, place, and person Mood and affect: No depression, anxiety, or agitation     DATA REVIEWED:    Latest Reference Range & Units 12/13/21 09:28  Sodium 135 - 145 mEq/L 135  Potassium 3.5 - 5.1 mEq/L 4.3  Chloride 96 - 112 mEq/L 103  CO2 19 - 32 mEq/L 26  Glucose 70 - 99 mg/dL 161 (H)  BUN 6 - 23 mg/dL 22  Creatinine 1.61 - 0.96 mg/dL 0.45  Calcium 8.4 - 40.9 mg/dL 9.7  GFR >81.19 mL/min 100.14     Latest Reference Range & Units 12/13/21 09:28  VITD 30.00 - 100.00 ng/mL 32.60    Latest Reference Range & Units 12/13/21 09:28  TSH 0.35 - 5.50 uIU/mL 1.55  T4,Free(Direct) 0.60 - 1.60 ng/dL 1.47    Latest Reference Range & Units 10/31/20 07:59 05/18/21 11:30 12/13/21 09:28  Calcium, 24H Urine mg/24 h 313 (H) 336 (H) 334 (H)  Creatinine, 24H Ur 0.50 - 2.15 g/24 h 0.93 0.73 1.18    DXA 10/05/2020 The BMD measured at AP Spine L1-L4 is 0.825 g/cm2 with a T-score of -3.0. This patient is considered osteoporotic according to World Health Organization [facility]) criteria.   The quality of the exam is good.   Site Region Measured Date Measured Age YA BMD Significant CHANGE T-score AP Spine L1-L4 10/05/2020 59.1 -3.0 0.825 g/cm2 * AP Spine  L1-L4      08/11/2019    57.9         -2.5    0.874 g/cm2   DualFemur Neck Left 10/05/2020 59.1 -2.3 0.716 g/cm2 * DualFemur Neck Left  08/11/2019    57.9         -1.9    0.772 g/cm2   DualFemur Total Mean 10/05/2020 59.1 -2.0 0.754 g/cm2 * DualFemur Total Mean 08/11/2019    57.9         -1.7    0.787 g/cm2   ASSESSMENT/PLAN/RECOMMENDATIONS:   Osteoporosis:  -Multifactorial given  family history of osteoporosis, as well as hypercalciuria -She was started on  Prolia October 2022 without any side effects - Of note the pt has hx of peptic ulcer and enteritis and is not a candidate for oral bisphosphonate therapy -She continues to consume optimal amount of calcium through her food -We had reduced vitamin D and an attempt to see if this will affect hypercalciuria but it did not, since vitamin D levels are borderline low, we will increase vitamin D as below   Medications : Continue calcium 1000-1200 daily through food Increase  vitamin D 2000 IU daily Continue Prolia 60 mg [place] every 6 months   2.  Hypercalciuria:  -This is one of the contributing factors to osteoporosis -She is on a low salt diet, we have attempted to reduce vitamin D to see if this will have any impact but it did not -We discussed thiazide diuretic, I have recommended chlorthalidone due to long mechanism of action, but she is skeptical regarding side effects as she already has dizziness and polydipsia -We opted to not intervene at this time and just continue with Prolia -We will repeat bone density by her next visit with me   Follow-up in 1 yr    Signed electronically by: Lyndle Herrlich, MD  [facility]  [facility] [address] [place] [place], [place] [address] Phone: [phone] FAX: [phone]   CC: Caesar Bookman, NP [address] [place] [address] Phone: [phone] Fax: [phone]   Return to Endocrinology clinic as below: Future Appointments  Date Time Provider Department Center  02/01/2023  1:40 PM [name], Donalee Citrin, NP [facility] None  02/01/2023  3:30 PM Judi Saa, DO [facility] None  03/06/2023  4:00 PM [facility] 1 [id number]  05/09/2023  9:00 AM [name], Donalee Citrin, NP [facility] None  06/10/2023  3:00 PM Hershal Coria, PsyD  [facility]  06/17/2023  1:00 PM Hershal Coria, PsyD [facility]  09/06/2023   9:45 AM Ardelle Anton Lesia Sago, DPM [facility]

## 2022-12-31 NOTE — Progress Notes (Unsigned)
After obtaining consent, and per orders of Dr. Shamleffer, injection of Prolia 60 mg  given by Sheral Pfahler L Waunita Sandstrom in left arm SQ. Patient instructed to remain in clinic for 20 minutes afterwards, and to report any adverse reaction to me immediately.  

## 2023-01-01 LAB — PTH, INTACT AND CALCIUM
Calcium: 9.4 mg/dL (ref 8.6–10.4)
PTH: 37 pg/mL (ref 16–77)

## 2023-01-01 LAB — T4, FREE: Free T4: 0.74 ng/dL (ref 0.60–1.60)

## 2023-01-01 LAB — VITAMIN D 25 HYDROXY (VIT D DEFICIENCY, FRACTURES): VITD: 34.44 ng/mL (ref 30.00–100.00)

## 2023-01-01 LAB — TSH: TSH: 1.49 u[IU]/mL (ref 0.35–5.50)

## 2023-01-01 LAB — PHOSPHORUS: Phosphorus: 3.3 mg/dL (ref 2.3–4.6)

## 2023-01-04 ENCOUNTER — Ambulatory Visit: Payer: 59 | Admitting: Internal Medicine

## 2023-01-04 ENCOUNTER — Encounter: Payer: Self-pay | Admitting: Internal Medicine

## 2023-01-09 NOTE — Telephone Encounter (Signed)
 Last Prolia inj 12/31/22 Next Prolia inj due 07/01/23

## 2023-01-16 ENCOUNTER — Encounter: Payer: Self-pay | Admitting: Family Medicine

## 2023-01-22 ENCOUNTER — Other Ambulatory Visit: Payer: Self-pay

## 2023-01-22 DIAGNOSIS — M2550 Pain in unspecified joint: Secondary | ICD-10-CM

## 2023-01-29 ENCOUNTER — Other Ambulatory Visit (HOSPITAL_BASED_OUTPATIENT_CLINIC_OR_DEPARTMENT_OTHER): Payer: Self-pay

## 2023-01-29 ENCOUNTER — Other Ambulatory Visit (HOSPITAL_COMMUNITY): Payer: Self-pay

## 2023-01-30 ENCOUNTER — Telehealth: Payer: Self-pay | Admitting: Family

## 2023-01-30 NOTE — Progress Notes (Unsigned)
[name] [address] [place] [address] Phone: [phone] Subjective:   Tara [name], am serving as a scribe for Dr. Antoine Primas.  I'm seeing this patient by the request  of:  [name], [name], NP  CC: Pain all over, back and neck pain  [id number]  Tara [name] is a 62 y.o. female coming in with complaint of back and neck pain. OMT on 10/26/2022.  Patient unfortunately continues to have significant discomfort and pain.  Patient is scheduled in days to have her second revision and third total shoulder replacement.  Continued workup for hypercalciuria has been relatively unremarkable to find other causes of patient's significant osteoarthritis, osteoporosis at a young age.  Patient continues to have pain that is affecting daily activities and makes it extremely difficult to increase any activity.  Sometimes nighttime pain as well.  Workup has included gastroenterology, endocrinology, PMR, as well as [place] in primary care.  Continues to have deteriorating quality of life.    Medications patient has been prescribed:   Taking:         Reviewed prior external information including notes and imaging from previsou exam, outside providers and external EMR if available.   As well as notes that were available from care everywhere and other healthcare systems.  Past medical history, social, surgical and family history all reviewed in electronic medical record.  No pertanent information unless stated regarding to the chief complaint.   Past Medical History:  Diagnosis Date   Anemia    Arthritis    Hyperlipidemia    Insomnia    Mitral valve prolapse    PONV (postoperative nausea and vomiting)     Allergies  Allergen Reactions   Codeine Nausea And Vomiting   Hydrocodone Nausea And Vomiting   Triple Antibiotic Pain Relief [Neomy-Bacit-Polymyx-Pramoxine] Other (See Comments)    Burned skin   Triple Antibiotic W/Hydrocortisone   [Bacitra-Neomycin-Polymyxin-Hc]     Burned skin    Contrast Media [Iodinated Contrast Media] Hives    After CT scan with contrast, pt had MRI without contrast. Pt informed MRI Tech that she had a rash on her legs. MRI Tech spoke with Radiologist after MRI was completed. Per Rad, since patient was here x 30 minutes after CT contrast and was doing fine, with rash fading, pt was allowed to leave facility. Alert and oriented. jmh     Review of Systems:  No headache, visual changes, nausea, vomiting, diarrhea, constipation, dizziness, abdominal pain, skin rash, fevers, chills, night sweats, weight loss, swollen lymph nodes, body aches, joint swelling, chest pain, shortness of breath, mood changes. POSITIVE muscle aches  Objective  Height 5\' 10"  (1.778 m), last menstrual period 08/14/2012.   General: No apparent distress alert and oriented x3 mood and affect normal, dressed appropriately.  HEENT: Pupils equal, extraocular movements intact  Respiratory: Patient's speak in full sentences and does not appear short of breath  Cardiovascular: No lower extremity edema, non tender, no erythema  Significant loss of lordosis of the lumbar spine.  Tightness noted in the paraspinal musculature.  Patient's back does have some limited sidebending bilaterally.  Osteopathic findings  C2 flexed rotated and side bent right C6 flexed rotated and side bent left T3 extended rotated and side bent right inhaled rib T9 extended rotated and side bent left L2 flexed rotated and side bent right L4 flexed rotated and side bent left Sacrum right on right       Assessment and Plan:  DDD (degenerative disc disease), cervical This has been of significant challenging case.  Patient is having significant arthritic changes that is significantly premature for an individual with her being so active and fit.  Patient has been found to have more of a hypocalcemia hypercalciuria, we have had difficulty with vitamin D.  Still  not taking the Cymbalta regularly.  Patient is already going to be on her third revision of a shoulder replacement.  Concern is continuing to have worsening arthritic changes of multiple joints.  Patient has had difficulty with narcotics previously and we discussed synergistic effect of Tylenol and tramadol.  Recent workup to include calcium is within normal range at the moment.  Continuing Prolia for the osteoporosis.  We are going to do more of a research article check to see if there is any other possibilities of treatment for this individual.  The patient has seen multiple different providers including cardiology, gastrology, endocrinology, PMR.  Hypercalciuria Patient has been seen by endocrinology. Recommendation has been on thiazide diuretic but concern for possible allergies.  Do think it is a possibility.  Patient is concerned with low blood pressure and do feel that we could consider fludrocortisone to treat any side effects of this and potentially help her with some of the lower blood pressure as well as help her with adrenal function.  Typical dosing would be 0.1 to 0.3 mg daily.  I think this would help to also include phosphate supplementation and potentially Calcitrol.  Should consider renal ultrasounds as well.  Do think we will need to potentially try some of these medications to help with her quality of life but would wait until after surgery to make changes.  Will try to send patient some literature so she can read about this.    Nonallopathic problems  Decision today to treat with OMT was based on Physical Exam  After verbal consent patient was treated with HVLA, ME, FPR techniques in cervical, rib, thoracic, lumbar, and sacral  areas  Patient tolerated the procedure well with improvement in symptoms  Patient given exercises, stretches and lifestyle modifications  See medications in patient instructions if given  Patient will follow up in 4-8 weeks    The above  documentation has been reviewed and is accurate and complete Tara Saa, DO          Note: This dictation was prepared with Dragon dictation along with smaller phrase technology. Any transcriptional errors that result from this process are unintentional.

## 2023-01-30 NOTE — Telephone Encounter (Signed)
Surgical clearance form received for upcoming left shoulder reverse arthroplasty.forms completed placed in outgoing fax box.please fax as requested to [facility].

## 2023-01-31 ENCOUNTER — Ambulatory Visit: Payer: 59 | Admitting: Family

## 2023-01-31 VITALS — BP 116/68 | HR 58 | Temp 97.6°F | Resp 20 | Ht 70.0 in | Wt 146.0 lb

## 2023-01-31 DIAGNOSIS — Z01818 Encounter for other preprocedural examination: Secondary | ICD-10-CM | POA: Diagnosis not present

## 2023-01-31 DIAGNOSIS — M25512 Pain in left shoulder: Secondary | ICD-10-CM | POA: Diagnosis not present

## 2023-01-31 DIAGNOSIS — G8929 Other chronic pain: Secondary | ICD-10-CM

## 2023-01-31 NOTE — Patient Instructions (Addendum)
SURGICAL WAITING ROOM VISITATION Patients having surgery or a procedure may have no more than 2 support people in the waiting area - these visitors may rotate.    Children under the age of 73 must have an adult with them who is not the patient.  If the patient needs to stay at the hospital during part of their recovery, the visitor guidelines for inpatient rooms apply. Pre-op nurse will coordinate an appropriate time for 1 support person to accompany patient in pre-op.  This support person may not rotate.    Please refer to the [facility] website for the visitor guidelines for Inpatients (after your surgery is over and you are in a regular room).       Your procedure is scheduled on: 02-07-23   Report to [facility] Main Entrance    Report to admitting at 5:15 AM   Call this number if you have problems the morning of surgery [phone]   Do not eat food :After Midnight.   After Midnight you may have the following liquids until 4:30 AM DAY OF SURGERY  Water Non-Citrus Juices (without pulp, NO RED-Apple, White grape, White cranberry) Black Coffee (NO MILK/CREAM OR CREAMERS, sugar ok)  Clear Tea (NO MILK/CREAM OR CREAMERS, sugar ok) regular and decaf                             Plain Jell-O (NO RED)                                           Fruit ices (not with fruit pulp, NO RED)                                     Popsicles (NO RED)                                                               Sports drinks like Gatorade (NO RED)                   The day of surgery:  Drink ONE (1) Pre-Surgery Clear Ensure by 4:30 AM the morning of surgery. Drink in one sitting. Do not sip.  This drink was given to you during your hospital  pre-op appointment visit. Nothing else to drink after completing the Pre-Surgery Clear Ensure.          If you have questions, please contact your surgeon's office.   FOLLOW  ANY ADDITIONAL PRE OP INSTRUCTIONS YOU RECEIVED FROM YOUR SURGEON'S  OFFICE!!!     Oral Hygiene is also important to reduce your risk of infection.                                    Remember - BRUSH YOUR TEETH THE MORNING OF SURGERY WITH YOUR REGULAR TOOTHPASTE   Do NOT smoke after Midnight   Take these medicines the morning of surgery with A SIP OF WATER:   None  Stop all vitamins  and herbal supplements 7 days before surgery                              You may not have any metal on your body including hair pins, jewelry, and body piercing             Do not wear make-up, lotions, powders, perfumes or deodorant  Do not wear nail polish including gel and S&S, artificial/acrylic nails, or any other type of covering on natural nails including finger and toenails. If you have artificial nails, gel coating, etc. that needs to be removed by a nail salon please have this removed prior to surgery or surgery may need to be canceled/ delayed if the surgeon/ anesthesia feels like they are unable to be safely monitored.   Do not shave  48 hours prior to surgery.    Do not bring valuables to the hospital. [name] IS NOT RESPONSIBLE   FOR VALUABLES.   Contacts, dentures or bridgework may not be worn into surgery.  DO NOT BRING YOUR HOME MEDICATIONS TO THE HOSPITAL. PHARMACY WILL DISPENSE MEDICATIONS LISTED ON YOUR MEDICATION LIST TO YOU DURING YOUR ADMISSION IN THE HOSPITAL!    Patients discharged on the day of surgery will not be allowed to drive home.  Someone NEEDS to stay with you for the first 24 hours after anesthesia.              Please read over the following fact sheets you were given: IF YOU HAVE QUESTIONS ABOUT YOUR PRE-OP INSTRUCTIONS PLEASE CALL [phone] [name]  If you received a COVID test during your pre-op visit  it is requested that you wear a mask when out in public, stay away from anyone that may not be feeling well and notify your surgeon if you develop symptoms. If you test positive for Covid or have been in contact with anyone that has  tested positive in the last 10 days please notify you surgeon.   [name]- Preparing for Total Shoulder Arthroplasty    Before surgery, you can play an important role. Because skin is not sterile, your skin needs to be as free of germs as possible. You can reduce the number of germs on your skin by using the following products. Benzoyl Peroxide Gel Reduces the number of germs present on the skin Applied twice a day to shoulder area starting two days before surgery    ==================================================================  Please follow these instructions carefully:  BENZOYL PEROXIDE 5% GEL  Please do not use if you have an allergy to benzoyl peroxide.   If your skin becomes reddened/irritated stop using the benzoyl peroxide.  Starting two days before surgery, apply as follows: Apply benzoyl peroxide in the morning and at night. Apply after taking a shower. If you are not taking a shower clean entire shoulder front, back, and side along with the armpit with a clean wet washcloth.  Place a quarter-sized dollop on your shoulder and rub in thoroughly, making sure to cover the front, back, and side of your shoulder, along with the armpit.   2 days before ____ AM   ____ PM              1 day before ____ AM   ____ PM                         Do this twice a day for two  days.  (Last application is the night before surgery, AFTER using the CHG soap as described below).  Do NOT apply benzoyl peroxide gel on the day of surgery.     Pre-operative 5 CHG Bath Instructions   You can play a key role in reducing the risk of infection after surgery. Your skin needs to be as free of germs as possible. You can reduce the number of germs on your skin by washing with CHG (chlorhexidine gluconate) soap before surgery. CHG is an antiseptic soap that kills germs and continues to kill germs even after washing.   DO NOT use if you have an allergy to chlorhexidine/CHG or antibacterial soaps. If  your skin becomes reddened or irritated, stop using the CHG and notify one of our RNs at [phone].   Please shower with the CHG soap starting 4 days before surgery using the following schedule:     Please keep in mind the following:  DO NOT shave, including legs and underarms, starting the day of your first shower.   You may shave your face at any point before/day of surgery.  Place clean sheets on your bed the day you start using CHG soap. Use a clean washcloth (not used since being washed) for each shower. DO NOT sleep with pets once you start using the CHG.   CHG Shower Instructions:  If you choose to wash your hair and private area, wash first with your normal shampoo/soap.  After you use shampoo/soap, rinse your hair and body thoroughly to remove shampoo/soap residue.  Turn the water OFF and apply about 3 tablespoons (45 ml) of CHG soap to a CLEAN washcloth.  Apply CHG soap ONLY FROM YOUR NECK DOWN TO YOUR TOES (washing for 3-5 minutes)  DO NOT use CHG soap on face, private areas, open wounds, or sores.  Pay special attention to the area where your surgery is being performed.  If you are having back surgery, having someone wash your back for you may be helpful. Wait 2 minutes after CHG soap is applied, then you may rinse off the CHG soap.  Pat dry with a clean towel  Put on clean clothes/pajamas   If you choose to wear lotion, please use ONLY the CHG-compatible lotions on the back of this paper.     Additional instructions for the day of surgery: DO NOT APPLY any lotions, deodorants, cologne, or perfumes.   Put on clean/comfortable clothes.  Brush your teeth.  Ask your nurse before applying any prescription medications to the skin.      CHG Compatible Lotions   Aveeno Moisturizing lotion  Cetaphil Moisturizing Cream  Cetaphil Moisturizing Lotion  Clairol Herbal Essence Moisturizing Lotion, Dry Skin  Clairol Herbal Essence Moisturizing Lotion, Extra Dry Skin   Clairol Herbal Essence Moisturizing Lotion, Normal Skin  Curel Age Defying Therapeutic Moisturizing Lotion with Alpha Hydroxy  Curel Extreme Care Body Lotion  Curel Soothing Hands Moisturizing Hand Lotion  Curel Therapeutic Moisturizing Cream, Fragrance-Free  Curel Therapeutic Moisturizing Lotion, Fragrance-Free  Curel Therapeutic Moisturizing Lotion, Original Formula  Eucerin Daily Replenishing Lotion  Eucerin Dry Skin Therapy Plus Alpha Hydroxy Crme  Eucerin Dry Skin Therapy Plus Alpha Hydroxy Lotion  Eucerin Original Crme  Eucerin Original Lotion  Eucerin Plus Crme Eucerin Plus Lotion  Eucerin TriLipid Replenishing Lotion  Keri Anti-Bacterial Hand Lotion  Keri Deep Conditioning Original Lotion Dry Skin Formula Softly Scented  Keri Deep Conditioning Original Lotion, Fragrance Free Sensitive Skin Formula  Keri Lotion Fast Absorbing Fragrance Free Sensitive  Skin Formula  Keri Lotion Fast Absorbing Softly Scented Dry Skin Formula  Keri Original Lotion  Keri Skin Renewal Lotion Keri Silky Smooth Lotion  Keri Silky Smooth Sensitive Skin Lotion  Nivea Body Creamy Conditioning Oil  Nivea Body Extra Enriched Lotion  Nivea Body Original Lotion  Nivea Body Sheer Moisturizing Lotion Nivea Crme  Nivea Skin Firming Lotion  NutraDerm 30 Skin Lotion  NutraDerm Skin Lotion  NutraDerm Therapeutic Skin Cream  NutraDerm Therapeutic Skin Lotion  ProShield Protective Hand Cream  Provon moisturizing lotion   PATIENT SIGNATURE_________________________________  NURSE SIGNATURE__________________________________  ________________________________________________________________________    Tara [name]  An incentive spirometer is a tool that can help keep your lungs clear and active. This tool measures how well you are filling your lungs with each breath. Taking long deep breaths may help reverse or decrease the chance of developing breathing (pulmonary) problems (especially infection)  following: A long period of time when you are unable to move or be active. BEFORE THE PROCEDURE  If the spirometer includes an indicator to show your best effort, your nurse or respiratory therapist will set it to a desired goal. If possible, sit up straight or lean slightly forward. Try not to slouch. Hold the incentive spirometer in an upright position. INSTRUCTIONS FOR USE  Sit on the edge of your bed if possible, or sit up as far as you can in bed or on a chair. Hold the incentive spirometer in an upright position. Breathe out normally. Place the mouthpiece in your mouth and seal your lips tightly around it. Breathe in slowly and as deeply as possible, raising the piston or the ball toward the top of the column. Hold your breath for 3-5 seconds or for as long as possible. Allow the piston or ball to fall to the bottom of the column. Remove the mouthpiece from your mouth and breathe out normally. Rest for a few seconds and repeat Steps 1 through 7 at least 10 times every 1-2 hours when you are awake. Take your time and take a few normal breaths between deep breaths. The spirometer may include an indicator to show your best effort. Use the indicator as a goal to work toward during each repetition. After each set of 10 deep breaths, practice coughing to be sure your lungs are clear. If you have an incision (the cut made at the time of surgery), support your incision when coughing by placing a pillow or rolled up towels firmly against it. Once you are able to get out of bed, walk around indoors and cough well. You may stop using the incentive spirometer when instructed by your caregiver.  RISKS AND COMPLICATIONS Take your time so you do not get dizzy or light-headed. If you are in pain, you may need to take or ask for pain medication before doing incentive spirometry. It is harder to take a deep breath if you are having pain. AFTER USE Rest and breathe slowly and easily. It can be helpful to  keep track of a log of your progress. Your caregiver can provide you with a simple table to help with this. If you are using the spirometer at home, follow these instructions: SEEK MEDICAL CARE IF:  You are having difficultly using the spirometer. You have trouble using the spirometer as often as instructed. Your pain medication is not giving enough relief while using the spirometer. You develop fever of 100.5 F (38.1 C) or higher. SEEK IMMEDIATE MEDICAL CARE IF:  You cough up bloody sputum that had  not been present before. You develop fever of 102 F (38.9 C) or greater. You develop worsening pain at or near the incision site. MAKE SURE YOU:  Understand these instructions. Will watch your condition. Will get help right away if you are not doing well or get worse. Document Released: 06/18/2006 Document Revised: 04/30/2011 Document Reviewed: 08/19/2006 [facility] Patient Information 2014 [place], [place].   ________________________________________________________________________

## 2023-01-31 NOTE — Progress Notes (Addendum)
COVID Vaccine Completed:  Yes  Date of COVID positive in last 90 days:  No  PCP - Ernestene Mention, NP Cardiologist - Earney Hamburg, MD  Cardiac clearance in Epic dated 11-19-22 by Robin Searing, NP  Medical clearance on chart dated 01-30-23  Chest x-ray - N/A EKG - 02-01-23 Epic Stress Test - Yes, several years ago ECHO - 03-09-22 Epic Cardiac Cath - N/A Pacemaker/ICD device last checked: Spinal Cord Stimulator:  N/A Coronary CT - 03-02-22 Epic  Bowel Prep - N/A  Sleep Study - N/A CPAP -   Fasting Blood Sugar - N/A Checks Blood Sugar _____ times a day  Last dose of GLP1 agonist-  N/A GLP1 instructions:  Hold 7 days before surgery    Last dose of SGLT-2 inhibitors-  N/A SGLT-2 instructions:  Hold 3 days before surgery   Blood Thinner Instructions:  Time Aspirin Instructions:  ASA 81.  Hold x7 days Last Dose:  01-30-23  Activity level:  Can go up a flight of stairs and perform activities of daily living without stopping and without symptoms of chest pain or shortness of breath.  Anesthesia review:  CAD, MVP  Patient denies shortness of breath, fever, cough and chest pain at PAT appointment  Patient verbalized understanding of instructions that were given to them at the PAT appointment. Patient was also instructed that they will need to review over the PAT instructions again at home before surgery.

## 2023-01-31 NOTE — Progress Notes (Signed)
Provider: Richarda Blade FNP-C  Mackey Varricchio, Donalee Citrin, NP  Patient Care Team: Morrissa Shein, Donalee Citrin, NP as PCP - General (Family Medicine) Kathleene Hazel, MD as PCP - Cardiology (Cardiology) Linna Darner, RD as Dietitian (Family Medicine) Antony Contras, MD as Consulting Physician (Ophthalmology) Adornetto, Billy Fischer, DMD (Dentistry) Linna Darner, RD as Dietitian [facility])  Extended Emergency Contact Information Primary Emergency Contact: [facility] Address: [address] [place], [place] [address] [place] Home Phone: [phone] Work Phone: [phone] Mobile Phone: [phone] Relation: Spouse  Code Status:  Full Code  Goals of care: Advanced Directive information    11/23/2022   11:50 AM  Advanced Directives  Does Patient Have a Medical Advance Directive? Yes     Chief Complaint  Patient presents with   Acute Visit    Patient presents today for a surgical clearance     HPI:  Pt is a 62 y.o. female seen today for an acute visit for surgical clearance for left shoulder nodule excision and conversion of left anatomic shoulder arthroplasty to reverse shoulder arthroplasty to be done on 02/07/2023 by Orthopedic Dr.Supple Caryn Bee.  Has a medical history of Arthritis,Hyperlipidemia,Mitral valve prolapse/Mitral regurgitation  states was cleared by Cardiologist Dr. Verne Carrow.  Currently on ASA EC 81 mg tablet daily.  States pre-op lab work will be done at [facility].     Past Medical History:  Diagnosis Date   Anemia    Arthritis    Hyperlipidemia    Insomnia    Mitral valve prolapse    PONV (postoperative nausea and vomiting)    Past Surgical History:  Procedure Laterality Date   AUGMENTATION MAMMAPLASTY Bilateral    Dec 2019   BREAST BIOPSY Right    axillary node benign   KNEE SURGERY  1988   right   SHOULDER ARTHROSCOPY WITH LABRAL REPAIR Right 08/28/2012   Procedure: RIGHT SHOULDER  ARTHROSCOPY WITH LABRAL DEBRIDEMENT, ASPIRATION AND DECOMPRESSION OF PARALABRAL CYST;  Surgeon: Senaida Lange, MD;  Location: [facility] OR;  Service: Orthopedics;  Laterality: Right;   TOTAL SHOULDER ARTHROPLASTY Right 03/24/2020   Procedure: RIGHT REVERSE SHOULDER ARTHROPLASTY;  Surgeon: Francena Hanly, MD;  Location: [facility];  Service: Orthopedics;  Laterality: Right;    TOTAL SHOULDER ARTHROPLASTY Left 02/23/2021   Procedure: TOTAL SHOULDER ARTHROPLASTY;  Surgeon: Francena Hanly, MD;  Location: [facility];  Service: Orthopedics;  Laterality: Left;    Allergies  Allergen Reactions   Codeine Nausea And Vomiting   Hydrocodone Nausea And Vomiting   Triple Antibiotic Pain Relief [Neomy-Bacit-Polymyx-Pramoxine] Other (See Comments)    Burned skin   Triple Antibiotic W/Hydrocortisone  [Bacitra-Neomycin-Polymyxin-Hc]     Burned skin    Contrast Media [Iodinated Contrast Media] Hives    After CT scan with contrast, pt had MRI without contrast. Pt informed MRI Tech that she had a rash on her legs. MRI Tech spoke with Radiologist after MRI was completed. Per Rad, since patient was here x 30 minutes after CT contrast and was doing fine, with rash fading, pt was allowed to leave facility. Alert and oriented. jmh    Outpatient Encounter Medications as of 01/31/2023  Medication Sig   AMBULATORY NON FORMULARY MEDICATION Medication Name: Mindful Evening Cocoa Mix (Melatonin 3 mg, Theanine 5 mg, GABA 100 mg) (Patient taking differently: Take 1 Scoop by mouth at bedtime. Medication Name: Mindful Evening Cocoa Mix (Melatonin 3 mg, Theanine 5  mg, GABA 100 mg))   aspirin EC 81 MG tablet Take 1 tablet (81 mg total) by mouth daily. Swallow whole.   calcium carbonate (CALCIUM 600) 600 MG TABS tablet Take 600-1,200 mg by mouth daily as needed (calcium deficiency).   Cholecalciferol (VITAMIN D) 50 MCG (2000 UT) tablet Take 2,000 Units by mouth daily.   denosumab (PROLIA) 60 MG/ML SOSY injection Inject 60 mg into the skin  every 6 (six) months.   Efinaconazole 10 % SOLN Apply 1 drop topically daily. (Patient taking differently: Apply 1 drop topically daily as needed (Toe Nail Fungus).)   hydroquinone 4 % cream Apply 1 Application topically to affected areas 2 (two) times daily. (Patient taking differently: Apply 1 Application topically daily.)   MAGNESIUM GLYCINATE PO Take 500 mg by mouth daily. Chelated, supplies 50 mg of magnesium   Multiple Vitamin (MULTIVITAMIN WITH MINERALS) TABS Take 1 tablet by mouth daily.   Omega-3 Fatty Acids (FISH OIL) 1200 MG CAPS Take 1,200 mg by mouth daily. 600 mg Omega-3 fatty acids   Psyllium Husk POWD Take 1 Dose by mouth daily. 1 dose = 1 to 2 tablespoons   rosuvastatin (CRESTOR) 5 MG tablet Take 1 tablet (5 mg total) by mouth daily.   tretinoin (RETIN-A) 0.05 % cream Apply topically at bedtime. (Patient taking differently: Apply 1 Application topically at bedtime.)   Facility-Administered Encounter Medications as of 01/31/2023  Medication   [START ON 06/30/2023] denosumab (PROLIA) injection 60 mg    Review of Systems  Constitutional:  Negative for appetite change, chills, fatigue, fever and unexpected weight change.  HENT:  Negative for congestion, dental problem, ear discharge, ear pain, facial swelling, hearing loss, nosebleeds, postnasal drip, rhinorrhea, sinus pressure, sinus pain, sneezing, sore throat, tinnitus and trouble swallowing.   Eyes:  Negative for pain, discharge, redness, itching and visual disturbance.  Respiratory:  Negative for cough, chest tightness, shortness of breath and wheezing.   Cardiovascular:  Negative for chest pain, palpitations and leg swelling.  Gastrointestinal:  Negative for abdominal distention, abdominal pain, blood in stool, constipation, diarrhea, nausea and vomiting.  Endocrine: Negative for cold intolerance, heat intolerance, polydipsia, polyphagia and polyuria.  Genitourinary:  Negative for difficulty urinating, dysuria, flank pain,  frequency and urgency.  Musculoskeletal:  Positive for arthralgias. Negative for back pain, gait problem, joint swelling, myalgias, neck pain and neck stiffness.       Shoulder pain   Skin:  Negative for color change, pallor, rash and wound.  Neurological:  Negative for dizziness, syncope, speech difficulty, weakness, light-headedness, numbness and headaches.  Hematological:  Does not bruise/bleed easily.  Psychiatric/Behavioral:  Negative for agitation, behavioral problems, confusion, hallucinations, self-injury, sleep disturbance and suicidal ideas. The patient is not nervous/anxious.     Immunization History  Administered Date(s) Administered   Influenza, High Dose Seasonal PF 11/29/2021   Influenza,inj,quad, With Preservative 12/21/2019   Influenza-Unspecified 09/20/2018, 11/23/2019, 10/20/2020, 11/16/2022   PFIZER(Purple Top)SARS-COV-2 Vaccination 02/26/2019, 03/19/2019, 12/11/2019   Tdap 02/21/2015   Zoster Recombinant(Shingrix) 11/14/2017, 01/20/2018   Pertinent  Health Maintenance Due  Topic Date Due   DEXA SCAN  10/06/2022   MAMMOGRAM  10/11/2024   Colonoscopy  06/07/2032   INFLUENZA VACCINE  Completed      12/31/2019    3:32 PM 11/04/2020    3:40 PM 05/03/2021    4:59 PM 05/07/2022    9:30 AM 11/23/2022   11:49 AM  Fall Risk  Falls in the past year? 0 0 0 0 0  Was there an  injury with Fall? 0 0 0 0   Fall Risk Category Calculator 0 0 0 0   Fall Risk Category (Retired) Low Low Low    (RETIRED) Patient Fall Risk Level Low fall risk Low fall risk Low fall risk    Patient at Risk for Falls Due to  No Fall Risks No Fall Risks No Fall Risks   Fall risk Follow up  Falls evaluation completed Falls evaluation completed     Functional Status Survey:    Vitals:   01/31/23 1001  Resp: 20  Weight: 146 lb (66.2 kg)  Height: 5\' 10"  (1.778 m)   Body mass index is 20.95 kg/m. Physical Exam Vitals reviewed.  Constitutional:      General: She is not in acute distress.     Appearance: Normal appearance. She is normal weight. She is not ill-appearing or diaphoretic.  HENT:     Head: Normocephalic.     Right Ear: Tympanic membrane, ear canal and external ear normal. There is no impacted cerumen.     Left Ear: Tympanic membrane, ear canal and external ear normal. There is no impacted cerumen.     Nose: Nose normal. No congestion or rhinorrhea.     Mouth/Throat:     Mouth: Mucous membranes are moist.     Pharynx: Oropharynx is clear. No oropharyngeal exudate or posterior oropharyngeal erythema.  Eyes:     General: No scleral icterus.       Right eye: No discharge.        Left eye: No discharge.     Extraocular Movements: Extraocular movements intact.     Conjunctiva/sclera: Conjunctivae normal.     Pupils: Pupils are equal, round, and reactive to light.  Neck:     Vascular: No carotid bruit.  Cardiovascular:     Rate and Rhythm: Normal rate and regular rhythm.     Pulses: Normal pulses.     Heart sounds: Murmur heard.     No friction rub. No gallop.  Pulmonary:     Effort: Pulmonary effort is normal. No respiratory distress.     Breath sounds: Normal breath sounds. No wheezing, rhonchi or rales.  Chest:     Chest wall: No tenderness.  Abdominal:     General: Bowel sounds are normal. There is no distension.     Palpations: Abdomen is soft. There is no mass.     Tenderness: There is no abdominal tenderness. There is no right CVA tenderness, left CVA tenderness, guarding or rebound.  Musculoskeletal:        General: No swelling.     Right shoulder: No swelling, tenderness or crepitus. Normal strength. Normal pulse.     Left shoulder: Tenderness present. No swelling or effusion. Decreased range of motion. Normal strength. Normal pulse.     Cervical back: Normal range of motion. No rigidity or tenderness.     Right lower leg: No edema.     Left lower leg: No edema.  Lymphadenopathy:     Cervical: No cervical adenopathy.  Skin:    General: Skin is warm  and dry.     Coloration: Skin is not pale.     Findings: No bruising, erythema, lesion or rash.  Neurological:     Mental Status: She is alert and oriented to person, place, and time.     Cranial Nerves: No cranial nerve deficit.     Sensory: No sensory deficit.     Motor: No weakness.     Coordination: Coordination  normal.     Gait: Gait normal.  Psychiatric:        Mood and Affect: Mood normal.        Speech: Speech normal.        Behavior: Behavior normal.        Thought Content: Thought content normal.        Judgment: Judgment normal.    Labs reviewed: Recent Labs    05/07/22 0000 12/31/22 1533  NA 138  --   K 4.1  --   CL 99  --   CO2 27  --   GLUCOSE 78  --   BUN 18  --   CREATININE 0.56  --   CALCIUM 9.5 9.4  PHOS  --  3.3   Recent Labs    05/07/22 0000  AST 26  ALT 19  BILITOT 0.4  PROT 7.2   Recent Labs    05/07/22 0000  WBC 6.1  NEUTROABS 4,490  HGB 15.0  HCT 43.9  MCV 97.6  PLT 262   Lab Results  Component Value Date   TSH 1.49 12/31/2022   No results found for: "HGBA1C" Lab Results  Component Value Date   CHOL 154 05/07/2022   HDL 65 05/07/2022   LDLCALC 77 05/07/2022   TRIG 50 05/07/2022   CHOLHDL 2.4 05/07/2022    Significant Diagnostic Results in last 30 days:  No results found.  Assessment/Plan 1. Chronic left shoulder pain (Primary) Tender to palpation with decreased ROM -Continue current pain regimen  2. Pre-operative clearance Cleared for surgery for medical standpoint moderate risk will need clearance with the cardiologist -Advised to hold aspirin 7 days prior to surgery started today - CBC with Differential/Platelet - Basic metabolic panel  Family/ staff Communication: Reviewed plan of care with patient verbalized understanding  Labs/tests ordered: Labs to be drawn with other lab work ordered by anesthesiologist at [facility] tomorrow per patient request lab orders given - CBC with Differential/Platelet - Basic  metabolic panel  Next Appointment: Return if symptoms worsen or fail to improve.   Caesar Bookman, NP

## 2023-01-31 NOTE — Patient Instructions (Signed)
Hold Asprin 81 mg tablet 7 days prior to surgery.staring today.

## 2023-02-01 ENCOUNTER — Other Ambulatory Visit: Payer: Self-pay

## 2023-02-01 ENCOUNTER — Ambulatory Visit (INDEPENDENT_AMBULATORY_CARE_PROVIDER_SITE_OTHER): Payer: 59 | Admitting: Family Medicine

## 2023-02-01 ENCOUNTER — Encounter: Payer: Self-pay | Admitting: Cardiovascular Disease

## 2023-02-01 ENCOUNTER — Ambulatory Visit: Payer: 59 | Admitting: Family

## 2023-02-01 ENCOUNTER — Encounter: Payer: Self-pay | Admitting: Family Medicine

## 2023-02-01 ENCOUNTER — Encounter (HOSPITAL_COMMUNITY): Payer: Self-pay

## 2023-02-01 ENCOUNTER — Encounter (HOSPITAL_COMMUNITY)
Admission: RE | Admit: 2023-02-01 | Discharge: 2023-02-01 | Disposition: A | Discharge status: Home / Self Care | Payer: 59 | Source: Ambulatory Visit | Attending: Orthopedic Surgery | Admitting: Orthopedic Surgery

## 2023-02-01 VITALS — Ht 70.0 in

## 2023-02-01 VITALS — BP 115/88 | HR 57 | Temp 98.3°F | Resp 18 | Ht 70.0 in | Wt 145.4 lb

## 2023-02-01 DIAGNOSIS — Z01818 Encounter for other preprocedural examination: Secondary | ICD-10-CM | POA: Insufficient documentation

## 2023-02-01 DIAGNOSIS — M9903 Segmental and somatic dysfunction of lumbar region: Secondary | ICD-10-CM | POA: Diagnosis not present

## 2023-02-01 DIAGNOSIS — M9901 Segmental and somatic dysfunction of cervical region: Secondary | ICD-10-CM

## 2023-02-01 DIAGNOSIS — R82994 Hypercalciuria: Secondary | ICD-10-CM | POA: Diagnosis not present

## 2023-02-01 DIAGNOSIS — R2232 Localized swelling, mass and lump, left upper limb: Secondary | ICD-10-CM | POA: Insufficient documentation

## 2023-02-01 DIAGNOSIS — I2510 Atherosclerotic heart disease of native coronary artery without angina pectoris: Secondary | ICD-10-CM | POA: Diagnosis not present

## 2023-02-01 DIAGNOSIS — M25512 Pain in left shoulder: Secondary | ICD-10-CM | POA: Insufficient documentation

## 2023-02-01 DIAGNOSIS — M9902 Segmental and somatic dysfunction of thoracic region: Secondary | ICD-10-CM | POA: Diagnosis not present

## 2023-02-01 DIAGNOSIS — D649 Anemia, unspecified: Secondary | ICD-10-CM | POA: Insufficient documentation

## 2023-02-01 DIAGNOSIS — M9908 Segmental and somatic dysfunction of rib cage: Secondary | ICD-10-CM | POA: Diagnosis not present

## 2023-02-01 DIAGNOSIS — M5030 Other cervical disc degeneration, unspecified cervical region: Secondary | ICD-10-CM

## 2023-02-01 DIAGNOSIS — I341 Nonrheumatic mitral (valve) prolapse: Secondary | ICD-10-CM | POA: Diagnosis not present

## 2023-02-01 DIAGNOSIS — M9904 Segmental and somatic dysfunction of sacral region: Secondary | ICD-10-CM

## 2023-02-01 DIAGNOSIS — M2550 Pain in unspecified joint: Secondary | ICD-10-CM | POA: Diagnosis not present

## 2023-02-01 LAB — BASIC METABOLIC PANEL
Anion gap: 6 (ref 5–15)
BUN: 26 mg/dL — ABNORMAL HIGH (ref 8–23)
CO2: 27 mmol/L (ref 22–32)
Calcium: 9 mg/dL (ref 8.9–10.3)
Chloride: 99 mmol/L (ref 98–111)
Creatinine, Ser: 0.56 mg/dL (ref 0.44–1.00)
GFR, Estimated: >60 mL/min (ref >60)
Glucose, Bld: 86 mg/dL (ref 70–99)
Potassium: 3.7 mmol/L (ref 3.5–5.1)
Sodium: 132 mmol/L — ABNORMAL LOW (ref 135–145)

## 2023-02-01 LAB — CBC
HCT: 39.7 % (ref 36.0–46.0)
Hemoglobin: 13.9 g/dL (ref 12.0–15.0)
MCH: 34.8 pg — ABNORMAL HIGH (ref 26.0–34.0)
MCHC: 35 g/dL (ref 30.0–36.0)
MCV: 99.5 fL (ref 80.0–100.0)
Platelets: 184 10*3/uL (ref 150–400)
RBC: 3.99 MIL/uL (ref 3.87–5.11)
RDW: 11.9 % (ref 11.5–15.5)
WBC: 5.1 10*3/uL (ref 4.0–10.5)
nRBC: 0 % (ref 0.0–0.2)

## 2023-02-01 LAB — SURGICAL PCR SCREEN
MRSA, PCR: NEGATIVE
Staphylococcus aureus: NEGATIVE

## 2023-02-01 NOTE — Patient Instructions (Addendum)
Happy Holidays Try to get back to you on Sunday See you again 6 weeks after surgery

## 2023-02-02 ENCOUNTER — Encounter: Payer: Self-pay | Admitting: Family Medicine

## 2023-02-02 DIAGNOSIS — R82994 Hypercalciuria: Secondary | ICD-10-CM | POA: Insufficient documentation

## 2023-02-02 NOTE — Assessment & Plan Note (Signed)
This has been of significant challenging case.  Patient is having significant arthritic changes that is significantly premature for an individual with her being so active and fit.  Patient has been found to have more of a hypocalcemia hypercalciuria, we have had difficulty with vitamin D.  Still not taking the Cymbalta regularly.  Patient is already going to be on her third revision of a shoulder replacement.  Concern is continuing to have worsening arthritic changes of multiple joints.  Patient has had difficulty with narcotics previously and we discussed synergistic effect of Tylenol and tramadol.  Recent workup to include calcium is within normal range at the moment.  Continuing Prolia for the osteoporosis.  We are going to do more of a research article check to see if there is any other possibilities of treatment for this individual.  The patient has seen multiple different providers including cardiology, gastrology, endocrinology, PMR.

## 2023-02-02 NOTE — Assessment & Plan Note (Signed)
Patient has been seen by endocrinology. Recommendation has been on thiazide diuretic but concern for possible allergies.  Do think it is a possibility.  Patient is concerned with low blood pressure and do feel that we could consider fludrocortisone to treat any side effects of this and potentially help her with some of the lower blood pressure as well as help her with adrenal function.  Typical dosing would be 0.1 to 0.3 mg daily.  I think this would help to also include phosphate supplementation and potentially Calcitrol.  Should consider renal ultrasounds as well.  Do think we will need to potentially try some of these medications to help with her quality of life but would wait until after surgery to make changes.  Will try to send patient some literature so she can read about this.

## 2023-02-04 ENCOUNTER — Other Ambulatory Visit: Payer: Self-pay

## 2023-02-04 ENCOUNTER — Other Ambulatory Visit (HOSPITAL_COMMUNITY): Payer: Self-pay

## 2023-02-04 DIAGNOSIS — Z4789 Encounter for other orthopedic aftercare: Secondary | ICD-10-CM | POA: Diagnosis not present

## 2023-02-04 DIAGNOSIS — Z96612 Presence of left artificial shoulder joint: Secondary | ICD-10-CM | POA: Diagnosis not present

## 2023-02-04 DIAGNOSIS — Z96611 Presence of right artificial shoulder joint: Secondary | ICD-10-CM | POA: Diagnosis not present

## 2023-02-04 MED ORDER — TRAMADOL HCL 50 MG PO TABS
ORAL_TABLET | ORAL | 0 refills | Status: DC
  Filled 2023-02-04: qty 40, 6d supply, fill #0
  Filled 2023-02-05: qty 40, 7d supply, fill #0

## 2023-02-04 MED ORDER — CYCLOBENZAPRINE HCL 10 MG PO TABS
ORAL_TABLET | ORAL | 0 refills | Status: DC
  Filled 2023-02-04: qty 30, 7d supply, fill #0
  Filled 2023-02-05: qty 30, 10d supply, fill #0

## 2023-02-04 MED ORDER — GABAPENTIN 300 MG PO CAPS
300.0000 mg | ORAL_CAPSULE | Freq: Two times a day (BID) | ORAL | 0 refills | Status: DC
  Filled 2023-02-04 – 2023-02-05 (×2): qty 60, 30d supply, fill #0

## 2023-02-04 MED ORDER — NAPROXEN 500 MG PO TABS
500.0000 mg | ORAL_TABLET | Freq: Two times a day (BID) | ORAL | 1 refills | Status: DC
  Filled 2023-02-04 – 2023-02-05 (×2): qty 60, 30d supply, fill #0
  Filled 2023-03-06: qty 60, 30d supply, fill #1

## 2023-02-04 MED ORDER — ONDANSETRON 4 MG PO TBDP
ORAL_TABLET | ORAL | 0 refills | Status: DC
  Filled 2023-02-04: qty 30, 7d supply, fill #0
  Filled 2023-02-05: qty 30, 8d supply, fill #0

## 2023-02-04 NOTE — Anesthesia Preprocedure Evaluation (Signed)
Anesthesia Evaluation  Patient identified by MRN, date of birth, ID band Patient awake    Reviewed: Allergy & Precautions, H&P , NPO status , Patient's Chart, lab work & pertinent test results  History of Anesthesia Complications (+) PONV and history of anesthetic complications  Airway Mallampati: II   Neck ROM: full    Dental   Pulmonary neg pulmonary ROS   breath sounds clear to auscultation       Cardiovascular + Valvular Problems/Murmurs MVP  Rhythm:regular Rate:Normal     Neuro/Psych    GI/Hepatic   Endo/Other    Renal/GU      Musculoskeletal  (+) Arthritis ,    Abdominal   Peds  Hematology   Anesthesia Other Findings   Reproductive/Obstetrics                             Anesthesia Physical Anesthesia Plan  ASA: 2  Anesthesia Plan: General   Post-op Pain Management: Regional block*   Induction: Intravenous  PONV Risk Score and Plan: 4 or greater and Ondansetron, Dexamethasone, Midazolam and Treatment may vary due to age or medical condition  Airway Management Planned: Oral ETT  Additional Equipment:   Intra-op Plan:   Post-operative Plan: Extubation in OR  Informed Consent: I have reviewed the patients History and Physical, chart, labs and discussed the procedure including the risks, benefits and alternatives for the proposed anesthesia with the patient or authorized representative who has indicated his/her understanding and acceptance.     Dental advisory given  Plan Discussed with: CRNA, Anesthesiologist and Surgeon  Anesthesia Plan Comments: (See PAT note 02/01/23)       Anesthesia Quick Evaluation

## 2023-02-04 NOTE — Progress Notes (Signed)
Anesthesia Chart Review   Case: [id number] Date/Time: 02/07/23 0715   Procedure: Excision Left shoulder nodule; Conversion of left anatomic shoulder arthroplasty to reverse shoulder arthroplasty (Left: Shoulder)   Anesthesia type: General   Pre-op diagnosis: Failed Left anatomic shoulder arthroplasty; painful nodule left shoulder   Location: [facility]OM 06 / [facility]   Surgeons: Francena Hanly, MD       DISCUSSION:62 y.o. never smoker with h/o PONV, moderate mitral regurgitation on Echo 02/2022, failed shoulder arthroplasty scheduled for above procedure 02/07/2023 with Dr. Francena Hanly.   Last seen by cardiology 10/03/2022.   Per cardiology preoperative evaluation 11/19/2022, "Chart reviewed as part of pre-operative protocol coverage. Given past medical history and time since last visit, based on ACC/AHA guidelines, Tara [name] is at acceptable risk for the planned procedure without further cardiovascular testing.    Patient can hold aspirin 81 mg for 7 days prior to procedure and should restart postprocedure when surgically safe."   VS: BP 115/88   Pulse (!) 57   Temp 36.8 C (Oral)   Resp 18   Ht 5\' 10"  (1.778 m)   Wt 66 kg   LMP 08/14/2012   SpO2 100%   BMI 20.86 kg/m   PROVIDERS: [name], Donalee Citrin, NP  Verne Carrow, MD is Cardiologist  LABS: Labs reviewed: Acceptable for surgery. (all labs ordered are listed, but only abnormal results are displayed)  Labs Reviewed  BASIC METABOLIC PANEL - Abnormal; Notable for the following components:      Result Value   Sodium 132 (*)    BUN 26 (*)    All other components within normal limits  CBC - Abnormal; Notable for the following components:   MCH 34.8 (*)    All other components within normal limits  SURGICAL PCR SCREEN     IMAGES:   EKG:   CV Echo 03/09/2022  1. Left ventricular ejection fraction, by estimation, is 60 to 65%. The  left ventricle has normal function. The left ventricle has no regional  wall  motion abnormalities. Left ventricular diastolic parameters were  normal.   2. Right ventricular systolic function is normal. The right ventricular  size is normal.   3. Prolapse of posterior leaflet Degree of MR not well characterized on  color flow but does not appear severe Can consider TEE to further evaluate  if clinically indicated . The mitral valve is myxomatous. Mild to moderate  mitral valve regurgitation. No  evidence of mitral stenosis.   4. The aortic valve is tricuspid. Aortic valve regurgitation is not  visualized. No aortic stenosis is present.   5. The inferior vena cava is normal in size with greater than 50%  respiratory variability, suggesting right atrial pressure of 3 mmHg.   Past Medical History:  Diagnosis Date   Anemia    Arthritis    Hyperlipidemia    Insomnia    Mitral valve prolapse    PONV (postoperative nausea and vomiting)     Past Surgical History:  Procedure Laterality Date   AUGMENTATION MAMMAPLASTY Bilateral    Dec 2019   BREAST BIOPSY Right    axillary node benign   KNEE SURGERY  1988   right   SHOULDER ARTHROSCOPY WITH LABRAL REPAIR Right 08/28/2012   Procedure: RIGHT SHOULDER ARTHROSCOPY WITH LABRAL DEBRIDEMENT, ASPIRATION AND DECOMPRESSION OF PARALABRAL CYST;  Surgeon: Senaida Lange, MD;  Location: [facility] OR;  Service: Orthopedics;  Laterality: Right;   TOTAL SHOULDER ARTHROPLASTY Right 03/24/2020   Procedure: RIGHT  REVERSE SHOULDER ARTHROPLASTY;  Surgeon: Francena Hanly, MD;  Location: [facility];  Service: Orthopedics;  Laterality: Right;    TOTAL SHOULDER ARTHROPLASTY Left 02/23/2021   Procedure: TOTAL SHOULDER ARTHROPLASTY;  Surgeon: Francena Hanly, MD;  Location: [facility];  Service: Orthopedics;  Laterality: Left;    MEDICATIONS:  AMBULATORY NON FORMULARY MEDICATION   aspirin EC 81 MG tablet   calcium carbonate (CALCIUM 600) 600 MG TABS tablet   Cholecalciferol (VITAMIN D) 50 MCG (2000 UT) tablet   denosumab (PROLIA) 60 MG/ML SOSY  injection   Efinaconazole 10 % SOLN   hydroquinone 4 % cream   MAGNESIUM GLYCINATE PO   Multiple Vitamin (MULTIVITAMIN WITH MINERALS) TABS   Omega-3 Fatty Acids (FISH OIL) 1200 MG CAPS   Psyllium Husk POWD   rosuvastatin (CRESTOR) 5 MG tablet   tretinoin (RETIN-A) 0.05 % cream    [START ON 06/30/2023] denosumab (PROLIA) injection 60 mg      [facility] Ward, PA-C [facility] Pre-Surgical Testing [phone]

## 2023-02-05 ENCOUNTER — Other Ambulatory Visit (HOSPITAL_COMMUNITY): Payer: Self-pay

## 2023-02-05 ENCOUNTER — Other Ambulatory Visit: Payer: Self-pay

## 2023-02-06 ENCOUNTER — Encounter: Payer: Self-pay | Admitting: Family

## 2023-02-07 ENCOUNTER — Encounter (HOSPITAL_COMMUNITY): Payer: Self-pay | Admitting: Orthopedic Surgery

## 2023-02-07 ENCOUNTER — Encounter (HOSPITAL_COMMUNITY)
Admission: RE | Disposition: A | Discharge status: Home / Self Care | Payer: Self-pay | Source: Home / Self Care | Attending: Orthopedic Surgery

## 2023-02-07 ENCOUNTER — Ambulatory Visit (HOSPITAL_COMMUNITY): Payer: Self-pay | Admitting: Physician Assistant

## 2023-02-07 ENCOUNTER — Ambulatory Visit (HOSPITAL_COMMUNITY): Payer: 59 | Admitting: Anesthesiology

## 2023-02-07 ENCOUNTER — Ambulatory Visit (HOSPITAL_COMMUNITY)
Admission: RE | Admit: 2023-02-07 | Discharge: 2023-02-07 | Disposition: A | Discharge status: Home / Self Care | Payer: 59 | Attending: Orthopedic Surgery | Admitting: Orthopedic Surgery

## 2023-02-07 ENCOUNTER — Other Ambulatory Visit: Payer: Self-pay

## 2023-02-07 DIAGNOSIS — T8484XA Pain due to internal orthopedic prosthetic devices, implants and grafts, initial encounter: Secondary | ICD-10-CM | POA: Insufficient documentation

## 2023-02-07 DIAGNOSIS — Z96612 Presence of left artificial shoulder joint: Secondary | ICD-10-CM

## 2023-02-07 DIAGNOSIS — T84098A Other mechanical complication of other internal joint prosthesis, initial encounter: Secondary | ICD-10-CM | POA: Diagnosis not present

## 2023-02-07 DIAGNOSIS — D492 Neoplasm of unspecified behavior of bone, soft tissue, and skin: Secondary | ICD-10-CM | POA: Diagnosis not present

## 2023-02-07 DIAGNOSIS — Y838 Other surgical procedures as the cause of abnormal reaction of the patient, or of later complication, without mention of misadventure at the time of the procedure: Secondary | ICD-10-CM | POA: Insufficient documentation

## 2023-02-07 DIAGNOSIS — G8918 Other acute postprocedural pain: Secondary | ICD-10-CM | POA: Diagnosis not present

## 2023-02-07 HISTORY — PX: TOTAL SHOULDER REVISION: SHX6130

## 2023-02-07 SURGERY — REVISION, TOTAL ARTHROPLASTY, SHOULDER
Anesthesia: General | Site: Shoulder | Laterality: Left

## 2023-02-07 MED ORDER — VANCOMYCIN HCL 1000 MG IV SOLR
INTRAVENOUS | Status: AC
  Filled 2023-02-07: qty 20

## 2023-02-07 MED ORDER — ROCURONIUM BROMIDE 10 MG/ML (PF) SYRINGE
PREFILLED_SYRINGE | INTRAVENOUS | Status: AC
  Filled 2023-02-07: qty 10

## 2023-02-07 MED ORDER — PHENYLEPHRINE HCL-NACL 20-0.9 MG/250ML-% IV SOLN
INTRAVENOUS | Status: DC | PRN
  Administered 2023-02-07: 35 ug/min via INTRAVENOUS

## 2023-02-07 MED ORDER — TRANEXAMIC ACID-NACL 1000-0.7 MG/100ML-% IV SOLN
1000.0000 mg | INTRAVENOUS | Status: AC
  Administered 2023-02-07: 1000 mg via INTRAVENOUS
  Filled 2023-02-07: qty 100

## 2023-02-07 MED ORDER — SCOPOLAMINE 1 MG/3DAYS TD PT72
1.0000 | MEDICATED_PATCH | TRANSDERMAL | Status: DC
  Administered 2023-02-07: 1 via TRANSDERMAL
  Filled 2023-02-07: qty 1

## 2023-02-07 MED ORDER — CHLORHEXIDINE GLUCONATE 0.12 % MT SOLN
15.0000 mL | Freq: Once | OROMUCOSAL | Status: AC
  Administered 2023-02-07: 15 mL via OROMUCOSAL

## 2023-02-07 MED ORDER — METOCLOPRAMIDE HCL 5 MG/ML IJ SOLN: 5.0000 mg | Freq: Three times a day (TID) | INTRAMUSCULAR | Status: DC | PRN

## 2023-02-07 MED ORDER — FENTANYL CITRATE PF 50 MCG/ML IJ SOSY: 25.0000 ug | PREFILLED_SYRINGE | INTRAMUSCULAR | Status: DC | PRN

## 2023-02-07 MED ORDER — CEFAZOLIN SODIUM-DEXTROSE 2-4 GM/100ML-% IV SOLN
2.0000 g | INTRAVENOUS | Status: AC
  Administered 2023-02-07: 2 g via INTRAVENOUS
  Filled 2023-02-07: qty 100

## 2023-02-07 MED ORDER — KETOROLAC TROMETHAMINE 15 MG/ML IJ SOLN
INTRAMUSCULAR | Status: DC | PRN
Start: 2023-02-07 — End: 2023-02-07
  Administered 2023-02-07: 15 mg via INTRAVENOUS

## 2023-02-07 MED ORDER — ACETAMINOPHEN 10 MG/ML IV SOLN
INTRAVENOUS | Status: AC
  Filled 2023-02-07: qty 100

## 2023-02-07 MED ORDER — FENTANYL CITRATE (PF) 100 MCG/2ML IJ SOLN
INTRAMUSCULAR | Status: DC | PRN
  Administered 2023-02-07 (×2): 50 ug via INTRAVENOUS

## 2023-02-07 MED ORDER — OXYCODONE HCL 5 MG/5ML PO SOLN: 5.0000 mg | Freq: Once | ORAL | Status: DC | PRN

## 2023-02-07 MED ORDER — BUPIVACAINE HCL (PF) 0.5 % IJ SOLN
INTRAMUSCULAR | Status: DC | PRN
  Administered 2023-02-07: 15 mL via PERINEURAL

## 2023-02-07 MED ORDER — PROPOFOL 1000 MG/100ML IV EMUL
INTRAVENOUS | Status: AC
  Filled 2023-02-07: qty 100

## 2023-02-07 MED ORDER — LACTATED RINGERS IV SOLN: INTRAVENOUS | Status: DC

## 2023-02-07 MED ORDER — METOCLOPRAMIDE HCL 5 MG PO TABS: 5.0000 mg | ORAL_TABLET | Freq: Three times a day (TID) | ORAL | Status: DC | PRN

## 2023-02-07 MED ORDER — PHENYLEPHRINE HCL-NACL 20-0.9 MG/250ML-% IV SOLN
INTRAVENOUS | Status: AC
  Filled 2023-02-07: qty 500

## 2023-02-07 MED ORDER — VANCOMYCIN HCL 1000 MG IV SOLR
INTRAVENOUS | Status: DC | PRN
  Administered 2023-02-07: 1000 mg

## 2023-02-07 MED ORDER — MIDAZOLAM HCL 5 MG/5ML IJ SOLN
INTRAMUSCULAR | Status: DC | PRN
  Administered 2023-02-07: 2 mg via INTRAVENOUS

## 2023-02-07 MED ORDER — EPHEDRINE SULFATE-NACL 50-0.9 MG/10ML-% IV SOSY
PREFILLED_SYRINGE | INTRAVENOUS | Status: DC | PRN
  Administered 2023-02-07: 5 mg via INTRAVENOUS

## 2023-02-07 MED ORDER — ONDANSETRON HCL 4 MG/2ML IJ SOLN
INTRAMUSCULAR | Status: DC | PRN
  Administered 2023-02-07: 4 mg via INTRAVENOUS

## 2023-02-07 MED ORDER — FENTANYL CITRATE (PF) 100 MCG/2ML IJ SOLN
INTRAMUSCULAR | Status: AC
  Filled 2023-02-07: qty 2

## 2023-02-07 MED ORDER — MIDAZOLAM HCL 2 MG/2ML IJ SOLN
INTRAMUSCULAR | Status: AC
  Filled 2023-02-07: qty 2

## 2023-02-07 MED ORDER — SUGAMMADEX SODIUM 200 MG/2ML IV SOLN
INTRAVENOUS | Status: DC | PRN
  Administered 2023-02-07: 200 mg via INTRAVENOUS

## 2023-02-07 MED ORDER — TRANEXAMIC ACID 1000 MG/10ML IV SOLN: 1000.0000 mg | INTRAVENOUS | Status: DC

## 2023-02-07 MED ORDER — ONDANSETRON HCL 4 MG/2ML IJ SOLN
INTRAMUSCULAR | Status: AC
  Filled 2023-02-07: qty 2

## 2023-02-07 MED ORDER — DEXAMETHASONE SODIUM PHOSPHATE 10 MG/ML IJ SOLN
INTRAMUSCULAR | Status: AC
  Filled 2023-02-07: qty 1

## 2023-02-07 MED ORDER — SCOPOLAMINE 1 MG/3DAYS TD PT72
MEDICATED_PATCH | TRANSDERMAL | Status: AC
  Filled 2023-02-07: qty 1

## 2023-02-07 MED ORDER — PROPOFOL 500 MG/50ML IV EMUL
INTRAVENOUS | Status: AC
  Filled 2023-02-07: qty 50

## 2023-02-07 MED ORDER — STERILE WATER FOR IRRIGATION IR SOLN
Status: DC | PRN
  Administered 2023-02-07: 1000 mL

## 2023-02-07 MED ORDER — ORAL CARE MOUTH RINSE
15.0000 mL | Freq: Once | OROMUCOSAL | Status: AC
Start: 2023-02-07 — End: 2023-02-07

## 2023-02-07 MED ORDER — OXYCODONE HCL 5 MG PO TABS
5.0000 mg | ORAL_TABLET | Freq: Once | ORAL | Status: DC | PRN
Start: 2023-02-07 — End: 2023-02-07

## 2023-02-07 MED ORDER — PHENYLEPHRINE HCL (PRESSORS) 10 MG/ML IV SOLN
INTRAVENOUS | Status: DC | PRN
  Administered 2023-02-07: 80 ug via INTRAVENOUS

## 2023-02-07 MED ORDER — DEXAMETHASONE SODIUM PHOSPHATE 10 MG/ML IJ SOLN
INTRAMUSCULAR | Status: DC | PRN
  Administered 2023-02-07: 10 mg via INTRAVENOUS

## 2023-02-07 MED ORDER — ONDANSETRON HCL 4 MG/2ML IJ SOLN
4.0000 mg | Freq: Four times a day (QID) | INTRAMUSCULAR | Status: AC | PRN
  Administered 2023-02-07: 4 mg via INTRAVENOUS

## 2023-02-07 MED ORDER — ROCURONIUM BROMIDE 100 MG/10ML IV SOLN
INTRAVENOUS | Status: DC | PRN
  Administered 2023-02-07: 15 mg via INTRAVENOUS
  Administered 2023-02-07: 50 mg via INTRAVENOUS

## 2023-02-07 MED ORDER — 0.9 % SODIUM CHLORIDE (POUR BTL) OPTIME
TOPICAL | Status: DC | PRN
  Administered 2023-02-07: 1000 mL

## 2023-02-07 MED ORDER — BUPIVACAINE LIPOSOME 1.3 % IJ SUSP
INTRAMUSCULAR | Status: DC | PRN
  Administered 2023-02-07: 10 mL via PERINEURAL

## 2023-02-07 MED ORDER — ACETAMINOPHEN 10 MG/ML IV SOLN
1000.0000 mg | Freq: Once | INTRAVENOUS | Status: AC
Start: 2023-02-07 — End: 2023-02-07
  Administered 2023-02-07: 1000 mg via INTRAVENOUS

## 2023-02-07 MED ORDER — PROPOFOL 10 MG/ML IV BOLUS
INTRAVENOUS | Status: DC | PRN
  Administered 2023-02-07: 200 ug/kg/min via INTRAVENOUS

## 2023-02-07 SURGICAL SUPPLY — 65 items
BAG COUNTER SPONGE SURGICOUNT (BAG) IMPLANT
BAG ZIPLOCK 12X15 (MISCELLANEOUS) ×2 IMPLANT
BASEPLATE 24 10D FULL AUGM (Plate) IMPLANT
BIT DRILL AR 3 NS (BIT) IMPLANT
BLADE SAW SGTL 83.5X18.5 (BLADE) IMPLANT
COOLER ICEMAN CLASSIC (MISCELLANEOUS) IMPLANT
COVER BACK TABLE 60X90IN (DRAPES) ×2 IMPLANT
COVER SURGICAL LIGHT HANDLE (MISCELLANEOUS) ×2 IMPLANT
CUP SUT UNIV REVERS 36 NEUTRAL (Cup) IMPLANT
DERMABOND ADVANCED .7 DNX12 (GAUZE/BANDAGES/DRESSINGS) ×2 IMPLANT
DRAPE INCISE IOBAN 66X45 STRL (DRAPES) IMPLANT
DRAPE SHEET LG 3/4 BI-LAMINATE (DRAPES) ×2 IMPLANT
DRAPE SURG 17X11 SM STRL (DRAPES) ×2 IMPLANT
DRAPE SURG ORHT 6 SPLT 77X108 (DRAPES) ×4 IMPLANT
DRAPE U-SHAPE 47X51 STRL (DRAPES) ×2 IMPLANT
DRSG AQUACEL AG ADV 3.5X 6 (GAUZE/BANDAGES/DRESSINGS) IMPLANT
DRSG AQUACEL AG ADV 3.5X10 (GAUZE/BANDAGES/DRESSINGS) IMPLANT
DRSG TEGADERM 8X12 (GAUZE/BANDAGES/DRESSINGS) ×2 IMPLANT
DURAPREP 26ML APPLICATOR (WOUND CARE) ×2 IMPLANT
ELECT BLADE TIP CTD 4 INCH (ELECTRODE) ×2 IMPLANT
ELECT PENCIL ROCKER SW 15FT (MISCELLANEOUS) ×2 IMPLANT
ELECT REM PT RETURN 15FT ADLT (MISCELLANEOUS) ×2 IMPLANT
FACESHIELD WRAPAROUND (MASK) ×4
FACESHIELD WRAPAROUND OR TEAM (MASK) ×8 IMPLANT
GLENOID UNI REV MOD 24 +2 LAT (Joint) IMPLANT
GLENOSPHERE 36 +4 LAT/24 (Joint) IMPLANT
GLOVE BIO SURGEON STRL SZ7.5 (GLOVE) ×2 IMPLANT
GLOVE BIO SURGEON STRL SZ8 (GLOVE) ×2 IMPLANT
GLOVE SS BIOGEL STRL SZ 7 (GLOVE) ×2 IMPLANT
GLOVE SS BIOGEL STRL SZ 7.5 (GLOVE) ×2 IMPLANT
GOWN STRL SURGICAL XL XLNG (GOWN DISPOSABLE) ×4 IMPLANT
INSERT HUMERAL UNI REVERS 36 6 (Insert) IMPLANT
KIT BASIN OR (CUSTOM PROCEDURE TRAY) ×2 IMPLANT
KIT TURNOVER KIT A (KITS) IMPLANT
MANIFOLD NEPTUNE II (INSTRUMENTS) ×2 IMPLANT
NDL TAPERED W/ NITINOL LOOP (MISCELLANEOUS) IMPLANT
NEEDLE TAPERED W/ NITINOL LOOP (MISCELLANEOUS)
NS IRRIG 1000ML POUR BTL (IV SOLUTION) ×2 IMPLANT
PACK SHOULDER (CUSTOM PROCEDURE TRAY) ×2 IMPLANT
PAD ARMBOARD 7.5X6 YLW CONV (MISCELLANEOUS) ×2 IMPLANT
PAD COLD SHLDR WRAP-ON (PAD) IMPLANT
PIN SET MODULAR GLENOID SYSTEM (PIN) IMPLANT
POST MODULAR MGS BASEPLATE 25 (Post) IMPLANT
RESTRAINT HEAD UNIVERSAL NS (MISCELLANEOUS) ×2 IMPLANT
SCREW CENTRAL MOD 30MM (Screw) IMPLANT
SCREW PERI LOCK 5.5X16 (Screw) IMPLANT
SCREW PERI LOCK 5.5X24 (Screw) IMPLANT
SLING ARM FOAM STRAP LRG (SOFTGOODS) IMPLANT
SLING ARM FOAM STRAP MED (SOFTGOODS) IMPLANT
SMARTMIX MINI TOWER (MISCELLANEOUS)
SPONGE T-LAP 4X18 ~~LOC~~+RFID (SPONGE) IMPLANT
STEM HUMERAL UNI REVERS SZ9 (Stem) IMPLANT
SUCTION TUBE FRAZIER 12FR DISP (SUCTIONS) ×2 IMPLANT
SUT MNCRL AB 3-0 PS2 18 (SUTURE) ×2 IMPLANT
SUT MON AB 2-0 CT1 36 (SUTURE) ×2 IMPLANT
SUT VIC AB 1 CT1 36 (SUTURE) ×2 IMPLANT
SUTURE TAPE 1.3 40 TPR END (SUTURE) IMPLANT
SUTURETAPE 1.3 40 TPR END (SUTURE) ×1
SWAB COLLECTION DEVICE MRSA (MISCELLANEOUS) IMPLANT
SWAB CULTURE ESWAB REG 1ML (MISCELLANEOUS) IMPLANT
TOWEL OR 17X26 10 PK STRL BLUE (TOWEL DISPOSABLE) ×2 IMPLANT
TOWER SMARTMIX MINI (MISCELLANEOUS) IMPLANT
TUBE SUCTION HIGH CAP CLEAR NV (SUCTIONS) ×2 IMPLANT
TUBING CONNECTING 10 (TUBING) ×2 IMPLANT
WATER STERILE IRR 1000ML POUR (IV SOLUTION) ×2 IMPLANT

## 2023-02-07 NOTE — Transfer of Care (Signed)
Immediate Anesthesia Transfer of Care Note  Patient: Tara [name]  Procedure(s) Performed: Excision Left shoulder nodule; Conversion of left anatomic shoulder arthroplasty to reverse shoulder arthroplasty (Left: Shoulder)  Patient Location: PACU  Anesthesia Type:General  Level of Consciousness: drowsy  Airway & Oxygen Therapy: Patient Spontanous Breathing  Post-op Assessment: Report given to RN  Post vital signs: Reviewed and stable  Last Vitals:  Vitals Value Taken Time  BP 106/65 02/07/23 1005  Temp    Pulse 74 02/07/23 1008  Resp 21 02/07/23 1008  SpO2 98 % 02/07/23 1008  Vitals shown include unfiled device data.  Last Pain:  Vitals:   02/07/23 0622  TempSrc: Oral  PainSc:          Complications: No notable events documented.

## 2023-02-07 NOTE — Progress Notes (Signed)
Orthopedic Tech Progress Note Patient Details:  Tara [name] April 05, 1960 [id number]  Ortho Devices Type of Ortho Device: Sling immobilizer Ortho Device/Splint Location: left Ortho Device/Splint Interventions: Ordered, Application, Adjustment   Post Interventions Patient Tolerated: Well Instructions Provided: Adjustment of device, Care of device  Kizzie Fantasia 02/07/2023, 12:17 PM

## 2023-02-07 NOTE — Anesthesia Procedure Notes (Signed)
Anesthesia Regional Block: Interscalene brachial plexus block   Pre-Anesthetic Checklist: , timeout performed,  Correct Patient, Correct Site, Correct Laterality,  Correct Procedure, Correct Position, site marked,  Risks and benefits discussed,  Surgical consent,  Pre-op evaluation,  At surgeon's request and post-op pain management  Laterality: Left  Prep: chloraprep       Needles:  Injection technique: Single-shot  Needle Type: Echogenic Stimulator Needle     Needle Length: 5cm  Needle Gauge: 22     Additional Needles:   Procedures:, nerve stimulator,,,,,     Nerve Stimulator or Paresthesia:  Response: biceps flexion, 0.45 mA  Additional Responses:   Narrative:  Start time: 02/07/2023 7:10 AM End time: 02/07/2023 7:17 AM Injection made incrementally with aspirations every 5 mL.  Performed by: Personally  Anesthesiologist: Achille Rich, MD  Additional Notes: Functioning IV was confirmed and monitors were applied.  A 50mm 22ga Arrow echogenic stimulator needle was used. Sterile prep and drape,hand hygiene and sterile gloves were used.  Negative aspiration and negative test dose prior to incremental administration of local anesthetic. The patient tolerated the procedure well.  Ultrasound guidance: relevent anatomy identified, needle position confirmed, local anesthetic spread visualized around nerve(s), vascular puncture avoided.  Image printed for medical record.

## 2023-02-07 NOTE — Op Note (Signed)
02/07/2023  9:50 AM  PATIENT:   Tara [name]  62 y.o. female  PRE-OPERATIVE DIAGNOSIS:  Failed Left anatomic shoulder arthroplasty; painful nodule left shoulder  POST-OPERATIVE DIAGNOSIS: Same  PROCEDURE: #1 conversion of failed left shoulder anatomic arthroplasty to a reverse shoulder arthroplasty utilizing a press-fit size 9 Arthrex stem with a neutral metaphysis, +6 constrained polyethylene insert, 36/+4 glenosphere and a small/+2 baseplate.  #2 excision of subcutaneous nodule left shoulder consistent with fibrous tissue  SURGEON:  Zayla Agar, Vania Rea. M.D.  ASSISTANTS: Ralene Bathe, PA-C  Ralene Bathe, PA-C was utilized as an Geophysicist/field seismologist throughout this case, essential for help with positioning the patient, positioning extremity, tissue manipulation, implantation of the prosthesis, suture management, wound closure, and intraoperative decision-making.  ANESTHESIA:   General Endotracheal and interscalene block with Exparel   EBL: 100 cc  SPECIMEN: None  Drains: None   PATIENT DISPOSITION:  PACU - hemodynamically stable.    PLAN OF CARE: Discharge to home after PACU   Brief history:  Patient is a 62 year old female well-known to our practice after previous left shoulder anatomic arthroplasty performed approximately 2 years ago presenting with progressively increasing left shoulder pain with clinical evidence for instability of the implant and radiographs showing a high riding humeral head and subsequent ultrasound imaging confirming severe dysfunction of the rotator cuff musculature.  There is also some concerns regarding erosions beneath the glenoid.  With these multiple findings and her ongoing and progressive increasing pain and associated functional limitations she is brought to the operating at this time for planned left shoulder revision arthroplasty with planned conversion to reverse shoulder arthroplasty.  Preoperatively, I counseled the patient regarding treatment  options and risks versus benefits thereof.  Possible surgical complications were all reviewed including potential for bleeding, infection, neurovascular injury, persistent pain, loss of motion, anesthetic complication, failure of the implant, and possible need for additional surgery. They understand and accept and agrees with our planned procedure.   Procedure detail:  After undergoing routine preop evaluation the patient received prophylactic antibiotics and interscalene block with Exparel rel was established in the holding area by the anesthesia department.  Subsequently placed spine on the operating table and underwent the smooth induction of a general endotracheal anesthesia.  Placed into the beachchair position and appropriately padded and protected.  The left shoulder girdle region was sterilely prepped and draped in standard fashion.  Timeout was called.  Examination of the shoulder did reveal marked instability particularly posteriorly.  We approach the shoulder through the previous anterior incision approximately 10 cm in length.  Skin flaps were elevated and then we focus initially our attention to the area of the subcutaneous nodule that had been noted clinically and there was a little fibrous nodule approxi-2 mm in diameter at approximately the mid/proximal third junction of the incision on its inferior border.  This mass was excised.  We then opened the deltopectoral interval from proximal to distal using electrocautery and blunt and sharp dissection with electrocautery used for hemostasis.  Adhesions were then divided beneath the deltoid.  Conjoined tendon was identified and mobilized medially and the anterior soft tissues were then divided from proximal to distal towards the humeral metaphysis and this allowed [place] to evacuate joint fluid which was clear and yellow and pristine in appearance.  The subscapularis was nonviable and the soft tissue medially was then divided in a subperiosteal fashion  from the humeral neck and which ultimately allowed delivery of the humeral head through the wound.  Rondure was then  used to remove the soft tissue the margin of the humeral head and the head was then elevated with a osteotome.  The trunnion was then mobilized with our central cage screw removed preserving the vast majority of the metaphyseal bone.  A freshening cut was then made with an oscillating saw over the metaphysis removing an additional approximately 2 mm of bone and a metal cap was then placed over the cut proximal humeral surface.  We then exposed the glenoid and performed a circumferential capsular release removing soft tissues from the margin of the glenoid and the glenoid was grossly loose.  Theglenoid was then mobilized and removed which initially did leave behind the superior peg and central peg and these were then removed separately with the rondure and osteotome.  There was significant erosion of the glenoid vault but the marginal cortex of the glenoid was maintained intact.  This area was all copiously irrigated cleaned and a guidepin was then directed into the center of the glenoid and we did redirect our guidepin in a somewhat more posterior direction to correct for the parent retroversion and ultimately achieved good bony purchase with a 30 mm lag screw measurement.  The glenoid was then prepared using our central reamer and the bone was quite sclerotic achieving a solid base for the baseplate.  Preparation completed with a drill and tapped for a 30 mm lag screw.  Our baseplate was then assembled.  Vancomycin powder applied to the threads of the lag screw the baseplate was then inserted achieving excellent fit and fixation.  All of the peripheral locking screws were then placed using standard technique with excellent fixation.  A 36/+4 glenosphere was then impacted onto the baseplate and the central locking screw was placed.  We returned our attention back to the humeral metaphysis where we  open the canal by hand reaming and ultimately broached up to a size 9 stem in approximately 20 degrees of retroversion.  A neutral metaphyseal reaming guide was then used to prepare the metaphysis and I should mention that we also removed all of the previously placed suture material from the previous repair of the subscapularis at the index procedure.  A trial implant was then inserted and trial reduction showed good motion stability and soft tissue balance.  Trial was then removed.  A final implant was then assembled.  The canal was irrigated cleaned and dried and final implant was then seated after vancomycin powder applied into the canal.  Trial reductions were then performed and ultimately felt that a +6 poly spacer gives the best motion stability and soft tissue balance.  A +6 constrained poly was then impacted onto the implant.  Final reduction was performed which again showed excellent motion stability and soft tissue balance all much to our satisfaction.  Final irrigation was then completed.  Balance of viral vancomycin powder was then sprayed liberally without the deep soft tissue planes.  The deltopectoral interval was reapproximated with a series of figure-of-eight and with Vicryl sutures.  2-0 Monocryl used to close the subcu layer and intracuticular 3-0 Monocryl used to close the skin followed by Dermabond and Aquacel dressing.  Left arm was placed into a sling.  The patient was awakened, extubated, and taken to the recovery in stable condition.  Senaida Lange MD  Contact # [phone]

## 2023-02-07 NOTE — Anesthesia Procedure Notes (Signed)
Procedure Name: Intubation Date/Time: 02/07/2023 7:42 AM  Performed by: Randa Evens, CRNAPre-anesthesia Checklist: Patient identified, Emergency Drugs available, Suction available and Patient being monitored Patient Re-evaluated:Patient Re-evaluated prior to induction Oxygen Delivery Method: Circle System Utilized Preoxygenation: Pre-oxygenation with 100% oxygen Induction Type: IV induction Ventilation: Mask ventilation without difficulty Laryngoscope Size: Mac and 3 Grade View: Grade I Tube type: Oral Tube size: 7.0 mm Number of attempts: 1 Airway Equipment and Method: Stylet Placement Confirmation: ETT inserted through vocal cords under direct vision, positive ETCO2 and breath sounds checked- equal and bilateral Secured at: 22 cm Tube secured with: Tape Dental Injury: Teeth and Oropharynx as per pre-operative assessment

## 2023-02-07 NOTE — Evaluation (Signed)
Occupational Therapy Evaluation and Discharge Patient Details Name: Tara [name] MRN: [id number] DOB: 12/22/1960 Today's Date: 02/07/2023   History of Present Illness Pt is a 62 yo female s/p conversion of failed left shoulder anatomic arthroplasty to a reverse shoulder arthroplasty.   Clinical Impression   This 62 yo female admitted and underwent above presents to acute OT with all post op shoulder education completed following guidelines in orders. Pt has very supportive husband that will help her prn. No further OT needs, we will sign off.           Functional Status Assessment  Patient has had a recent decline in their functional status and demonstrates the ability to make significant improvements in function in a reasonable and predictable amount of time. (without further need for OT acutely, all education completed and post op handouts provided)  Equipment Recommendations  None recommended by OT       Precautions / Restrictions Precautions Precautions: Shoulder Type of Shoulder Precautions: If sitting in controlled environment, ok to come out of sling to give neck a break. Please sleep in it to protect until follow up in office.  OK to use operative arm for feeding, hygiene and ADLs. Ok to instruct Pendulums and lap slides as exercises. Ok to use operative arm within the following parameters for ADL purposes  New ROM (8/18) Ok for PROM, AAROM, AROM within pain tolerance and within the following ROM ER 20 ABD 45 FE 60   Please instruct Ice machine usage Shoulder Interventions: Shoulder sling/immobilizer Precaution Booklet Issued: Yes (comment) Required Braces or Orthoses: Sling Restrictions Weight Bearing Restrictions Per Provider Order: Yes LUE Weight Bearing Per Provider Order: Non weight bearing      Mobility Bed Mobility               General bed mobility comments: pt in recliner in post op    Transfers Overall transfer level: Independent                         Balance Overall balance assessment: Mild deficits observed, not formally tested (just post surgery)                                         ADL either performed or assessed with clinical judgement   ADL                                         General ADL Comments: educated on ice machine and husband place straps on patient     Vision Patient Visual Report: No change from baseline              Pertinent Vitals/Pain Pain Assessment Pain Assessment: Faces Faces Pain Scale: Hurts little more Pain Location: left shoulder Pain Descriptors / Indicators: Aching, Sore Pain Intervention(s): Limited activity within patient's tolerance, Monitored during session, Repositioned, Ice applied     Extremity/Trunk Assessment Upper Extremity Assessment Upper Extremity Assessment: Right hand dominant;LUE deficits/detail LUE Deficits / Details: shoulder sx this admission, pt reports block wearing off at shoulder (pain wise), no AROM at present LUE Coordination: decreased fine motor;decreased gross motor           Communication Communication Communication: No apparent difficulties   Cognition Arousal: Alert Behavior During Therapy:  WFL for tasks assessed/performed Overall Cognitive Status: Within Functional Limits for tasks assessed                                             Shoulder Instructions Shoulder Instructions Donning/doffing shirt without moving shoulder: Caregiver independent with task Method for sponge bathing under operated UE: Caregiver independent with task Donning/doffing sling/immobilizer: Caregiver independent with task Correct positioning of sling/immobilizer: Caregiver independent with task Pendulum exercises (written home exercise program): Independent ROM for elbow, wrist and digits of operated UE:  (educated and handout given, block not worn off yet so pt could not yet do these exercises) Sling  wearing schedule (on at all times/off for ADL's): Independent Proper positioning of operated UE when showering: Independent;Minimal assistance Dressing change:  (NA) Positioning of UE while sleeping: Independent    Home Living Family/patient expects to be discharged to:: Private residence Living Arrangements: Spouse/significant other Available Help at Discharge: Available 24 hours/day;Family                                            OT Problem List: Decreased strength;Decreased range of motion;Pain         OT Goals(Current goals can be found in the care plan section) Acute Rehab OT Goals Patient Stated Goal: home today         AM-PAC OT "6 Clicks" Daily Activity     Outcome Measure Help from another person eating meals?: A Little Help from another person taking care of personal grooming?: A Little Help from another person toileting, which includes using toliet, bedpan, or urinal?: A Little Help from another person bathing (including washing, rinsing, drying)?: A Little Help from another person to put on and taking off regular upper body clothing?: A Lot Help from another person to put on and taking off regular lower body clothing?: A Lot 6 Click Score: 16   End of Session Nurse Communication:  (pt ready to go from OT standpoint)  Activity Tolerance: Patient tolerated treatment well Patient left: in chair;with family/visitor present  OT Visit Diagnosis: Pain Pain - Right/Left: Left Pain - part of body: Shoulder                Time: 1610-9604 OT Time Calculation (min): 36 min Charges:  OT General Charges $OT Visit: 1 Visit OT Evaluation $OT Eval Moderate Complexity: 1 Mod OT Treatments $Therapeutic Exercise: 8-22 mins Lindon Romp OT Acute Rehabilitation Services Office [phone]    Evette Georges 02/07/2023, 1:42 PM

## 2023-02-07 NOTE — H&P (Signed)
[facility]    Chief Complaint: Failed Left anatomic shoulder arthroplasty; painful nodule left shoulder HPI: The patient is a 62 y.o. female well-known to our practice after previous right shoulder reverse arthroplasty performed back in 2022, and a left shoulder anatomic arthroplasty performed in January 2023.  Unfortunately her left shoulder has now become painful with decreasing functional capabilities related to failure of the rotator cuff.  She is brought to the operating room today for planned conversion of left shoulder anatomic arthroplasty to reverse shoulder arthroplasty.  Additionally, clinical exam demonstrates a painful nodule at the central aspect of her left shoulder incision we do plan for excision of this mass.  Past Medical History:  Diagnosis Date   Anemia    Arthritis    Hyperlipidemia    Insomnia    Mitral valve prolapse    PONV (postoperative nausea and vomiting)       Past Surgical History:  Procedure Laterality Date   AUGMENTATION MAMMAPLASTY Bilateral    Dec 2019   BREAST BIOPSY Right    axillary node benign   KNEE SURGERY  1988   right   SHOULDER ARTHROSCOPY WITH LABRAL REPAIR Right 08/28/2012   Procedure: RIGHT SHOULDER ARTHROSCOPY WITH LABRAL DEBRIDEMENT, ASPIRATION AND DECOMPRESSION OF PARALABRAL CYST;  Surgeon: Senaida Lange, MD;  Location: [facility] OR;  Service: Orthopedics;  Laterality: Right;   TOTAL SHOULDER ARTHROPLASTY Right 03/24/2020   Procedure: RIGHT REVERSE SHOULDER ARTHROPLASTY;  Surgeon: Francena Hanly, MD;  Location: [facility];  Service: Orthopedics;  Laterality: Right;    TOTAL SHOULDER ARTHROPLASTY Left 02/23/2021   Procedure: TOTAL SHOULDER ARTHROPLASTY;  Surgeon: Francena Hanly, MD;  Location: [facility];  Service: Orthopedics;  Laterality: Left;    Family History  Problem Relation Age of Onset   Stroke Mother    Heart disease Mother    Heart disease Father    Diabetes Sister    Heart disease Sister    Diabetes Sister    Heart attack  Brother 37   Diabetes Brother    Heart disease Brother    Sudden death Neg Hx    Hypertension Neg Hx    Hyperlipidemia Neg Hx    Breast cancer Neg Hx    Colon cancer Neg Hx    Esophageal cancer Neg Hx    Rectal cancer Neg Hx    Stomach cancer Neg Hx     Social History:  reports that she has never smoked. She has never been exposed to tobacco smoke. She has never used smokeless tobacco. She reports that she does not drink alcohol and does not use drugs.  BMI: Estimated body mass index is 20.86 kg/m as calculated from the following:   Height as of 02/01/23: 5\' 10"  (1.778 m).   Weight as of this encounter: 66 kg.  Lab Results  Component Value Date   ALBUMIN 4.5 10/28/2020   Diabetes: Patient does not have a diagnosis of diabetes.     Smoking Status:   reports that she has never smoked. She has never been exposed to tobacco smoke. She has never used smokeless tobacco.     Facility-Administered Medications Prior to Admission  Medication Dose Route Frequency Provider Last Rate Last Admin   [START ON 06/30/2023] denosumab (PROLIA) injection 60 mg  60 mg Subcutaneous Once Shamleffer, Konrad Dolores, MD       Medications Prior to Admission  Medication Sig Dispense Refill   AMBULATORY NON FORMULARY MEDICATION Medication Name: Mindful Evening Cocoa Mix (Melatonin 3 mg,  Theanine 5 mg, GABA 100 mg) (Patient taking differently: Take 1 Scoop by mouth at bedtime. Medication Name: Mindful Evening Cocoa Mix (Melatonin 3 mg, Theanine 5 mg, GABA 100 mg))     aspirin EC 81 MG tablet Take 1 tablet (81 mg total) by mouth daily. Swallow whole. 90 tablet 3   calcium carbonate (CALCIUM 600) 600 MG TABS tablet Take 600-1,200 mg by mouth daily as needed (calcium deficiency). Based on diet intake to achieve 1200 mg daily     Cholecalciferol (VITAMIN D) 50 MCG (2000 UT) tablet Take 2,000 Units by mouth daily.     Efinaconazole 10 % SOLN Apply 1 drop topically daily. (Patient taking differently: Apply 1  drop topically daily as needed (Toe Nail Fungus).) 4 mL 11   hydroquinone 4 % cream Apply 1 Application topically to affected areas 2 (two) times daily. (Patient taking differently: Apply 1 Application topically daily.) 56.7 g 3   MAGNESIUM GLYCINATE PO Take 500 mg by mouth daily. Chelated, supplies 50 mg of magnesium     Multiple Vitamin (MULTIVITAMIN WITH MINERALS) TABS Take 1 tablet by mouth daily.     Omega-3 Fatty Acids (FISH OIL) 1200 MG CAPS Take 1,200 mg by mouth daily. Provides 600 mg Omega-3 fatty acids     Psyllium Husk POWD Take 1 Dose by mouth daily. 1 dose = 1 to 2 tablespoons     rosuvastatin (CRESTOR) 5 MG tablet Take 1 tablet (5 mg total) by mouth daily. 90 tablet 3   tretinoin (RETIN-A) 0.05 % cream Apply topically at bedtime. (Patient taking differently: Apply 1 Application topically at bedtime.) 45 g 2   cyclobenzaprine (FLEXERIL) 10 MG tablet Take 1 tablet by mouth every 6 to 8 hours as needed for spasms. 30 tablet 0   denosumab (PROLIA) 60 MG/ML SOSY injection Inject 60 mg into the skin every 6 (six) months.     gabapentin (NEURONTIN) 300 MG capsule Take 1 capsule (300 mg total) by mouth 2 (two) times daily. 60 capsule 0   naproxen (NAPROSYN) 500 MG tablet Take 1 tablet (500 mg total) by mouth 2 (two) times daily. 60 tablet 1   ondansetron (ZOFRAN-ODT) 4 MG disintegrating tablet Take 1 tablet by mouth every 6 hours as needed. 30 tablet 0   traMADol (ULTRAM) 50 MG tablet Take 1 tablet by mouth every 4 to 6 hours as needed post-op pain 40 tablet 0     Physical Exam: Left shoulder incision is well-healed.  She does have a firm nodule over the central aspect of her anterior incision consistent with fibrous tissue.  This is much less tender currently than it has been previously.  She otherwise demonstrates severely painful and guarded motion with global weakness is noted at her recent office visits.  Plain radiographs confirm a high riding humeral head and changes consistent with  catastrophic loss of rotator cuff function.  Vitals  Temp:  [97.9 F (36.6 C)] 97.9 F (36.6 C) (12/19 0622) Pulse Rate:  [55] 55 (12/19 0622) Resp:  [16] 16 (12/19 0622) BP: (123)/(90) 123/90 (12/19 0622) SpO2:  [100 %] 100 % (12/19 0622) Weight:  [03 kg] 66 kg (12/19 0550)  Assessment/Plan  Impression: Failed Left anatomic shoulder arthroplasty; painful nodule left shoulder  Plan of Action: Procedure(s): Excision Left shoulder nodule; Conversion of left anatomic shoulder arthroplasty to reverse shoulder arthroplasty  Gladie Gravette M Bowie Doiron 02/07/2023, 6:23 AM Contact # [phone]

## 2023-02-07 NOTE — Discharge Instructions (Signed)

## 2023-02-08 ENCOUNTER — Encounter (HOSPITAL_COMMUNITY): Payer: Self-pay | Admitting: Orthopedic Surgery

## 2023-02-08 NOTE — Anesthesia Postprocedure Evaluation (Signed)
Anesthesia Post Note  Patient: Tara [name]  Procedure(s) Performed: Excision Left shoulder nodule; Conversion of left anatomic shoulder arthroplasty to reverse shoulder arthroplasty (Left: Shoulder)     Patient location during evaluation: PACU Anesthesia Type: General and Regional Level of consciousness: awake and alert Pain management: pain level controlled Vital Signs Assessment: post-procedure vital signs reviewed and stable Respiratory status: spontaneous breathing, nonlabored ventilation, respiratory function stable and patient connected to nasal cannula oxygen Cardiovascular status: blood pressure returned to baseline and stable Postop Assessment: no apparent nausea or vomiting Anesthetic complications: no   No notable events documented.  Last Vitals:  Vitals:   02/07/23 1145 02/07/23 1200  BP: 137/70 108/78  Pulse: 63 (!) 57  Resp:    Temp:    SpO2: 98% 97%    Last Pain:  Vitals:   02/07/23 1200  TempSrc:   PainSc: 0-No pain                 Deantre Bourdon S

## 2023-02-14 NOTE — Telephone Encounter (Signed)
Forwarding to Dr. Katrinka Blazing to review and advise.

## 2023-02-18 DIAGNOSIS — Z96612 Presence of left artificial shoulder joint: Secondary | ICD-10-CM | POA: Diagnosis not present

## 2023-02-18 DIAGNOSIS — Z471 Aftercare following joint replacement surgery: Secondary | ICD-10-CM | POA: Diagnosis not present

## 2023-02-25 ENCOUNTER — Ambulatory Visit (HOSPITAL_BASED_OUTPATIENT_CLINIC_OR_DEPARTMENT_OTHER): Payer: 59 | Admitting: Physical Therapy

## 2023-02-27 ENCOUNTER — Encounter: Payer: Self-pay | Admitting: Cardiovascular Disease

## 2023-02-28 ENCOUNTER — Ambulatory Visit (HOSPITAL_BASED_OUTPATIENT_CLINIC_OR_DEPARTMENT_OTHER): Payer: 59 | Admitting: Physical Therapy

## 2023-03-01 ENCOUNTER — Encounter: Payer: Self-pay | Admitting: Nurse Practitioner

## 2023-03-01 ENCOUNTER — Ambulatory Visit: Payer: 59 | Admitting: Nurse Practitioner

## 2023-03-01 ENCOUNTER — Other Ambulatory Visit (HOSPITAL_COMMUNITY): Payer: Self-pay

## 2023-03-01 VITALS — BP 128/76 | HR 60 | Temp 98.0°F | Resp 18 | Ht 70.0 in | Wt 145.0 lb

## 2023-03-01 DIAGNOSIS — J069 Acute upper respiratory infection, unspecified: Secondary | ICD-10-CM

## 2023-03-01 DIAGNOSIS — M1990 Unspecified osteoarthritis, unspecified site: Secondary | ICD-10-CM

## 2023-03-01 DIAGNOSIS — M150 Primary generalized (osteo)arthritis: Secondary | ICD-10-CM

## 2023-03-01 DIAGNOSIS — E871 Hypo-osmolality and hyponatremia: Secondary | ICD-10-CM

## 2023-03-01 DIAGNOSIS — R6883 Chills (without fever): Secondary | ICD-10-CM | POA: Diagnosis not present

## 2023-03-01 DIAGNOSIS — R059 Cough, unspecified: Secondary | ICD-10-CM | POA: Diagnosis not present

## 2023-03-01 DIAGNOSIS — J029 Acute pharyngitis, unspecified: Secondary | ICD-10-CM | POA: Diagnosis not present

## 2023-03-01 DIAGNOSIS — M810 Age-related osteoporosis without current pathological fracture: Secondary | ICD-10-CM

## 2023-03-01 LAB — POCT INFLUENZA A/B
Influenza A, POC: NEGATIVE
Influenza B, POC: NEGATIVE

## 2023-03-01 LAB — POC COVID19 BINAXNOW: SARS Coronavirus 2 Ag: NEGATIVE

## 2023-03-01 LAB — POCT RAPID STREP A (OFFICE): Rapid Strep A Screen: NEGATIVE

## 2023-03-01 MED ORDER — AMOXICILLIN-POT CLAVULANATE 875-125 MG PO TABS
1.0000 | ORAL_TABLET | Freq: Two times a day (BID) | ORAL | 0 refills | Status: AC
  Filled 2023-03-01: qty 14, 7d supply, fill #0

## 2023-03-01 MED ORDER — AMOXICILLIN-POT CLAVULANATE 875-125 MG PO TABS
1.0000 | ORAL_TABLET | Freq: Two times a day (BID) | ORAL | 0 refills | Status: DC
  Filled 2023-03-01: qty 10, 5d supply, fill #0

## 2023-03-01 MED ORDER — BENZONATATE 100 MG PO CAPS
100.0000 mg | ORAL_CAPSULE | Freq: Three times a day (TID) | ORAL | 0 refills | Status: DC | PRN
  Filled 2023-03-01: qty 15, 5d supply, fill #0

## 2023-03-01 MED ORDER — GUAIFENESIN ER 600 MG PO TB12
600.0000 mg | ORAL_TABLET | Freq: Two times a day (BID) | ORAL | 0 refills | Status: AC | PRN
  Filled 2023-03-01: qty 10, 5d supply, fill #0

## 2023-03-01 NOTE — Assessment & Plan Note (Signed)
 Naproxen, tramadol S/p R shoulder surgery, in sling.

## 2023-03-01 NOTE — Patient Instructions (Signed)
 Labs Augmentin x 7 days, Mucinex, Tessalon x 5 days Call if no improvement. Marland Kitchen

## 2023-03-01 NOTE — Assessment & Plan Note (Signed)
 Taking Prolia

## 2023-03-01 NOTE — Assessment & Plan Note (Signed)
 Naproxen, tramadol, s/p left shoulder surgery, in sling

## 2023-03-01 NOTE — Assessment & Plan Note (Addendum)
 Augmentin 875/125mg  bid x 7days Mucinex 600mg  bid x 3 days.  Tessaslon 100mg  tid x 3 days CBC/diff, CMP/eGFR

## 2023-03-01 NOTE — Progress Notes (Signed)
 Location:   Clinic [facility]   Place of Service:    Provider: Larwance Hark NP  Code Status: DNR Goals of Care:     03/04/2023   10:27 AM  Advanced Directives  Does Patient Have a Medical Advance Directive? Yes  Type of Estate Agent of [place];Living will  Copy of Healthcare Power of Attorney in Chart? Yes - validated most recent copy scanned in chart (See row information)     Chief Complaint  Patient presents with   Acute Visit    Sick since Sunday and congestion and drainage.    HPI: Patient is a 63 y.o. female seen today for nasal congestion, sore throat,  yellow drainage, chills, cough x 5 days, afebrile, negative COVID, Flu, Strep, denied chest pain or SOB  OA, Naproxen , tramadol   OP, Prolia   Hyponatremia, Na 132  Past Medical History:  Diagnosis Date   Anemia    Arthritis    Hyperlipidemia    Insomnia    Mitral valve prolapse    PONV (postoperative nausea and vomiting)     Past Surgical History:  Procedure Laterality Date   AUGMENTATION MAMMAPLASTY Bilateral    Dec 2019   BREAST BIOPSY Right    axillary node benign   KNEE SURGERY  1988   right   SHOULDER ARTHROSCOPY WITH LABRAL REPAIR Right 08/28/2012   Procedure: RIGHT SHOULDER ARTHROSCOPY WITH LABRAL DEBRIDEMENT, ASPIRATION AND DECOMPRESSION OF PARALABRAL CYST;  Surgeon: Franky CHRISTELLA Pointer, MD;  Location: [facility];  Service: Orthopedics;  Laterality: Right;   TOTAL SHOULDER ARTHROPLASTY Right 03/24/2020   Procedure: RIGHT REVERSE SHOULDER ARTHROPLASTY;  Surgeon: Pointer Franky, MD;  Location: [facility];  Service: Orthopedics;  Laterality: Right;    TOTAL SHOULDER ARTHROPLASTY Left 02/23/2021   Procedure: TOTAL SHOULDER ARTHROPLASTY;  Surgeon: Pointer Franky, MD;  Location: [facility];  Service: Orthopedics;  Laterality: Left;   TOTAL SHOULDER REVISION Left 02/07/2023   Procedure: Excision Left shoulder nodule; Conversion of left anatomic shoulder arthroplasty to reverse shoulder arthroplasty;  Surgeon:  Pointer Franky, MD;  Location: [facility];  Service: Orthopedics;  Laterality: Left;    Allergies  Allergen Reactions   Codeine Nausea And Vomiting   Hydrocodone Nausea And Vomiting   Triple Antibiotic Pain Relief [Neomy-Bacit-Polymyx-Pramoxine] Other (See Comments)    Burned skin   Triple Antibiotic W/Hydrocortisone  [Bacitra-Neomycin-Polymyxin-Hc]     Burned skin    Contrast Media [Iodinated Contrast Media] Hives    After CT scan with contrast, pt had MRI without contrast. Pt informed MRI Tech that she had a rash on her legs. MRI Tech spoke with Radiologist after MRI was completed. Per Rad, since patient was here x 30 minutes after CT contrast and was doing fine, with rash fading, pt was allowed to leave facility. Alert and oriented. jmh    Allergies as of 03/01/2023       Reactions   Codeine Nausea And Vomiting   Hydrocodone Nausea And Vomiting   Triple Antibiotic Pain Relief [neomy-bacit-polymyx-pramoxine] Other (See Comments)   Burned skin   Triple Antibiotic W/hydrocortisone  [bacitra-neomycin-polymyxin-hc]    Burned skin    Contrast Media [iodinated Contrast Media] Hives   After CT scan with contrast, pt had MRI without contrast. Pt informed MRI Tech that she had a rash on her legs. MRI Tech spoke with Radiologist after MRI was completed. Per Rad, since patient was here x 30 minutes after CT contrast and was doing fine, with rash fading, pt was allowed to leave  facility. Alert and oriented. jmh        Medication List        Accurate as of March 01, 2023 11:59 PM. If you have any questions, ask your nurse or doctor.          AMBULATORY NON FORMULARY MEDICATION Medication Name: Mindful Evening Cocoa Mix (Melatonin 3 mg, Theanine 5 mg, GABA 100 mg) What changed:  how much to take how to take this when to take this   amoxicillin -clavulanate 875-125 MG tablet Commonly known as: AUGMENTIN  Take 1 tablet by mouth 2 (two) times daily for 7 days. Started by: Vashawn Ekstein X Heliodoro Domagalski    aspirin  EC 81 MG tablet Take 1 tablet (81 mg total) by mouth daily. Swallow whole.   benzonatate  100 MG capsule Commonly known as: Tessalon  Perles Take 1 capsule (100 mg total) by mouth 3 (three) times daily as needed for cough. Started by: Savvy Peeters X Kamy Poinsett   Calcium  600 600 MG Tabs tablet Generic drug: calcium  carbonate Take 600-1,200 mg by mouth daily as needed (calcium  deficiency). Based on diet intake to achieve 1200 mg daily   denosumab  60 MG/ML Sosy injection Commonly known as: PROLIA  Inject 60 mg into the skin every 6 (six) months.   Efinaconazole  10 % Soln Apply 1 drop topically daily. What changed:  when to take this reasons to take this   Fish Oil  1200 MG Caps Take 1,200 mg by mouth daily. Provides 600 mg Omega-3 fatty acids   guaiFENesin  600 MG 12 hr tablet Commonly known as: Mucinex  Take 1 tablet (600 mg total) by mouth 2 (two) times daily as needed for up to 5 days. Started by: Tashawn Greff X Aylah Yeary   hydroquinone  4 % cream Apply 1 Application topically to affected areas 2 (two) times daily. What changed: when to take this   MAGNESIUM GLYCINATE PO Take 500 mg by mouth daily. Chelated, supplies 50 mg of magnesium   multivitamin with minerals Tabs tablet Take 1 tablet by mouth daily.   naproxen  500 MG tablet Commonly known as: Naprosyn  Take 1 tablet (500 mg total) by mouth 2 (two) times daily.   ondansetron  4 MG disintegrating tablet Commonly known as: ZOFRAN -ODT Take 1 tablet by mouth every 6 hours as needed.   Psyllium Husk Powd Take 1 Dose by mouth daily. 1 dose = 1 to 2 tablespoons   rosuvastatin  5 MG tablet Commonly known as: CRESTOR  Take 1 tablet (5 mg total) by mouth daily.   traMADol  50 MG tablet Commonly known as: ULTRAM  Take 1 tablet by mouth every 4 to 6 hours as needed post-op pain   tretinoin  0.05 % cream Commonly known as: RETIN-A  Apply topically at bedtime. What changed: how much to take   Vitamin D  50 MCG (2000 UT) tablet Take 2,000 Units  by mouth daily.        Review of Systems:  Review of Systems  Constitutional:  Positive for appetite change, chills and fatigue. Negative for fever.  HENT:  Positive for congestion, rhinorrhea, sore throat and voice change. Negative for sinus pressure.   Eyes:  Negative for visual disturbance.  Respiratory:  Positive for cough. Negative for wheezing.   Cardiovascular:  Negative for chest pain and leg swelling.  Gastrointestinal:  Negative for abdominal pain, diarrhea, nausea and vomiting.  Genitourinary:  Negative for dysuria and urgency.  Musculoskeletal:  Positive for arthralgias.  Skin:  Negative for color change.  Neurological:  Negative for speech difficulty, weakness and headaches.  Psychiatric/Behavioral:  Positive for  sleep disturbance. Negative for behavioral problems. The patient is not nervous/anxious.     Health Maintenance  Topic Date Due   Pneumococcal Vaccine 42-61 Years old (1 of 2 - PCV) Never done   DEXA SCAN  10/06/2022   COVID-19 Vaccine (4 - 2024-25 season) 10/21/2022   MAMMOGRAM  10/11/2024   DTaP/Tdap/Td (2 - Td or Tdap) 02/20/2025   Cervical Cancer Screening (HPV/Pap Cotest)  05/06/2025   Colonoscopy  06/07/2032   INFLUENZA VACCINE  Completed   Hepatitis C Screening  Completed   HIV Screening  Completed   Zoster Vaccines- Shingrix  Completed   HPV VACCINES  Aged Out    Physical Exam: Vitals:   03/01/23 0940  BP: 128/76  Pulse: 60  Resp: 18  Temp: 98 F (36.7 C)  SpO2: 99%  Weight: 145 lb (65.8 kg)  Height: 5' 10 (1.778 m)   Body mass index is 20.81 kg/m. Physical Exam Vitals and nursing note reviewed.  Constitutional:      Comments: weak  HENT:     Head: Normocephalic and atraumatic.     Nose: Congestion and rhinorrhea present.     Mouth/Throat:     Pharynx: Posterior oropharyngeal erythema present. No oropharyngeal exudate.  Eyes:     Extraocular Movements: Extraocular movements intact.     Conjunctiva/sclera: Conjunctivae  normal.     Pupils: Pupils are equal, round, and reactive to light.  Cardiovascular:     Rate and Rhythm: Normal rate and regular rhythm.  Pulmonary:     Effort: Pulmonary effort is normal.     Breath sounds: Rhonchi present. No wheezing or rales.  Abdominal:     General: Bowel sounds are normal.     Palpations: Abdomen is soft.     Tenderness: There is no abdominal tenderness.  Musculoskeletal:        General: Tenderness present.     Cervical back: Normal range of motion and neck supple.     Right lower leg: No edema.     Left lower leg: No edema.     Comments: Left shoulder.   Skin:    General: Skin is warm and dry.  Neurological:     General: No focal deficit present.     Mental Status: She is alert and oriented to person, place, and time. Mental status is at baseline.     Gait: Gait normal.  Psychiatric:        Mood and Affect: Mood normal.        Behavior: Behavior normal.        Thought Content: Thought content normal.        Judgment: Judgment normal.     Labs reviewed: Basic Metabolic Panel: Recent Labs    05/07/22 0000 12/31/22 1533 02/01/23 1409 03/01/23 1030  NA 138  --  132* 137  K 4.1  --  3.7 4.3  CL 99  --  99 100  CO2 27  --  27 29  GLUCOSE 78  --  86 98  BUN 18  --  26* 18  CREATININE 0.56  --  0.56 0.53  CALCIUM  9.5 9.4 9.0 8.9  PHOS  --  3.3  --   --   TSH  --  1.49  --   --    Liver Function Tests: Recent Labs    05/07/22 0000 03/01/23 1030  AST 26 25  ALT 19 21  BILITOT 0.4 0.4  PROT 7.2 6.2   No results for input(s):  LIPASE, AMYLASE in the last 8760 hours. No results for input(s): AMMONIA in the last 8760 hours. CBC: Recent Labs    05/07/22 0000 02/01/23 1409 03/01/23 1030  WBC 6.1 5.1 10.2  NEUTROABS 4,490  --  7,783  HGB 15.0 13.9 12.9  HCT 43.9 39.7 38.7  MCV 97.6 99.5 98.2  PLT 262 184 256   Lipid Panel: Recent Labs    05/07/22 0000  CHOL 154  HDL 65  LDLCALC 77  TRIG 50  CHOLHDL 2.4   No results  found for: HGBA1C  Procedures since last visit: No results found.  Assessment/Plan  Upper respiratory infection Augmentin  875/125mg  bid x 7days Mucinex  600mg  bid x 3 days.  Tessaslon 100mg  tid x 3 days CBC/diff, CMP/eGFR  OA (osteoarthritis) Naproxen , tramadol , s/p left shoulder surgery, in sling   Hyponatremia Na 132 02/01/24 Update CMP/eGFR  Primary osteoarthritis involving multiple joints Naproxen , tramadol  S/p R shoulder surgery, in sling.   Osteoporosis without current pathological fracture Taking Prolia .    Labs/tests ordered: CBC/diff, CMP/eGFR Next appt:  05/09/2023

## 2023-03-01 NOTE — Assessment & Plan Note (Signed)
 Na 132 02/01/24 Update CMP/eGFR

## 2023-03-01 NOTE — Telephone Encounter (Signed)
Patient has been scheduled an appointment

## 2023-03-02 LAB — COMPLETE METABOLIC PANEL WITH GFR
AG Ratio: 1.8 (calc) (ref 1.0–2.5)
ALT: 21 U/L (ref 6–29)
AST: 25 U/L (ref 10–35)
Albumin: 4 g/dL (ref 3.6–5.1)
Alkaline phosphatase (APISO): 72 U/L (ref 37–153)
BUN: 18 mg/dL (ref 7–25)
CO2: 29 mmol/L (ref 20–32)
Calcium: 8.9 mg/dL (ref 8.6–10.4)
Chloride: 100 mmol/L (ref 98–110)
Creat: 0.53 mg/dL (ref 0.50–1.05)
Globulin: 2.2 g/dL (ref 1.9–3.7)
Glucose, Bld: 98 mg/dL (ref 65–139)
Potassium: 4.3 mmol/L (ref 3.5–5.3)
Sodium: 137 mmol/L (ref 135–146)
Total Bilirubin: 0.4 mg/dL (ref 0.2–1.2)
Total Protein: 6.2 g/dL (ref 6.1–8.1)
eGFR: 105 mL/min/{1.73_m2} (ref >=60)

## 2023-03-02 LAB — CBC WITH DIFFERENTIAL/PLATELET
Absolute Lymphocytes: 1040 {cells}/uL (ref 850–3900)
Absolute Monocytes: 1132 {cells}/uL — ABNORMAL HIGH (ref 200–950)
Basophils Absolute: 41 {cells}/uL (ref 0–200)
Basophils Relative: 0.4 %
Eosinophils Absolute: 204 {cells}/uL (ref 15–500)
Eosinophils Relative: 2 %
HCT: 38.7 % (ref 35.0–45.0)
Hemoglobin: 12.9 g/dL (ref 11.7–15.5)
MCH: 32.7 pg (ref 27.0–33.0)
MCHC: 33.3 g/dL (ref 32.0–36.0)
MCV: 98.2 fL (ref 80.0–100.0)
MPV: 10.2 fL (ref 7.5–12.5)
Monocytes Relative: 11.1 %
Neutro Abs: 7783 {cells}/uL (ref 1500–7800)
Neutrophils Relative %: 76.3 %
Platelets: 256 10*3/uL (ref 140–400)
RBC: 3.94 10*6/uL (ref 3.80–5.10)
RDW: 11.4 % (ref 11.0–15.0)
Total Lymphocyte: 10.2 %
WBC: 10.2 10*3/uL (ref 3.8–10.8)

## 2023-03-04 ENCOUNTER — Other Ambulatory Visit: Payer: Self-pay

## 2023-03-04 ENCOUNTER — Encounter (HOSPITAL_BASED_OUTPATIENT_CLINIC_OR_DEPARTMENT_OTHER): Payer: Self-pay | Admitting: Physical Therapy

## 2023-03-04 ENCOUNTER — Ambulatory Visit (HOSPITAL_BASED_OUTPATIENT_CLINIC_OR_DEPARTMENT_OTHER): Payer: 59 | Attending: Orthopedic Surgery | Admitting: Physical Therapy

## 2023-03-04 DIAGNOSIS — M25612 Stiffness of left shoulder, not elsewhere classified: Secondary | ICD-10-CM | POA: Diagnosis not present

## 2023-03-04 DIAGNOSIS — M25512 Pain in left shoulder: Secondary | ICD-10-CM | POA: Diagnosis not present

## 2023-03-04 DIAGNOSIS — Z96612 Presence of left artificial shoulder joint: Secondary | ICD-10-CM | POA: Insufficient documentation

## 2023-03-04 DIAGNOSIS — Z471 Aftercare following joint replacement surgery: Secondary | ICD-10-CM | POA: Insufficient documentation

## 2023-03-04 NOTE — Therapy (Addendum)
 OUTPATIENT PHYSICAL THERAPY UPPER EXTREMITY EVALUATION   Patient Name: Tara [name] MRN: [id number] DOB:30-Mar-1960, 63 y.o., female Today's Date: 03/05/2023  END OF SESSION:  PT End of Session - 03/05/23 0909     Visit Number 1    Number of Visits 24    Date for PT Re-Evaluation 05/28/23    PT Start Time 1015    PT Stop Time 1058    PT Time Calculation (min) 43 min    Activity Tolerance Patient tolerated treatment well    Behavior During Therapy [facility] for tasks assessed/performed             Past Medical History:  Diagnosis Date   Anemia    Arthritis    Hyperlipidemia    Insomnia    Mitral valve prolapse    PONV (postoperative nausea and vomiting)    Past Surgical History:  Procedure Laterality Date   AUGMENTATION MAMMAPLASTY Bilateral    Dec 2019   BREAST BIOPSY Right    axillary node benign   KNEE SURGERY  1988   right   SHOULDER ARTHROSCOPY WITH LABRAL REPAIR Right 08/28/2012   Procedure: RIGHT SHOULDER ARTHROSCOPY WITH LABRAL DEBRIDEMENT, ASPIRATION AND DECOMPRESSION OF PARALABRAL CYST;  Surgeon: Franky CHRISTELLA Pointer, MD;  Location: [facility];  Service: Orthopedics;  Laterality: Right;   TOTAL SHOULDER ARTHROPLASTY Right 03/24/2020   Procedure: RIGHT REVERSE SHOULDER ARTHROPLASTY;  Surgeon: Pointer Franky, MD;  Location: [facility];  Service: Orthopedics;  Laterality: Right;    TOTAL SHOULDER ARTHROPLASTY Left 02/23/2021   Procedure: TOTAL SHOULDER ARTHROPLASTY;  Surgeon: Pointer Franky, MD;  Location: [facility];  Service: Orthopedics;  Laterality: Left;   TOTAL SHOULDER REVISION Left 02/07/2023   Procedure: Excision Left shoulder nodule; Conversion of left anatomic shoulder arthroplasty to reverse shoulder arthroplasty;  Surgeon: Pointer Franky, MD;  Location: [facility];  Service: Orthopedics;  Laterality: Left;   Patient Active Problem List   Diagnosis Date Noted   Upper respiratory infection 03/01/2023   OA (osteoarthritis) 03/01/2023   Hyponatremia 03/01/2023    Hypercalciuria 02/02/2023   Neurocognitive deficits 07/07/2021   DDD (degenerative disc disease), cervical 04/13/2021   Osteoporosis without current pathological fracture 10/28/2020   Nonallopathic lesion of sacral region 02/26/2020   Hot flashes 02/23/2019   Other insomnia 02/23/2019   Primary osteoarthritis involving multiple joints 02/23/2019   Cystic acne vulgaris 02/23/2019   Bunion of right foot 02/23/2019   Varicose 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 02/23/2019   Multiple atypical nevi 02/23/2019   Polyarthralgia 08/25/2014   Mitral valve prolapse 03/03/2009    PCP: Roxan [name]   REFERRING PROVIDER: JONETTA Franky Supple  REFERRING DIAG:  Diagnosis  [phone] (ICD-10-CM) - Presence of left artificial shoulder joint    THERAPY DIAG:  Acute pain of left shoulder  Stiffness of left shoulder, not elsewhere classified  Rationale for Evaluation and Treatment: Rehabilitation  ONSET DATE:    SUBJECTIVE:  SUBJECTIVE STATEMENT: Patient had a left standard total shoulder replacement last year.  She did well for about 6 months then began having progressive pain in the shoulder.  She had to have a shoulder revision with a reverse total shoulder.  At this time her pain is well-controlled.  She is also had a right shoulder replaced.  When she had that replaced she had significant pain.  She reports this 1 is better.  She is wearing her sling outside the house and when sleeping.  She is otherwise has been wearing her sling.  She has increased pain when she moves her arm.  She has been compliant with her HEP so far. Hand dominance: Right  PERTINENT HISTORY: Multijoint arthritis including knees and hips.  Osteoporosis, right shoulder replacement, mitral valve prolapse  PAIN:  Are you having pain?  Yes: NPRS scale: 4/10 right now can be a 7/10 pain   Pain location: Right anterior shoulder but runs down lateral arm to elbow Pain description: Aching Aggravating factors: Use of the arm Relieving factors: Not using the arm is much  PRECAUTIONS: Shoulder  RED FLAGS: None   WEIGHT BEARING RESTRICTIONS: No  FALLS:  Has patient fallen in last 6 months? No  LIVING ENVIRONMENT:   OCCUPATION: Nurse   PLOF: Independent  PATIENT GOALS:  Patient wants to be functional   NEXT MD VISIT:  2 weeks prior    OBJECTIVE:  Note: Objective measures were completed at Evaluation unless otherwise noted.  DIAGNOSTIC FINDINGS:  Nothing post op   PATIENT SURVEYS :  FOTO    COGNITION: Overall cognitive status: Within functional limits for tasks assessed     SENSATION: Denies parathesias   POSTURE: Good   UPPER EXTREMITY ROM:   Passive ROM Right eval Left eval  Shoulder flexion  85  Shoulder extension    Shoulder abduction    Shoulder adduction    Shoulder internal rotation  Can sit on body comfortably   Shoulder external rotation  20  Elbow flexion    Elbow extension    Wrist flexion    Wrist extension    Wrist ulnar deviation    Wrist radial deviation    Wrist pronation    Wrist supination    (Blank rows = not tested)  UPPER EXTREMITY MMT:  MMT Right eval Left eval  Shoulder flexion    Shoulder extension    Shoulder abduction    Shoulder adduction    Shoulder internal rotation    Shoulder external rotation    Middle trapezius    Lower trapezius    Elbow flexion    Elbow extension    Wrist flexion    Wrist extension    Wrist ulnar deviation    Wrist radial deviation    Wrist pronation    Wrist supination    Grip strength (lbs)    (Blank rows = not tested) Not tested 2nd to recent surgery      PALPATION:  No unexpected TTP  TREATMENT DATE:  Access Code: [email or web address] URL: [email or web address] Date: 03/04/2023 Prepared by: Alm Don  Exercises - Seated Shoulder External Rotation AAROM with Dowel  - 1 x daily - 7 x weekly - 3 sets - 10 reps - Supine Shoulder Flexion AAROM with Hands Clasped  - 1 x daily - 7 x weekly - 3 sets - 10 reps - Seated Scapular Retraction  - 1 x daily - 7 x weekly - 3 sets - 10 reps  Manual: PROM into all planes   PATIENT EDUCATION: Education details: Reviewed HEP symptom mangement  Person educated: Patient Education method: Explanation, Demonstration, Tactile cues, Verbal cues, and Handouts Education comprehension: verbalized understanding, returned demonstration, verbal cues required, tactile cues required, and needs further education  HOME EXERCISE PROGRAM: Access Code: [email or web address] URL: [email or web address] Date: 03/04/2023 Prepared by: Alm Don  Exercises - Seated Shoulder External Rotation AAROM with Dowel  - 1 x daily - 7 x weekly - 3 sets - 10 reps - Supine Shoulder Flexion AAROM with Hands Clasped  - 1 x daily - 7 x weekly - 3 sets - 10 reps - Seated Scapular Retraction  - 1 x daily - 7 x weekly - 3 sets - 10 reps  ASSESSMENT:  CLINICAL IMPRESSION: Patient is a 63 year old female status post right reverse total shoulder revision surgery.  She presents with expected limitations in functional use of left arm, strength, and range of motion.  She would benefit from skilled therapy to improve functional use of left shoulder.  She is active prior to her surgery and would like to return to the gym and lifting weights.   OBJECTIVE IMPAIRMENTS:  decreased ROM, decreased strength, impaired UE functional use, and pain.   ACTIVITY LIMITATIONS: carrying, lifting, sleeping, bathing, toileting, dressing, self feeding, reach over head, and hygiene/grooming  PARTICIPATION LIMITATIONS: meal prep, cleaning, laundry, driving, shopping, community  activity, occupation, and yard work  PERSONAL FACTORS: 1-2 comorbidities: previous replacement on the same shoulder osteoporosis; multi joint OA   are also affecting patient's functional outcome.   REHAB POTENTIAL: Good  CLINICAL DECISION MAKING: Stable/uncomplicated  EVALUATION COMPLEXITY: Low  GOALS: Goals reviewed with patient? Yes  SHORT TERM GOALS: Target date: 04/16/2023    Patient will increase passive flexion to 115 degrees  Baseline: Goal status: INITIAL  2.  Patient will increase left shoulder  ER to 30 degrees  Baseline:  Goal status: INITIAL  3.  Patient will use her left arm for ADL's below 90 degrees with > 2/10 pain  Baseline:  Goal status: INITIAL   LONG TERM GOALS: Target date: 05/28/2023    The patient will reach overhead with left arm without pain in order to perform ADL's  Baseline:  Goal status: INITIAL  2.  Patient will each behind her head without pain in order to per from her ADL's  Baseline:  Goal status: INITIAL  3.  Patient will reach behind her back to L3 without pain in order to perform ADL's  Baseline:  Goal status: INITIAL   PLAN: PT FREQUENCY: 2x/week  PT DURATION: 8 weeks  PLANNED INTERVENTIONS: 97110-Therapeutic exercises, 97530- Therapeutic activity, 97112- Neuromuscular re-education, 97535- Self Care, [id number]- Manual therapy, 7113- Aquatic Therapy, 97014- Electrical stimulation (unattended), 97035- Ultrasound, Patient/Family education, Taping, Dry Needling, DME instructions, Cryotherapy, and Moist heat   PLAN FOR NEXT SESSION:  Begin per Dr supples Reverse total shoulder protocol ( will be on Dave's desk )   Alm JINNY Don, PT 03/05/2023, 4:52 PM

## 2023-03-05 ENCOUNTER — Encounter (HOSPITAL_BASED_OUTPATIENT_CLINIC_OR_DEPARTMENT_OTHER): Payer: Self-pay | Admitting: Physical Therapy

## 2023-03-06 ENCOUNTER — Ambulatory Visit (HOSPITAL_COMMUNITY): Payer: 59

## 2023-03-06 ENCOUNTER — Other Ambulatory Visit: Payer: Self-pay | Admitting: Cardiovascular Disease

## 2023-03-06 ENCOUNTER — Other Ambulatory Visit: Payer: Self-pay

## 2023-03-06 ENCOUNTER — Other Ambulatory Visit (HOSPITAL_COMMUNITY): Payer: Self-pay

## 2023-03-06 NOTE — Telephone Encounter (Signed)
Message routed to PCP [name], [name], NP  

## 2023-03-07 ENCOUNTER — Other Ambulatory Visit: Payer: Self-pay

## 2023-03-07 ENCOUNTER — Encounter (HOSPITAL_BASED_OUTPATIENT_CLINIC_OR_DEPARTMENT_OTHER): Payer: Self-pay | Admitting: Physical Therapy

## 2023-03-07 ENCOUNTER — Ambulatory Visit (HOSPITAL_BASED_OUTPATIENT_CLINIC_OR_DEPARTMENT_OTHER): Payer: 59 | Admitting: Physical Therapy

## 2023-03-07 DIAGNOSIS — M25512 Pain in left shoulder: Secondary | ICD-10-CM | POA: Diagnosis not present

## 2023-03-07 DIAGNOSIS — M25612 Stiffness of left shoulder, not elsewhere classified: Secondary | ICD-10-CM

## 2023-03-07 DIAGNOSIS — Z96612 Presence of left artificial shoulder joint: Secondary | ICD-10-CM | POA: Diagnosis not present

## 2023-03-07 DIAGNOSIS — Z471 Aftercare following joint replacement surgery: Secondary | ICD-10-CM | POA: Diagnosis not present

## 2023-03-07 MED ORDER — ROSUVASTATIN CALCIUM 5 MG PO TABS
5.0000 mg | ORAL_TABLET | Freq: Every day | ORAL | 2 refills | Status: DC
  Filled 2023-03-07: qty 90, 90d supply, fill #0
  Filled 2023-03-23 – 2023-05-31 (×2): qty 90, 90d supply, fill #1

## 2023-03-07 NOTE — Therapy (Signed)
OUTPATIENT PHYSICAL THERAPY UPPER EXTREMITY TREATMENT   Patient Name: Tara [name] MRN: [id number] DOB:[birth year], 63 y.o., female Today's Date: 03/07/2023  END OF SESSION:  PT End of Session - 03/07/23 1617     Visit Number 2    Number of Visits 24    Date for PT Re-Evaluation 05/28/23    PT Start Time 1617    PT Stop Time 1655    PT Time Calculation (min) 38 min             Past Medical History:  Diagnosis Date   Anemia    Arthritis    Hyperlipidemia    Insomnia    Mitral valve prolapse    PONV (postoperative nausea and vomiting)    Past Surgical History:  Procedure Laterality Date   AUGMENTATION MAMMAPLASTY Bilateral    Dec 2019   BREAST BIOPSY Right    axillary node benign   KNEE SURGERY  1988   right   SHOULDER ARTHROSCOPY WITH LABRAL REPAIR Right 08/28/2012   Procedure: RIGHT SHOULDER ARTHROSCOPY WITH LABRAL DEBRIDEMENT, ASPIRATION AND DECOMPRESSION OF PARALABRAL CYST;  Surgeon: Senaida Lange, MD;  Location: [facility];  Service: Orthopedics;  Laterality: Right;   TOTAL SHOULDER ARTHROPLASTY Right 03/24/2020   Procedure: RIGHT REVERSE SHOULDER ARTHROPLASTY;  Surgeon: Francena Hanly, MD;  Location: [facility];  Service: Orthopedics;  Laterality: Right;    TOTAL SHOULDER ARTHROPLASTY Left 02/23/2021   Procedure: TOTAL SHOULDER ARTHROPLASTY;  Surgeon: Francena Hanly, MD;  Location: [facility];  Service: Orthopedics;  Laterality: Left;   TOTAL SHOULDER REVISION Left 02/07/2023   Procedure: Excision Left shoulder nodule; Conversion of left anatomic shoulder arthroplasty to reverse shoulder arthroplasty;  Surgeon: Francena Hanly, MD;  Location: [facility];  Service: Orthopedics;  Laterality: Left;   Patient Active Problem List   Diagnosis Date Noted   Upper respiratory infection 03/01/2023   OA (osteoarthritis) 03/01/2023   Hyponatremia 03/01/2023   Hypercalciuria 02/02/2023   Neurocognitive deficits 07/07/2021   DDD (degenerative disc disease), cervical 04/13/2021    Osteoporosis without current pathological fracture 10/28/2020   Nonallopathic lesion of sacral region 02/26/2020   Hot flashes 02/23/2019   Other insomnia 02/23/2019   Primary osteoarthritis involving multiple joints 02/23/2019   Cystic acne vulgaris 02/23/2019   Bunion of right foot 02/23/2019   Varicose 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 02/23/2019   Multiple atypical nevi 02/23/2019   Polyarthralgia 08/25/2014   Mitral valve prolapse 03/03/2009    PCP: Carilyn Goodpasture [name]   REFERRING PROVIDER: Kaylyn Layer Supple  REFERRING DIAG:  Diagnosis  [phone] (ICD-10-CM) - Presence of left artificial shoulder joint    THERAPY DIAG:  Acute pain of left shoulder  Stiffness of left shoulder, not elsewhere classified  Rationale for Evaluation and Treatment: Rehabilitation  ONSET DATE:  DOS: 02/07/2023  SUBJECTIVE:  SUBJECTIVE STATEMENT: Pt reports her L arm remains sore.  She completed some of HEP exercises while waiting in lobby. She reports she is anxious to return to more active lifestyle. She is no longer wearing sling all of the time.     From Initial Evaluation: Patient had a left standard total shoulder replacement last year.  She did well for about 6 months then began having progressive pain in the shoulder.  She had to have a shoulder revision with a reverse total shoulder.  At this time her pain is well-controlled.  She is also had a right shoulder replaced.  When she had that replaced she had significant pain.  She reports this 1 is better.  She is wearing her sling outside the house and when sleeping.  She is otherwise has been wearing her sling.  She has increased pain when she moves her arm.  She has been compliant with her HEP so far. Hand dominance: Right  PERTINENT HISTORY: Multijoint  arthritis including knees and hips.  Osteoporosis, right shoulder replacement, mitral valve prolapse  PAIN:  Are you having pain? Yes: NPRS scale: 5/10  Pain location: Right anterior shoulder but runs down lateral arm to elbow Pain description: Aching Aggravating factors: Use of the arm Relieving factors: Not using the arm is much  PRECAUTIONS: Shoulder  RED FLAGS: None   WEIGHT BEARING RESTRICTIONS: No  FALLS:  Has patient fallen in last 6 months? No  LIVING ENVIRONMENT:   OCCUPATION: Nurse   PLOF: Independent  PATIENT GOALS:  Patient wants to be functional   NEXT MD VISIT:  2 weeks prior    OBJECTIVE:  Note: Objective measures were completed at Evaluation unless otherwise noted.  DIAGNOSTIC FINDINGS:  Nothing post op   PATIENT SURVEYS :  FOTO    COGNITION: Overall cognitive status: Within functional limits for tasks assessed     SENSATION: Denies parathesias   POSTURE: Good   UPPER EXTREMITY ROM:   Passive ROM Right eval Left eval  Shoulder flexion  85  Shoulder extension    Shoulder abduction    Shoulder adduction    Shoulder internal rotation  Can sit on body comfortably   Shoulder external rotation  20  Elbow flexion    Elbow extension    Wrist flexion    Wrist extension    Wrist ulnar deviation    Wrist radial deviation    Wrist pronation    Wrist supination    (Blank rows = not tested)  UPPER EXTREMITY MMT:  MMT Right eval Left eval  Shoulder flexion    Shoulder extension    Shoulder abduction    Shoulder adduction    Shoulder internal rotation    Shoulder external rotation    Middle trapezius    Lower trapezius    Elbow flexion    Elbow extension    Wrist flexion    Wrist extension    Wrist ulnar deviation    Wrist radial deviation    Wrist pronation    Wrist supination    Grip strength (lbs)    (Blank rows = not tested) Not tested 2nd to recent surgery      PALPATION:  No unexpected TTP  TREATMENT DATE:  Manual: PROM to L shoulder into flex, ER, abdct within protocol guidelines and tissue tolerance with no pain STM to L periscapular musculature Applied reg rock tape to L upper trap/levator with 20% stretch, and I-strip to post deltoid into tricep - to decompress tissue and increase proprioception Therapeutic exercise: Standing isometrics in ER, abdct, flex 5s x 10 each, to tolerance Seated pulleys into L shoulder flexion  Self care: reviewed self massage to periscapular musculature with ball against wall    PATIENT EDUCATION: Education details: Reviewed HEP, Person educated: Patient Education method: Explanation, Demonstration, Tactile cues, Verbal cues, and Handouts Education comprehension: verbalized understanding, returned demonstration, verbal cues required, tactile cues required, and needs further education  HOME EXERCISE PROGRAM: Access Code: [email or web address] URL: [email or web address] Date: 03/07/2023 Prepared by: [facility] - [address]  Exercises - Seated Shoulder External Rotation AAROM with Dowel  - 1-3 x daily - 7 x weekly - 1 sets - 10 reps - Supine Shoulder Flexion AAROM with Hands Clasped  - 1-3 x daily - 7 x weekly - 1 sets - 10 reps - Seated Scapular Retraction  - 3 x daily - 7 x weekly - 5-10 reps - 5 hold - Seated Shoulder Flexion AAROM with Pulley in Front  - 1 x daily - 7 x weekly - 1 sets - 10 reps - Standing Isometric Shoulder Abduction with Doorway - Arm Bent  - 1-2 x daily - 7 x weekly - 1 sets - 10 reps - 5 hold - Supine Shoulder Press with Dowel  - 1-2 x daily - 7 x weekly - 1 sets - 10 reps  ASSESSMENT:  CLINICAL IMPRESSION: Pt is 4 wks status post right reverse total shoulder revision surgery. PROM to shoulder was kept within protocol, along with tissue and pt tolerance. She had some increased discomfort with  ER isometric, despite cue and modifications; held from HEP.   She would benefit from skilled therapy to improve functional use of left shoulder.  Goals are ongoing.    OBJECTIVE IMPAIRMENTS:  decreased ROM, decreased strength, impaired UE functional use, and pain.   ACTIVITY LIMITATIONS: carrying, lifting, sleeping, bathing, toileting, dressing, self feeding, reach over head, and hygiene/grooming  PARTICIPATION LIMITATIONS: meal prep, cleaning, laundry, driving, shopping, community activity, occupation, and yard work  PERSONAL FACTORS: 1-2 comorbidities: previous replacement on the same shoulder osteoporosis; multi joint OA   are also affecting patient's functional outcome.   REHAB POTENTIAL: Good  CLINICAL DECISION MAKING: Stable/uncomplicated  EVALUATION COMPLEXITY: Low  GOALS: Goals reviewed with patient? Yes  SHORT TERM GOALS: Target date: 04/16/2023    Patient will increase passive flexion to 115 degrees  Baseline: Goal status: INITIAL  2.  Patient will increase left shoulder  ER to 30 degrees  Baseline:  Goal status: INITIAL  3.  Patient will use her left arm for ADL's below 90 degrees with > 2/10 pain  Baseline:  Goal status: INITIAL   LONG TERM GOALS: Target date: 05/28/2023    The patient will reach overhead with left arm without pain in order to perform ADL's  Baseline:  Goal status: INITIAL  2.  Patient will each behind her head without pain in order to per from her ADL's  Baseline:  Goal status: INITIAL  3.  Patient will reach behind her back to L3 without pain in order to perform ADL's  Baseline:  Goal status: INITIAL   PLAN: PT FREQUENCY: 2x/week  PT DURATION: 8 weeks  PLANNED INTERVENTIONS: 97110-Therapeutic  exercises, 97530- Therapeutic activity, [id number]- Neuromuscular re-education, [phone]- Self Care, 60454- Manual therapy, 7113- Aquatic Therapy, 97014- Electrical stimulation (unattended), 97035- Ultrasound, Patient/Family education, Taping, Dry  Needling, DME instructions, Cryotherapy, and Moist heat   PLAN FOR NEXT SESSION:  Begin per Dr supples Reverse total shoulder protocol ( will be on Dave's desk )   Mayer Camel, PTA 03/07/23 6:32 PM [facility] [address] [place], [place], [address] Phone: [phone]   Fax:  [phone]

## 2023-03-11 ENCOUNTER — Ambulatory Visit: Payer: 59 | Admitting: Cardiovascular Disease

## 2023-03-11 NOTE — Therapy (Signed)
OUTPATIENT PHYSICAL THERAPY UPPER EXTREMITY TREATMENT   Patient Name: Tara [name] MRN: [id number] DOB:1960-05-24, 63 y.o., female Today's Date: 03/13/2023  END OF SESSION:  PT End of Session - 03/12/23 0855     Visit Number 3    Number of Visits 24    Date for PT Re-Evaluation 05/28/23    PT Start Time 0807    PT Stop Time 0850    PT Time Calculation (min) 43 min    Activity Tolerance Patient tolerated treatment well    Behavior During Therapy [facility] for tasks assessed/performed              Past Medical History:  Diagnosis Date   Anemia    Arthritis    Hyperlipidemia    Insomnia    Mitral valve prolapse    PONV (postoperative nausea and vomiting)    Past Surgical History:  Procedure Laterality Date   AUGMENTATION MAMMAPLASTY Bilateral    Dec 2019   BREAST BIOPSY Right    axillary node benign   KNEE SURGERY  1988   right   SHOULDER ARTHROSCOPY WITH LABRAL REPAIR Right 08/28/2012   Procedure: RIGHT SHOULDER ARTHROSCOPY WITH LABRAL DEBRIDEMENT, ASPIRATION AND DECOMPRESSION OF PARALABRAL CYST;  Surgeon: Senaida Lange, MD;  Location: [facility];  Service: Orthopedics;  Laterality: Right;   TOTAL SHOULDER ARTHROPLASTY Right 03/24/2020   Procedure: RIGHT REVERSE SHOULDER ARTHROPLASTY;  Surgeon: Francena Hanly, MD;  Location: [facility];  Service: Orthopedics;  Laterality: Right;    TOTAL SHOULDER ARTHROPLASTY Left 02/23/2021   Procedure: TOTAL SHOULDER ARTHROPLASTY;  Surgeon: Francena Hanly, MD;  Location: [facility];  Service: Orthopedics;  Laterality: Left;   TOTAL SHOULDER REVISION Left 02/07/2023   Procedure: Excision Left shoulder nodule; Conversion of left anatomic shoulder arthroplasty to reverse shoulder arthroplasty;  Surgeon: Francena Hanly, MD;  Location: [facility];  Service: Orthopedics;  Laterality: Left;   Patient Active Problem List   Diagnosis Date Noted   Upper respiratory infection 03/01/2023   OA (osteoarthritis) 03/01/2023   Hyponatremia 03/01/2023    Hypercalciuria 02/02/2023   Neurocognitive deficits 07/07/2021   DDD (degenerative disc disease), cervical 04/13/2021   Osteoporosis without current pathological fracture 10/28/2020   Nonallopathic lesion of sacral region 02/26/2020   Hot flashes 02/23/2019   Other insomnia 02/23/2019   Primary osteoarthritis involving multiple joints 02/23/2019   Cystic acne vulgaris 02/23/2019   Bunion of right foot 02/23/2019   Varicose 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 02/23/2019   Multiple atypical nevi 02/23/2019   Polyarthralgia 08/25/2014   Mitral valve prolapse 03/03/2009    PCP: Carilyn Goodpasture [name]   REFERRING PROVIDER: Kaylyn Layer Supple  REFERRING DIAG:  Diagnosis  [phone] (ICD-10-CM) - Presence of left artificial shoulder joint    THERAPY DIAG:  Acute pain of left shoulder  Stiffness of left shoulder, not elsewhere classified  Rationale for Evaluation and Treatment: Rehabilitation  ONSET DATE:  DOS: 02/07/2023  SUBJECTIVE:  SUBJECTIVE STATEMENT: Pt is 4 weeks and 5 days post op.  Pt denies any adverse effects after prior Rx.  Pt states her shoulder and arm have been very angry since she has been using it more.  She has been having pain up her neck, in scapula and shoulder, and in L UE.  Her pain worsens during the day and is an 8/10 by the afternoon.  Pt reports compliance with HEP.  She has been using ice at home.  Pt states her K tape is unraveling.      From Initial Evaluation: Patient had a left standard total shoulder replacement last year.  She did well for about 6 months then began having progressive pain in the shoulder.  She had to have a shoulder revision with a reverse total shoulder.  At this time her pain is well-controlled.  She is also had a right shoulder replaced.  When she had  that replaced she had significant pain.  She reports this 1 is better.  She is wearing her sling outside the house and when sleeping.  She is otherwise has been wearing her sling.  She has increased pain when she moves her arm.  She has been compliant with her HEP so far. Hand dominance: Right  PERTINENT HISTORY: Multijoint arthritis including knees and hips.  Osteoporosis, right shoulder replacement, mitral valve prolapse  PAIN:  Are you having pain? Yes: NPRS scale: 4/10  Pain location: Right anterior shoulder but runs down lateral arm to elbow Pain description: Aching Aggravating factors: Use of the arm Relieving factors: Not using the arm is much  PRECAUTIONS: Shoulder  RED FLAGS: None   WEIGHT BEARING RESTRICTIONS: No  FALLS:  Has patient fallen in last 6 months? No  LIVING ENVIRONMENT:   OCCUPATION: Nurse   PLOF: Independent  PATIENT GOALS:  Patient wants to be functional   NEXT MD VISIT:  2 weeks prior    OBJECTIVE:  Note: Objective measures were completed at Evaluation unless otherwise noted.  DIAGNOSTIC FINDINGS:  Nothing post op   PATIENT SURVEYS :  FOTO    COGNITION: Overall cognitive status: Within functional limits for tasks assessed     SENSATION: Denies parathesias   POSTURE: Good   UPPER EXTREMITY ROM:   Passive ROM Right eval Left eval Left 1/21  Shoulder flexion  85 106  Shoulder extension     Shoulder abduction     Shoulder adduction     Shoulder internal rotation  Can sit on body comfortably    Shoulder external rotation  20 26  Elbow flexion     Elbow extension     Wrist flexion     Wrist extension     Wrist ulnar deviation     Wrist radial deviation     Wrist pronation     Wrist supination     (Blank rows = not tested)  UPPER EXTREMITY MMT:  MMT Right eval Left eval  Shoulder flexion    Shoulder extension    Shoulder abduction    Shoulder adduction    Shoulder internal rotation    Shoulder external rotation     Middle trapezius    Lower trapezius    Elbow flexion    Elbow extension    Wrist flexion    Wrist extension    Wrist ulnar deviation    Wrist radial deviation    Wrist pronation    Wrist supination    Grip strength (lbs)    (Blank rows =  not tested) Not tested 2nd to recent surgery      PALPATION:  No unexpected TTP                                                                                                                              TREATMENT DATE:  Reviewed response to prior Rx, HEP compliance, and pain levels  Pt received L shoulder PROM in flexion, abd, and ER per pt and tissue tolerance w/n protocol ranges  Supine flexion AAROM with hands clasped w/n protocol range x10 reps Supine wand ER 2x10 w/n protocol range Seated pulleys in flexion 2x10 w/n protocol range Standing abd isometrics with 5 sec hold x 10 reps Pendulums cw and ccw  See below for pt education   PATIENT EDUCATION: Education details:  HEP, exercise form, rationale of interventions, protocol restrictions and limitations, protocol ranges, and ROM findings Person educated: Patient Education method: Explanation, Demonstration, Tactile cues, Verbal cues Education comprehension: verbalized understanding, returned demonstration, verbal cues required, tactile cues required, and needs further education  HOME EXERCISE PROGRAM: Access Code: [email or web address] URL: [email or web address] Date: 03/07/2023 Prepared by: [facility] - [facility]patient Re[facility] - [address]  Exercises - Seated Shoulder External Rotation AAROM with Dowel  - 1-3 x daily - 7 x weekly - 1 sets - 10 reps - Supine Shoulder Flexion AAROM with Hands Clasped  - 1-3 x daily - 7 x weekly - 1 sets - 10 reps - Seated Scapular Retraction  - 3 x daily - 7 x weekly - 5-10 reps - 5 hold - Seated Shoulder Flexion AAROM with Pulley in Front  - 1 x daily - 7 x weekly - 1 sets - 10 reps - Standing Isometric Shoulder Abduction with Doorway - Arm Bent   - 1-2 x daily - 7 x weekly - 1 sets - 10 reps - 5 hold - Supine Shoulder Press with Dowel  - 1-2 x daily - 7 x weekly - 1 sets - 10 reps  ASSESSMENT:  CLINICAL IMPRESSION: Pt still had K-tape on shoulder from prior Rx and the tape was unraveling.  PT removed K-tape.  PT performed PROM per pt and tissue tolerance w/n protocol ranges and she tolerated PROM well.  Pt does have tightness in ER ROM.  Pt is improving with L shoulder PROM as evidenced by goniometric measurements.  PT had pt perform pendulums though pt had much dificulty with correct form and was unable to demo correct form.  PT had pt stop performing pendulums.  Pt reports increased pain to 6/10 after Rx.  PT  instructed pt to use ice when returning home and pt agreed.  She should benefit from cont skilled PT per protocol to improve ROM, address goals, and improve function.   OBJECTIVE IMPAIRMENTS:  decreased ROM, decreased strength, impaired UE functional use, and pain.   ACTIVITY LIMITATIONS: carrying, lifting, sleeping, bathing, toileting, dressing, self feeding, reach over head, and hygiene/grooming  PARTICIPATION LIMITATIONS: meal prep, cleaning,  laundry, driving, shopping, community activity, occupation, and yard work  PERSONAL FACTORS: 1-2 comorbidities: previous replacement on the same shoulder osteoporosis; multi joint OA   are also affecting patient's functional outcome.   REHAB POTENTIAL: Good  CLINICAL DECISION MAKING: Stable/uncomplicated  EVALUATION COMPLEXITY: Low  GOALS: Goals reviewed with patient? Yes  SHORT TERM GOALS: Target date: 04/16/2023    Patient will increase passive flexion to 115 degrees  Baseline: Goal status: INITIAL  2.  Patient will increase left shoulder  ER to 30 degrees  Baseline:  Goal status: INITIAL  3.  Patient will use her left arm for ADL's below 90 degrees with > 2/10 pain  Baseline:  Goal status: INITIAL   LONG TERM GOALS: Target date: 05/28/2023    The patient will  reach overhead with left arm without pain in order to perform ADL's  Baseline:  Goal status: INITIAL  2.  Patient will each behind her head without pain in order to per from her ADL's  Baseline:  Goal status: INITIAL  3.  Patient will reach behind her back to L3 without pain in order to perform ADL's  Baseline:  Goal status: INITIAL   PLAN: PT FREQUENCY: 2x/week  PT DURATION: 8 weeks  PLANNED INTERVENTIONS: 97110-Therapeutic exercises, 97530- Therapeutic activity, 97112- Neuromuscular re-education, 97535- Self Care, 13086- Manual therapy, 7113- Aquatic Therapy, 97014- Electrical stimulation (unattended), 97035- Ultrasound, Patient/Family education, Taping, Dry Needling, DME instructions, Cryotherapy, and Moist heat   PLAN FOR NEXT SESSION:  Cont per Dr. Dub Mikes Reverse total shoulder protocol ( will be on Dave's desk )  Audie Clear III PT, DPT 03/13/23 1:09 PM

## 2023-03-12 ENCOUNTER — Ambulatory Visit (HOSPITAL_BASED_OUTPATIENT_CLINIC_OR_DEPARTMENT_OTHER): Payer: 59 | Admitting: Physical Therapy

## 2023-03-12 DIAGNOSIS — Z96612 Presence of left artificial shoulder joint: Secondary | ICD-10-CM | POA: Diagnosis not present

## 2023-03-12 DIAGNOSIS — M25612 Stiffness of left shoulder, not elsewhere classified: Secondary | ICD-10-CM | POA: Diagnosis not present

## 2023-03-12 DIAGNOSIS — M25512 Pain in left shoulder: Secondary | ICD-10-CM

## 2023-03-12 DIAGNOSIS — Z471 Aftercare following joint replacement surgery: Secondary | ICD-10-CM | POA: Diagnosis not present

## 2023-03-13 ENCOUNTER — Encounter (HOSPITAL_BASED_OUTPATIENT_CLINIC_OR_DEPARTMENT_OTHER): Payer: Self-pay | Admitting: Physical Therapy

## 2023-03-16 ENCOUNTER — Ambulatory Visit (HOSPITAL_BASED_OUTPATIENT_CLINIC_OR_DEPARTMENT_OTHER): Payer: 59 | Admitting: Physical Therapy

## 2023-03-16 ENCOUNTER — Encounter (HOSPITAL_BASED_OUTPATIENT_CLINIC_OR_DEPARTMENT_OTHER): Payer: Self-pay | Admitting: Physical Therapy

## 2023-03-16 DIAGNOSIS — M25612 Stiffness of left shoulder, not elsewhere classified: Secondary | ICD-10-CM | POA: Diagnosis not present

## 2023-03-16 DIAGNOSIS — Z96612 Presence of left artificial shoulder joint: Secondary | ICD-10-CM | POA: Diagnosis not present

## 2023-03-16 DIAGNOSIS — M25512 Pain in left shoulder: Secondary | ICD-10-CM

## 2023-03-16 DIAGNOSIS — Z471 Aftercare following joint replacement surgery: Secondary | ICD-10-CM | POA: Diagnosis not present

## 2023-03-16 NOTE — Therapy (Signed)
OUTPATIENT PHYSICAL THERAPY UPPER EXTREMITY TREATMENT   Patient Name: Tara [name] MRN: [id number] DOB:01-12-1961, 63 y.o., female Today's Date: 03/16/2023  END OF SESSION:  PT End of Session - 03/16/23 1114     Visit Number 5    Number of Visits 24    Date for PT Re-Evaluation 05/28/23    PT Start Time 1000    PT Stop Time 1045    PT Time Calculation (min) 45 min    Activity Tolerance Patient tolerated treatment well    Behavior During Therapy [facility] for tasks assessed/performed              Past Medical History:  Diagnosis Date   Anemia    Arthritis    Hyperlipidemia    Insomnia    Mitral valve prolapse    PONV (postoperative nausea and vomiting)    Past Surgical History:  Procedure Laterality Date   AUGMENTATION MAMMAPLASTY Bilateral    Dec 2019   BREAST BIOPSY Right    axillary node benign   KNEE SURGERY  1988   right   SHOULDER ARTHROSCOPY WITH LABRAL REPAIR Right 08/28/2012   Procedure: RIGHT SHOULDER ARTHROSCOPY WITH LABRAL DEBRIDEMENT, ASPIRATION AND DECOMPRESSION OF PARALABRAL CYST;  Surgeon: Senaida Lange, MD;  Location: [facility];  Service: Orthopedics;  Laterality: Right;   TOTAL SHOULDER ARTHROPLASTY Right 03/24/2020   Procedure: RIGHT REVERSE SHOULDER ARTHROPLASTY;  Surgeon: Francena Hanly, MD;  Location: [facility];  Service: Orthopedics;  Laterality: Right;    TOTAL SHOULDER ARTHROPLASTY Left 02/23/2021   Procedure: TOTAL SHOULDER ARTHROPLASTY;  Surgeon: Francena Hanly, MD;  Location: [facility];  Service: Orthopedics;  Laterality: Left;   TOTAL SHOULDER REVISION Left 02/07/2023   Procedure: Excision Left shoulder nodule; Conversion of left anatomic shoulder arthroplasty to reverse shoulder arthroplasty;  Surgeon: Francena Hanly, MD;  Location: [facility];  Service: Orthopedics;  Laterality: Left;   Patient Active Problem List   Diagnosis Date Noted   Upper respiratory infection 03/01/2023   OA (osteoarthritis) 03/01/2023   Hyponatremia 03/01/2023    Hypercalciuria 02/02/2023   Neurocognitive deficits 07/07/2021   DDD (degenerative disc disease), cervical 04/13/2021   Osteoporosis without current pathological fracture 10/28/2020   Nonallopathic lesion of sacral region 02/26/2020   Hot flashes 02/23/2019   Other insomnia 02/23/2019   Primary osteoarthritis involving multiple joints 02/23/2019   Cystic acne vulgaris 02/23/2019   Bunion of right foot 02/23/2019   Varicose 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 02/23/2019   Multiple atypical nevi 02/23/2019   Polyarthralgia 08/25/2014   Mitral valve prolapse 03/03/2009    PCP: Carilyn Goodpasture [name]   REFERRING PROVIDER: Kaylyn Layer Supple  REFERRING DIAG:  Diagnosis  [phone] (ICD-10-CM) - Presence of left artificial shoulder joint    THERAPY DIAG:  Acute pain of left shoulder  Stiffness of left shoulder, not elsewhere classified  Rationale for Evaluation and Treatment: Rehabilitation  ONSET DATE:  DOS: 02/07/2023  SUBJECTIVE:  SUBJECTIVE STATEMENT: Pt is 5 weeks and 2 days post op.  Pt states that she has been active with her HEP. She feels as though she is struggling to progress due to tension she feels throughout her L UE.    From Initial Evaluation: Patient had a left standard total shoulder replacement last year.  She did well for about 6 months then began having progressive pain in the shoulder.  She had to have a shoulder revision with a reverse total shoulder.  At this time her pain is well-controlled.  She is also had a right shoulder replaced.  When she had that replaced she had significant pain.  She reports this 1 is better.  She is wearing her sling outside the house and when sleeping.  She is otherwise has been wearing her sling.  She has increased pain when she moves her arm.  She has  been compliant with her HEP so far. Hand dominance: Right  PERTINENT HISTORY: Multijoint arthritis including knees and hips.  Osteoporosis, right shoulder replacement, mitral valve prolapse  PAIN:  Are you having pain? Yes: NPRS scale: 4/10  Pain location: Right anterior shoulder but runs down lateral arm to elbow Pain description: Aching Aggravating factors: Use of the arm Relieving factors: Not using the arm is much  PRECAUTIONS: Shoulder  RED FLAGS: None   WEIGHT BEARING RESTRICTIONS: No  FALLS:  Has patient fallen in last 6 months? No  LIVING ENVIRONMENT:   OCCUPATION: Nurse   PLOF: Independent  PATIENT GOALS:  Patient wants to be functional   NEXT MD VISIT:  2 weeks prior    OBJECTIVE:  Note: Objective measures were completed at Evaluation unless otherwise noted.  DIAGNOSTIC FINDINGS:  Nothing post op   PATIENT SURVEYS :  FOTO    COGNITION: Overall cognitive status: Within functional limits for tasks assessed     SENSATION: Denies parathesias   POSTURE: Good   UPPER EXTREMITY ROM:   Passive ROM Right eval Left eval Left 1/21  Shoulder flexion  85 106  Shoulder extension     Shoulder abduction     Shoulder adduction     Shoulder internal rotation  Can sit on body comfortably    Shoulder external rotation  20 26  Elbow flexion     Elbow extension     Wrist flexion     Wrist extension     Wrist ulnar deviation     Wrist radial deviation     Wrist pronation     Wrist supination     (Blank rows = not tested)  UPPER EXTREMITY MMT:  MMT Right eval Left eval  Shoulder flexion    Shoulder extension    Shoulder abduction    Shoulder adduction    Shoulder internal rotation    Shoulder external rotation    Middle trapezius    Lower trapezius    Elbow flexion    Elbow extension    Wrist flexion    Wrist extension    Wrist ulnar deviation    Wrist radial deviation    Wrist pronation    Wrist supination    Grip strength (lbs)     (Blank rows = not tested) Not tested 2nd to recent surgery      PALPATION:  No unexpected TTP  TREATMENT DATE: 01/25: Reviewed response to prior Rx, HEP compliance, and pain levels  STM to L UT, sub scap, lats, pec and biceps.   Cervical stretches  Pt received L shoulder PROM in flexion, abd, and ER per pt and tissue tolerance w/n protocol ranges.   01/21: Reviewed response to prior Rx, HEP compliance, and pain levels  Pt received L shoulder PROM in flexion, abd, and ER per pt and tissue tolerance w/n protocol ranges  Supine flexion AAROM with hands clasped w/n protocol range x10 reps Supine wand ER 2x10 w/n protocol range Seated pulleys in flexion 2x10 w/n protocol range Standing abd isometrics with 5 sec hold x 10 reps Pendulums cw and ccw  See below for pt education   PATIENT EDUCATION: Education details:  HEP, exercise form, rationale of interventions, protocol restrictions and limitations, protocol ranges, and ROM findings Person educated: Patient Education method: Explanation, Demonstration, Tactile cues, Verbal cues Education comprehension: verbalized understanding, returned demonstration, verbal cues required, tactile cues required, and needs further education  HOME EXERCISE PROGRAM: Access Code: [email or web address] URL: [email or web address] Date: 03/07/2023 Prepared by: [facility] - [facility] Re[facility] - [address]  Exercises - Seated Shoulder External Rotation AAROM with Dowel  - 1-3 x daily - 7 x weekly - 1 sets - 10 reps - Supine Shoulder Flexion AAROM with Hands Clasped  - 1-3 x daily - 7 x weekly - 1 sets - 10 reps - Seated Scapular Retraction  - 3 x daily - 7 x weekly - 5-10 reps - 5 hold - Seated Shoulder Flexion AAROM with Pulley in Front  - 1 x daily - 7 x weekly - 1 sets - 10 reps - Standing Isometric Shoulder Abduction  with Doorway - Arm Bent  - 1-2 x daily - 7 x weekly - 1 sets - 10 reps - 5 hold - Supine Shoulder Press with Dowel  - 1-2 x daily - 7 x weekly - 1 sets - 10 reps  ASSESSMENT:  CLINICAL IMPRESSION: Due to pt's reports and request, STM was performed to L UE. Pt has a lot of tension present throughout with most residing in subscap, bicep and UT. She has a nodule at the end of her scar that she would like to be removed next session. (PT couldn't not find tweezers). It does not bother her pain wise. PT performed PROM per pt and tissue tolerance w/n protocol ranges and she tolerated PROM well. She reports and demonstrates improvements with range without pain or apprehension. Pt does have tightness in ER ROM. Pt encouraged to continue with HEP at home as tolerated. She should benefit from cont skilled PT per protocol to improve ROM, address goals, and improve function.   OBJECTIVE IMPAIRMENTS:  decreased ROM, decreased strength, impaired UE functional use, and pain.   ACTIVITY LIMITATIONS: carrying, lifting, sleeping, bathing, toileting, dressing, self feeding, reach over head, and hygiene/grooming  PARTICIPATION LIMITATIONS: meal prep, cleaning, laundry, driving, shopping, community activity, occupation, and yard work  PERSONAL FACTORS: 1-2 comorbidities: previous replacement on the same shoulder osteoporosis; multi joint OA   are also affecting patient's functional outcome.   REHAB POTENTIAL: Good  CLINICAL DECISION MAKING: Stable/uncomplicated  EVALUATION COMPLEXITY: Low  GOALS: Goals reviewed with patient? Yes  SHORT TERM GOALS: Target date: 04/16/2023    Patient will increase passive flexion to 115 degrees  Baseline: Goal status: INITIAL  2.  Patient will increase left shoulder  ER to 30 degrees  Baseline:  Goal status: INITIAL  3.  Patient will use  her left arm for ADL's below 90 degrees with > 2/10 pain  Baseline:  Goal status: INITIAL   LONG TERM GOALS: Target date:  05/28/2023    The patient will reach overhead with left arm without pain in order to perform ADL's  Baseline:  Goal status: INITIAL  2.  Patient will each behind her head without pain in order to per from her ADL's  Baseline:  Goal status: INITIAL  3.  Patient will reach behind her back to L3 without pain in order to perform ADL's  Baseline:  Goal status: INITIAL   PLAN: PT FREQUENCY: 2x/week  PT DURATION: 8 weeks  PLANNED INTERVENTIONS: 97110-Therapeutic exercises, 97530- Therapeutic activity, 97112- Neuromuscular re-education, 97535- Self Care, 09811- Manual therapy, 7113- Aquatic Therapy, 97014- Electrical stimulation (unattended), 97035- Ultrasound, Patient/Family education, Taping, Dry Needling, DME instructions, Cryotherapy, and Moist heat   PLAN FOR NEXT SESSION:  Cont per Dr. Dub Mikes Reverse total shoulder protocol ( will be on Dave's desk )  Royal Hawthorn PT, DPT 03/16/23  11:15 AM

## 2023-03-19 ENCOUNTER — Encounter (HOSPITAL_BASED_OUTPATIENT_CLINIC_OR_DEPARTMENT_OTHER): Payer: Self-pay | Admitting: Physical Therapy

## 2023-03-19 ENCOUNTER — Ambulatory Visit (HOSPITAL_BASED_OUTPATIENT_CLINIC_OR_DEPARTMENT_OTHER): Payer: 59 | Admitting: Physical Therapy

## 2023-03-19 DIAGNOSIS — Z471 Aftercare following joint replacement surgery: Secondary | ICD-10-CM | POA: Diagnosis not present

## 2023-03-19 DIAGNOSIS — M25612 Stiffness of left shoulder, not elsewhere classified: Secondary | ICD-10-CM

## 2023-03-19 DIAGNOSIS — M25512 Pain in left shoulder: Secondary | ICD-10-CM

## 2023-03-19 DIAGNOSIS — Z96612 Presence of left artificial shoulder joint: Secondary | ICD-10-CM | POA: Diagnosis not present

## 2023-03-19 NOTE — Therapy (Signed)
OUTPATIENT PHYSICAL THERAPY UPPER EXTREMITY TREATMENT   Patient Name: Tara [name] MRN: [id number] DOB:Oct 19, 1960, 63 y.o., female Today's Date: 03/19/2023  END OF SESSION:  PT End of Session - 03/19/23 0850     Visit Number 6    Number of Visits 24    Date for PT Re-Evaluation 05/28/23    PT Start Time 0847    PT Stop Time 0928    PT Time Calculation (min) 41 min    Activity Tolerance Patient tolerated treatment well    Behavior During Therapy [facility] for tasks assessed/performed              Past Medical History:  Diagnosis Date   Anemia    Arthritis    Hyperlipidemia    Insomnia    Mitral valve prolapse    PONV (postoperative nausea and vomiting)    Past Surgical History:  Procedure Laterality Date   AUGMENTATION MAMMAPLASTY Bilateral    Dec 2019   BREAST BIOPSY Right    axillary node benign   KNEE SURGERY  1988   right   SHOULDER ARTHROSCOPY WITH LABRAL REPAIR Right 08/28/2012   Procedure: RIGHT SHOULDER ARTHROSCOPY WITH LABRAL DEBRIDEMENT, ASPIRATION AND DECOMPRESSION OF PARALABRAL CYST;  Surgeon: Senaida Lange, MD;  Location: [facility];  Service: Orthopedics;  Laterality: Right;   TOTAL SHOULDER ARTHROPLASTY Right 03/24/2020   Procedure: RIGHT REVERSE SHOULDER ARTHROPLASTY;  Surgeon: Francena Hanly, MD;  Location: [facility];  Service: Orthopedics;  Laterality: Right;    TOTAL SHOULDER ARTHROPLASTY Left 02/23/2021   Procedure: TOTAL SHOULDER ARTHROPLASTY;  Surgeon: Francena Hanly, MD;  Location: [facility];  Service: Orthopedics;  Laterality: Left;   TOTAL SHOULDER REVISION Left 02/07/2023   Procedure: Excision Left shoulder nodule; Conversion of left anatomic shoulder arthroplasty to reverse shoulder arthroplasty;  Surgeon: Francena Hanly, MD;  Location: [facility];  Service: Orthopedics;  Laterality: Left;   Patient Active Problem List   Diagnosis Date Noted   Upper respiratory infection 03/01/2023   OA (osteoarthritis) 03/01/2023   Hyponatremia 03/01/2023    Hypercalciuria 02/02/2023   Neurocognitive deficits 07/07/2021   DDD (degenerative disc disease), cervical 04/13/2021   Osteoporosis without current pathological fracture 10/28/2020   Nonallopathic lesion of sacral region 02/26/2020   Hot flashes 02/23/2019   Other insomnia 02/23/2019   Primary osteoarthritis involving multiple joints 02/23/2019   Cystic acne vulgaris 02/23/2019   Bunion of right foot 02/23/2019   Varicose 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 02/23/2019   Multiple atypical nevi 02/23/2019   Polyarthralgia 08/25/2014   Mitral valve prolapse 03/03/2009    PCP: Carilyn Goodpasture [name]   REFERRING PROVIDER: Kaylyn Layer Supple  REFERRING DIAG:  Diagnosis  [phone] (ICD-10-CM) - Presence of left artificial shoulder joint    THERAPY DIAG:  Acute pain of left shoulder  Stiffness of left shoulder, not elsewhere classified  Rationale for Evaluation and Treatment: Rehabilitation  ONSET DATE:  DOS: 02/07/2023  SUBJECTIVE:  SUBJECTIVE STATEMENT: Pt is 5 weeks and 2 days post op.  Pt states that she has been active with her HEP. She feels as though she is struggling to progress due to tension she feels throughout her L UE.    From Initial Evaluation: Patient had a left standard total shoulder replacement last year.  She did well for about 6 months then began having progressive pain in the shoulder.  She had to have a shoulder revision with a reverse total shoulder.  At this time her pain is well-controlled.  She is also had a right shoulder replaced.  When she had that replaced she had significant pain.  She reports this 1 is better.  She is wearing her sling outside the house and when sleeping.  She is otherwise has been wearing her sling.  She has increased pain when she moves her arm.  She has  been compliant with her HEP so far. Hand dominance: Right  PERTINENT HISTORY: Multijoint arthritis including knees and hips.  Osteoporosis, right shoulder replacement, mitral valve prolapse  PAIN:  Are you having pain? Yes: NPRS scale: 4/10  Pain location: Right anterior shoulder but runs down lateral arm to elbow Pain description: Aching Aggravating factors: Use of the arm Relieving factors: Not using the arm is much  PRECAUTIONS: Shoulder  RED FLAGS: None   WEIGHT BEARING RESTRICTIONS: No  FALLS:  Has patient fallen in last 6 months? No  LIVING ENVIRONMENT:   OCCUPATION: Nurse   PLOF: Independent  PATIENT GOALS:  Patient wants to be functional   NEXT MD VISIT:  2 weeks prior    OBJECTIVE:  Note: Objective measures were completed at Evaluation unless otherwise noted.  DIAGNOSTIC FINDINGS:  Nothing post op   PATIENT SURVEYS :  FOTO    COGNITION: Overall cognitive status: Within functional limits for tasks assessed     SENSATION: Denies parathesias   POSTURE: Good   UPPER EXTREMITY ROM:   Passive ROM Right eval Left eval Left 1/21  Shoulder flexion  85 106  Shoulder extension     Shoulder abduction     Shoulder adduction     Shoulder internal rotation  Can sit on body comfortably    Shoulder external rotation  20 26  Elbow flexion     Elbow extension     Wrist flexion     Wrist extension     Wrist ulnar deviation     Wrist radial deviation     Wrist pronation     Wrist supination     (Blank rows = not tested)  UPPER EXTREMITY MMT:  MMT Right eval Left eval  Shoulder flexion    Shoulder extension    Shoulder abduction    Shoulder adduction    Shoulder internal rotation    Shoulder external rotation    Middle trapezius    Lower trapezius    Elbow flexion    Elbow extension    Wrist flexion    Wrist extension    Wrist ulnar deviation    Wrist radial deviation    Wrist pronation    Wrist supination    Grip strength (lbs)     (Blank rows = not tested) Not tested 2nd to recent surgery      PALPATION:  No unexpected TTP  TREATMENT DATE: 1/28 Manual: PROM into all planes per protocl; trigger point release to pec and anterior shoulder.   SHoulder press x15 1 lb 2x15 2lbs  Shoulder flexion 2x10 1 lb   Supine ER 2x10   Fingler ladder 2x10      01/25: Reviewed response to prior Rx, HEP compliance, and pain levels  STM to L UT, sub scap, lats, pec and biceps.   Cervical stretches  Pt received L shoulder PROM in flexion, abd, and ER per pt and tissue tolerance w/n protocol ranges.   01/21: Reviewed response to prior Rx, HEP compliance, and pain levels  Pt received L shoulder PROM in flexion, abd, and ER per pt and tissue tolerance w/n protocol ranges  Supine flexion AAROM with hands clasped w/n protocol range x10 reps Supine wand ER 2x10 w/n protocol range Seated pulleys in flexion 2x10 w/n protocol range Standing abd isometrics with 5 sec hold x 10 reps Pendulums cw and ccw  See below for pt education   PATIENT EDUCATION: Education details:  HEP, exercise form, rationale of interventions, protocol restrictions and limitations, protocol ranges, and ROM findings Person educated: Patient Education method: Explanation, Demonstration, Tactile cues, Verbal cues Education comprehension: verbalized understanding, returned demonstration, verbal cues required, tactile cues required, and needs further education  HOME EXERCISE PROGRAM: Access Code: [email or web address] URL: [email or web address] Date: 03/07/2023 Prepared by: [facility] - Outpatient Rehab - [address]  Exercises - Seated Shoulder External Rotation AAROM with Dowel  - 1-3 x daily - 7 x weekly - 1 sets - 10 reps - Supine Shoulder Flexion AAROM with Hands Clasped  - 1-3 x daily - 7 x weekly - 1 sets - 10  reps - Seated Scapular Retraction  - 3 x daily - 7 x weekly - 5-10 reps - 5 hold - Seated Shoulder Flexion AAROM with Pulley in Front  - 1 x daily - 7 x weekly - 1 sets - 10 reps - Standing Isometric Shoulder Abduction with Doorway - Arm Bent  - 1-2 x daily - 7 x weekly - 1 sets - 10 reps - 5 hold - Supine Shoulder Press with Dowel  - 1-2 x daily - 7 x weekly - 1 sets - 10 reps  ASSESSMENT:  CLINICAL IMPRESSION: The patient is currently at 6 weeks post op. We advanced some of her AAROM exercises. We added a little weight to her shoulder press. We also added in the finger ladder. We will consider progressing to prone scapular strengthening next visit. Her PROM is progressing to protocol ranges.    OBJECTIVE IMPAIRMENTS:  decreased ROM, decreased strength, impaired UE functional use, and pain.   ACTIVITY LIMITATIONS: carrying, lifting, sleeping, bathing, toileting, dressing, self feeding, reach over head, and hygiene/grooming  PARTICIPATION LIMITATIONS: meal prep, cleaning, laundry, driving, shopping, community activity, occupation, and yard work  PERSONAL FACTORS: 1-2 comorbidities: previous replacement on the same shoulder osteoporosis; multi joint OA   are also affecting patient's functional outcome.   REHAB POTENTIAL: Good  CLINICAL DECISION MAKING: Stable/uncomplicated  EVALUATION COMPLEXITY: Low  GOALS: Goals reviewed with patient? Yes  SHORT TERM GOALS: Target date: 04/16/2023    Patient will increase passive flexion to 115 degrees  Baseline: Goal status: INITIAL  2.  Patient will increase left shoulder  ER to 30 degrees  Baseline:  Goal status: INITIAL  3.  Patient will use her left arm for ADL's below 90 degrees with > 2/10 pain  Baseline:  Goal status: INITIAL   LONG TERM GOALS: Target  date: 05/28/2023    The patient will reach overhead with left arm without pain in order to perform ADL's  Baseline:  Goal status: INITIAL  2.  Patient will each behind her  head without pain in order to per from her ADL's  Baseline:  Goal status: INITIAL  3.  Patient will reach behind her back to L3 without pain in order to perform ADL's  Baseline:  Goal status: INITIAL   PLAN: PT FREQUENCY: 2x/week  PT DURATION: 8 weeks  PLANNED INTERVENTIONS: 97110-Therapeutic exercises, 97530- Therapeutic activity, 97112- Neuromuscular re-education, 97535- Self Care, 16109- Manual therapy, 7113- Aquatic Therapy, 97014- Electrical stimulation (unattended), 97035- Ultrasound, Patient/Family education, Taping, Dry Needling, DME instructions, Cryotherapy, and Moist heat   PLAN FOR NEXT SESSION:  Cont per Dr. Dub Mikes Reverse total shoulder protocol ( will be on Dave's desk )  Royal Hawthorn PT, DPT 03/19/23  8:51 AM

## 2023-03-22 ENCOUNTER — Encounter (HOSPITAL_BASED_OUTPATIENT_CLINIC_OR_DEPARTMENT_OTHER): Payer: Self-pay | Admitting: Physical Therapy

## 2023-03-22 ENCOUNTER — Ambulatory Visit (HOSPITAL_BASED_OUTPATIENT_CLINIC_OR_DEPARTMENT_OTHER): Payer: 59 | Admitting: Physical Therapy

## 2023-03-22 DIAGNOSIS — M25512 Pain in left shoulder: Secondary | ICD-10-CM | POA: Diagnosis not present

## 2023-03-22 DIAGNOSIS — Z471 Aftercare following joint replacement surgery: Secondary | ICD-10-CM | POA: Diagnosis not present

## 2023-03-22 DIAGNOSIS — M25612 Stiffness of left shoulder, not elsewhere classified: Secondary | ICD-10-CM

## 2023-03-22 DIAGNOSIS — Z96612 Presence of left artificial shoulder joint: Secondary | ICD-10-CM | POA: Diagnosis not present

## 2023-03-22 NOTE — Therapy (Signed)
OUTPATIENT PHYSICAL THERAPY UPPER EXTREMITY TREATMENT   Patient Name: Tara [name] MRN: [id number] DOB:12-25-1960, 63 y.o., female Today's Date: 03/22/2023  END OF SESSION:  PT End of Session - 03/22/23 1434     Visit Number 7    Number of Visits 24    Date for PT Re-Evaluation 05/28/23    PT Start Time 1433    PT Stop Time 1514    PT Time Calculation (min) 41 min    Activity Tolerance Patient tolerated treatment well    Behavior During Therapy [facility] for tasks assessed/performed               Past Medical History:  Diagnosis Date   Anemia    Arthritis    Hyperlipidemia    Insomnia    Mitral valve prolapse    PONV (postoperative nausea and vomiting)    Past Surgical History:  Procedure Laterality Date   AUGMENTATION MAMMAPLASTY Bilateral    Dec 2019   BREAST BIOPSY Right    axillary node benign   KNEE SURGERY  1988   right   SHOULDER ARTHROSCOPY WITH LABRAL REPAIR Right 08/28/2012   Procedure: RIGHT SHOULDER ARTHROSCOPY WITH LABRAL DEBRIDEMENT, ASPIRATION AND DECOMPRESSION OF PARALABRAL CYST;  Surgeon: Senaida Lange, MD;  Location: [facility];  Service: Orthopedics;  Laterality: Right;   TOTAL SHOULDER ARTHROPLASTY Right 03/24/2020   Procedure: RIGHT REVERSE SHOULDER ARTHROPLASTY;  Surgeon: Francena Hanly, MD;  Location: [facility];  Service: Orthopedics;  Laterality: Right;    TOTAL SHOULDER ARTHROPLASTY Left 02/23/2021   Procedure: TOTAL SHOULDER ARTHROPLASTY;  Surgeon: Francena Hanly, MD;  Location: [facility];  Service: Orthopedics;  Laterality: Left;   TOTAL SHOULDER REVISION Left 02/07/2023   Procedure: Excision Left shoulder nodule; Conversion of left anatomic shoulder arthroplasty to reverse shoulder arthroplasty;  Surgeon: Francena Hanly, MD;  Location: [facility];  Service: Orthopedics;  Laterality: Left;   Patient Active Problem List   Diagnosis Date Noted   Upper respiratory infection 03/01/2023   OA (osteoarthritis) 03/01/2023   Hyponatremia 03/01/2023    Hypercalciuria 02/02/2023   Neurocognitive deficits 07/07/2021   DDD (degenerative disc disease), cervical 04/13/2021   Osteoporosis without current pathological fracture 10/28/2020   Nonallopathic lesion of sacral region 02/26/2020   Hot flashes 02/23/2019   Other insomnia 02/23/2019   Primary osteoarthritis involving multiple joints 02/23/2019   Cystic acne vulgaris 02/23/2019   Bunion of right foot 02/23/2019   Varicose 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 02/23/2019   Multiple atypical nevi 02/23/2019   Polyarthralgia 08/25/2014   Mitral valve prolapse 03/03/2009    PCP: Carilyn Goodpasture [name]   REFERRING PROVIDER: Kaylyn Layer Supple  REFERRING DIAG:  Diagnosis  [phone] (ICD-10-CM) - Presence of left artificial shoulder joint    THERAPY DIAG:  Acute pain of left shoulder  Stiffness of left shoulder, not elsewhere classified  Rationale for Evaluation and Treatment: Rehabilitation  ONSET DATE:  DOS: 02/07/2023  SUBJECTIVE:  SUBJECTIVE STATEMENT: Minimal pain. Denies N/T. Worse in neck today.    From Initial Evaluation: Patient had a left standard total shoulder replacement last year.  She did well for about 6 months then began having progressive pain in the shoulder.  She had to have a shoulder revision with a reverse total shoulder.  At this time her pain is well-controlled.  She is also had a right shoulder replaced.  When she had that replaced she had significant pain.  She reports this 1 is better.  She is wearing her sling outside the house and when sleeping.  She is otherwise has been wearing her sling.  She has increased pain when she moves her arm.  She has been compliant with her HEP so far. Hand dominance: Right  PERTINENT HISTORY: Multijoint arthritis including knees and hips.   Osteoporosis, right shoulder replacement, mitral valve prolapse  PAIN:  Are you having pain? Yes: NPRS scale: 4/10  Pain location: Right anterior shoulder but runs down lateral arm to elbow Pain description: Aching Aggravating factors: Use of the arm Relieving factors: Not using the arm is much  PRECAUTIONS: Shoulder  RED FLAGS: None   WEIGHT BEARING RESTRICTIONS: No  FALLS:  Has patient fallen in last 6 months? No  LIVING ENVIRONMENT:   OCCUPATION: Nurse   PLOF: Independent  PATIENT GOALS:  Patient wants to be functional   NEXT MD VISIT:  2 weeks prior    OBJECTIVE:  Note: Objective measures were completed at Evaluation unless otherwise noted.  DIAGNOSTIC FINDINGS:  Nothing post op   PATIENT SURVEYS :  FOTO    COGNITION: Overall cognitive status: Within functional limits for tasks assessed     SENSATION: Denies parathesias   POSTURE: Good   UPPER EXTREMITY ROM:   Passive ROM Right eval Left eval Left 1/21  Shoulder flexion  85 106  Shoulder extension     Shoulder abduction     Shoulder adduction     Shoulder internal rotation  Can sit on body comfortably    Shoulder external rotation  20 26  Elbow flexion     Elbow extension     Wrist flexion     Wrist extension     Wrist ulnar deviation     Wrist radial deviation     Wrist pronation     Wrist supination     (Blank rows = not tested)  UPPER EXTREMITY MMT:  MMT Right eval Left eval  Shoulder flexion    Shoulder extension    Shoulder abduction    Shoulder adduction    Shoulder internal rotation    Shoulder external rotation    Middle trapezius    Lower trapezius    Elbow flexion    Elbow extension    Wrist flexion    Wrist extension    Wrist ulnar deviation    Wrist radial deviation    Wrist pronation    Wrist supination    Grip strength (lbs)    (Blank rows = not tested) Not tested 2nd to recent surgery      PALPATION:  No unexpected TTP  TREATMENT DATE: 1/31: MANUA: STM periscapular, upper trap Sidelying resisted scap pro/retract, elevate/depress Seated pelvic tilt+retraction, added cervical rotatoin Table slide flexion with scap retraction upon return Finger ladder  1/28 Manual: PROM into all planes per protocl; trigger point release to pec and anterior shoulder.   SHoulder press x15 1 lb 2x15 2lbs  Shoulder flexion 2x10 1 lb   Supine ER 2x10   Fingler ladder 2x10      01/25: Reviewed response to prior Rx, HEP compliance, and pain levels  STM to L UT, sub scap, lats, pec and biceps.   Cervical stretches  Pt received L shoulder PROM in flexion, abd, and ER per pt and tissue tolerance w/n protocol ranges.   01/21: Reviewed response to prior Rx, HEP compliance, and pain levels  Pt received L shoulder PROM in flexion, abd, and ER per pt and tissue tolerance w/n protocol ranges  Supine flexion AAROM with hands clasped w/n protocol range x10 reps Supine wand ER 2x10 w/n protocol range Seated pulleys in flexion 2x10 w/n protocol range Standing abd isometrics with 5 sec hold x 10 reps Pendulums cw and ccw  See below for pt education   PATIENT EDUCATION: Education details:  HEP, exercise form, rationale of interventions, protocol restrictions and limitations, protocol ranges, and ROM findings Person educated: Patient Education method: Explanation, Demonstration, Tactile cues, Verbal cues Education comprehension: verbalized understanding, returned demonstration, verbal cues required, tactile cues required, and needs further education  HOME EXERCISE PROGRAM: Access Code: [email or web address] URL: [email or web address]   ASSESSMENT:  CLINICAL IMPRESSION: Encouraged pt to use arm with respect to pain/discomfort. Excellent progression. Significant amoutn of STM today to decrease spasm and pt reported  decrease in concordant pain.    OBJECTIVE IMPAIRMENTS:  decreased ROM, decreased strength, impaired UE functional use, and pain.   ACTIVITY LIMITATIONS: carrying, lifting, sleeping, bathing, toileting, dressing, self feeding, reach over head, and hygiene/grooming  PARTICIPATION LIMITATIONS: meal prep, cleaning, laundry, driving, shopping, community activity, occupation, and yard work  PERSONAL FACTORS: 1-2 comorbidities: previous replacement on the same shoulder osteoporosis; multi joint OA   are also affecting patient's functional outcome.   REHAB POTENTIAL: Good  CLINICAL DECISION MAKING: Stable/uncomplicated  EVALUATION COMPLEXITY: Low  GOALS: Goals reviewed with patient? Yes  SHORT TERM GOALS: Target date: 04/16/2023    Patient will increase passive flexion to 115 degrees  Baseline: Goal status: INITIAL  2.  Patient will increase left shoulder  ER to 30 degrees  Baseline:  Goal status: INITIAL  3.  Patient will use her left arm for ADL's below 90 degrees with > 2/10 pain  Baseline:  Goal status: INITIAL   LONG TERM GOALS: Target date: 05/28/2023    The patient will reach overhead with left arm without pain in order to perform ADL's  Baseline:  Goal status: INITIAL  2.  Patient will each behind her head without pain in order to per from her ADL's  Baseline:  Goal status: INITIAL  3.  Patient will reach behind her back to L3 without pain in order to perform ADL's  Baseline:  Goal status: INITIAL   PLAN: PT FREQUENCY: 2x/week  PT DURATION: 8 weeks  PLANNED INTERVENTIONS: 97110-Therapeutic exercises, 97530- Therapeutic activity, [id number]- Neuromuscular re-education, 97535- Self Care, [id number]- Manual therapy, 7113- Aquatic Therapy, 97014- Electrical stimulation (unattended), 97035- Ultrasound, Patient/Family education, Taping, Dry Needling, DME instructions, Cryotherapy, and Moist heat   PLAN FOR NEXT SESSION:  Cont per Dr. Dub Mikes Reverse total shoulder protocol  ( will be on Dave's desk )  Samwise Eckardt C. Bathsheba Durrett PT, DPT 03/22/23 3:15 PM

## 2023-03-23 ENCOUNTER — Other Ambulatory Visit (HOSPITAL_COMMUNITY): Payer: Self-pay

## 2023-03-25 ENCOUNTER — Ambulatory Visit (HOSPITAL_BASED_OUTPATIENT_CLINIC_OR_DEPARTMENT_OTHER): Payer: 59 | Admitting: Physical Therapy

## 2023-03-26 ENCOUNTER — Encounter (HOSPITAL_BASED_OUTPATIENT_CLINIC_OR_DEPARTMENT_OTHER): Payer: 59 | Admitting: Physical Therapy

## 2023-03-27 ENCOUNTER — Encounter: Payer: Self-pay | Admitting: Psychology

## 2023-03-27 ENCOUNTER — Encounter: Payer: 59 | Attending: Psychology | Admitting: Psychology

## 2023-03-27 ENCOUNTER — Ambulatory Visit (HOSPITAL_BASED_OUTPATIENT_CLINIC_OR_DEPARTMENT_OTHER): Payer: 59

## 2023-03-27 ENCOUNTER — Encounter (HOSPITAL_BASED_OUTPATIENT_CLINIC_OR_DEPARTMENT_OTHER): Payer: Self-pay

## 2023-03-27 DIAGNOSIS — M25512 Pain in left shoulder: Secondary | ICD-10-CM | POA: Insufficient documentation

## 2023-03-27 DIAGNOSIS — G4709 Other insomnia: Secondary | ICD-10-CM | POA: Insufficient documentation

## 2023-03-27 DIAGNOSIS — G894 Chronic pain syndrome: Secondary | ICD-10-CM | POA: Diagnosis not present

## 2023-03-27 DIAGNOSIS — G3184 Mild cognitive impairment, so stated: Secondary | ICD-10-CM | POA: Diagnosis not present

## 2023-03-27 DIAGNOSIS — M25612 Stiffness of left shoulder, not elsewhere classified: Secondary | ICD-10-CM

## 2023-03-27 NOTE — Therapy (Signed)
 OUTPATIENT PHYSICAL THERAPY UPPER EXTREMITY TREATMENT   Patient Name: Tara [name] MRN: [id number] DOB:1960/08/26, 63 y.o., female Today's Date: 03/27/2023  END OF SESSION:  PT End of Session - 03/27/23 1021     Visit Number 8    Number of Visits 24    Date for PT Re-Evaluation 05/28/23    PT Start Time 1020    PT Stop Time 1108    PT Time Calculation (min) 48 min    Activity Tolerance Patient tolerated treatment well    Behavior During Therapy [facility] for tasks assessed/performed                Past Medical History:  Diagnosis Date   Anemia    Arthritis    Hyperlipidemia    Insomnia    Mitral valve prolapse    PONV (postoperative nausea and vomiting)    Past Surgical History:  Procedure Laterality Date   AUGMENTATION MAMMAPLASTY Bilateral    Dec 2019   BREAST BIOPSY Right    axillary node benign   KNEE SURGERY  1988   right   SHOULDER ARTHROSCOPY WITH LABRAL REPAIR Right 08/28/2012   Procedure: RIGHT SHOULDER ARTHROSCOPY WITH LABRAL DEBRIDEMENT, ASPIRATION AND DECOMPRESSION OF PARALABRAL CYST;  Surgeon: Franky CHRISTELLA Pointer, MD;  Location: [facility];  Service: Orthopedics;  Laterality: Right;   TOTAL SHOULDER ARTHROPLASTY Right 03/24/2020   Procedure: RIGHT REVERSE SHOULDER ARTHROPLASTY;  Surgeon: Pointer Franky, MD;  Location: [facility];  Service: Orthopedics;  Laterality: Right;    TOTAL SHOULDER ARTHROPLASTY Left 02/23/2021   Procedure: TOTAL SHOULDER ARTHROPLASTY;  Surgeon: Pointer Franky, MD;  Location: [facility];  Service: Orthopedics;  Laterality: Left;   TOTAL SHOULDER REVISION Left 02/07/2023   Procedure: Excision Left shoulder nodule; Conversion of left anatomic shoulder arthroplasty to reverse shoulder arthroplasty;  Surgeon: Pointer Franky, MD;  Location: [facility];  Service: Orthopedics;  Laterality: Left;   Patient Active Problem List   Diagnosis Date Noted   Upper respiratory infection 03/01/2023   OA (osteoarthritis) 03/01/2023   Hyponatremia 03/01/2023    Hypercalciuria 02/02/2023   Neurocognitive deficits 07/07/2021   DDD (degenerative disc disease), cervical 04/13/2021   Osteoporosis without current pathological fracture 10/28/2020   Nonallopathic lesion of sacral region 02/26/2020   Hot flashes 02/23/2019   Other insomnia 02/23/2019   Primary osteoarthritis involving multiple joints 02/23/2019   Cystic acne vulgaris 02/23/2019   Bunion of right foot 02/23/2019   Varicose 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 02/23/2019   Multiple atypical nevi 02/23/2019   Polyarthralgia 08/25/2014   Mitral valve prolapse 03/03/2009    PCP: Roxan [name]   REFERRING PROVIDER: JONETTA Franky Supple  REFERRING DIAG:  Diagnosis  [phone] (ICD-10-CM) - Presence of left artificial shoulder joint    THERAPY DIAG:  Acute pain of left shoulder  Stiffness of left shoulder, not elsewhere classified  Rationale for Evaluation and Treatment: Rehabilitation  ONSET DATE:  DOS: 02/07/2023  SUBJECTIVE:  SUBJECTIVE STATEMENT: 4/10 pain level at entry. Feels discomfort after performing exercises and when focusing on posture during the day. Saw MD yesterday who elected to wait on removing suture protruding from inferior margin of incision. Wants to wait until next f/u.    From Initial Evaluation: Patient had a left standard total shoulder replacement last year.  She did well for about 6 months then began having progressive pain in the shoulder.  She had to have a shoulder revision with a reverse total shoulder.  At this time her pain is well-controlled.  She is also had a right shoulder replaced.  When she had that replaced she had significant pain.  She reports this 1 is better.  She is wearing her sling outside the house and when sleeping.  She is otherwise has been wearing her  sling.  She has increased pain when she moves her arm.  She has been compliant with her HEP so far. Hand dominance: Right  PERTINENT HISTORY: Multijoint arthritis including knees and hips.  Osteoporosis, right shoulder replacement, mitral valve prolapse  PAIN:  Are you having pain? Yes: NPRS scale: 4/10  Pain location: Right anterior shoulder but runs down lateral arm to elbow Pain description: Aching Aggravating factors: Use of the arm Relieving factors: Not using the arm is much  PRECAUTIONS: Shoulder  RED FLAGS: None   WEIGHT BEARING RESTRICTIONS: No  FALLS:  Has patient fallen in last 6 months? No  LIVING ENVIRONMENT:   OCCUPATION: Nurse   PLOF: Independent  PATIENT GOALS:  Patient wants to be functional   NEXT MD VISIT:  2 weeks prior    OBJECTIVE:  Note: Objective measures were completed at Evaluation unless otherwise noted.  DIAGNOSTIC FINDINGS:  Nothing post op   PATIENT SURVEYS :  FOTO    COGNITION: Overall cognitive status: Within functional limits for tasks assessed     SENSATION: Denies parathesias   POSTURE: Good   UPPER EXTREMITY ROM:   Passive ROM Right eval Left eval Left 1/21  Shoulder flexion  85 106  Shoulder extension     Shoulder abduction     Shoulder adduction     Shoulder internal rotation  Can sit on body comfortably    Shoulder external rotation  20 26  Elbow flexion     Elbow extension     Wrist flexion     Wrist extension     Wrist ulnar deviation     Wrist radial deviation     Wrist pronation     Wrist supination     (Blank rows = not tested)  UPPER EXTREMITY MMT:  MMT Right eval Left eval  Shoulder flexion    Shoulder extension    Shoulder abduction    Shoulder adduction    Shoulder internal rotation    Shoulder external rotation    Middle trapezius    Lower trapezius    Elbow flexion    Elbow extension    Wrist flexion    Wrist extension    Wrist ulnar deviation    Wrist radial deviation     Wrist pronation    Wrist supination    Grip strength (lbs)    (Blank rows = not tested) Not tested 2nd to recent surgery      PALPATION:  No unexpected TTP  TREATMENT DATE:  2/5: PROM L shoulder Supine rhythmic stabilization Supine active flexion x10 Sidelying abduction (with minA) STM to L UT, LS, periscapular,  lats, teres minor.  UT stretch Posterior shoulder rolls seated    MANUA: STM periscapular, upper trap Sidelying resisted scap pro/retract, elevate/depress Seated pelvic tilt+retraction, added cervical rotatoin Table slide flexion with scap retraction upon return Finger ladder   1/31: MANUA: STM periscapular, upper trap Sidelying resisted scap pro/retract, elevate/depress Seated pelvic tilt+retraction, added cervical rotatoin Table slide flexion with scap retraction upon return Finger ladder  1/28 Manual: PROM into all planes per protocl; trigger point release to pec and anterior shoulder.   SHoulder press x15 1 lb 2x15 2lbs  Shoulder flexion 2x10 1 lb   Supine ER 2x10   Fingler ladder 2x10      01/25: Reviewed response to prior Rx, HEP compliance, and pain levels  STM to L UT, sub scap, lats, pec and biceps.   Cervical stretches  Pt received L shoulder PROM in flexion, abd, and ER per pt and tissue tolerance w/n protocol ranges.   01/21: Reviewed response to prior Rx, HEP compliance, and pain levels  Pt received L shoulder PROM in flexion, abd, and ER per pt and tissue tolerance w/n protocol ranges  Supine flexion AAROM with hands clasped w/n protocol range x10 reps Supine wand ER 2x10 w/n protocol range Seated pulleys in flexion 2x10 w/n protocol range Standing abd isometrics with 5 sec hold x 10 reps Pendulums cw and ccw  See below for pt education   PATIENT EDUCATION: Education details:  HEP, exercise  form, rationale of interventions, protocol restrictions and limitations, protocol ranges, and ROM findings Person educated: Patient Education method: Explanation, Demonstration, Tactile cues, Verbal cues Education comprehension: verbalized understanding, returned demonstration, verbal cues required, tactile cues required, and needs further education  HOME EXERCISE PROGRAM: Access Code: [email or web address] URL: [email or web address]   ASSESSMENT:  CLINICAL IMPRESSION: Pt with ongoing restrictions in periscapular mm and UT. Spent time on MT to address. She is especially tender in infraspinatus area. Improving with available ROM, though weakness evident with abduction movements. Will continue to progress as tolerated with ROM and strength.   OBJECTIVE IMPAIRMENTS:  decreased ROM, decreased strength, impaired UE functional use, and pain.   ACTIVITY LIMITATIONS: carrying, lifting, sleeping, bathing, toileting, dressing, self feeding, reach over head, and hygiene/grooming  PARTICIPATION LIMITATIONS: meal prep, cleaning, laundry, driving, shopping, community activity, occupation, and yard work  PERSONAL FACTORS: 1-2 comorbidities: previous replacement on the same shoulder osteoporosis; multi joint OA   are also affecting patient's functional outcome.   REHAB POTENTIAL: Good  CLINICAL DECISION MAKING: Stable/uncomplicated  EVALUATION COMPLEXITY: Low  GOALS: Goals reviewed with patient? Yes  SHORT TERM GOALS: Target date: 04/16/2023    Patient will increase passive flexion to 115 degrees  Baseline: Goal status: INITIAL  2.  Patient will increase left shoulder  ER to 30 degrees  Baseline:  Goal status: INITIAL  3.  Patient will use her left arm for ADL's below 90 degrees with > 2/10 pain  Baseline:  Goal status: INITIAL   LONG TERM GOALS: Target date: 05/28/2023    The patient will reach overhead with left arm without pain in order to perform ADL's  Baseline:  Goal  status: INITIAL  2.  Patient will each behind her head without pain in order to per from her ADL's  Baseline:  Goal status: INITIAL  3.  Patient will reach behind her back to L3 without pain in order  to perform ADL's  Baseline:  Goal status: INITIAL   PLAN: PT FREQUENCY: 2x/week  PT DURATION: 8 weeks  PLANNED INTERVENTIONS: 97110-Therapeutic exercises, 97530- Therapeutic activity, 97112- Neuromuscular re-education, 97535- Self Care, [id number]- Manual therapy, 7113- Aquatic Therapy, 97014- Electrical stimulation (unattended), 97035- Ultrasound, Patient/Family education, Taping, Dry Needling, DME instructions, Cryotherapy, and Moist heat   PLAN FOR NEXT SESSION:  Cont per Dr. Rosea Reverse total shoulder protocol ( will be on Dave's desk )  Asberry Rodes, PTA  03/27/23 12:05 PM
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
## 2023-03-29 ENCOUNTER — Encounter (HOSPITAL_BASED_OUTPATIENT_CLINIC_OR_DEPARTMENT_OTHER): Payer: 59 | Admitting: Physical Therapy

## 2023-03-29 ENCOUNTER — Other Ambulatory Visit (HOSPITAL_BASED_OUTPATIENT_CLINIC_OR_DEPARTMENT_OTHER): Payer: Self-pay

## 2023-03-30 ENCOUNTER — Ambulatory Visit (HOSPITAL_BASED_OUTPATIENT_CLINIC_OR_DEPARTMENT_OTHER): Payer: 59 | Admitting: Physical Therapy

## 2023-03-30 ENCOUNTER — Encounter (HOSPITAL_BASED_OUTPATIENT_CLINIC_OR_DEPARTMENT_OTHER): Payer: 59 | Admitting: Physical Therapy

## 2023-03-30 DIAGNOSIS — G894 Chronic pain syndrome: Secondary | ICD-10-CM | POA: Diagnosis not present

## 2023-03-30 DIAGNOSIS — G3184 Mild cognitive impairment, so stated: Secondary | ICD-10-CM | POA: Diagnosis not present

## 2023-03-30 DIAGNOSIS — M25612 Stiffness of left shoulder, not elsewhere classified: Secondary | ICD-10-CM

## 2023-03-30 DIAGNOSIS — G4709 Other insomnia: Secondary | ICD-10-CM | POA: Diagnosis not present

## 2023-03-30 DIAGNOSIS — M25512 Pain in left shoulder: Secondary | ICD-10-CM

## 2023-03-30 NOTE — Therapy (Signed)
 OUTPATIENT PHYSICAL THERAPY UPPER EXTREMITY TREATMENT   Patient Name: Tara [name] MRN: [id number] DOB:[birth year], 63 y.o., female Today's Date: 03/30/2023  END OF SESSION:  PT End of Session - 03/30/23 1026     Visit Number 9    Number of Visits 24    Date for PT Re-Evaluation 05/28/23    PT Start Time 0915    PT Stop Time 0955    PT Time Calculation (min) 40 min    Activity Tolerance Patient tolerated treatment well    Behavior During Therapy [facility] for tasks assessed/performed                 Past Medical History:  Diagnosis Date   Anemia    Arthritis    Hyperlipidemia    Insomnia    Mitral valve prolapse    PONV (postoperative nausea and vomiting)    Past Surgical History:  Procedure Laterality Date   AUGMENTATION MAMMAPLASTY Bilateral    Dec 2019   BREAST BIOPSY Right    axillary node benign   KNEE SURGERY  1988   right   SHOULDER ARTHROSCOPY WITH LABRAL REPAIR Right 08/28/2012   Procedure: RIGHT SHOULDER ARTHROSCOPY WITH LABRAL DEBRIDEMENT, ASPIRATION AND DECOMPRESSION OF PARALABRAL CYST;  Surgeon: Franky CHRISTELLA Pointer, MD;  Location: [facility];  Service: Orthopedics;  Laterality: Right;   TOTAL SHOULDER ARTHROPLASTY Right 03/24/2020   Procedure: RIGHT REVERSE SHOULDER ARTHROPLASTY;  Surgeon: Pointer Franky, MD;  Location: [facility];  Service: Orthopedics;  Laterality: Right;    TOTAL SHOULDER ARTHROPLASTY Left 02/23/2021   Procedure: TOTAL SHOULDER ARTHROPLASTY;  Surgeon: Pointer Franky, MD;  Location: [facility];  Service: Orthopedics;  Laterality: Left;   TOTAL SHOULDER REVISION Left 02/07/2023   Procedure: Excision Left shoulder nodule; Conversion of left anatomic shoulder arthroplasty to reverse shoulder arthroplasty;  Surgeon: Pointer Franky, MD;  Location: [facility];  Service: Orthopedics;  Laterality: Left;   Patient Active Problem List   Diagnosis Date Noted   Upper respiratory infection 03/01/2023   OA (osteoarthritis) 03/01/2023   Hyponatremia 03/01/2023    Hypercalciuria 02/02/2023   Neurocognitive deficits 07/07/2021   DDD (degenerative disc disease), cervical 04/13/2021   Osteoporosis without current pathological fracture 10/28/2020   Nonallopathic lesion of sacral region 02/26/2020   Hot flashes 02/23/2019   Other insomnia 02/23/2019   Primary osteoarthritis involving multiple joints 02/23/2019   Cystic acne vulgaris 02/23/2019   Bunion of right foot 02/23/2019   Varicose 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 02/23/2019   Multiple atypical nevi 02/23/2019   Polyarthralgia 08/25/2014   Mitral valve prolapse 03/03/2009    PCP: Roxan [name]   REFERRING PROVIDER: JONETTA Franky Supple  REFERRING DIAG:  Diagnosis  [phone] (ICD-10-CM) - Presence of left artificial shoulder joint    THERAPY DIAG:  Acute pain of left shoulder  Stiffness of left shoulder, not elsewhere classified  Rationale for Evaluation and Treatment: Rehabilitation  ONSET DATE:  DOS: 02/07/2023  SUBJECTIVE:  SUBJECTIVE STATEMENT: Pt reports to PT with increased tension and a trigger point noted behind scapula. She states that she is trying to be conscious about posture.    From Initial Evaluation: Patient had a left standard total shoulder replacement last year.  She did well for about 6 months then began having progressive pain in the shoulder.  She had to have a shoulder revision with a reverse total shoulder.  At this time her pain is well-controlled.  She is also had a right shoulder replaced.  When she had that replaced she had significant pain.  She reports this 1 is better.  She is wearing her sling outside the house and when sleeping.  She is otherwise has been wearing her sling.  She has increased pain when she moves her arm.  She has been compliant with her HEP so  far. Hand dominance: Right  PERTINENT HISTORY: Multijoint arthritis including knees and hips.  Osteoporosis, right shoulder replacement, mitral valve prolapse  PAIN:  Are you having pain? Yes: NPRS scale: 4/10  Pain location: Right anterior shoulder but runs down lateral arm to elbow Pain description: Aching Aggravating factors: Use of the arm Relieving factors: Not using the arm is much  PRECAUTIONS: Shoulder  RED FLAGS: None   WEIGHT BEARING RESTRICTIONS: No  FALLS:  Has patient fallen in last 6 months? No  LIVING ENVIRONMENT:   OCCUPATION: Nurse   PLOF: Independent  PATIENT GOALS:  Patient wants to be functional   NEXT MD VISIT:  2 weeks prior    OBJECTIVE:  Note: Objective measures were completed at Evaluation unless otherwise noted.  DIAGNOSTIC FINDINGS:  Nothing post op   PATIENT SURVEYS :  FOTO    COGNITION: Overall cognitive status: Within functional limits for tasks assessed     SENSATION: Denies parathesias   POSTURE: Good   UPPER EXTREMITY ROM:   Passive ROM Right eval Left eval Left 1/21  Shoulder flexion  85 106  Shoulder extension     Shoulder abduction     Shoulder adduction     Shoulder internal rotation  Can sit on body comfortably    Shoulder external rotation  20 26  Elbow flexion     Elbow extension     Wrist flexion     Wrist extension     Wrist ulnar deviation     Wrist radial deviation     Wrist pronation     Wrist supination     (Blank rows = not tested)  UPPER EXTREMITY MMT:  MMT Right eval Left eval  Shoulder flexion    Shoulder extension    Shoulder abduction    Shoulder adduction    Shoulder internal rotation    Shoulder external rotation    Middle trapezius    Lower trapezius    Elbow flexion    Elbow extension    Wrist flexion    Wrist extension    Wrist ulnar deviation    Wrist radial deviation    Wrist pronation    Wrist supination    Grip strength (lbs)    (Blank rows = not tested) Not  tested 2nd to recent surgery      PALPATION:  No unexpected TTP  TREATMENT DATE: 03/30/2023: PROM L shoulder Discussed posture during work and daily tasks with tactile cues provided. STM to UT Trigger point release to rhomboids major/ minor with movement.   2/5: PROM L shoulder Supine rhythmic stabilization Supine active flexion x10 Sidelying abduction (with minA) STM to L UT, LS, periscapular,  lats, teres minor.  UT stretch Posterior shoulder rolls seated    MANUA: STM periscapular, upper trap Sidelying resisted scap pro/retract, elevate/depress Seated pelvic tilt+retraction, added cervical rotatoin Table slide flexion with scap retraction upon return Finger ladder   1/31: MANUA: STM periscapular, upper trap Sidelying resisted scap pro/retract, elevate/depress Seated pelvic tilt+retraction, added cervical rotatoin Table slide flexion with scap retraction upon return Finger ladder  1/28 Manual: PROM into all planes per protocl; trigger point release to pec and anterior shoulder.   SHoulder press x15 1 lb 2x15 2lbs  Shoulder flexion 2x10 1 lb   Supine ER 2x10   Fingler ladder 2x10      01/25: Reviewed response to prior Rx, HEP compliance, and pain levels  STM to L UT, sub scap, lats, pec and biceps.   Cervical stretches  Pt received L shoulder PROM in flexion, abd, and ER per pt and tissue tolerance w/n protocol ranges.   01/21: Reviewed response to prior Rx, HEP compliance, and pain levels  Pt received L shoulder PROM in flexion, abd, and ER per pt and tissue tolerance w/n protocol ranges  Supine flexion AAROM with hands clasped w/n protocol range x10 reps Supine wand ER 2x10 w/n protocol range Seated pulleys in flexion 2x10 w/n protocol range Standing abd isometrics with 5 sec hold x 10 reps Pendulums cw and ccw  See  below for pt education   PATIENT EDUCATION: Education details:  HEP, exercise form, rationale of interventions, protocol restrictions and limitations, protocol ranges, and ROM findings Person educated: Patient Education method: Explanation, Demonstration, Tactile cues, Verbal cues Education comprehension: verbalized understanding, returned demonstration, verbal cues required, tactile cues required, and needs further education  HOME EXERCISE PROGRAM: Access Code: [email or web address] URL: [email or web address]   ASSESSMENT:  CLINICAL IMPRESSION: Pt with ongoing restrictions in periscapular mm and UT. Spent time on MT to address. Pt reports improvements with ROM with no pain with ongoing STM with movement. Improving with available ROM. Discussed TPDN to area once cleared by MD. Will try to seek approval for rhomboids and subscap. Also discussed and reviewed postural control while seated. 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 Will continue to progress as tolerated with ROM and strength.   OBJECTIVE IMPAIRMENTS:  decreased ROM, decreased strength, impaired UE functional use, and pain.   ACTIVITY LIMITATIONS: carrying, lifting, sleeping, bathing, toileting, dressing, self feeding, reach over head, and hygiene/grooming  PARTICIPATION LIMITATIONS: meal prep, cleaning, laundry, driving, shopping, community activity, occupation, and yard work  PERSONAL FACTORS: 1-2 comorbidities: previous replacement on the same shoulder osteoporosis; multi joint OA   are also affecting patient's functional outcome.   REHAB POTENTIAL: Good  CLINICAL DECISION MAKING: Stable/uncomplicated  EVALUATION COMPLEXITY: Low  GOALS: Goals reviewed with patient? Yes  SHORT TERM GOALS: Target date: 04/16/2023    Patient will increase passive flexion to 115 degrees   Baseline: Goal status: INITIAL  2.  Patient will increase left shoulder  ER to 30 degrees  Baseline:  Goal status: INITIAL  3.  Patient will use her left arm for ADL's below  90 degrees with > 2/10 pain  Baseline:  Goal status: INITIAL   LONG TERM GOALS: Target date: 05/28/2023    The patient will reach overhead with left arm without pain in order to perform ADL's  Baseline:  Goal status: INITIAL  2.  Patient will each behind her head without pain in order to per from her ADL's  Baseline:  Goal status: INITIAL  3.  Patient will reach behind her back to L3 without pain in order to perform ADL's  Baseline:  Goal status: INITIAL   PLAN: PT FREQUENCY: 2x/week  PT DURATION: 8 weeks  PLANNED INTERVENTIONS: 97110-Therapeutic exercises, 97530- Therapeutic activity, 97112- Neuromuscular re-education, 97535- Self Care, 02859- Manual therapy, 7113- Aquatic Therapy, 97014- Electrical stimulation (unattended), 97035- Ultrasound, Patient/Family education, Taping, Dry Needling, DME instructions, Cryotherapy, and Moist heat   PLAN FOR NEXT SESSION:  Cont per Dr. Rosea Reverse total shoulder protocol ( will be on Dave's desk )  Rojean Batten PT, DPT 03/30/23  10:33 AM

## 2023-04-04 ENCOUNTER — Encounter (HOSPITAL_BASED_OUTPATIENT_CLINIC_OR_DEPARTMENT_OTHER): Payer: Self-pay

## 2023-04-04 ENCOUNTER — Ambulatory Visit (HOSPITAL_BASED_OUTPATIENT_CLINIC_OR_DEPARTMENT_OTHER): Payer: 59

## 2023-04-04 DIAGNOSIS — M25612 Stiffness of left shoulder, not elsewhere classified: Secondary | ICD-10-CM

## 2023-04-04 DIAGNOSIS — G894 Chronic pain syndrome: Secondary | ICD-10-CM | POA: Diagnosis not present

## 2023-04-04 DIAGNOSIS — G4709 Other insomnia: Secondary | ICD-10-CM | POA: Diagnosis not present

## 2023-04-04 DIAGNOSIS — M25512 Pain in left shoulder: Secondary | ICD-10-CM

## 2023-04-04 DIAGNOSIS — G3184 Mild cognitive impairment, so stated: Secondary | ICD-10-CM | POA: Diagnosis not present

## 2023-04-04 NOTE — Therapy (Signed)
OUTPATIENT PHYSICAL THERAPY UPPER EXTREMITY TREATMENT   Patient Name: Tara [name] MRN: [id number] DOB:1960/12/03, 63 y.o., female Today's Date: 04/04/2023  END OF SESSION:  PT End of Session - 04/04/23 1530     Visit Number 10    Number of Visits 24    Date for PT Re-Evaluation 05/28/23    PT Start Time 1518    PT Stop Time 1600    PT Time Calculation (min) 42 min    Activity Tolerance Patient tolerated treatment well    Behavior During Therapy [facility] for tasks assessed/performed                  Past Medical History:  Diagnosis Date   Anemia    Arthritis    Hyperlipidemia    Insomnia    Mitral valve prolapse    PONV (postoperative nausea and vomiting)    Past Surgical History:  Procedure Laterality Date   AUGMENTATION MAMMAPLASTY Bilateral    Dec 2019   BREAST BIOPSY Right    axillary node benign   KNEE SURGERY  1988   right   SHOULDER ARTHROSCOPY WITH LABRAL REPAIR Right 08/28/2012   Procedure: RIGHT SHOULDER ARTHROSCOPY WITH LABRAL DEBRIDEMENT, ASPIRATION AND DECOMPRESSION OF PARALABRAL CYST;  Surgeon: Senaida Lange, MD;  Location: [facility];  Service: Orthopedics;  Laterality: Right;   TOTAL SHOULDER ARTHROPLASTY Right 03/24/2020   Procedure: RIGHT REVERSE SHOULDER ARTHROPLASTY;  Surgeon: Francena Hanly, MD;  Location: [facility];  Service: Orthopedics;  Laterality: Right;    TOTAL SHOULDER ARTHROPLASTY Left 02/23/2021   Procedure: TOTAL SHOULDER ARTHROPLASTY;  Surgeon: Francena Hanly, MD;  Location: [facility];  Service: Orthopedics;  Laterality: Left;   TOTAL SHOULDER REVISION Left 02/07/2023   Procedure: Excision Left shoulder nodule; Conversion of left anatomic shoulder arthroplasty to reverse shoulder arthroplasty;  Surgeon: Francena Hanly, MD;  Location: [facility];  Service: Orthopedics;  Laterality: Left;   Patient Active Problem List   Diagnosis Date Noted   Upper respiratory infection 03/01/2023   OA (osteoarthritis) 03/01/2023   Hyponatremia 03/01/2023    Hypercalciuria 02/02/2023   Neurocognitive deficits 07/07/2021   DDD (degenerative disc disease), cervical 04/13/2021   Osteoporosis without current pathological fracture 10/28/2020   Nonallopathic lesion of sacral region 02/26/2020   Hot flashes 02/23/2019   Other insomnia 02/23/2019   Primary osteoarthritis involving multiple joints 02/23/2019   Cystic acne vulgaris 02/23/2019   Bunion of right foot 02/23/2019   Varicose 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 02/23/2019   Multiple atypical nevi 02/23/2019   Polyarthralgia 08/25/2014   Mitral valve prolapse 03/03/2009    PCP: Carilyn Goodpasture [name]   REFERRING PROVIDER: Kaylyn Layer Supple  REFERRING DIAG:  Diagnosis  [phone] (ICD-10-CM) - Presence of left artificial shoulder joint    THERAPY DIAG:  Acute pain of left shoulder  Stiffness of left shoulder, not elsewhere classified  Rationale for Evaluation and Treatment: Rehabilitation  ONSET DATE:  DOS: 02/07/2023  SUBJECTIVE:  SUBJECTIVE STATEMENT: 3/10 pain level at entry. She continues to have tightness and soreness in shoulder and shoulder blade.    From Initial Evaluation: Patient had a left standard total shoulder replacement last year.  She did well for about 6 months then began having progressive pain in the shoulder.  She had to have a shoulder revision with a reverse total shoulder.  At this time her pain is well-controlled.  She is also had a right shoulder replaced.  When she had that replaced she had significant pain.  She reports this 1 is better.  She is wearing her sling outside the house and when sleeping.  She is otherwise has been wearing her sling.  She has increased pain when she moves her arm.  She has been compliant with her HEP so far. Hand dominance: Right  PERTINENT  HISTORY: Multijoint arthritis including knees and hips.  Osteoporosis, right shoulder replacement, mitral valve prolapse  PAIN:  Are you having pain? Yes: NPRS scale: 4/10  Pain location: Right anterior shoulder but runs down lateral arm to elbow Pain description: Aching Aggravating factors: Use of the arm Relieving factors: Not using the arm is much  PRECAUTIONS: Shoulder  RED FLAGS: None   WEIGHT BEARING RESTRICTIONS: No  FALLS:  Has patient fallen in last 6 months? No  LIVING ENVIRONMENT:   OCCUPATION: Nurse   PLOF: Independent  PATIENT GOALS:  Patient wants to be functional   NEXT MD VISIT:  2 weeks prior    OBJECTIVE:  Note: Objective measures were completed at Evaluation unless otherwise noted.  DIAGNOSTIC FINDINGS:  Nothing post op   PATIENT SURVEYS :  FOTO    COGNITION: Overall cognitive status: Within functional limits for tasks assessed     SENSATION: Denies parathesias   POSTURE: Good   UPPER EXTREMITY ROM:   Passive ROM Right eval Left eval Left 1/21  Shoulder flexion  85 106  Shoulder extension     Shoulder abduction     Shoulder adduction     Shoulder internal rotation  Can sit on body comfortably    Shoulder external rotation  20 26  Elbow flexion     Elbow extension     Wrist flexion     Wrist extension     Wrist ulnar deviation     Wrist radial deviation     Wrist pronation     Wrist supination     (Blank rows = not tested)  UPPER EXTREMITY MMT:  MMT Right eval Left eval  Shoulder flexion    Shoulder extension    Shoulder abduction    Shoulder adduction    Shoulder internal rotation    Shoulder external rotation    Middle trapezius    Lower trapezius    Elbow flexion    Elbow extension    Wrist flexion    Wrist extension    Wrist ulnar deviation    Wrist radial deviation    Wrist pronation    Wrist supination    Grip strength (lbs)    (Blank rows = not tested) Not tested 2nd to recent surgery       PALPATION:  No unexpected TTP  TREATMENT DATE:  04/04/2023: PROM L shoulder STM and TPR to UT and periscapular mm.  Static cupping to lats, teres, UT, and infraspinatus.  Supine active shoulder flexion 2x10 Sidelying shoulder abduction 2x10 SL ER 2x10  03/30/2023: PROM L shoulder Discussed posture during work and daily tasks with tactile cues provided. STM to UT Trigger point release to rhomboids major/ minor with movement.   2/5: PROM L shoulder Supine rhythmic stabilization Supine active flexion x10 Sidelying abduction (with minA) STM to L UT, LS, periscapular,  lats, teres minor.  UT stretch Posterior shoulder rolls seated    MANUA: STM periscapular, upper trap Sidelying resisted scap pro/retract, elevate/depress Seated pelvic tilt+retraction, added cervical rotatoin Table slide flexion with scap retraction upon return Finger ladder   1/31: MANUA: STM periscapular, upper trap Sidelying resisted scap pro/retract, elevate/depress Seated pelvic tilt+retraction, added cervical rotatoin Table slide flexion with scap retraction upon return Finger ladder  1/28 Manual: PROM into all planes per protocl; trigger point release to pec and anterior shoulder.   SHoulder press x15 1 lb 2x15 2lbs  Shoulder flexion 2x10 1 lb   Supine ER 2x10   Fingler ladder 2x10      01/25: Reviewed response to prior Rx, HEP compliance, and pain levels  STM to L UT, sub scap, lats, pec and biceps.   Cervical stretches  Pt received L shoulder PROM in flexion, abd, and ER per pt and tissue tolerance w/n protocol ranges.   01/21: Reviewed response to prior Rx, HEP compliance, and pain levels  Pt received L shoulder PROM in flexion, abd, and ER per pt and tissue tolerance w/n protocol ranges  Supine flexion AAROM with hands clasped w/n protocol  range x10 reps Supine wand ER 2x10 w/n protocol range Seated pulleys in flexion 2x10 w/n protocol range Standing abd isometrics with 5 sec hold x 10 reps Pendulums cw and ccw  See below for pt education   PATIENT EDUCATION: Education details:  HEP, exercise form, rationale of interventions, protocol restrictions and limitations, protocol ranges, and ROM findings Person educated: Patient Education method: Explanation, Demonstration, Tactile cues, Verbal cues Education comprehension: verbalized understanding, returned demonstration, verbal cues required, tactile cues required, and needs further education  HOME EXERCISE PROGRAM: Access Code: [email or web address] URL: [email or web address]   ASSESSMENT:  CLINICAL IMPRESSION: Continued with manual interventions to address significant soft tissue restrictions in shoulder girdle. Trialled static cupping over scapular region and UT today. Pt reported improvement in pain and tightness following this. Good tolerance for AROM. Pt is progressing excellently.    OBJECTIVE IMPAIRMENTS:  decreased ROM, decreased strength, impaired UE functional use, and pain.   ACTIVITY LIMITATIONS: carrying, lifting, sleeping, bathing, toileting, dressing, self feeding, reach over head, and hygiene/grooming  PARTICIPATION LIMITATIONS: meal prep, cleaning, laundry, driving, shopping, community activity, occupation, and yard work  PERSONAL FACTORS: 1-2 comorbidities: previous replacement on the same shoulder osteoporosis; multi joint OA   are also affecting patient's functional outcome.   REHAB POTENTIAL: Good  CLINICAL DECISION MAKING: Stable/uncomplicated  EVALUATION COMPLEXITY: Low  GOALS: Goals reviewed with patient? Yes  SHORT TERM GOALS: Target date: 04/16/2023    Patient will increase passive flexion to 115 degrees  Baseline: Goal status: INITIAL  2.  Patient will increase left shoulder  ER to 30 degrees  Baseline:  Goal status:  INITIAL  3.  Patient will use her left arm for ADL's below 90 degrees with > 2/10 pain  Baseline:  Goal status: INITIAL   LONG TERM GOALS: Target date: 05/28/2023  The patient will reach overhead with left arm without pain in order to perform ADL's  Baseline:  Goal status: INITIAL  2.  Patient will each behind her head without pain in order to per from her ADL's  Baseline:  Goal status: INITIAL  3.  Patient will reach behind her back to L3 without pain in order to perform ADL's  Baseline:  Goal status: INITIAL   PLAN: PT FREQUENCY: 2x/week  PT DURATION: 8 weeks  PLANNED INTERVENTIONS: 97110-Therapeutic exercises, 97530- Therapeutic activity, 97112- Neuromuscular re-education, 97535- Self Care, 16109- Manual therapy, 7113- Aquatic Therapy, 97014- Electrical stimulation (unattended), 97035- Ultrasound, Patient/Family education, Taping, Dry Needling, DME instructions, Cryotherapy, and Moist heat   PLAN FOR NEXT SESSION:  Cont per Dr. Dub Mikes Reverse total shoulder protocol ( will be on Dave's desk )   Lenore Manner, PTA 04/04/2023, 5:08 PM

## 2023-04-05 ENCOUNTER — Encounter (HOSPITAL_BASED_OUTPATIENT_CLINIC_OR_DEPARTMENT_OTHER): Payer: 59 | Admitting: Physical Therapy

## 2023-04-05 ENCOUNTER — Ambulatory Visit (HOSPITAL_BASED_OUTPATIENT_CLINIC_OR_DEPARTMENT_OTHER): Payer: Self-pay

## 2023-04-05 ENCOUNTER — Other Ambulatory Visit (HOSPITAL_BASED_OUTPATIENT_CLINIC_OR_DEPARTMENT_OTHER): Payer: Self-pay

## 2023-04-05 ENCOUNTER — Other Ambulatory Visit (HOSPITAL_COMMUNITY): Payer: Self-pay

## 2023-04-06 ENCOUNTER — Ambulatory Visit (HOSPITAL_BASED_OUTPATIENT_CLINIC_OR_DEPARTMENT_OTHER): Payer: 59 | Admitting: Physical Therapy

## 2023-04-06 ENCOUNTER — Encounter (HOSPITAL_BASED_OUTPATIENT_CLINIC_OR_DEPARTMENT_OTHER): Payer: Self-pay | Admitting: Physical Therapy

## 2023-04-06 DIAGNOSIS — M25512 Pain in left shoulder: Secondary | ICD-10-CM

## 2023-04-06 DIAGNOSIS — G4709 Other insomnia: Secondary | ICD-10-CM | POA: Diagnosis not present

## 2023-04-06 DIAGNOSIS — M25612 Stiffness of left shoulder, not elsewhere classified: Secondary | ICD-10-CM

## 2023-04-06 DIAGNOSIS — G894 Chronic pain syndrome: Secondary | ICD-10-CM | POA: Diagnosis not present

## 2023-04-06 DIAGNOSIS — G3184 Mild cognitive impairment, so stated: Secondary | ICD-10-CM | POA: Diagnosis not present

## 2023-04-06 NOTE — Therapy (Signed)
OUTPATIENT PHYSICAL THERAPY UPPER EXTREMITY TREATMENT   Patient Name: Tara [name] MRN: [id number] DOB:Aug 10, 1960, 63 y.o., female Today's Date: 04/06/2023  END OF SESSION:  PT End of Session - 04/06/23 0926     Visit Number 11    Number of Visits 24    Date for PT Re-Evaluation 05/28/23    PT Start Time 0830    PT Stop Time 0913    PT Time Calculation (min) 43 min    Activity Tolerance Patient tolerated treatment well    Behavior During Therapy [facility] for tasks assessed/performed                   Past Medical History:  Diagnosis Date   Anemia    Arthritis    Hyperlipidemia    Insomnia    Mitral valve prolapse    PONV (postoperative nausea and vomiting)    Past Surgical History:  Procedure Laterality Date   AUGMENTATION MAMMAPLASTY Bilateral    Dec 2019   BREAST BIOPSY Right    axillary node benign   KNEE SURGERY  1988   right   SHOULDER ARTHROSCOPY WITH LABRAL REPAIR Right 08/28/2012   Procedure: RIGHT SHOULDER ARTHROSCOPY WITH LABRAL DEBRIDEMENT, ASPIRATION AND DECOMPRESSION OF PARALABRAL CYST;  Surgeon: Senaida Lange, MD;  Location: [facility];  Service: Orthopedics;  Laterality: Right;   TOTAL SHOULDER ARTHROPLASTY Right 03/24/2020   Procedure: RIGHT REVERSE SHOULDER ARTHROPLASTY;  Surgeon: Francena Hanly, MD;  Location: [facility];  Service: Orthopedics;  Laterality: Right;    TOTAL SHOULDER ARTHROPLASTY Left 02/23/2021   Procedure: TOTAL SHOULDER ARTHROPLASTY;  Surgeon: Francena Hanly, MD;  Location: [facility];  Service: Orthopedics;  Laterality: Left;   TOTAL SHOULDER REVISION Left 02/07/2023   Procedure: Excision Left shoulder nodule; Conversion of left anatomic shoulder arthroplasty to reverse shoulder arthroplasty;  Surgeon: Francena Hanly, MD;  Location: [facility];  Service: Orthopedics;  Laterality: Left;   Patient Active Problem List   Diagnosis Date Noted   Upper respiratory infection 03/01/2023   OA (osteoarthritis) 03/01/2023   Hyponatremia 03/01/2023    Hypercalciuria 02/02/2023   Neurocognitive deficits 07/07/2021   DDD (degenerative disc disease), cervical 04/13/2021   Osteoporosis without current pathological fracture 10/28/2020   Nonallopathic lesion of sacral region 02/26/2020   Hot flashes 02/23/2019   Other insomnia 02/23/2019   Primary osteoarthritis involving multiple joints 02/23/2019   Cystic acne vulgaris 02/23/2019   Bunion of right foot 02/23/2019   Varicose 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 02/23/2019   Multiple atypical nevi 02/23/2019   Polyarthralgia 08/25/2014   Mitral valve prolapse 03/03/2009    PCP: Carilyn Goodpasture [name]   REFERRING PROVIDER: Kaylyn Layer Supple  REFERRING DIAG:  Diagnosis  [phone] (ICD-10-CM) - Presence of left artificial shoulder joint    THERAPY DIAG:  Acute pain of left shoulder  Stiffness of left shoulder, not elsewhere classified  Rationale for Evaluation and Treatment: Rehabilitation  ONSET DATE:  DOS: 02/07/2023  SUBJECTIVE:  SUBJECTIVE STATEMENT: I continue to have a pull in the underside of my arm and in my shoulder blade. I am working really hard on my posture.    From Initial Evaluation: Patient had a left standard total shoulder replacement last year.  She did well for about 6 months then began having progressive pain in the shoulder.  She had to have a shoulder revision with a reverse total shoulder.  At this time her pain is well-controlled.  She is also had a right shoulder replaced.  When she had that replaced she had significant pain.  She reports this 1 is better.  She is wearing her sling outside the house and when sleeping.  She is otherwise has been wearing her sling.  She has increased pain when she moves her arm.  She has been compliant with her HEP so far. Hand dominance:  Right  PERTINENT HISTORY: Multijoint arthritis including knees and hips.  Osteoporosis, right shoulder replacement, mitral valve prolapse  PAIN:  Are you having pain? Yes: NPRS scale: 4/10  Pain location: Right anterior shoulder but runs down lateral arm to elbow Pain description: Aching Aggravating factors: Use of the arm Relieving factors: Not using the arm is much  PRECAUTIONS: Shoulder  RED FLAGS: None   WEIGHT BEARING RESTRICTIONS: No  FALLS:  Has patient fallen in last 6 months? No  LIVING ENVIRONMENT:   OCCUPATION: Nurse   PLOF: Independent  PATIENT GOALS:  Patient wants to be functional   NEXT MD VISIT:  2 weeks prior    OBJECTIVE:  Note: Objective measures were completed at Evaluation unless otherwise noted.  DIAGNOSTIC FINDINGS:  Nothing post op   PATIENT SURVEYS :  FOTO    COGNITION: Overall cognitive status: Within functional limits for tasks assessed     SENSATION: Denies parathesias   POSTURE: Good   UPPER EXTREMITY ROM:   Passive ROM Right eval Left eval Left 1/21  Shoulder flexion  85 106  Shoulder extension     Shoulder abduction     Shoulder adduction     Shoulder internal rotation  Can sit on body comfortably    Shoulder external rotation  20 26  Elbow flexion     Elbow extension     Wrist flexion     Wrist extension     Wrist ulnar deviation     Wrist radial deviation     Wrist pronation     Wrist supination     (Blank rows = not tested)  UPPER EXTREMITY MMT:  MMT Right eval Left eval  Shoulder flexion    Shoulder extension    Shoulder abduction    Shoulder adduction    Shoulder internal rotation    Shoulder external rotation    Middle trapezius    Lower trapezius    Elbow flexion    Elbow extension    Wrist flexion    Wrist extension    Wrist ulnar deviation    Wrist radial deviation    Wrist pronation    Wrist supination    Grip strength (lbs)    (Blank rows = not tested) Not tested 2nd to recent  surgery      PALPATION:  No unexpected TTP  TREATMENT DATE: 04/06/2023: PROM into flexion with STM to lats. STM and TPR to UT and periscapular mm.  Trigger Point Dry Needling  Initial Treatment: Pt instructed on Dry Needling rational, procedures, and possible side effects. Pt instructed to expect mild to moderate muscle soreness later in the day and/or into the next day.  Pt instructed to continue prescribed HEP. Because Dry Needling was performed over or adjacent to a lung field, pt was educated on S/S of pneumothorax and to seek immediate medical attention should they occur.  Patient was educated on signs and symptoms of infection and other risk factors and advised to seek medical attention should they occur.  Patient verbalized understanding of these instructions and education.   Patient Verbal Consent Given: Yes Education Handout Provided: Previously Provided Muscles Treated: Subscap, bilat UT Electrical Stimulation Performed: No Treatment Response/Outcome: Multiple trigger point response's. No adverse effects, no noted bleeding or bruising present.  Shoulder abduction and flexion in front of mirror for visual feedback  Walking on treadmill to assess natural arm swing and posture.  Sidelying shoulder abduction 2x10  04/04/2023: PROM L shoulder STM and TPR to UT and periscapular mm.  Static cupping to lats, teres, UT, and infraspinatus.  Supine active shoulder flexion 2x10 Sidelying shoulder abduction 2x10 SL ER 2x10  03/30/2023: PROM L shoulder Discussed posture during work and daily tasks with tactile cues provided. STM to UT Trigger point release to rhomboids major/ minor with movement.   2/5: PROM L shoulder Supine rhythmic stabilization Supine active flexion x10 Sidelying abduction (with minA) STM to L UT, LS, periscapular,  lats, teres  minor.  UT stretch Posterior shoulder rolls seated    MANUA: STM periscapular, upper trap Sidelying resisted scap pro/retract, elevate/depress Seated pelvic tilt+retraction, added cervical rotatoin Table slide flexion with scap retraction upon return Finger ladder   1/31: MANUA: STM periscapular, upper trap Sidelying resisted scap pro/retract, elevate/depress Seated pelvic tilt+retraction, added cervical rotatoin Table slide flexion with scap retraction upon return Finger ladder  1/28 Manual: PROM into all planes per protocl; trigger point release to pec and anterior shoulder.   SHoulder press x15 1 lb 2x15 2lbs  Shoulder flexion 2x10 1 lb   Supine ER 2x10   Fingler ladder 2x10      01/25: Reviewed response to prior Rx, HEP compliance, and pain levels  STM to L UT, sub scap, lats, pec and biceps.   Cervical stretches  Pt received L shoulder PROM in flexion, abd, and ER per pt and tissue tolerance w/n protocol ranges.   01/21: Reviewed response to prior Rx, HEP compliance, and pain levels  Pt received L shoulder PROM in flexion, abd, and ER per pt and tissue tolerance w/n protocol ranges  Supine flexion AAROM with hands clasped w/n protocol range x10 reps Supine wand ER 2x10 w/n protocol range Seated pulleys in flexion 2x10 w/n protocol range Standing abd isometrics with 5 sec hold x 10 reps Pendulums cw and ccw  See below for pt education   PATIENT EDUCATION: Education details:  HEP, exercise form, rationale of interventions, protocol restrictions and limitations, protocol ranges, and ROM findings Person educated: Patient Education method: Explanation, Demonstration, Tactile cues, Verbal cues Education comprehension: verbalized understanding, returned demonstration, verbal cues required, tactile cues required, and needs further education  HOME EXERCISE PROGRAM: Access Code: [email or web address] URL: [email or web address]   ASSESSMENT:  CLINICAL  IMPRESSION: Continued with manual interventions to address significant soft tissue restrictions in shoulder girdle. TPDN per request and with approval from MD. Pt  has multiple trigger points with good response to needling, no adverse effects noted. Preformed AROM into flexion and abduction with visual cues to ensure proper form. Pt is hyper aware of posture. She requested evaluation of walking on a treadmill due to pt wanting to incorporate walking and light jogging into daily routine. Pt reported improvement in pain and tightness following this. Good tolerance for AROM. Pt is progressing excellently.    OBJECTIVE IMPAIRMENTS:  decreased ROM, decreased strength, impaired UE functional use, and pain.   ACTIVITY LIMITATIONS: carrying, lifting, sleeping, bathing, toileting, dressing, self feeding, reach over head, and hygiene/grooming  PARTICIPATION LIMITATIONS: meal prep, cleaning, laundry, driving, shopping, community activity, occupation, and yard work  PERSONAL FACTORS: 1-2 comorbidities: previous replacement on the same shoulder osteoporosis; multi joint OA   are also affecting patient's functional outcome.   REHAB POTENTIAL: Good  CLINICAL DECISION MAKING: Stable/uncomplicated  EVALUATION COMPLEXITY: Low  GOALS: Goals reviewed with patient? Yes  SHORT TERM GOALS: Target date: 04/16/2023    Patient will increase passive flexion to 115 degrees  Baseline: Goal status: INITIAL  2.  Patient will increase left shoulder  ER to 30 degrees  Baseline:  Goal status: INITIAL  3.  Patient will use her left arm for ADL's below 90 degrees with > 2/10 pain  Baseline:  Goal status: INITIAL   LONG TERM GOALS: Target date: 05/28/2023    The patient will reach overhead with left arm without pain in order to perform ADL's  Baseline:  Goal status: INITIAL  2.  Patient will each behind her head without pain in order to per from her ADL's  Baseline:  Goal status: INITIAL  3.  Patient will  reach behind her back to L3 without pain in order to perform ADL's  Baseline:  Goal status: INITIAL   PLAN: PT FREQUENCY: 2x/week  PT DURATION: 8 weeks  PLANNED INTERVENTIONS: 97110-Therapeutic exercises, 97530- Therapeutic activity, 97112- Neuromuscular re-education, 97535- Self Care, 40981- Manual therapy, 7113- Aquatic Therapy, 97014- Electrical stimulation (unattended), 97035- Ultrasound, Patient/Family education, Taping, Dry Needling, DME instructions, Cryotherapy, and Moist heat   PLAN FOR NEXT SESSION:  Cont per Dr. Dub Mikes Reverse total shoulder protocol ( will be on Dave's desk )   Champ Mungo, PT 04/06/2023, 9:27 AM

## 2023-04-06 NOTE — Patient Instructions (Signed)

## 2023-04-08 ENCOUNTER — Other Ambulatory Visit (HOSPITAL_COMMUNITY): Payer: Self-pay

## 2023-04-08 MED ORDER — AMOXICILLIN 500 MG PO TABS
2000.0000 mg | ORAL_TABLET | ORAL | 0 refills | Status: AC
  Filled 2023-04-08 (×2): qty 4, 1d supply, fill #0

## 2023-04-09 ENCOUNTER — Encounter (HOSPITAL_BASED_OUTPATIENT_CLINIC_OR_DEPARTMENT_OTHER): Payer: 59 | Admitting: Physical Therapy

## 2023-04-12 ENCOUNTER — Other Ambulatory Visit (HOSPITAL_COMMUNITY): Payer: Self-pay

## 2023-04-12 ENCOUNTER — Encounter (HOSPITAL_BASED_OUTPATIENT_CLINIC_OR_DEPARTMENT_OTHER): Payer: Self-pay

## 2023-04-12 ENCOUNTER — Ambulatory Visit (HOSPITAL_BASED_OUTPATIENT_CLINIC_OR_DEPARTMENT_OTHER): Payer: 59

## 2023-04-12 DIAGNOSIS — G3184 Mild cognitive impairment, so stated: Secondary | ICD-10-CM | POA: Diagnosis not present

## 2023-04-12 DIAGNOSIS — M25512 Pain in left shoulder: Secondary | ICD-10-CM

## 2023-04-12 DIAGNOSIS — M25612 Stiffness of left shoulder, not elsewhere classified: Secondary | ICD-10-CM

## 2023-04-12 DIAGNOSIS — G894 Chronic pain syndrome: Secondary | ICD-10-CM | POA: Diagnosis not present

## 2023-04-12 DIAGNOSIS — G4709 Other insomnia: Secondary | ICD-10-CM | POA: Diagnosis not present

## 2023-04-12 NOTE — Therapy (Signed)
OUTPATIENT PHYSICAL THERAPY UPPER EXTREMITY TREATMENT   Patient Name: Tara [name] MRN: [id number] DOB:10/27/1960, 63 y.o., female Today's Date: 04/12/2023  END OF SESSION:  PT End of Session - 04/12/23 1135     Visit Number 12    Number of Visits 24    Date for PT Re-Evaluation 05/28/23    PT Start Time 1101    PT Stop Time 1145    PT Time Calculation (min) 44 min    Activity Tolerance Patient tolerated treatment well    Behavior During Therapy [facility] for tasks assessed/performed                    Past Medical History:  Diagnosis Date   Anemia    Arthritis    Hyperlipidemia    Insomnia    Mitral valve prolapse    PONV (postoperative nausea and vomiting)    Past Surgical History:  Procedure Laterality Date   AUGMENTATION MAMMAPLASTY Bilateral    Dec 2019   BREAST BIOPSY Right    axillary node benign   KNEE SURGERY  1988   right   SHOULDER ARTHROSCOPY WITH LABRAL REPAIR Right 08/28/2012   Procedure: RIGHT SHOULDER ARTHROSCOPY WITH LABRAL DEBRIDEMENT, ASPIRATION AND DECOMPRESSION OF PARALABRAL CYST;  Surgeon: Senaida Lange, MD;  Location: [facility];  Service: Orthopedics;  Laterality: Right;   TOTAL SHOULDER ARTHROPLASTY Right 03/24/2020   Procedure: RIGHT REVERSE SHOULDER ARTHROPLASTY;  Surgeon: Francena Hanly, MD;  Location: [facility];  Service: Orthopedics;  Laterality: Right;    TOTAL SHOULDER ARTHROPLASTY Left 02/23/2021   Procedure: TOTAL SHOULDER ARTHROPLASTY;  Surgeon: Francena Hanly, MD;  Location: [facility];  Service: Orthopedics;  Laterality: Left;   TOTAL SHOULDER REVISION Left 02/07/2023   Procedure: Excision Left shoulder nodule; Conversion of left anatomic shoulder arthroplasty to reverse shoulder arthroplasty;  Surgeon: Francena Hanly, MD;  Location: [facility];  Service: Orthopedics;  Laterality: Left;   Patient Active Problem List   Diagnosis Date Noted   Upper respiratory infection 03/01/2023   OA (osteoarthritis) 03/01/2023   Hyponatremia  03/01/2023   Hypercalciuria 02/02/2023   Neurocognitive deficits 07/07/2021   DDD (degenerative disc disease), cervical 04/13/2021   Osteoporosis without current pathological fracture 10/28/2020   Nonallopathic lesion of sacral region 02/26/2020   Hot flashes 02/23/2019   Other insomnia 02/23/2019   Primary osteoarthritis involving multiple joints 02/23/2019   Cystic acne vulgaris 02/23/2019   Bunion of right foot 02/23/2019   Varicose 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 02/23/2019   Multiple atypical nevi 02/23/2019   Polyarthralgia 08/25/2014   Mitral valve prolapse 03/03/2009    PCP: Carilyn Goodpasture [name]   REFERRING PROVIDER: Kaylyn Layer Supple  REFERRING DIAG:  Diagnosis  [phone] (ICD-10-CM) - Presence of left artificial shoulder joint    THERAPY DIAG:  Acute pain of left shoulder  Stiffness of left shoulder, not elsewhere classified  Rationale for Evaluation and Treatment: Rehabilitation  ONSET DATE:  DOS: 02/07/2023  SUBJECTIVE:  SUBJECTIVE STATEMENT: Pt reports temporary improvement from cupping and DN. Ongoing pain and tightness in posteiror L shoulder. Pt questions when she can return to working out.    From Initial Evaluation: Patient had a left standard total shoulder replacement last year.  She did well for about 6 months then began having progressive pain in the shoulder.  She had to have a shoulder revision with a reverse total shoulder.  At this time her pain is well-controlled.  She is also had a right shoulder replaced.  When she had that replaced she had significant pain.  She reports this 1 is better.  She is wearing her sling outside the house and when sleeping.  She is otherwise has been wearing her sling.  She has increased pain when she moves her arm.  She has been  compliant with her HEP so far. Hand dominance: Right  PERTINENT HISTORY: Multijoint arthritis including knees and hips.  Osteoporosis, right shoulder replacement, mitral valve prolapse  PAIN:  Are you having pain? Yes: NPRS scale: 4/10  Pain location: Right anterior shoulder but runs down lateral arm to elbow Pain description: Aching Aggravating factors: Use of the arm Relieving factors: Not using the arm is much  PRECAUTIONS: Shoulder  RED FLAGS: None   WEIGHT BEARING RESTRICTIONS: No  FALLS:  Has patient fallen in last 6 months? No  LIVING ENVIRONMENT:   OCCUPATION: Nurse   PLOF: Independent  PATIENT GOALS:  Patient wants to be functional   NEXT MD VISIT:  2 weeks prior    OBJECTIVE:  Note: Objective measures were completed at Evaluation unless otherwise noted.  DIAGNOSTIC FINDINGS:  Nothing post op   PATIENT SURVEYS :  FOTO    COGNITION: Overall cognitive status: Within functional limits for tasks assessed     SENSATION: Denies parathesias   POSTURE: Good   UPPER EXTREMITY ROM:   Passive ROM Right eval Left eval Left 1/21  Shoulder flexion  85 106  Shoulder extension     Shoulder abduction     Shoulder adduction     Shoulder internal rotation  Can sit on body comfortably    Shoulder external rotation  20 26  Elbow flexion     Elbow extension     Wrist flexion     Wrist extension     Wrist ulnar deviation     Wrist radial deviation     Wrist pronation     Wrist supination     (Blank rows = not tested)  UPPER EXTREMITY MMT:  MMT Right eval Left eval  Shoulder flexion    Shoulder extension    Shoulder abduction    Shoulder adduction    Shoulder internal rotation    Shoulder external rotation    Middle trapezius    Lower trapezius    Elbow flexion    Elbow extension    Wrist flexion    Wrist extension    Wrist ulnar deviation    Wrist radial deviation    Wrist pronation    Wrist supination    Grip strength (lbs)     (Blank rows = not tested) Not tested 2nd to recent surgery      PALPATION:  No unexpected TTP  TREATMENT DATE:  04/12/2023: PROM L shoulder STM and TPR to UT, lats and periscapular mm.  Supine active shoulder flexion x10 0#, x10 1# Sidelying shoulder abduction x5 0# 2x5 1# Quadruped weight shifting Child's pose for flexion stretch Quadruped cat stretch Supine ABC x1 HEP update   04/06/2023: PROM into flexion with STM to lats. STM and TPR to UT and periscapular mm.  Trigger Point Dry Needling  Initial Treatment: Pt instructed on Dry Needling rational, procedures, and possible side effects. Pt instructed to expect mild to moderate muscle soreness later in the day and/or into the next day.  Pt instructed to continue prescribed HEP. Because Dry Needling was performed over or adjacent to a lung field, pt was educated on S/S of pneumothorax and to seek immediate medical attention should they occur.  Patient was educated on signs and symptoms of infection and other risk factors and advised to seek medical attention should they occur.  Patient verbalized understanding of these instructions and education.   Patient Verbal Consent Given: Yes Education Handout Provided: Previously Provided Muscles Treated: Subscap, bilat UT Electrical Stimulation Performed: No Treatment Response/Outcome: Multiple trigger point response's. No adverse effects, no noted bleeding or bruising present.  Shoulder abduction and flexion in front of mirror for visual feedback  Walking on treadmill to assess natural arm swing and posture.  Sidelying shoulder abduction 2x10  04/04/2023: PROM L shoulder STM and TPR to UT and periscapular mm.  Static cupping to lats, teres, UT, and infraspinatus.  Supine active shoulder flexion 2x10 Sidelying shoulder abduction 2x10 SL ER  2x10  03/30/2023: PROM L shoulder Discussed posture during work and daily tasks with tactile cues provided. STM to UT Trigger point release to rhomboids major/ minor with movement.   2/5: PROM L shoulder Supine rhythmic stabilization Supine active flexion x10 Sidelying abduction (with minA) STM to L UT, LS, periscapular,  lats, teres minor.  UT stretch Posterior shoulder rolls seated    MANUA: STM periscapular, upper trap Sidelying resisted scap pro/retract, elevate/depress Seated pelvic tilt+retraction, added cervical rotatoin Table slide flexion with scap retraction upon return Finger ladder   1/31: MANUA: STM periscapular, upper trap Sidelying resisted scap pro/retract, elevate/depress Seated pelvic tilt+retraction, added cervical rotatoin Table slide flexion with scap retraction upon return Finger ladder  1/28 Manual: PROM into all planes per protocl; trigger point release to pec and anterior shoulder.   SHoulder press x15 1 lb 2x15 2lbs  Shoulder flexion 2x10 1 lb   Supine ER 2x10   Fingler ladder 2x10      01/25: Reviewed response to prior Rx, HEP compliance, and pain levels  STM to L UT, sub scap, lats, pec and biceps.   Cervical stretches  Pt received L shoulder PROM in flexion, abd, and ER per pt and tissue tolerance w/n protocol ranges.   01/21: Reviewed response to prior Rx, HEP compliance, and pain levels  Pt received L shoulder PROM in flexion, abd, and ER per pt and tissue tolerance w/n protocol ranges  Supine flexion AAROM with hands clasped w/n protocol range x10 reps Supine wand ER 2x10 w/n protocol range Seated pulleys in flexion 2x10 w/n protocol range Standing abd isometrics with 5 sec hold x 10 reps Pendulums cw and ccw  See below for pt education   PATIENT EDUCATION: Education details:  HEP, exercise form, rationale of interventions, protocol restrictions and limitations, protocol ranges, and ROM findings Person educated:  Patient Education method: Explanation, Demonstration, Tactile cues, Verbal cues Education comprehension: verbalized understanding, returned  demonstration, verbal cues required, tactile cues required, and needs further education  HOME EXERCISE PROGRAM: Access Code: [email or web address] URL: [email or web address]   ASSESSMENT:  CLINICAL IMPRESSION: Pt remains tight and tender within scapular region, especially lat/teres minor. Overall there is improved quality of soft tissue palpated here compared to previous sessions. Improving ROM, though some tightness at very end ranges. She was able to tolerate addition of 1# weight with plinth AROM today, though challenging. She denied pain with exercises. Updated HEP with good understanding. Will continue to progress as tolerated with scapular stabilization and active strength.    OBJECTIVE IMPAIRMENTS:  decreased ROM, decreased strength, impaired UE functional use, and pain.   ACTIVITY LIMITATIONS: carrying, lifting, sleeping, bathing, toileting, dressing, self feeding, reach over head, and hygiene/grooming  PARTICIPATION LIMITATIONS: meal prep, cleaning, laundry, driving, shopping, community activity, occupation, and yard work  PERSONAL FACTORS: 1-2 comorbidities: previous replacement on the same shoulder osteoporosis; multi joint OA   are also affecting patient's functional outcome.   REHAB POTENTIAL: Good  CLINICAL DECISION MAKING: Stable/uncomplicated  EVALUATION COMPLEXITY: Low  GOALS: Goals reviewed with patient? Yes  SHORT TERM GOALS: Target date: 04/16/2023    Patient will increase passive flexion to 115 degrees  Baseline: Goal status: INITIAL  2.  Patient will increase left shoulder  ER to 30 degrees  Baseline:  Goal status: INITIAL  3.  Patient will use her left arm for ADL's below 90 degrees with > 2/10 pain  Baseline:  Goal status: INITIAL   LONG TERM GOALS: Target date: 05/28/2023    The patient will reach  overhead with left arm without pain in order to perform ADL's  Baseline:  Goal status: INITIAL  2.  Patient will each behind her head without pain in order to per from her ADL's  Baseline:  Goal status: INITIAL  3.  Patient will reach behind her back to L3 without pain in order to perform ADL's  Baseline:  Goal status: INITIAL   PLAN: PT FREQUENCY: 2x/week  PT DURATION: 8 weeks  PLANNED INTERVENTIONS: 97110-Therapeutic exercises, 97530- Therapeutic activity, 97112- Neuromuscular re-education, 97535- Self Care, 47829- Manual therapy, 7113- Aquatic Therapy, 97014- Electrical stimulation (unattended), 97035- Ultrasound, Patient/Family education, Taping, Dry Needling, DME instructions, Cryotherapy, and Moist heat   PLAN FOR NEXT SESSION:  Cont per Dr. Dub Mikes Reverse total shoulder protocol ( will be on Dave's desk )   Lenore Manner, PTA 04/12/2023, 3:47 PM

## 2023-04-16 ENCOUNTER — Encounter (HOSPITAL_BASED_OUTPATIENT_CLINIC_OR_DEPARTMENT_OTHER): Payer: Self-pay

## 2023-04-16 ENCOUNTER — Ambulatory Visit (HOSPITAL_BASED_OUTPATIENT_CLINIC_OR_DEPARTMENT_OTHER): Payer: 59

## 2023-04-16 DIAGNOSIS — G3184 Mild cognitive impairment, so stated: Secondary | ICD-10-CM | POA: Diagnosis not present

## 2023-04-16 DIAGNOSIS — M25612 Stiffness of left shoulder, not elsewhere classified: Secondary | ICD-10-CM

## 2023-04-16 DIAGNOSIS — M25512 Pain in left shoulder: Secondary | ICD-10-CM

## 2023-04-16 DIAGNOSIS — G4709 Other insomnia: Secondary | ICD-10-CM | POA: Diagnosis not present

## 2023-04-16 DIAGNOSIS — G894 Chronic pain syndrome: Secondary | ICD-10-CM | POA: Diagnosis not present

## 2023-04-16 NOTE — Therapy (Signed)
 OUTPATIENT PHYSICAL THERAPY UPPER EXTREMITY TREATMENT   Patient Name: Tara [name] MRN: [id number] DOB:08-05-1960, 63 y.o., female Today's Date: 04/16/2023  END OF SESSION:  PT End of Session - 04/16/23 1036     Visit Number 13    Number of Visits 24    Date for PT Re-Evaluation 05/28/23    PT Start Time 0935    PT Stop Time 1022    PT Time Calculation (min) 47 min    Activity Tolerance Patient tolerated treatment well    Behavior During Therapy [facility] for tasks assessed/performed                     Past Medical History:  Diagnosis Date   Anemia    Arthritis    Hyperlipidemia    Insomnia    Mitral valve prolapse    PONV (postoperative nausea and vomiting)    Past Surgical History:  Procedure Laterality Date   AUGMENTATION MAMMAPLASTY Bilateral    Dec 2019   BREAST BIOPSY Right    axillary node benign   KNEE SURGERY  1988   right   SHOULDER ARTHROSCOPY WITH LABRAL REPAIR Right 08/28/2012   Procedure: RIGHT SHOULDER ARTHROSCOPY WITH LABRAL DEBRIDEMENT, ASPIRATION AND DECOMPRESSION OF PARALABRAL CYST;  Surgeon: Senaida Lange, MD;  Location: [facility];  Service: Orthopedics;  Laterality: Right;   TOTAL SHOULDER ARTHROPLASTY Right 03/24/2020   Procedure: RIGHT REVERSE SHOULDER ARTHROPLASTY;  Surgeon: Francena Hanly, MD;  Location: [facility];  Service: Orthopedics;  Laterality: Right;    TOTAL SHOULDER ARTHROPLASTY Left 02/23/2021   Procedure: TOTAL SHOULDER ARTHROPLASTY;  Surgeon: Francena Hanly, MD;  Location: [facility];  Service: Orthopedics;  Laterality: Left;   TOTAL SHOULDER REVISION Left 02/07/2023   Procedure: Excision Left shoulder nodule; Conversion of left anatomic shoulder arthroplasty to reverse shoulder arthroplasty;  Surgeon: Francena Hanly, MD;  Location: [facility];  Service: Orthopedics;  Laterality: Left;   Patient Active Problem List   Diagnosis Date Noted   Upper respiratory infection 03/01/2023   OA (osteoarthritis) 03/01/2023   Hyponatremia  03/01/2023   Hypercalciuria 02/02/2023   Neurocognitive deficits 07/07/2021   DDD (degenerative disc disease), cervical 04/13/2021   Osteoporosis without current pathological fracture 10/28/2020   Nonallopathic lesion of sacral region 02/26/2020   Hot flashes 02/23/2019   Other insomnia 02/23/2019   Primary osteoarthritis involving multiple joints 02/23/2019   Cystic acne vulgaris 02/23/2019   Bunion of right foot 02/23/2019   Varicose 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 02/23/2019   Multiple atypical nevi 02/23/2019   Polyarthralgia 08/25/2014   Mitral valve prolapse 03/03/2009    PCP: Carilyn Goodpasture [name]   REFERRING PROVIDER: Kaylyn Layer Supple  REFERRING DIAG:  Diagnosis  [phone] (ICD-10-CM) - Presence of left artificial shoulder joint    THERAPY DIAG:  Acute pain of left shoulder  Stiffness of left shoulder, not elsewhere classified  Rationale for Evaluation and Treatment: Rehabilitation  ONSET DATE:  DOS: 02/07/2023  SUBJECTIVE:  SUBJECTIVE STATEMENT: Pt reports temporary improvement from cupping and DN. Ongoing pain and tightness in posteiror L shoulder. Pt questions when she can return to working out.    From Initial Evaluation: Patient had a left standard total shoulder replacement last year.  She did well for about 6 months then began having progressive pain in the shoulder.  She had to have a shoulder revision with a reverse total shoulder.  At this time her pain is well-controlled.  She is also had a right shoulder replaced.  When she had that replaced she had significant pain.  She reports this 1 is better.  She is wearing her sling outside the house and when sleeping.  She is otherwise has been wearing her sling.  She has increased pain when she moves her arm.  She has been  compliant with her HEP so far. Hand dominance: Right  PERTINENT HISTORY: Multijoint arthritis including knees and hips.  Osteoporosis, right shoulder replacement, mitral valve prolapse  PAIN:  Are you having pain? Yes: NPRS scale: 4/10  Pain location: Right anterior shoulder but runs down lateral arm to elbow Pain description: Aching Aggravating factors: Use of the arm Relieving factors: Not using the arm is much  PRECAUTIONS: Shoulder  RED FLAGS: None   WEIGHT BEARING RESTRICTIONS: No  FALLS:  Has patient fallen in last 6 months? No  LIVING ENVIRONMENT:   OCCUPATION: Nurse   PLOF: Independent  PATIENT GOALS:  Patient wants to be functional   NEXT MD VISIT:  2 weeks prior    OBJECTIVE:  Note: Objective measures were completed at Evaluation unless otherwise noted.  DIAGNOSTIC FINDINGS:  Nothing post op   PATIENT SURVEYS :  FOTO    COGNITION: Overall cognitive status: Within functional limits for tasks assessed     SENSATION: Denies parathesias   POSTURE: Good   UPPER EXTREMITY ROM:   Passive ROM Right eval Left eval Left 1/21  Shoulder flexion  85 106  Shoulder extension     Shoulder abduction     Shoulder adduction     Shoulder internal rotation  Can sit on body comfortably    Shoulder external rotation  20 26  Elbow flexion     Elbow extension     Wrist flexion     Wrist extension     Wrist ulnar deviation     Wrist radial deviation     Wrist pronation     Wrist supination     (Blank rows = not tested)  UPPER EXTREMITY MMT:  MMT Right eval Left eval  Shoulder flexion    Shoulder extension    Shoulder abduction    Shoulder adduction    Shoulder internal rotation    Shoulder external rotation    Middle trapezius    Lower trapezius    Elbow flexion    Elbow extension    Wrist flexion    Wrist extension    Wrist ulnar deviation    Wrist radial deviation    Wrist pronation    Wrist supination    Grip strength (lbs)     (Blank rows = not tested) Not tested 2nd to recent surgery      PALPATION:  No unexpected TTP  TREATMENT DATE:  04/16/2023: PROM L shoulder STM and TPR to UT, lats and periscapular mm.  Scapular mobilizations Supine active shoulder flexion 1# 2x10 Sidelying shoulder abduction 1#x10, 0#x10 Child's pose for flexion stretch Supine ABC x2 with 1#     04/12/2023: PROM L shoulder STM and TPR to UT, lats and periscapular mm.  Supine active shoulder flexion x10 0#, x10 1# Sidelying shoulder abduction x5 0# 2x5 1# Quadruped weight shifting Child's pose for flexion stretch Quadruped cat stretch Supine ABC x1 HEP update   04/06/2023: PROM into flexion with STM to lats. STM and TPR to UT and periscapular mm.  Trigger Point Dry Needling  Initial Treatment: Pt instructed on Dry Needling rational, procedures, and possible side effects. Pt instructed to expect mild to moderate muscle soreness later in the day and/or into the next day.  Pt instructed to continue prescribed HEP. Because Dry Needling was performed over or adjacent to a lung field, pt was educated on S/S of pneumothorax and to seek immediate medical attention should they occur.  Patient was educated on signs and symptoms of infection and other risk factors and advised to seek medical attention should they occur.  Patient verbalized understanding of these instructions and education.   Patient Verbal Consent Given: Yes Education Handout Provided: Previously Provided Muscles Treated: Subscap, bilat UT Electrical Stimulation Performed: No Treatment Response/Outcome: Multiple trigger point response's. No adverse effects, no noted bleeding or bruising present.  Shoulder abduction and flexion in front of mirror for visual feedback  Walking on treadmill to assess natural arm swing and posture.   Sidelying shoulder abduction 2x10    PATIENT EDUCATION: Education details:  HEP, exercise form, rationale of interventions, protocol restrictions and limitations, protocol ranges, and ROM findings Person educated: Patient Education method: Explanation, Demonstration, Tactile cues, Verbal cues Education comprehension: verbalized understanding, returned demonstration, verbal cues required, tactile cues required, and needs further education  HOME EXERCISE PROGRAM: Access Code: [email or web address] URL: [email or web address]   ASSESSMENT:  CLINICAL IMPRESSION: Continued to work on manual interventions due to positive report following previous sessions. She remains tight with adhesions present especially throughout medial border of L scapula. Will benefit from additional DN at future sessions. She demonstrates overall good ROM, though tightness at end ranges in comparison to contralateral UE. Marland Kitchen Challenged by active supine and sidelying ROM with light resistance. Pt will benefit from additional strengthening.    OBJECTIVE IMPAIRMENTS:  decreased ROM, decreased strength, impaired UE functional use, and pain.   ACTIVITY LIMITATIONS: carrying, lifting, sleeping, bathing, toileting, dressing, self feeding, reach over head, and hygiene/grooming  PARTICIPATION LIMITATIONS: meal prep, cleaning, laundry, driving, shopping, community activity, occupation, and yard work  PERSONAL FACTORS: 1-2 comorbidities: previous replacement on the same shoulder osteoporosis; multi joint OA   are also affecting patient's functional outcome.   REHAB POTENTIAL: Good  CLINICAL DECISION MAKING: Stable/uncomplicated  EVALUATION COMPLEXITY: Low  GOALS: Goals reviewed with patient? Yes  SHORT TERM GOALS: Target date: 04/16/2023    Patient will increase passive flexion to 115 degrees  Baseline: Goal status: INITIAL  2.  Patient will increase left shoulder  ER to 30 degrees  Baseline:  Goal status:  INITIAL  3.  Patient will use her left arm for ADL's below 90 degrees with > 2/10 pain  Baseline:  Goal status: INITIAL   LONG TERM GOALS: Target date: 05/28/2023    The patient will reach overhead with left arm without pain in order to perform ADL's  Baseline:  Goal status: INITIAL  2.  Patient will each behind her head without pain in order to per from her ADL's  Baseline:  Goal status: INITIAL  3.  Patient will reach behind her back to L3 without pain in order to perform ADL's  Baseline:  Goal status: INITIAL   PLAN: PT FREQUENCY: 2x/week  PT DURATION: 8 weeks  PLANNED INTERVENTIONS: 97110-Therapeutic exercises, 97530- Therapeutic activity, 97112- Neuromuscular re-education, 97535- Self Care, 65784- Manual therapy, 7113- Aquatic Therapy, 97014- Electrical stimulation (unattended), 97035- Ultrasound, Patient/Family education, Taping, Dry Needling, DME instructions, Cryotherapy, and Moist heat   PLAN FOR NEXT SESSION:  Cont per Dr. Dub Mikes Reverse total shoulder protocol ( will be on Dave's desk )   Lenore Manner, PTA 04/16/2023, 10:37 AM

## 2023-04-18 ENCOUNTER — Encounter (HOSPITAL_BASED_OUTPATIENT_CLINIC_OR_DEPARTMENT_OTHER): Payer: 59 | Admitting: Physical Therapy

## 2023-04-18 NOTE — Therapy (Unsigned)
 OUTPATIENT PHYSICAL THERAPY UPPER EXTREMITY TREATMENT   Patient Name: Tara [name] MRN: [id number] DOB:[birth year], 63 y.o., female Today's Date: 04/19/2023  END OF SESSION:  PT End of Session - 04/19/23 0809     Visit Number 14    Number of Visits 24    Date for PT Re-Evaluation 05/28/23    PT Start Time 0800    PT Stop Time 0840    PT Time Calculation (min) 40 min    Activity Tolerance Patient tolerated treatment well    Behavior During Therapy [facility] for tasks assessed/performed              Past Medical History:  Diagnosis Date   Anemia    Arthritis    Hyperlipidemia    Insomnia    Mitral valve prolapse    PONV (postoperative nausea and vomiting)    Past Surgical History:  Procedure Laterality Date   AUGMENTATION MAMMAPLASTY Bilateral    Dec 2019   BREAST BIOPSY Right    axillary node benign   KNEE SURGERY  1988   right   SHOULDER ARTHROSCOPY WITH LABRAL REPAIR Right 08/28/2012   Procedure: RIGHT SHOULDER ARTHROSCOPY WITH LABRAL DEBRIDEMENT, ASPIRATION AND DECOMPRESSION OF PARALABRAL CYST;  Surgeon: Senaida Lange, MD;  Location: [facility] OR;  Service: Orthopedics;  Laterality: Right;   TOTAL SHOULDER ARTHROPLASTY Right 03/24/2020   Procedure: RIGHT REVERSE SHOULDER ARTHROPLASTY;  Surgeon: Francena Hanly, MD;  Location: [facility];  Service: Orthopedics;  Laterality: Right;    TOTAL SHOULDER ARTHROPLASTY Left 02/23/2021   Procedure: TOTAL SHOULDER ARTHROPLASTY;  Surgeon: Francena Hanly, MD;  Location: [facility];  Service: Orthopedics;  Laterality: Left;   TOTAL SHOULDER REVISION Left 02/07/2023   Procedure: Excision Left shoulder nodule; Conversion of left anatomic shoulder arthroplasty to reverse shoulder arthroplasty;  Surgeon: Francena Hanly, MD;  Location: [facility];  Service: Orthopedics;  Laterality: Left;   Patient Active Problem List   Diagnosis Date Noted   Upper respiratory infection 03/01/2023   OA (osteoarthritis) 03/01/2023   Hyponatremia 03/01/2023    Hypercalciuria 02/02/2023   Neurocognitive deficits 07/07/2021   DDD (degenerative disc disease), cervical 04/13/2021   Osteoporosis without current pathological fracture 10/28/2020   Nonallopathic lesion of sacral region 02/26/2020   Hot flashes 02/23/2019   Other insomnia 02/23/2019   Primary osteoarthritis involving multiple joints 02/23/2019   Cystic acne vulgaris 02/23/2019   Bunion of right foot 02/23/2019   Varicose 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 02/23/2019   Multiple atypical nevi 02/23/2019   Polyarthralgia 08/25/2014   Mitral valve prolapse 03/03/2009    PCP: Carilyn Goodpasture [name]   REFERRING PROVIDER: Kaylyn Layer Supple  REFERRING DIAG:  Diagnosis  [phone] (ICD-10-CM) - Presence of left artificial shoulder joint    THERAPY DIAG:  Acute pain of left shoulder  Stiffness of left shoulder, not elsewhere classified  Rationale for Evaluation and Treatment: Rehabilitation  ONSET DATE:  DOS: 02/07/2023  SUBJECTIVE:  SUBJECTIVE STATEMENT: Pt reports temporary improvement from cupping and DN. Ongoing pain and tightness in posteiror L shoulder. Pt questions when she can return to working out.    From Initial Evaluation: Patient had a left standard total shoulder replacement last year.  She did well for about 6 months then began having progressive pain in the shoulder.  She had to have a shoulder revision with a reverse total shoulder.  At this time her pain is well-controlled.  She is also had a right shoulder replaced.  When she had that replaced she had significant pain.  She reports this 1 is better.  She is wearing her sling outside the house and when sleeping.  She is otherwise has been wearing her sling.  She has increased pain when she moves her arm.  She has been compliant with her  HEP so far. Hand dominance: Right  PERTINENT HISTORY: Multijoint arthritis including knees and hips.  Osteoporosis, right shoulder replacement, mitral valve prolapse  PAIN:  Are you having pain? Yes: NPRS scale: 4/10  Pain location: Right anterior shoulder but runs down lateral arm to elbow Pain description: Aching Aggravating factors: Use of the arm Relieving factors: Not using the arm is much  PRECAUTIONS: Shoulder  RED FLAGS: None   WEIGHT BEARING RESTRICTIONS: No  FALLS:  Has patient fallen in last 6 months? No  LIVING ENVIRONMENT:   OCCUPATION: Nurse   PLOF: Independent  PATIENT GOALS:  Patient wants to be functional   NEXT MD VISIT:  2 weeks prior    OBJECTIVE:  Note: Objective measures were completed at Evaluation unless otherwise noted.  DIAGNOSTIC FINDINGS:  Nothing post op   PATIENT SURVEYS :  FOTO    COGNITION: Overall cognitive status: Within functional limits for tasks assessed     SENSATION: Denies parathesias   POSTURE: Good   UPPER EXTREMITY ROM:   Passive ROM Right eval Left eval Left 1/21 L   Shoulder flexion  85 106 130AROM 160PROM   Shoulder extension      Shoulder abduction      Shoulder adduction      Shoulder internal rotation  Can sit on body comfortably   L gute   Shoulder external rotation  20 26   Elbow flexion      Elbow extension      Wrist flexion      Wrist extension      Wrist ulnar deviation      Wrist radial deviation      Wrist pronation      Wrist supination      (Blank rows = not tested)  UPPER EXTREMITY MMT:  MMT Right eval Left eval  Shoulder flexion    Shoulder extension    Shoulder abduction    Shoulder adduction    Shoulder internal rotation    Shoulder external rotation    Middle trapezius    Lower trapezius    Elbow flexion    Elbow extension    Wrist flexion    Wrist extension    Wrist ulnar deviation    Wrist radial deviation    Wrist pronation    Wrist supination    Grip  strength (lbs)    (Blank rows = not tested) Not tested 2nd to recent surgery      PALPATION:  No unexpected TTP  TREATMENT DATE: 02/28 There Ex: UBE 2 min fwd/ 2 min bwkd with level 2 resistance.  PROM L shoulder IR with strap to tolerance.  AROM flexion into standing Sidelying shoulder abduction 1#x10, 0#x10 Serratus supine punches with 1#x10  Trigger Point Dry Needling  Subsequent Treatment: Instructions provided previously at initial dry needling treatment.   Patient Verbal Consent Given: Yes Education Handout Provided: Previously Provided Muscles Treated: L Subscap and L rhomboid major  Electrical Stimulation Performed: Yes, Parameters: mod frequency, low intensity  Treatment Response/Outcome: Decreased pain and tension noted, no adverse effects.    04/16/2023: PROM L shoulder STM and TPR to UT, lats and periscapular mm.  Scapular mobilizations Supine active shoulder flexion 1# 2x10 Sidelying shoulder abduction 1#x10, 0#x10 Child's pose for flexion stretch Supine ABC x2 with 1#   04/12/2023: PROM L shoulder STM and TPR to UT, lats and periscapular mm.  Supine active shoulder flexion x10 0#, x10 1# Sidelying shoulder abduction x5 0# 2x5 1# Quadruped weight shifting Child's pose for flexion stretch Quadruped cat stretch Supine ABC x1 HEP update   04/06/2023: PROM into flexion with STM to lats. STM and TPR to UT and periscapular mm.  Trigger Point Dry Needling  Initial Treatment: Pt instructed on Dry Needling rational, procedures, and possible side effects. Pt instructed to expect mild to moderate muscle soreness later in the day and/or into the next day.  Pt instructed to continue prescribed HEP. Because Dry Needling was performed over or adjacent to a lung field, pt was educated on S/S of pneumothorax and to seek immediate medical  attention should they occur.  Patient was educated on signs and symptoms of infection and other risk factors and advised to seek medical attention should they occur.  Patient verbalized understanding of these instructions and education.   Patient Verbal Consent Given: Yes Education Handout Provided: Previously Provided Muscles Treated: Subscap, bilat UT Electrical Stimulation Performed: No Treatment Response/Outcome: Multiple trigger point response's. No adverse effects, no noted bleeding or bruising present.  Shoulder abduction and flexion in front of mirror for visual feedback  Walking on treadmill to assess natural arm swing and posture.  Sidelying shoulder abduction 2x10    PATIENT EDUCATION: Education details:  HEP, exercise form, rationale of interventions, protocol restrictions and limitations, protocol ranges, and ROM findings Person educated: Patient Education method: Explanation, Demonstration, Tactile cues, Verbal cues Education comprehension: verbalized understanding, returned demonstration, verbal cues required, tactile cues required, and needs further education  HOME EXERCISE PROGRAM: Access Code: [email or web address] URL: [email or web address]   ASSESSMENT:  CLINICAL IMPRESSION: TPDN to reported "issue spots" with good response following. Pt states that tension keeps moving around.  She remains tight with adhesions present especially throughout medial border of L scapula. Will benefit from additional DN at future sessions. She demonstrates overall good ROM, though tightness at end ranges in comparison to contralateral UE. Continued to challenge pt with strengthening exercises today. She is making progress as noted by measurements today.  Pt will benefit from additional strengthening.    OBJECTIVE IMPAIRMENTS:  decreased ROM, decreased strength, impaired UE functional use, and pain.   ACTIVITY LIMITATIONS: carrying, lifting, sleeping, bathing, toileting, dressing,  self feeding, reach over head, and hygiene/grooming  PARTICIPATION LIMITATIONS: meal prep, cleaning, laundry, driving, shopping, community activity, occupation, and yard work  PERSONAL FACTORS: 1-2 comorbidities: previous replacement on the same shoulder osteoporosis; multi joint OA   are also affecting patient's functional outcome.   REHAB POTENTIAL: Good  CLINICAL DECISION MAKING: Stable/uncomplicated  EVALUATION COMPLEXITY: Low  GOALS: Goals reviewed with patient? Yes  SHORT TERM GOALS: Target date: 04/16/2023    Patient will increase passive flexion to 115 degrees  Baseline: Goal status: INITIAL  2.  Patient will increase left shoulder  ER to 30 degrees  Baseline:  Goal status: INITIAL  3.  Patient will use her left arm for ADL's below 90 degrees with > 2/10 pain  Baseline:  Goal status: INITIAL   LONG TERM GOALS: Target date: 05/28/2023    The patient will reach overhead with left arm without pain in order to perform ADL's  Baseline:  Goal status: INITIAL  2.  Patient will each behind her head without pain in order to per from her ADL's  Baseline:  Goal status: INITIAL  3.  Patient will reach behind her back to L3 without pain in order to perform ADL's  Baseline:  Goal status: INITIAL   PLAN: PT FREQUENCY: 2x/week  PT DURATION: 8 weeks  PLANNED INTERVENTIONS: 97110-Therapeutic exercises, 97530- Therapeutic activity, 97112- Neuromuscular re-education, 97535- Self Care, 08657- Manual therapy, 7113- Aquatic Therapy, 97014- Electrical stimulation (unattended), 97035- Ultrasound, Patient/Family education, Taping, Dry Needling, DME instructions, Cryotherapy, and Moist heat   PLAN FOR NEXT SESSION:  Cont per Dr. Dub Mikes Reverse total shoulder protocol ( will be on Dave's desk )   Champ Mungo, PT 04/19/2023, 8:55 AM

## 2023-04-19 ENCOUNTER — Encounter (HOSPITAL_BASED_OUTPATIENT_CLINIC_OR_DEPARTMENT_OTHER): Payer: Self-pay | Admitting: Physical Therapy

## 2023-04-19 ENCOUNTER — Encounter (HOSPITAL_BASED_OUTPATIENT_CLINIC_OR_DEPARTMENT_OTHER): Payer: 59

## 2023-04-19 ENCOUNTER — Ambulatory Visit (HOSPITAL_BASED_OUTPATIENT_CLINIC_OR_DEPARTMENT_OTHER): Payer: 59 | Admitting: Physical Therapy

## 2023-04-19 DIAGNOSIS — M25612 Stiffness of left shoulder, not elsewhere classified: Secondary | ICD-10-CM

## 2023-04-19 DIAGNOSIS — M25512 Pain in left shoulder: Secondary | ICD-10-CM

## 2023-04-19 DIAGNOSIS — G4709 Other insomnia: Secondary | ICD-10-CM | POA: Diagnosis not present

## 2023-04-19 DIAGNOSIS — G894 Chronic pain syndrome: Secondary | ICD-10-CM | POA: Diagnosis not present

## 2023-04-19 DIAGNOSIS — G3184 Mild cognitive impairment, so stated: Secondary | ICD-10-CM | POA: Diagnosis not present

## 2023-04-23 ENCOUNTER — Encounter (HOSPITAL_BASED_OUTPATIENT_CLINIC_OR_DEPARTMENT_OTHER): Payer: Self-pay | Admitting: Physical Therapy

## 2023-04-23 ENCOUNTER — Ambulatory Visit (HOSPITAL_BASED_OUTPATIENT_CLINIC_OR_DEPARTMENT_OTHER): Payer: 59 | Attending: Orthopedic Surgery | Admitting: Physical Therapy

## 2023-04-23 DIAGNOSIS — M25612 Stiffness of left shoulder, not elsewhere classified: Secondary | ICD-10-CM | POA: Diagnosis not present

## 2023-04-23 DIAGNOSIS — M25512 Pain in left shoulder: Secondary | ICD-10-CM | POA: Insufficient documentation

## 2023-04-23 NOTE — Therapy (Signed)
 OUTPATIENT PHYSICAL THERAPY UPPER EXTREMITY TREATMENT   Patient Name: Tara [name] MRN: [id number] DOB:[birth year], 63 y.o., female Today's Date: 04/23/2023  END OF SESSION:  PT End of Session - 04/23/23 0848     Visit Number 15    Number of Visits 24    Date for PT Re-Evaluation 05/28/23    PT Start Time 0845    PT Stop Time 0924    PT Time Calculation (min) 39 min    Activity Tolerance Patient tolerated treatment well    Behavior During Therapy [facility] for tasks assessed/performed               Past Medical History:  Diagnosis Date   Anemia    Arthritis    Hyperlipidemia    Insomnia    Mitral valve prolapse    PONV (postoperative nausea and vomiting)    Past Surgical History:  Procedure Laterality Date   AUGMENTATION MAMMAPLASTY Bilateral    Dec 2019   BREAST BIOPSY Right    axillary node benign   KNEE SURGERY  1988   right   SHOULDER ARTHROSCOPY WITH LABRAL REPAIR Right 08/28/2012   Procedure: RIGHT SHOULDER ARTHROSCOPY WITH LABRAL DEBRIDEMENT, ASPIRATION AND DECOMPRESSION OF PARALABRAL CYST;  Surgeon: Senaida Lange, MD;  Location: [facility];  Service: Orthopedics;  Laterality: Right;   TOTAL SHOULDER ARTHROPLASTY Right 03/24/2020   Procedure: RIGHT REVERSE SHOULDER ARTHROPLASTY;  Surgeon: Francena Hanly, MD;  Location: [facility];  Service: Orthopedics;  Laterality: Right;    TOTAL SHOULDER ARTHROPLASTY Left 02/23/2021   Procedure: TOTAL SHOULDER ARTHROPLASTY;  Surgeon: Francena Hanly, MD;  Location: [facility];  Service: Orthopedics;  Laterality: Left;   TOTAL SHOULDER REVISION Left 02/07/2023   Procedure: Excision Left shoulder nodule; Conversion of left anatomic shoulder arthroplasty to reverse shoulder arthroplasty;  Surgeon: Francena Hanly, MD;  Location: [facility];  Service: Orthopedics;  Laterality: Left;   Patient Active Problem List   Diagnosis Date Noted   Upper respiratory infection 03/01/2023   OA (osteoarthritis) 03/01/2023   Hyponatremia 03/01/2023    Hypercalciuria 02/02/2023   Neurocognitive deficits 07/07/2021   DDD (degenerative disc disease), cervical 04/13/2021   Osteoporosis without current pathological fracture 10/28/2020   Nonallopathic lesion of sacral region 02/26/2020   Hot flashes 02/23/2019   Other insomnia 02/23/2019   Primary osteoarthritis involving multiple joints 02/23/2019   Cystic acne vulgaris 02/23/2019   Bunion of right foot 02/23/2019   Varicose 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 02/23/2019   Multiple atypical nevi 02/23/2019   Polyarthralgia 08/25/2014   Mitral valve prolapse 03/03/2009    PCP: Carilyn Goodpasture [name]   REFERRING PROVIDER: Kaylyn Layer Supple  REFERRING DIAG:  Diagnosis  [phone] (ICD-10-CM) - Presence of left artificial shoulder joint    THERAPY DIAG:  Acute pain of left shoulder  Stiffness of left shoulder, not elsewhere classified  Rationale for Evaluation and Treatment: Rehabilitation  ONSET DATE:  DOS: 02/07/2023  SUBJECTIVE:  SUBJECTIVE STATEMENT: Neck gets so tight.    From Initial Evaluation: Patient had a left standard total shoulder replacement last year.  She did well for about 6 months then began having progressive pain in the shoulder.  She had to have a shoulder revision with a reverse total shoulder.  At this time her pain is well-controlled.  She is also had a right shoulder replaced.  When she had that replaced she had significant pain.  She reports this 1 is better.  She is wearing her sling outside the house and when sleeping.  She is otherwise has been wearing her sling.  She has increased pain when she moves her arm.  She has been compliant with her HEP so far. Hand dominance: Right  PERTINENT HISTORY: Multijoint arthritis including knees and hips.  Osteoporosis, right shoulder  replacement, mitral valve prolapse  PAIN:  Are you having pain? Yes: NPRS scale: 4/10  Pain location: Right anterior shoulder but runs down lateral arm to elbow Pain description: Aching Aggravating factors: Use of the arm Relieving factors: Not using the arm is much  PRECAUTIONS: Shoulder  RED FLAGS: None   WEIGHT BEARING RESTRICTIONS: No  FALLS:  Has patient fallen in last 6 months? No  LIVING ENVIRONMENT:   OCCUPATION: Nurse   PLOF: Independent  PATIENT GOALS:  Patient wants to be functional   NEXT MD VISIT:  2 weeks prior    OBJECTIVE:  Note: Objective measures were completed at Evaluation unless otherwise noted.  DIAGNOSTIC FINDINGS:  Nothing post op   PATIENT SURVEYS :  FOTO    COGNITION: Overall cognitive status: Within functional limits for tasks assessed     SENSATION: Denies parathesias   POSTURE: Good   UPPER EXTREMITY ROM:   Passive ROM Right eval Left eval Left 1/21 L   Shoulder flexion  85 106 130AROM 160PROM   Shoulder extension      Shoulder abduction      Shoulder adduction      Shoulder internal rotation  Can sit on body comfortably   L gute   Shoulder external rotation  20 26   Elbow flexion      Elbow extension      Wrist flexion      Wrist extension      Wrist ulnar deviation      Wrist radial deviation      Wrist pronation      Wrist supination      (Blank rows = not tested)  UPPER EXTREMITY MMT:  MMT Right eval Left eval  Shoulder flexion    Shoulder extension    Shoulder abduction    Shoulder adduction    Shoulder internal rotation    Shoulder external rotation    Middle trapezius    Lower trapezius    Elbow flexion    Elbow extension    Wrist flexion    Wrist extension    Wrist ulnar deviation    Wrist radial deviation    Wrist pronation    Wrist supination    Grip strength (lbs)    (Blank rows = not tested) Not tested 2nd to recent surgery      PALPATION:  No unexpected TTP  TREATMENT DATE: Treatment                            3/4: Blank lines following charge title = not provided on this treatment date.   Manual:  TPDN YES Trigger Point Dry Needling  Subsequent Treatment: Instructions provided previously at initial dry needling treatment.   Patient Verbal Consent Given: Yes Education Handout Provided: Previously Provided Muscles Treated: Right upper trap Electrical Stimulation Performed: No Treatment Response/Outcome: twitch with decreased concordant tension Prone rib mobs and STM rhomboids, mid & lower traps- focusing on right side There-ex: Incline table slide- hip hinge in seated- + liftoff at end range flexion Flat table bilat shoulder slide+ flexion lift Wall slide  Seated green tband row with ab set There-Act:  Self Care:  Nuro-Re-ed: Core engagement for rib cage flare reduction Gait Training:    02/28 There Ex: UBE 2 min fwd/ 2 min bwkd with level 2 resistance.  PROM L shoulder IR with strap to tolerance.  AROM flexion into standing Sidelying shoulder abduction 1#x10, 0#x10 Serratus supine punches with 1#x10  Trigger Point Dry Needling  Subsequent Treatment: Instructions provided previously at initial dry needling treatment.   Patient Verbal Consent Given: Yes Education Handout Provided: Previously Provided Muscles Treated: L Subscap and L rhomboid major  Electrical Stimulation Performed: Yes, Parameters: mod frequency, low intensity  Treatment Response/Outcome: Decreased pain and tension noted, no adverse effects.    04/16/2023: PROM L shoulder STM and TPR to UT, lats and periscapular mm.  Scapular mobilizations Supine active shoulder flexion 1# 2x10 Sidelying shoulder abduction 1#x10, 0#x10 Child's pose for flexion stretch Supine ABC x2 with 1#   04/12/2023: PROM L shoulder STM and TPR to UT, lats and  periscapular mm.  Supine active shoulder flexion x10 0#, x10 1# Sidelying shoulder abduction x5 0# 2x5 1# Quadruped weight shifting Child's pose for flexion stretch Quadruped cat stretch Supine ABC x1 HEP update   04/06/2023: PROM into flexion with STM to lats. STM and TPR to UT and periscapular mm.  Trigger Point Dry Needling  Initial Treatment: Pt instructed on Dry Needling rational, procedures, and possible side effects. Pt instructed to expect mild to moderate muscle soreness later in the day and/or into the next day.  Pt instructed to continue prescribed HEP. Because Dry Needling was performed over or adjacent to a lung field, pt was educated on S/S of pneumothorax and to seek immediate medical attention should they occur.  Patient was educated on signs and symptoms of infection and other risk factors and advised to seek medical attention should they occur.  Patient verbalized understanding of these instructions and education.   Patient Verbal Consent Given: Yes Education Handout Provided: Previously Provided Muscles Treated: Subscap, bilat UT Electrical Stimulation Performed: No Treatment Response/Outcome: Multiple trigger point response's. No adverse effects, no noted bleeding or bruising present.  Shoulder abduction and flexion in front of mirror for visual feedback  Walking on treadmill to assess natural arm swing and posture.  Sidelying shoulder abduction 2x10    PATIENT EDUCATION: Education details:  HEP, exercise form, rationale of interventions, protocol restrictions and limitations, protocol ranges, and ROM findings Person educated: Patient Education method: Explanation, Demonstration, Tactile cues, Verbal cues Education comprehension: verbalized understanding, returned demonstration, verbal cues required, tactile cues required, and needs further education  HOME EXERCISE PROGRAM: Access Code: [email or web address] URL:  [email or web address]   ASSESSMENT:  CLINICAL IMPRESSION: Focus on right side today in manual therapy to decrease  uneven midline pull. Improved central stability with core engagement.    OBJECTIVE IMPAIRMENTS:  decreased ROM, decreased strength, impaired UE functional use, and pain.   ACTIVITY LIMITATIONS: carrying, lifting, sleeping, bathing, toileting, dressing, self feeding, reach over head, and hygiene/grooming  PARTICIPATION LIMITATIONS: meal prep, cleaning, laundry, driving, shopping, community activity, occupation, and yard work  PERSONAL FACTORS: 1-2 comorbidities: previous replacement on the same shoulder osteoporosis; multi joint OA   are also affecting patient's functional outcome.   REHAB POTENTIAL: Good  CLINICAL DECISION MAKING: Stable/uncomplicated  EVALUATION COMPLEXITY: Low  GOALS: Goals reviewed with patient? Yes  SHORT TERM GOALS: Target date: 04/16/2023    Patient will increase passive flexion to 115 degrees  Baseline: Goal status: INITIAL  2.  Patient will increase left shoulder  ER to 30 degrees  Baseline:  Goal status: INITIAL  3.  Patient will use her left arm for ADL's below 90 degrees with > 2/10 pain  Baseline:  Goal status: INITIAL   LONG TERM GOALS: Target date: 05/28/2023    The patient will reach overhead with left arm without pain in order to perform ADL's  Baseline:  Goal status: INITIAL  2.  Patient will each behind her head without pain in order to per from her ADL's  Baseline:  Goal status: INITIAL  3.  Patient will reach behind her back to L3 without pain in order to perform ADL's  Baseline:  Goal status: INITIAL   PLAN: PT FREQUENCY: 2x/week  PT DURATION: 8 weeks  PLANNED INTERVENTIONS: 97110-Therapeutic exercises, 97530- Therapeutic activity, [id number]- Neuromuscular re-education, 97535- Self Care, 69629- Manual therapy, 7113- Aquatic Therapy, 97014- Electrical stimulation (unattended), 97035- Ultrasound,  Patient/Family education, Taping, Dry Needling, DME instructions, Cryotherapy, and Moist heat   PLAN FOR NEXT SESSION:  Cont per Dr. Dub Mikes Reverse total shoulder protocol ( will be on Dave's desk )   Nyla Creason C. Bradyn Vassey PT, DPT 04/23/23 9:28 AM

## 2023-04-24 ENCOUNTER — Ambulatory Visit
Admission: RE | Admit: 2023-04-24 | Discharge: 2023-04-24 | Disposition: A | Discharge status: Home / Self Care | Payer: 59 | Source: Ambulatory Visit | Attending: Internal Medicine | Admitting: Internal Medicine

## 2023-04-24 ENCOUNTER — Encounter: Payer: Self-pay | Admitting: Internal Medicine

## 2023-04-24 DIAGNOSIS — E2839 Other primary ovarian failure: Secondary | ICD-10-CM | POA: Diagnosis not present

## 2023-04-24 DIAGNOSIS — M8588 Other specified disorders of bone density and structure, other site: Secondary | ICD-10-CM | POA: Diagnosis not present

## 2023-04-24 DIAGNOSIS — N958 Other specified menopausal and perimenopausal disorders: Secondary | ICD-10-CM | POA: Diagnosis not present

## 2023-04-24 DIAGNOSIS — M810 Age-related osteoporosis without current pathological fracture: Secondary | ICD-10-CM

## 2023-04-25 ENCOUNTER — Encounter: Payer: 59 | Attending: Psychology | Admitting: Psychology

## 2023-04-25 DIAGNOSIS — G4709 Other insomnia: Secondary | ICD-10-CM | POA: Diagnosis not present

## 2023-04-25 DIAGNOSIS — G3184 Mild cognitive impairment, so stated: Secondary | ICD-10-CM | POA: Insufficient documentation

## 2023-04-25 DIAGNOSIS — G894 Chronic pain syndrome: Secondary | ICD-10-CM | POA: Diagnosis not present

## 2023-04-26 ENCOUNTER — Ambulatory Visit (HOSPITAL_BASED_OUTPATIENT_CLINIC_OR_DEPARTMENT_OTHER): Payer: 59

## 2023-04-26 ENCOUNTER — Encounter (HOSPITAL_BASED_OUTPATIENT_CLINIC_OR_DEPARTMENT_OTHER): Payer: Self-pay

## 2023-04-26 DIAGNOSIS — M25612 Stiffness of left shoulder, not elsewhere classified: Secondary | ICD-10-CM

## 2023-04-26 DIAGNOSIS — M25512 Pain in left shoulder: Secondary | ICD-10-CM | POA: Diagnosis not present

## 2023-04-26 NOTE — Therapy (Signed)
 OUTPATIENT PHYSICAL THERAPY UPPER EXTREMITY TREATMENT   Patient Name: Tara [name] MRN: [id number] DOB:May 11, 1960, 63 y.o., female Today's Date: 04/26/2023  END OF SESSION:  PT End of Session - 04/26/23 1451     Visit Number 16    Number of Visits 24    Date for PT Re-Evaluation 05/28/23    PT Start Time 1430    PT Stop Time 1516    PT Time Calculation (min) 46 min    Activity Tolerance Patient tolerated treatment well    Behavior During Therapy [facility] for tasks assessed/performed                Past Medical History:  Diagnosis Date   Anemia    Arthritis    Hyperlipidemia    Insomnia    Mitral valve prolapse    PONV (postoperative nausea and vomiting)    Past Surgical History:  Procedure Laterality Date   AUGMENTATION MAMMAPLASTY Bilateral    Dec 2019   BREAST BIOPSY Right    axillary node benign   KNEE SURGERY  1988   right   SHOULDER ARTHROSCOPY WITH LABRAL REPAIR Right 08/28/2012   Procedure: RIGHT SHOULDER ARTHROSCOPY WITH LABRAL DEBRIDEMENT, ASPIRATION AND DECOMPRESSION OF PARALABRAL CYST;  Surgeon: Senaida Lange, MD;  Location: [facility];  Service: Orthopedics;  Laterality: Right;   TOTAL SHOULDER ARTHROPLASTY Right 03/24/2020   Procedure: RIGHT REVERSE SHOULDER ARTHROPLASTY;  Surgeon: Francena Hanly, MD;  Location: [facility];  Service: Orthopedics;  Laterality: Right;    TOTAL SHOULDER ARTHROPLASTY Left 02/23/2021   Procedure: TOTAL SHOULDER ARTHROPLASTY;  Surgeon: Francena Hanly, MD;  Location: [facility];  Service: Orthopedics;  Laterality: Left;   TOTAL SHOULDER REVISION Left 02/07/2023   Procedure: Excision Left shoulder nodule; Conversion of left anatomic shoulder arthroplasty to reverse shoulder arthroplasty;  Surgeon: Francena Hanly, MD;  Location: [facility];  Service: Orthopedics;  Laterality: Left;   Patient Active Problem List   Diagnosis Date Noted   Upper respiratory infection 03/01/2023   OA (osteoarthritis) 03/01/2023   Hyponatremia 03/01/2023    Hypercalciuria 02/02/2023   Neurocognitive deficits 07/07/2021   DDD (degenerative disc disease), cervical 04/13/2021   Osteoporosis without current pathological fracture 10/28/2020   Nonallopathic lesion of sacral region 02/26/2020   Hot flashes 02/23/2019   Other insomnia 02/23/2019   Primary osteoarthritis involving multiple joints 02/23/2019   Cystic acne vulgaris 02/23/2019   Bunion of right foot 02/23/2019   Varicose 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 02/23/2019   Multiple atypical nevi 02/23/2019   Polyarthralgia 08/25/2014   Mitral valve prolapse 03/03/2009    PCP: Carilyn Goodpasture [name]   REFERRING PROVIDER: Kaylyn Layer Supple  REFERRING DIAG:  Diagnosis  [phone] (ICD-10-CM) - Presence of left artificial shoulder joint    THERAPY DIAG:  Acute pain of left shoulder  Stiffness of left shoulder, not elsewhere classified  Rationale for Evaluation and Treatment: Rehabilitation  ONSET DATE:  DOS: 02/07/2023  SUBJECTIVE:  SUBJECTIVE STATEMENT: Pt reports overall improvement. Has bene working on posture with exercises.    From Initial Evaluation: Patient had a left standard total shoulder replacement last year.  She did well for about 6 months then began having progressive pain in the shoulder.  She had to have a shoulder revision with a reverse total shoulder.  At this time her pain is well-controlled.  She is also had a right shoulder replaced.  When she had that replaced she had significant pain.  She reports this 1 is better.  She is wearing her sling outside the house and when sleeping.  She is otherwise has been wearing her sling.  She has increased pain when she moves her arm.  She has been compliant with her HEP so far. Hand dominance: Right  PERTINENT HISTORY: Multijoint arthritis  including knees and hips.  Osteoporosis, right shoulder replacement, mitral valve prolapse  PAIN:  Are you having pain? Yes: NPRS scale: 4/10  Pain location: Right anterior shoulder but runs down lateral arm to elbow Pain description: Aching Aggravating factors: Use of the arm Relieving factors: Not using the arm is much  PRECAUTIONS: Shoulder  RED FLAGS: None   WEIGHT BEARING RESTRICTIONS: No  FALLS:  Has patient fallen in last 6 months? No  LIVING ENVIRONMENT:   OCCUPATION: Nurse   PLOF: Independent  PATIENT GOALS:  Patient wants to be functional   NEXT MD VISIT:  2 weeks prior    OBJECTIVE:  Note: Objective measures were completed at Evaluation unless otherwise noted.  DIAGNOSTIC FINDINGS:  Nothing post op   PATIENT SURVEYS :  FOTO    COGNITION: Overall cognitive status: Within functional limits for tasks assessed     SENSATION: Denies parathesias   POSTURE: Good   UPPER EXTREMITY ROM:   Passive ROM Right eval Left eval Left 1/21 L   Shoulder flexion  85 106 130AROM 160PROM   Shoulder extension      Shoulder abduction      Shoulder adduction      Shoulder internal rotation  Can sit on body comfortably   L gute   Shoulder external rotation  20 26   Elbow flexion      Elbow extension      Wrist flexion      Wrist extension      Wrist ulnar deviation      Wrist radial deviation      Wrist pronation      Wrist supination      (Blank rows = not tested)  UPPER EXTREMITY MMT:  MMT Right eval Left eval  Shoulder flexion    Shoulder extension    Shoulder abduction    Shoulder adduction    Shoulder internal rotation    Shoulder external rotation    Middle trapezius    Lower trapezius    Elbow flexion    Elbow extension    Wrist flexion    Wrist extension    Wrist ulnar deviation    Wrist radial deviation    Wrist pronation    Wrist supination    Grip strength (lbs)    (Blank rows = not tested) Not tested 2nd to recent surgery       PALPATION:  No unexpected TTP  TREATMENT DATE:  3/7 There Ex: Standing active shoulder flexion 1# at mirror Standing active abduction 2x10 at mirror Standing shoulder press x10 Extension behind back with cane  Butterfly stretch for ER in supine PROM L shoulder STM periscap mm Prone I, Y, T x10ea, added 1# to I    Manual: -PROM -STM to periscapular mm    Treatment                            3/4: Blank lines following charge title = not provided on this treatment date.   Manual:  TPDN YES Trigger Point Dry Needling  Subsequent Treatment: Instructions provided previously at initial dry needling treatment.   Patient Verbal Consent Given: Yes Education Handout Provided: Previously Provided Muscles Treated: Right upper trap Electrical Stimulation Performed: No Treatment Response/Outcome: twitch with decreased concordant tension Prone rib mobs and STM rhomboids, mid & lower traps- focusing on right side There-ex: Incline table slide- hip hinge in seated- + liftoff at end range flexion Flat table bilat shoulder slide+ flexion lift Wall slide  Seated green tband row with ab set There-Act:  Self Care:  Nuro-Re-ed: Core engagement for rib cage flare reduction Gait Training:    02/28 There Ex: UBE 2 min fwd/ 2 min bwkd with level 2 resistance.  PROM L shoulder IR with strap to tolerance.  AROM flexion into standing Sidelying shoulder abduction 1#x10, 0#x10 Serratus supine punches with 1#x10  Trigger Point Dry Needling  Subsequent Treatment: Instructions provided previously at initial dry needling treatment.   Patient Verbal Consent Given: Yes Education Handout Provided: Previously Provided Muscles Treated: L Subscap and L rhomboid major  Electrical Stimulation Performed: Yes, Parameters: mod frequency, low intensity  Treatment  Response/Outcome: Decreased pain and tension noted, no adverse effects.       PATIENT EDUCATION: Education details:  HEP, exercise form, rationale of interventions, protocol restrictions and limitations, protocol ranges, and ROM findings Person educated: Patient Education method: Explanation, Demonstration, Tactile cues, Verbal cues Education comprehension: verbalized understanding, returned demonstration, verbal cues required, tactile cues required, and needs further education  HOME EXERCISE PROGRAM: Access Code: [email or web address] URL: [email or web address]   ASSESSMENT:  CLINICAL IMPRESSION: Worked on active movement performance in front of mirror for visual cues of posture and compensatory patterns. She is able to correct these following cues. She was challenged by [facility] press and is limited in abduction AROM compared to R.    OBJECTIVE IMPAIRMENTS:  decreased ROM, decreased strength, impaired UE functional use, and pain.   ACTIVITY LIMITATIONS: carrying, lifting, sleeping, bathing, toileting, dressing, self feeding, reach over head, and hygiene/grooming  PARTICIPATION LIMITATIONS: meal prep, cleaning, laundry, driving, shopping, community activity, occupation, and yard work  PERSONAL FACTORS: 1-2 comorbidities: previous replacement on the same shoulder osteoporosis; multi joint OA   are also affecting patient's functional outcome.   REHAB POTENTIAL: Good  CLINICAL DECISION MAKING: Stable/uncomplicated  EVALUATION COMPLEXITY: Low  GOALS: Goals reviewed with patient? Yes  SHORT TERM GOALS: Target date: 04/16/2023    Patient will increase passive flexion to 115 degrees  Baseline: Goal status: INITIAL  2.  Patient will increase left shoulder  ER to 30 degrees  Baseline:  Goal status: INITIAL  3.  Patient will use her left arm for ADL's below 90 degrees with > 2/10 pain  Baseline:  Goal status: INITIAL   LONG TERM GOALS: Target date: 05/28/2023    The  patient will reach overhead with left arm without pain in  order to perform ADL's  Baseline:  Goal status: INITIAL  2.  Patient will each behind her head without pain in order to per from her ADL's  Baseline:  Goal status: INITIAL  3.  Patient will reach behind her back to L3 without pain in order to perform ADL's  Baseline:  Goal status: INITIAL   PLAN: PT FREQUENCY: 2x/week  PT DURATION: 8 weeks  PLANNED INTERVENTIONS: 97110-Therapeutic exercises, 97530- Therapeutic activity, 97112- Neuromuscular re-education, 97535- Self Care, 16109- Manual therapy, 7113- Aquatic Therapy, 97014- Electrical stimulation (unattended), 97035- Ultrasound, Patient/Family education, Taping, Dry Needling, DME instructions, Cryotherapy, and Moist heat   PLAN FOR NEXT SESSION:  Cont per Dr. Dub Mikes Reverse total shoulder protocol ( will be on Dave's desk )    Lenore Manner, PTA 04/26/2023, 5:23 PM

## 2023-04-30 ENCOUNTER — Encounter (HOSPITAL_BASED_OUTPATIENT_CLINIC_OR_DEPARTMENT_OTHER): Payer: Self-pay

## 2023-04-30 ENCOUNTER — Ambulatory Visit (HOSPITAL_BASED_OUTPATIENT_CLINIC_OR_DEPARTMENT_OTHER): Payer: 59

## 2023-04-30 DIAGNOSIS — M25512 Pain in left shoulder: Secondary | ICD-10-CM

## 2023-04-30 DIAGNOSIS — M25612 Stiffness of left shoulder, not elsewhere classified: Secondary | ICD-10-CM | POA: Diagnosis not present

## 2023-04-30 NOTE — Therapy (Signed)
 OUTPATIENT PHYSICAL THERAPY UPPER EXTREMITY TREATMENT   Patient Name: Tara [name] MRN: [id number] DOB:1960-06-14, 63 y.o., female Today's Date: 04/30/2023  END OF SESSION:  PT End of Session - 04/30/23 1304     Visit Number 17    Number of Visits 24    Date for PT Re-Evaluation 05/28/23    PT Start Time 1302    PT Stop Time 1347    PT Time Calculation (min) 45 min    Activity Tolerance Patient tolerated treatment well    Behavior During Therapy [facility] for tasks assessed/performed                Past Medical History:  Diagnosis Date   Anemia    Arthritis    Hyperlipidemia    Insomnia    Mitral valve prolapse    PONV (postoperative nausea and vomiting)    Past Surgical History:  Procedure Laterality Date   AUGMENTATION MAMMAPLASTY Bilateral    Dec 2019   BREAST BIOPSY Right    axillary node benign   KNEE SURGERY  1988   right   SHOULDER ARTHROSCOPY WITH LABRAL REPAIR Right 08/28/2012   Procedure: RIGHT SHOULDER ARTHROSCOPY WITH LABRAL DEBRIDEMENT, ASPIRATION AND DECOMPRESSION OF PARALABRAL CYST;  Surgeon: Senaida Lange, MD;  Location: [facility];  Service: Orthopedics;  Laterality: Right;   TOTAL SHOULDER ARTHROPLASTY Right 03/24/2020   Procedure: RIGHT REVERSE SHOULDER ARTHROPLASTY;  Surgeon: Francena Hanly, MD;  Location: [facility];  Service: Orthopedics;  Laterality: Right;    TOTAL SHOULDER ARTHROPLASTY Left 02/23/2021   Procedure: TOTAL SHOULDER ARTHROPLASTY;  Surgeon: Francena Hanly, MD;  Location: [facility];  Service: Orthopedics;  Laterality: Left;   TOTAL SHOULDER REVISION Left 02/07/2023   Procedure: Excision Left shoulder nodule; Conversion of left anatomic shoulder arthroplasty to reverse shoulder arthroplasty;  Surgeon: Francena Hanly, MD;  Location: [facility];  Service: Orthopedics;  Laterality: Left;   Patient Active Problem List   Diagnosis Date Noted   Upper respiratory infection 03/01/2023   OA (osteoarthritis) 03/01/2023   Hyponatremia 03/01/2023    Hypercalciuria 02/02/2023   Neurocognitive deficits 07/07/2021   DDD (degenerative disc disease), cervical 04/13/2021   Osteoporosis without current pathological fracture 10/28/2020   Nonallopathic lesion of sacral region 02/26/2020   Hot flashes 02/23/2019   Other insomnia 02/23/2019   Primary osteoarthritis involving multiple joints 02/23/2019   Cystic acne vulgaris 02/23/2019   Bunion of right foot 02/23/2019   Varicose 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 02/23/2019   Multiple atypical nevi 02/23/2019   Polyarthralgia 08/25/2014   Mitral valve prolapse 03/03/2009    PCP: Carilyn Goodpasture [name]   REFERRING PROVIDER: Kaylyn Layer Supple  REFERRING DIAG:  Diagnosis  [phone] (ICD-10-CM) - Presence of left artificial shoulder joint    THERAPY DIAG:  Acute pain of left shoulder  Stiffness of left shoulder, not elsewhere classified  Rationale for Evaluation and Treatment: Rehabilitation  ONSET DATE:  DOS: 02/07/2023  SUBJECTIVE:  SUBJECTIVE STATEMENT: Pt reports increased hypersensitivity in R scapular region which started over the weekend.    From Initial Evaluation: Patient had a left standard total shoulder replacement last year.  She did well for about 6 months then began having progressive pain in the shoulder.  She had to have a shoulder revision with a reverse total shoulder.  At this time her pain is well-controlled.  She is also had a right shoulder replaced.  When she had that replaced she had significant pain.  She reports this 1 is better.  She is wearing her sling outside the house and when sleeping.  She is otherwise has been wearing her sling.  She has increased pain when she moves her arm.  She has been compliant with her HEP so far. Hand dominance: Right  PERTINENT  HISTORY: Multijoint arthritis including knees and hips.  Osteoporosis, right shoulder replacement, mitral valve prolapse  PAIN:  Are you having pain? Yes: NPRS scale: 4/10  Pain location: Right anterior shoulder but runs down lateral arm to elbow Pain description: Aching Aggravating factors: Use of the arm Relieving factors: Not using the arm is much  PRECAUTIONS: Shoulder  RED FLAGS: None   WEIGHT BEARING RESTRICTIONS: No  FALLS:  Has patient fallen in last 6 months? No  LIVING ENVIRONMENT:   OCCUPATION: Nurse   PLOF: Independent  PATIENT GOALS:  Patient wants to be functional   NEXT MD VISIT:  2 weeks prior    OBJECTIVE:  Note: Objective measures were completed at Evaluation unless otherwise noted.  DIAGNOSTIC FINDINGS:  Nothing post op   PATIENT SURVEYS :  FOTO    COGNITION: Overall cognitive status: Within functional limits for tasks assessed     SENSATION: Denies parathesias   POSTURE: Good   UPPER EXTREMITY ROM:   Passive ROM Right eval Left eval Left 1/21 L   Shoulder flexion  85 106 130AROM 160PROM   Shoulder extension      Shoulder abduction      Shoulder adduction      Shoulder internal rotation  Can sit on body comfortably   L gute   Shoulder external rotation  20 26   Elbow flexion      Elbow extension      Wrist flexion      Wrist extension      Wrist ulnar deviation      Wrist radial deviation      Wrist pronation      Wrist supination      (Blank rows = not tested)  UPPER EXTREMITY MMT:  MMT Right eval Left eval  Shoulder flexion    Shoulder extension    Shoulder abduction    Shoulder adduction    Shoulder internal rotation    Shoulder external rotation    Middle trapezius    Lower trapezius    Elbow flexion    Elbow extension    Wrist flexion    Wrist extension    Wrist ulnar deviation    Wrist radial deviation    Wrist pronation    Wrist supination    Grip strength (lbs)    (Blank rows = not tested) Not  tested 2nd to recent surgery      PALPATION:  No unexpected TTP  TREATMENT DATE:  3/11 There Ex: Standing active shoulder flexion x10 L IR behind back Supine "break the stick" 5" x15 SA punches 3# 2x10bil Supine scap retraction   PROM L shoulder STM periscap mm bil, sub occipital mm, SCM/scalenes R   3/7 There Ex: Standing active shoulder flexion 1# at mirror Standing active abduction 2x10 at mirror Standing shoulder press x10 Extension behind back with cane  Butterfly stretch for ER in supine PROM L shoulder STM periscap mm Prone I, Y, T x10ea, added 1# to I  Treatment                            3/4: Blank lines following charge title = not provided on this treatment date.   Manual:  TPDN YES Trigger Point Dry Needling  Subsequent Treatment: Instructions provided previously at initial dry needling treatment.   Patient Verbal Consent Given: Yes Education Handout Provided: Previously Provided Muscles Treated: Right upper trap Electrical Stimulation Performed: No Treatment Response/Outcome: twitch with decreased concordant tension Prone rib mobs and STM rhomboids, mid & lower traps- focusing on right side There-ex: Incline table slide- hip hinge in seated- + liftoff at end range flexion Flat table bilat shoulder slide+ flexion lift Wall slide  Seated green tband row with ab set There-Act:  Self Care:  Nuro-Re-ed: Core engagement for rib cage flare reduction Gait Training:    02/28 There Ex: UBE 2 min fwd/ 2 min bwkd with level 2 resistance.  PROM L shoulder IR with strap to tolerance.  AROM flexion into standing Sidelying shoulder abduction 1#x10, 0#x10 Serratus supine punches with 1#x10  Trigger Point Dry Needling  Subsequent Treatment: Instructions provided previously at initial dry needling treatment.   Patient Verbal  Consent Given: Yes Education Handout Provided: Previously Provided Muscles Treated: L Subscap and L rhomboid major  Electrical Stimulation Performed: Yes, Parameters: mod frequency, low intensity  Treatment Response/Outcome: Decreased pain and tension noted, no adverse effects.       PATIENT EDUCATION: Education details:  HEP, exercise form, rationale of interventions, protocol restrictions and limitations, protocol ranges, and ROM findings Person educated: Patient Education method: Explanation, Demonstration, Tactile cues, Verbal cues Education comprehension: verbalized understanding, returned demonstration, verbal cues required, tactile cues required, and needs further education  HOME EXERCISE PROGRAM: Access Code: [email or web address] URL: [email or web address]   ASSESSMENT:  CLINICAL IMPRESSION: Spent increased time on manual techniques today with focus on R periscapular and cervical complaints. Worked on scapular stability interventions with good tolerance. She reported improved IR behind back following PROM stretching. Pt is showing improvements in ROM but is limited by ongoing hypersensitivity and pain.    OBJECTIVE IMPAIRMENTS:  decreased ROM, decreased strength, impaired UE functional use, and pain.   ACTIVITY LIMITATIONS: carrying, lifting, sleeping, bathing, toileting, dressing, self feeding, reach over head, and hygiene/grooming  PARTICIPATION LIMITATIONS: meal prep, cleaning, laundry, driving, shopping, community activity, occupation, and yard work  PERSONAL FACTORS: 1-2 comorbidities: previous replacement on the same shoulder osteoporosis; multi joint OA   are also affecting patient's functional outcome.   REHAB POTENTIAL: Good  CLINICAL DECISION MAKING: Stable/uncomplicated  EVALUATION COMPLEXITY: Low  GOALS: Goals reviewed with patient? Yes  SHORT TERM GOALS: Target date: 04/16/2023    Patient will increase passive flexion to 115 degrees   Baseline: Goal status: INITIAL  2.  Patient will increase left shoulder  ER to 30 degrees  Baseline:  Goal status: INITIAL  3.  Patient will use her left arm for  ADL's below 90 degrees with > 2/10 pain  Baseline:  Goal status: INITIAL   LONG TERM GOALS: Target date: 05/28/2023    The patient will reach overhead with left arm without pain in order to perform ADL's  Baseline:  Goal status: INITIAL  2.  Patient will each behind her head without pain in order to per from her ADL's  Baseline:  Goal status: INITIAL  3.  Patient will reach behind her back to L3 without pain in order to perform ADL's  Baseline:  Goal status: INITIAL   PLAN: PT FREQUENCY: 2x/week  PT DURATION: 8 weeks  PLANNED INTERVENTIONS: 97110-Therapeutic exercises, 97530- Therapeutic activity, 97112- Neuromuscular re-education, 97535- Self Care, 86578- Manual therapy, 7113- Aquatic Therapy, 97014- Electrical stimulation (unattended), 97035- Ultrasound, Patient/Family education, Taping, Dry Needling, DME instructions, Cryotherapy, and Moist heat   PLAN FOR NEXT SESSION:  Cont per Dr. Dub Mikes Reverse total shoulder protocol ( will be on Dave's desk )    Lenore Manner, PTA 04/30/2023, 5:12 PM

## 2023-05-02 ENCOUNTER — Other Ambulatory Visit (HOSPITAL_BASED_OUTPATIENT_CLINIC_OR_DEPARTMENT_OTHER): Payer: Self-pay

## 2023-05-02 ENCOUNTER — Other Ambulatory Visit (HOSPITAL_COMMUNITY): Payer: Self-pay

## 2023-05-03 ENCOUNTER — Encounter (HOSPITAL_BASED_OUTPATIENT_CLINIC_OR_DEPARTMENT_OTHER): Payer: Self-pay

## 2023-05-03 ENCOUNTER — Ambulatory Visit (HOSPITAL_BASED_OUTPATIENT_CLINIC_OR_DEPARTMENT_OTHER): Payer: 59

## 2023-05-03 DIAGNOSIS — M25512 Pain in left shoulder: Secondary | ICD-10-CM | POA: Diagnosis not present

## 2023-05-03 DIAGNOSIS — M25612 Stiffness of left shoulder, not elsewhere classified: Secondary | ICD-10-CM | POA: Diagnosis not present

## 2023-05-03 NOTE — Therapy (Signed)
 OUTPATIENT PHYSICAL THERAPY UPPER EXTREMITY TREATMENT   Patient Name: Tara [name] MRN: [id number] DOB:[birth year], 63 y.o., female Today's Date: 05/03/2023  END OF SESSION:  PT End of Session - 05/03/23 1435     Visit Number 18    Number of Visits 24    Date for PT Re-Evaluation 05/28/23    PT Start Time 1432    PT Stop Time 1518    PT Time Calculation (min) 46 min    Activity Tolerance Patient tolerated treatment well    Behavior During Therapy [facility] for tasks assessed/performed                 Past Medical History:  Diagnosis Date   Anemia    Arthritis    Hyperlipidemia    Insomnia    Mitral valve prolapse    PONV (postoperative nausea and vomiting)    Past Surgical History:  Procedure Laterality Date   AUGMENTATION MAMMAPLASTY Bilateral    Dec 2019   BREAST BIOPSY Right    axillary node benign   KNEE SURGERY  1988   right   SHOULDER ARTHROSCOPY WITH LABRAL REPAIR Right 08/28/2012   Procedure: RIGHT SHOULDER ARTHROSCOPY WITH LABRAL DEBRIDEMENT, ASPIRATION AND DECOMPRESSION OF PARALABRAL CYST;  Surgeon: Senaida Lange, MD;  Location: [facility];  Service: Orthopedics;  Laterality: Right;   TOTAL SHOULDER ARTHROPLASTY Right 03/24/2020   Procedure: RIGHT REVERSE SHOULDER ARTHROPLASTY;  Surgeon: Francena Hanly, MD;  Location: [facility];  Service: Orthopedics;  Laterality: Right;    TOTAL SHOULDER ARTHROPLASTY Left 02/23/2021   Procedure: TOTAL SHOULDER ARTHROPLASTY;  Surgeon: Francena Hanly, MD;  Location: [facility];  Service: Orthopedics;  Laterality: Left;   TOTAL SHOULDER REVISION Left 02/07/2023   Procedure: Excision Left shoulder nodule; Conversion of left anatomic shoulder arthroplasty to reverse shoulder arthroplasty;  Surgeon: Francena Hanly, MD;  Location: [facility];  Service: Orthopedics;  Laterality: Left;   Patient Active Problem List   Diagnosis Date Noted   Upper respiratory infection 03/01/2023   OA (osteoarthritis) 03/01/2023   Hyponatremia 03/01/2023    Hypercalciuria 02/02/2023   Neurocognitive deficits 07/07/2021   DDD (degenerative disc disease), cervical 04/13/2021   Osteoporosis without current pathological fracture 10/28/2020   Nonallopathic lesion of sacral region 02/26/2020   Hot flashes 02/23/2019   Other insomnia 02/23/2019   Primary osteoarthritis involving multiple joints 02/23/2019   Cystic acne vulgaris 02/23/2019   Bunion of right foot 02/23/2019   Varicose 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 02/23/2019   Multiple atypical nevi 02/23/2019   Polyarthralgia 08/25/2014   Mitral valve prolapse 03/03/2009    PCP: Carilyn Goodpasture [name]   REFERRING PROVIDER: Kaylyn Layer Supple  REFERRING DIAG:  Diagnosis  [phone] (ICD-10-CM) - Presence of left artificial shoulder joint    THERAPY DIAG:  Acute pain of left shoulder  Stiffness of left shoulder, not elsewhere classified  Rationale for Evaluation and Treatment: Rehabilitation  ONSET DATE:  DOS: 02/07/2023  SUBJECTIVE:  SUBJECTIVE STATEMENT: Pt reports improved pain in R shoulder blade  from last session, but now notices left side more.    From Initial Evaluation: Patient had a left standard total shoulder replacement last year.  She did well for about 6 months then began having progressive pain in the shoulder.  She had to have a shoulder revision with a reverse total shoulder.  At this time her pain is well-controlled.  She is also had a right shoulder replaced.  When she had that replaced she had significant pain.  She reports this 1 is better.  She is wearing her sling outside the house and when sleeping.  She is otherwise has been wearing her sling.  She has increased pain when she moves her arm.  She has been compliant with her HEP so far. Hand dominance: Right  PERTINENT  HISTORY: Multijoint arthritis including knees and hips.  Osteoporosis, right shoulder replacement, mitral valve prolapse  PAIN:  Are you having pain? Yes: NPRS scale: 4/10  Pain location: Right anterior shoulder but runs down lateral arm to elbow Pain description: Aching Aggravating factors: Use of the arm Relieving factors: Not using the arm is much  PRECAUTIONS: Shoulder  RED FLAGS: None   WEIGHT BEARING RESTRICTIONS: No  FALLS:  Has patient fallen in last 6 months? No  LIVING ENVIRONMENT:   OCCUPATION: Nurse   PLOF: Independent  PATIENT GOALS:  Patient wants to be functional   NEXT MD VISIT:  2 weeks prior    OBJECTIVE:  Note: Objective measures were completed at Evaluation unless otherwise noted.  DIAGNOSTIC FINDINGS:  Nothing post op   PATIENT SURVEYS :  FOTO    COGNITION: Overall cognitive status: Within functional limits for tasks assessed     SENSATION: Denies parathesias   POSTURE: Good   UPPER EXTREMITY ROM:   Passive ROM Right eval Left eval Left 1/21 L   Shoulder flexion  85 106 130AROM 160PROM   Shoulder extension      Shoulder abduction      Shoulder adduction      Shoulder internal rotation  Can sit on body comfortably   L gute   Shoulder external rotation  20 26   Elbow flexion      Elbow extension      Wrist flexion      Wrist extension      Wrist ulnar deviation      Wrist radial deviation      Wrist pronation      Wrist supination      (Blank rows = not tested)  UPPER EXTREMITY MMT:  MMT Right eval Left eval  Shoulder flexion    Shoulder extension    Shoulder abduction    Shoulder adduction    Shoulder internal rotation    Shoulder external rotation    Middle trapezius    Lower trapezius    Elbow flexion    Elbow extension    Wrist flexion    Wrist extension    Wrist ulnar deviation    Wrist radial deviation    Wrist pronation    Wrist supination    Grip strength (lbs)    (Blank rows = not tested) Not  tested 2nd to recent surgery      PALPATION:  No unexpected TTP  TREATMENT DATE:   3/14 There Ex: SA punches 3# 3x15bil Supine active shoulder flexion 1-2# L shoulder   PROM L shoulder STM periscap mm bil, upper traps, levator scaps, lats  3/11 There Ex: Standing active shoulder flexion x10 L IR behind back Supine "break the stick" 5" x15 SA punches 3# 2x10bil Supine scap retraction   PROM L shoulder STM periscap mm bil, sub occipital mm, SCM/scalenes R   3/7 There Ex: Standing active shoulder flexion 1# at mirror Standing active abduction 2x10 at mirror Standing shoulder press x10 Extension behind back with cane  Butterfly stretch for ER in supine PROM L shoulder STM periscap mm Prone I, Y, T x10ea, added 1# to I  Treatment                            3/4: Blank lines following charge title = not provided on this treatment date.   Manual:  TPDN YES Trigger Point Dry Needling  Subsequent Treatment: Instructions provided previously at initial dry needling treatment.   Patient Verbal Consent Given: Yes Education Handout Provided: Previously Provided Muscles Treated: Right upper trap Electrical Stimulation Performed: No Treatment Response/Outcome: twitch with decreased concordant tension Prone rib mobs and STM rhomboids, mid & lower traps- focusing on right side There-ex: Incline table slide- hip hinge in seated- + liftoff at end range flexion Flat table bilat shoulder slide+ flexion lift Wall slide  Seated green tband row with ab set There-Act:  Self Care:  Nuro-Re-ed: Core engagement for rib cage flare reduction Gait Training:    02/28 There Ex: UBE 2 min fwd/ 2 min bwkd with level 2 resistance.  PROM L shoulder IR with strap to tolerance.  AROM flexion into standing Sidelying shoulder abduction 1#x10, 0#x10 Serratus  supine punches with 1#x10  Trigger Point Dry Needling  Subsequent Treatment: Instructions provided previously at initial dry needling treatment.   Patient Verbal Consent Given: Yes Education Handout Provided: Previously Provided Muscles Treated: L Subscap and L rhomboid major  Electrical Stimulation Performed: Yes, Parameters: mod frequency, low intensity  Treatment Response/Outcome: Decreased pain and tension noted, no adverse effects.       PATIENT EDUCATION: Education details:  HEP, exercise form, rationale of interventions, protocol restrictions and limitations, protocol ranges, and ROM findings Person educated: Patient Education method: Explanation, Demonstration, Tactile cues, Verbal cues Education comprehension: verbalized understanding, returned demonstration, verbal cues required, tactile cues required, and needs further education  HOME EXERCISE PROGRAM: Access Code: [email or web address] URL: [email or web address]   ASSESSMENT:  CLINICAL IMPRESSION: Improving available passive shoulder ROM. Working on building scapular stability and supine active strengthening progressions. She continues to exhibit many areas of restrictions/trigger points, so continued to utilize manual techniques to address these areas. She felt decreased hypersensitivity in these areas compared to previous sessions.    OBJECTIVE IMPAIRMENTS:  decreased ROM, decreased strength, impaired UE functional use, and pain.   ACTIVITY LIMITATIONS: carrying, lifting, sleeping, bathing, toileting, dressing, self feeding, reach over head, and hygiene/grooming  PARTICIPATION LIMITATIONS: meal prep, cleaning, laundry, driving, shopping, community activity, occupation, and yard work  PERSONAL FACTORS: 1-2 comorbidities: previous replacement on the same shoulder osteoporosis; multi joint OA   are also affecting patient's functional outcome.   REHAB POTENTIAL: Good  CLINICAL DECISION MAKING:  Stable/uncomplicated  EVALUATION COMPLEXITY: Low  GOALS: Goals reviewed with patient? Yes  SHORT TERM GOALS: Target date: 04/16/2023    Patient will increase passive flexion to 115 degrees  Baseline: Goal status: INITIAL  2.  Patient will increase left shoulder  ER to 30 degrees  Baseline:  Goal status: INITIAL  3.  Patient will use her left arm for ADL's below 90 degrees with > 2/10 pain  Baseline:  Goal status: INITIAL   LONG TERM GOALS: Target date: 05/28/2023    The patient will reach overhead with left arm without pain in order to perform ADL's  Baseline:  Goal status: INITIAL  2.  Patient will each behind her head without pain in order to per from her ADL's  Baseline:  Goal status: INITIAL  3.  Patient will reach behind her back to L3 without pain in order to perform ADL's  Baseline:  Goal status: INITIAL   PLAN: PT FREQUENCY: 2x/week  PT DURATION: 8 weeks  PLANNED INTERVENTIONS: 97110-Therapeutic exercises, 97530- Therapeutic activity, 97112- Neuromuscular re-education, 97535- Self Care, 84696- Manual therapy, 7113- Aquatic Therapy, 97014- Electrical stimulation (unattended), 97035- Ultrasound, Patient/Family education, Taping, Dry Needling, DME instructions, Cryotherapy, and Moist heat   PLAN FOR NEXT SESSION:  Cont per Dr. Dub Mikes Reverse total shoulder protocol ( will be on Dave's desk )    Lenore Manner, PTA 05/03/2023, 3:53 PM

## 2023-05-07 ENCOUNTER — Encounter (HOSPITAL_BASED_OUTPATIENT_CLINIC_OR_DEPARTMENT_OTHER): Payer: 59 | Admitting: Physical Therapy

## 2023-05-08 NOTE — Progress Notes (Unsigned)
 [name] [address] [place] [address] Phone: [phone] Subjective:   [name], am serving as a scribe for Dr. Antoine Primas.  I'm seeing this patient by the request  of:  [name], [name], NP  CC: Back and neck pain follow-up  IHK:[id number]  Tara SCHWENN is a 63 y.o. female coming in with complaint of back and neck pain. OMT on 02/01/2023. Patient states that she had surgery 12 weeks ago. Back and neck are tight. R glute and hamstring are painful.         Reviewed prior external information including notes and imaging from previsou exam, outside providers and external EMR if available.   As well as notes that were available from care everywhere and other healthcare systems.  Past medical history, social, surgical and family history all reviewed in electronic medical record.  No pertanent information unless stated regarding to the chief complaint.  Patient did have a new bone density done that did show improvement but still osteoporotic.  Past Medical History:  Diagnosis Date   Anemia    Arthritis    Hyperlipidemia    Insomnia    Mitral valve prolapse    PONV (postoperative nausea and vomiting)     Allergies  Allergen Reactions   Codeine Nausea And Vomiting   Hydrocodone Nausea And Vomiting   Triple Antibiotic Pain Relief [Neomy-Bacit-Polymyx-Pramoxine] Other (See Comments)    Burned skin   Triple Antibiotic W/Hydrocortisone  [Bacitra-Neomycin-Polymyxin-Hc]     Burned skin    Contrast Media [Iodinated Contrast Media] Hives    After CT scan with contrast, pt had MRI without contrast. Pt informed MRI Tech that she had a rash on her legs. MRI Tech spoke with Radiologist after MRI was completed. Per Rad, since patient was here x 30 minutes after CT contrast and was doing fine, with rash fading, pt was allowed to leave facility. Alert and oriented. jmh     Review of Systems:  No headache, visual changes, nausea,  vomiting, diarrhea, constipation, dizziness, abdominal pain, skin rash, fevers, chills, night sweats, weight loss, swollen lymph nodes, body aches, joint swelling, chest pain, shortness of breath, mood changes. POSITIVE muscle aches  Objective  Blood pressure 114/82, pulse (!) 55, height 5\' 10"  (1.778 m), last menstrual period 08/14/2012, SpO2 95%.   General: No apparent distress alert and oriented x3 mood and affect normal, dressed appropriately.  HEENT: Pupils equal, extraocular movements intact  Respiratory: Patient's speak in full sentences and does not appear short of breath  Cardiovascular: No lower extremity edema, non tender, no erythema  Gait MSK:  Back does have some loss lordosis noted.  Some tenderness to palpation noted.  Does still have tightness around the sacroiliac joints bilaterally.  Osteopathic findings  C2 flexed rotated and side bent right C6 flexed rotated and side bent left T3 extended rotated and side bent right inhaled rib T9 extended rotated and side bent left L2 flexed rotated and side bent right Sacrum right on right       Assessment and Plan:  DDD (degenerative disc disease), cervical Degenerative disc disease, has made some improvement though.  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  The patient is status post third shoulder replacement.  Doing much better overall.  Hopeful that we are not returning to: Or for her at this moment.  Follow-up again in 6 to 8 weeks otherwise.  Hypercalciuria Continue to follow-up with endocrinology.  We did discuss protein  supplementation is low.    Nonallopathic problems  Decision today to treat with OMT was based on Physical Exam  After verbal consent patient was treated with  ME, FPR techniques in cervical, rib, thoracic, lumbar, and sacral  areas  Patient tolerated the procedure well with improvement in symptoms  Patient given exercises, stretches and lifestyle modifications  See  medications in patient instructions if given  Patient will follow up in 4-8 weeks     The above documentation has been reviewed and is accurate and complete Judi Saa, DO         Note: This dictation was prepared with Dragon dictation along with smaller phrase technology. Any transcriptional errors that result from this process are unintentional.

## 2023-05-09 ENCOUNTER — Encounter: Payer: Commercial Managed Care - PPO | Admitting: Family

## 2023-05-10 ENCOUNTER — Encounter: Payer: Self-pay | Admitting: Podiatry

## 2023-05-10 ENCOUNTER — Other Ambulatory Visit (HOSPITAL_COMMUNITY): Payer: Self-pay

## 2023-05-10 ENCOUNTER — Ambulatory Visit: Payer: 59 | Admitting: Family Medicine

## 2023-05-10 ENCOUNTER — Other Ambulatory Visit: Payer: Self-pay

## 2023-05-10 ENCOUNTER — Ambulatory Visit (INDEPENDENT_AMBULATORY_CARE_PROVIDER_SITE_OTHER): Payer: 59 | Admitting: Podiatry

## 2023-05-10 ENCOUNTER — Ambulatory Visit (HOSPITAL_BASED_OUTPATIENT_CLINIC_OR_DEPARTMENT_OTHER): Payer: 59

## 2023-05-10 ENCOUNTER — Encounter (HOSPITAL_BASED_OUTPATIENT_CLINIC_OR_DEPARTMENT_OTHER): Payer: Self-pay

## 2023-05-10 ENCOUNTER — Encounter: Payer: Self-pay | Admitting: Family Medicine

## 2023-05-10 VITALS — BP 114/82 | HR 55 | Ht 70.0 in

## 2023-05-10 DIAGNOSIS — M21612 Bunion of left foot: Secondary | ICD-10-CM

## 2023-05-10 DIAGNOSIS — R82994 Hypercalciuria: Secondary | ICD-10-CM | POA: Diagnosis not present

## 2023-05-10 DIAGNOSIS — M25512 Pain in left shoulder: Secondary | ICD-10-CM

## 2023-05-10 DIAGNOSIS — Z79899 Other long term (current) drug therapy: Secondary | ICD-10-CM

## 2023-05-10 DIAGNOSIS — M9903 Segmental and somatic dysfunction of lumbar region: Secondary | ICD-10-CM | POA: Diagnosis not present

## 2023-05-10 DIAGNOSIS — B351 Tinea unguium: Secondary | ICD-10-CM

## 2023-05-10 DIAGNOSIS — M9901 Segmental and somatic dysfunction of cervical region: Secondary | ICD-10-CM

## 2023-05-10 DIAGNOSIS — M25612 Stiffness of left shoulder, not elsewhere classified: Secondary | ICD-10-CM

## 2023-05-10 DIAGNOSIS — M21611 Bunion of right foot: Secondary | ICD-10-CM | POA: Diagnosis not present

## 2023-05-10 DIAGNOSIS — M9904 Segmental and somatic dysfunction of sacral region: Secondary | ICD-10-CM | POA: Diagnosis not present

## 2023-05-10 DIAGNOSIS — M9908 Segmental and somatic dysfunction of rib cage: Secondary | ICD-10-CM | POA: Diagnosis not present

## 2023-05-10 DIAGNOSIS — M9902 Segmental and somatic dysfunction of thoracic region: Secondary | ICD-10-CM | POA: Diagnosis not present

## 2023-05-10 DIAGNOSIS — M5030 Other cervical disc degeneration, unspecified cervical region: Secondary | ICD-10-CM

## 2023-05-10 MED ORDER — TERBINAFINE HCL 250 MG PO TABS
250.0000 mg | ORAL_TABLET | Freq: Every day | ORAL | 0 refills | Status: DC
  Filled 2023-05-10: qty 90, 90d supply, fill #0

## 2023-05-10 NOTE — Patient Instructions (Addendum)
 Third time is a charm Happy with bone density Increase activity when you can See me again in 3 months

## 2023-05-10 NOTE — Assessment & Plan Note (Signed)
 Continue to follow-up with endocrinology.  We did discuss protein supplementation is low.

## 2023-05-10 NOTE — Progress Notes (Signed)
 Subjective: Chief Complaint  Patient presents with   Nail Problem    Rm#11 Toe nail cracking has been treating at home and no improvement.     63 year old female presents the office today with above concerns.  States that she is having difficulty with the topical and the toenails.  She is not seeing improvement in the nails.  She has no pain associated with this. Denies any redness or any drainage.  Also has calluses to her feet.  She tried shoes, inserts.  She tries to keep the calluses trimmed down.   Objective: AAO x3, NAD DP/PT pulses palpable bilaterally, CRT less than 3 seconds Hammertoes are present as well as mild bunion.  Hyperkeratotic lesions on the medial hallux, first MTPJ bilaterally without any underlying ulceration drainage or signs of infection.  Nails are mildly hypertrophic, dystrophic with yellow-brown discoloration.   No edema, erythema or signs of infection.  No pain with calf compression, swelling, warmth, erythema  Assessment: Hyperkeratotic lesions, hammertoe/bunion deformity, onychomycosis  Plan: -All treatment options discussed with the patient including all alternatives, risks, complications.  -As a courtesy debride the calluses without any complications or bleeding.  Minimal.  Continue moisturizer, offloading.  Continue shoes, good arch support.  Unfortunately given the bunion, foot structure this is contributing to the callus. -We discussed different treatment options for the nails.  I sharply debrided nails and complications of bleeding.  Discussed oral, topical medications.  Prescribed oral Lamisil.  Discussed risks, side effects, success rates.  Recent blood work in January was normal.  Will recheck blood work in 4 weeks.  Order given.  Vivi Barrack DPM

## 2023-05-10 NOTE — Patient Instructions (Signed)

## 2023-05-10 NOTE — Assessment & Plan Note (Signed)
 Degenerative disc disease, has made some improvement though.  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  The patient is status post third shoulder replacement.  Doing much better overall.  Hopeful that we are not returning to: Or for her at this moment.  Follow-up again in 6 to 8 weeks otherwise.

## 2023-05-10 NOTE — Therapy (Signed)
 OUTPATIENT PHYSICAL THERAPY UPPER EXTREMITY TREATMENT   Patient Name: Tara [name] MRN: [id number] DOB:Dec 05, 1960, 63 y.o., female Today's Date: 05/10/2023  END OF SESSION:  PT End of Session - 05/10/23 1351     Visit Number 19    Number of Visits 24    Date for PT Re-Evaluation 05/28/23    PT Start Time 1349    PT Stop Time 1430    PT Time Calculation (min) 41 min    Activity Tolerance Patient tolerated treatment well    Behavior During Therapy [facility] for tasks assessed/performed                  Past Medical History:  Diagnosis Date   Anemia    Arthritis    Hyperlipidemia    Insomnia    Mitral valve prolapse    PONV (postoperative nausea and vomiting)    Past Surgical History:  Procedure Laterality Date   AUGMENTATION MAMMAPLASTY Bilateral    Dec 2019   BREAST BIOPSY Right    axillary node benign   KNEE SURGERY  1988   right   SHOULDER ARTHROSCOPY WITH LABRAL REPAIR Right 08/28/2012   Procedure: RIGHT SHOULDER ARTHROSCOPY WITH LABRAL DEBRIDEMENT, ASPIRATION AND DECOMPRESSION OF PARALABRAL CYST;  Surgeon: Senaida Lange, MD;  Location: [facility] OR;  Service: Orthopedics;  Laterality: Right;   TOTAL SHOULDER ARTHROPLASTY Right 03/24/2020   Procedure: RIGHT REVERSE SHOULDER ARTHROPLASTY;  Surgeon: Francena Hanly, MD;  Location: [facility]S;  Service: Orthopedics;  Laterality: Right;    TOTAL SHOULDER ARTHROPLASTY Left 02/23/2021   Procedure: TOTAL SHOULDER ARTHROPLASTY;  Surgeon: Francena Hanly, MD;  Location: [facility];  Service: Orthopedics;  Laterality: Left;   TOTAL SHOULDER REVISION Left 02/07/2023   Procedure: Excision Left shoulder nodule; Conversion of left anatomic shoulder arthroplasty to reverse shoulder arthroplasty;  Surgeon: Francena Hanly, MD;  Location: [facility];  Service: Orthopedics;  Laterality: Left;   Patient Active Problem List   Diagnosis Date Noted   Onychomycosis 05/10/2023   Upper respiratory infection 03/01/2023   OA (osteoarthritis) 03/01/2023    Hyponatremia 03/01/2023   Hypercalciuria 02/02/2023   Neurocognitive deficits 07/07/2021   DDD (degenerative disc disease), cervical 04/13/2021   Osteoporosis without current pathological fracture 10/28/2020   Nonallopathic lesion of sacral region 02/26/2020   Hot flashes 02/23/2019   Other insomnia 02/23/2019   Primary osteoarthritis involving multiple joints 02/23/2019   Cystic acne vulgaris 02/23/2019   Bunion of right foot 02/23/2019   Varicose 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 02/23/2019   Multiple atypical nevi 02/23/2019   Polyarthralgia 08/25/2014   Mitral valve prolapse 03/03/2009    PCP: Carilyn Goodpasture [name]   REFERRING PROVIDER: Kaylyn Layer Supple  REFERRING DIAG:  Diagnosis  [phone] (ICD-10-CM) - Presence of left artificial shoulder joint    THERAPY DIAG:  Acute pain of left shoulder  Stiffness of left shoulder, not elsewhere classified  Rationale for Evaluation and Treatment: Rehabilitation  ONSET DATE:  DOS: 02/07/2023  SUBJECTIVE:  SUBJECTIVE STATEMENT: Pt reports very mild improvement in L scapular area. R scapular area has improved.    From Initial Evaluation: Patient had a left standard total shoulder replacement last year.  She did well for about 6 months then began having progressive pain in the shoulder.  She had to have a shoulder revision with a reverse total shoulder.  At this time her pain is well-controlled.  She is also had a right shoulder replaced.  When she had that replaced she had significant pain.  She reports this 1 is better.  She is wearing her sling outside the house and when sleeping.  She is otherwise has been wearing her sling.  She has increased pain when she moves her arm.  She has been compliant with her HEP so far. Hand dominance:  Right  PERTINENT HISTORY: Multijoint arthritis including knees and hips.  Osteoporosis, right shoulder replacement, mitral valve prolapse  PAIN:  Are you having pain? Yes: NPRS scale: 4/10  Pain location: Right anterior shoulder but runs down lateral arm to elbow Pain description: Aching Aggravating factors: Use of the arm Relieving factors: Not using the arm is much  PRECAUTIONS: Shoulder  RED FLAGS: None   WEIGHT BEARING RESTRICTIONS: No  FALLS:  Has patient fallen in last 6 months? No  LIVING ENVIRONMENT:   OCCUPATION: Nurse   PLOF: Independent  PATIENT GOALS:  Patient wants to be functional   NEXT MD VISIT:  2 weeks prior    OBJECTIVE:  Note: Objective measures were completed at Evaluation unless otherwise noted.  DIAGNOSTIC FINDINGS:  Nothing post op   PATIENT SURVEYS :  FOTO    COGNITION: Overall cognitive status: Within functional limits for tasks assessed     SENSATION: Denies parathesias   POSTURE: Good   UPPER EXTREMITY ROM:   Passive ROM Right eval Left eval Left 1/21 L   Shoulder flexion  85 106 130AROM 160PROM   Shoulder extension      Shoulder abduction      Shoulder adduction      Shoulder internal rotation  Can sit on body comfortably   L gute   Shoulder external rotation  20 26   Elbow flexion      Elbow extension      Wrist flexion      Wrist extension      Wrist ulnar deviation      Wrist radial deviation      Wrist pronation      Wrist supination      (Blank rows = not tested)  UPPER EXTREMITY MMT:  MMT Right eval Left eval  Shoulder flexion    Shoulder extension    Shoulder abduction    Shoulder adduction    Shoulder internal rotation    Shoulder external rotation    Middle trapezius    Lower trapezius    Elbow flexion    Elbow extension    Wrist flexion    Wrist extension    Wrist ulnar deviation    Wrist radial deviation    Wrist pronation    Wrist supination    Grip strength (lbs)    (Blank rows  = not tested) Not tested 2nd to recent surgery      PALPATION:  No unexpected TTP  TREATMENT DATE:  3/21 There Ex: Supine active shoulder flexion 2# L shoulder 2x10 Standing Y's bilateral in front of mirror (tactile cues for decreasing UT hike) Standing bil shoulder flexion in front of mirror  Ball rolls 4 way at wall with 2.2# ball x15ea   Manual: PROM L shoulder STM periscap mm bil, upper traps, levator scaps, lats   3/14 There Ex: SA punches 3# 3x15bil Supine active shoulder flexion 1-2# L shoulder   PROM L shoulder STM periscap mm bil, upper traps, levator scaps, lats  3/11 There Ex: Standing active shoulder flexion x10 L IR behind back Supine "break the stick" 5" x15 SA punches 3# 2x10bil Supine scap retraction   PROM L shoulder STM periscap mm bil, sub occipital mm, SCM/scalenes R   3/7 There Ex: Standing active shoulder flexion 1# at mirror Standing active abduction 2x10 at mirror Standing shoulder press x10 Extension behind back with cane  Butterfly stretch for ER in supine PROM L shoulder STM periscap mm Prone I, Y, T x10ea, added 1# to I  Treatment                            3/4: Blank lines following charge title = not provided on this treatment date.   Manual:  TPDN YES Trigger Point Dry Needling  Subsequent Treatment: Instructions provided previously at initial dry needling treatment.   Patient Verbal Consent Given: Yes Education Handout Provided: Previously Provided Muscles Treated: Right upper trap Electrical Stimulation Performed: No Treatment Response/Outcome: twitch with decreased concordant tension Prone rib mobs and STM rhomboids, mid & lower traps- focusing on right side There-ex: Incline table slide- hip hinge in seated- + liftoff at end range flexion Flat table bilat shoulder slide+ flexion lift Wall  slide  Seated green tband row with ab set There-Act:  Self Care:  Nuro-Re-ed: Core engagement for rib cage flare reduction Gait Training:    02/28 There Ex: UBE 2 min fwd/ 2 min bwkd with level 2 resistance.  PROM L shoulder IR with strap to tolerance.  AROM flexion into standing Sidelying shoulder abduction 1#x10, 0#x10 Serratus supine punches with 1#x10  Trigger Point Dry Needling  Subsequent Treatment: Instructions provided previously at initial dry needling treatment.   Patient Verbal Consent Given: Yes Education Handout Provided: Previously Provided Muscles Treated: L Subscap and L rhomboid major  Electrical Stimulation Performed: Yes, Parameters: mod frequency, low intensity  Treatment Response/Outcome: Decreased pain and tension noted, no adverse effects.       PATIENT EDUCATION: Education details:  HEP, exercise form, rationale of interventions, protocol restrictions and limitations, protocol ranges, and ROM findings Person educated: Patient Education method: Explanation, Demonstration, Tactile cues, Verbal cues Education comprehension: verbalized understanding, returned demonstration, verbal cues required, tactile cues required, and needs further education  HOME EXERCISE PROGRAM: Access Code: [email or web address] URL: [email or web address]   ASSESSMENT:  CLINICAL IMPRESSION: Continued to work on STM to bilateral scapular regions to address ongoing restrictions. Pt improving with L shoulder ROM al planes. With strength, she was able to better tolerate use of 2# weight with supine L shoulder flexion. She is more challenged with standing shoulder AROM, demonstrating L UT hiking. This does improve with light tactile cueing. Held weight with standing AROM- will wait until mechanics are improved. Instructed her in self progression of strength with supine and reclined positions. Trialed ball rolls at wall 4way today with 2.2# ball, which was fatiguing.     OBJECTIVE IMPAIRMENTS:  decreased ROM,  decreased strength, impaired UE functional use, and pain.   ACTIVITY LIMITATIONS: carrying, lifting, sleeping, bathing, toileting, dressing, self feeding, reach over head, and hygiene/grooming  PARTICIPATION LIMITATIONS: meal prep, cleaning, laundry, driving, shopping, community activity, occupation, and yard work  PERSONAL FACTORS: 1-2 comorbidities: previous replacement on the same shoulder osteoporosis; multi joint OA   are also affecting patient's functional outcome.   REHAB POTENTIAL: Good  CLINICAL DECISION MAKING: Stable/uncomplicated  EVALUATION COMPLEXITY: Low  GOALS: Goals reviewed with patient? Yes  SHORT TERM GOALS: Target date: 04/16/2023    Patient will increase passive flexion to 115 degrees  Baseline: Goal status: INITIAL  2.  Patient will increase left shoulder  ER to 30 degrees  Baseline:  Goal status: INITIAL  3.  Patient will use her left arm for ADL's below 90 degrees with > 2/10 pain  Baseline:  Goal status: INITIAL   LONG TERM GOALS: Target date: 05/28/2023    The patient will reach overhead with left arm without pain in order to perform ADL's  Baseline:  Goal status: INITIAL  2.  Patient will each behind her head without pain in order to per from her ADL's  Baseline:  Goal status: INITIAL  3.  Patient will reach behind her back to L3 without pain in order to perform ADL's  Baseline:  Goal status: INITIAL   PLAN: PT FREQUENCY: 2x/week  PT DURATION: 8 weeks  PLANNED INTERVENTIONS: 97110-Therapeutic exercises, 97530- Therapeutic activity, 97112- Neuromuscular re-education, 97535- Self Care, 41324- Manual therapy, 7113- Aquatic Therapy, 97014- Electrical stimulation (unattended), 97035- Ultrasound, Patient/Family education, Taping, Dry Needling, DME instructions, Cryotherapy, and Moist heat   PLAN FOR NEXT SESSION:  Cont per Dr. Dub Mikes Reverse total shoulder protocol ( will be on Dave's desk  )    Lenore Manner, PTA 05/10/2023, 3:04 PM

## 2023-05-13 ENCOUNTER — Telehealth: Payer: Self-pay

## 2023-05-13 DIAGNOSIS — Z96612 Presence of left artificial shoulder joint: Secondary | ICD-10-CM | POA: Diagnosis not present

## 2023-05-13 DIAGNOSIS — Z5189 Encounter for other specified aftercare: Secondary | ICD-10-CM | POA: Diagnosis not present

## 2023-05-13 NOTE — Telephone Encounter (Signed)
 Patient is scheduled for 07/05/2023 for Prolia.  Will there be any out of pocket due at check in?

## 2023-05-13 NOTE — Telephone Encounter (Signed)
 Contact patient to scheduled next Prolia injection.

## 2023-05-13 NOTE — Telephone Encounter (Signed)
 Pt ready for scheduling on or after 12/29/22   Out-of-pocket cost due at time of visit: $0   Primary: [employer] Prolia co-insurance: 0% Admin fee co-insurance: 0%   Deductible: does not apply   Prior Auth: APPROVED PA# [id number]  Valid: 06/15/22-06/15/23   Secondary: N/A Prolia co-insurance:  Admin fee co-insurance:  Deductible:  Prior Auth:  PA# Valid:

## 2023-05-14 ENCOUNTER — Ambulatory Visit (HOSPITAL_BASED_OUTPATIENT_CLINIC_OR_DEPARTMENT_OTHER): Payer: 59

## 2023-05-14 ENCOUNTER — Encounter (HOSPITAL_BASED_OUTPATIENT_CLINIC_OR_DEPARTMENT_OTHER): Payer: Self-pay

## 2023-05-14 DIAGNOSIS — M25512 Pain in left shoulder: Secondary | ICD-10-CM | POA: Diagnosis not present

## 2023-05-14 DIAGNOSIS — M25612 Stiffness of left shoulder, not elsewhere classified: Secondary | ICD-10-CM | POA: Diagnosis not present

## 2023-05-14 NOTE — Therapy (Signed)
 OUTPATIENT PHYSICAL THERAPY UPPER EXTREMITY TREATMENT   Patient Name: Tara [name] MRN: [id number] DOB:1960-06-10, 63 y.o., female Today's Date: 05/14/2023  END OF SESSION:  PT End of Session - 05/14/23 1151     Visit Number 20    Number of Visits 24    Date for PT Re-Evaluation 05/28/23    PT Start Time 1148    PT Stop Time 1232    PT Time Calculation (min) 44 min    Activity Tolerance Patient tolerated treatment well    Behavior During Therapy [facility] for tasks assessed/performed                   Past Medical History:  Diagnosis Date   Anemia    Arthritis    Hyperlipidemia    Insomnia    Mitral valve prolapse    PONV (postoperative nausea and vomiting)    Past Surgical History:  Procedure Laterality Date   AUGMENTATION MAMMAPLASTY Bilateral    Dec 2019   BREAST BIOPSY Right    axillary node benign   KNEE SURGERY  1988   right   SHOULDER ARTHROSCOPY WITH LABRAL REPAIR Right 08/28/2012   Procedure: RIGHT SHOULDER ARTHROSCOPY WITH LABRAL DEBRIDEMENT, ASPIRATION AND DECOMPRESSION OF PARALABRAL CYST;  Surgeon: Senaida Lange, MD;  Location: [facility] OR;  Service: Orthopedics;  Laterality: Right;   TOTAL SHOULDER ARTHROPLASTY Right 03/24/2020   Procedure: RIGHT REVERSE SHOULDER ARTHROPLASTY;  Surgeon: Francena Hanly, MD;  Location: [facility];  Service: Orthopedics;  Laterality: Right;    TOTAL SHOULDER ARTHROPLASTY Left 02/23/2021   Procedure: TOTAL SHOULDER ARTHROPLASTY;  Surgeon: Francena Hanly, MD;  Location: [facility];  Service: Orthopedics;  Laterality: Left;   TOTAL SHOULDER REVISION Left 02/07/2023   Procedure: Excision Left shoulder nodule; Conversion of left anatomic shoulder arthroplasty to reverse shoulder arthroplasty;  Surgeon: Francena Hanly, MD;  Location: [facility];  Service: Orthopedics;  Laterality: Left;   Patient Active Problem List   Diagnosis Date Noted   Onychomycosis 05/10/2023   Upper respiratory infection 03/01/2023   OA (osteoarthritis)  03/01/2023   Hyponatremia 03/01/2023   Hypercalciuria 02/02/2023   Neurocognitive deficits 07/07/2021   DDD (degenerative disc disease), cervical 04/13/2021   Osteoporosis without current pathological fracture 10/28/2020   Nonallopathic lesion of sacral region 02/26/2020   Hot flashes 02/23/2019   Other insomnia 02/23/2019   Primary osteoarthritis involving multiple joints 02/23/2019   Cystic acne vulgaris 02/23/2019   Bunion of right foot 02/23/2019   Varicose 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 02/23/2019   Multiple atypical nevi 02/23/2019   Polyarthralgia 08/25/2014   Mitral valve prolapse 03/03/2009    PCP: Carilyn Goodpasture [name]   REFERRING PROVIDER: Kaylyn Layer Supple  REFERRING DIAG:  Diagnosis  [phone] (ICD-10-CM) - Presence of left artificial shoulder joint    THERAPY DIAG:  Stiffness of left shoulder, not elsewhere classified  Acute pain of left shoulder  Rationale for Evaluation and Treatment: Rehabilitation  ONSET DATE:  DOS: 02/07/2023  SUBJECTIVE:  SUBJECTIVE STATEMENT: Pt reports no improvement in scapular and shoulder tension.    From Initial Evaluation: Patient had a left standard total shoulder replacement last year.  She did well for about 6 months then began having progressive pain in the shoulder.  She had to have a shoulder revision with a reverse total shoulder.  At this time her pain is well-controlled.  She is also had a right shoulder replaced.  When she had that replaced she had significant pain.  She reports this 1 is better.  She is wearing her sling outside the house and when sleeping.  She is otherwise has been wearing her sling.  She has increased pain when she moves her arm.  She has been compliant with her HEP so far. Hand dominance: Right  PERTINENT  HISTORY: Multijoint arthritis including knees and hips.  Osteoporosis, right shoulder replacement, mitral valve prolapse  PAIN:  Are you having pain? Yes: NPRS scale: 4/10  Pain location: Right anterior shoulder but runs down lateral arm to elbow Pain description: Aching Aggravating factors: Use of the arm Relieving factors: Not using the arm is much  PRECAUTIONS: Shoulder  RED FLAGS: None   WEIGHT BEARING RESTRICTIONS: No  FALLS:  Has patient fallen in last 6 months? No  LIVING ENVIRONMENT:   OCCUPATION: Nurse   PLOF: Independent  PATIENT GOALS:  Patient wants to be functional   NEXT MD VISIT:  2 weeks prior    OBJECTIVE:  Note: Objective measures were completed at Evaluation unless otherwise noted.  DIAGNOSTIC FINDINGS:  Nothing post op   PATIENT SURVEYS :  FOTO    COGNITION: Overall cognitive status: Within functional limits for tasks assessed     SENSATION: Denies parathesias   POSTURE: Good   UPPER EXTREMITY ROM:   Passive ROM Right eval Left eval Left 1/21 L  L  Shoulder flexion  85 106 130AROM 160PROM  148AROM supine 158PROM  Shoulder extension       Shoulder abduction     112 active supine  Shoulder adduction       Shoulder internal rotation  Can sit on body comfortably   L gute  65/L iliac crest but can get to L4 with some compensation  Shoulder external rotation  20 26  50/T3  Elbow flexion       Elbow extension       Wrist flexion       Wrist extension       Wrist ulnar deviation       Wrist radial deviation       Wrist pronation       Wrist supination       (Blank rows = not tested)  UPPER EXTREMITY MMT:  MMT Right 3/25 Left 3/25  Shoulder flexion 15.4 8.2  Shoulder extension    Shoulder abduction 9.9 6.8  Shoulder adduction    Shoulder internal rotation 11.3 8.8  Shoulder external rotation 11.5 8.1  Middle trapezius    Lower trapezius    Elbow flexion    Elbow extension    Wrist flexion    Wrist extension     Wrist ulnar deviation    Wrist radial deviation    Wrist pronation    Wrist supination    Grip strength (lbs)    (Blank rows = not tested) Not tested 2nd to recent surgery      PALPATION:  No unexpected TTP  TREATMENT DATE:  3/25 There Ex: Updated MMT and ROM Prone IYT on physioball x10ea 0#, x10ea 1# Ball rolls 4 way at wall with 2.2# ball x15ea   TherAct: Standing shoulder flexion YTB 2x10 to ~75deg Standing bil PNF D2 x10 Wiping counter with wash cloth to simulate housework  Manual: PROM L shoulder STM periscap mm bil, upper traps, levator scaps, lats   3/21 There Ex: Supine active shoulder flexion 2# L shoulder 2x10 Standing Y's bilateral in front of mirror (tactile cues for decreasing UT hike) Standing bil shoulder flexion in front of mirror  Ball rolls 4 way at wall with 2.2# ball x15ea   Manual: PROM L shoulder STM periscap mm bil, upper traps, levator scaps, lats   3/14 There Ex: SA punches 3# 3x15bil Supine active shoulder flexion 1-2# L shoulder   PROM L shoulder STM periscap mm bil, upper traps, levator scaps, lats      PATIENT EDUCATION: Education details:  HEP, exercise form, rationale of interventions, protocol restrictions and limitations, protocol ranges, and ROM findings Person educated: Patient Education method: Explanation, Demonstration, Tactile cues, Verbal cues Education comprehension: verbalized understanding, returned demonstration, verbal cues required, tactile cues required, and needs further education  HOME EXERCISE PROGRAM: Access Code: [email or web address] URL: [email or web address]   ASSESSMENT:  CLINICAL IMPRESSION: Pt demonstrates improvement in ROM since last measured. Worked on bilateral scapular strengthening with I,Y,T prone on physioball. She was able to tolerate addition of 1#  resistance for second set with this. Reported moderate fatigue and challenge with this. Simulated cleaning surfaces as pt reports this is challenging with L hand at home. Pt demonstrates improved ability to decrease shoulder hike with active elevation, though still present to a lesser degree.    OBJECTIVE IMPAIRMENTS:  decreased ROM, decreased strength, impaired UE functional use, and pain.   ACTIVITY LIMITATIONS: carrying, lifting, sleeping, bathing, toileting, dressing, self feeding, reach over head, and hygiene/grooming  PARTICIPATION LIMITATIONS: meal prep, cleaning, laundry, driving, shopping, community activity, occupation, and yard work  PERSONAL FACTORS: 1-2 comorbidities: previous replacement on the same shoulder osteoporosis; multi joint OA   are also affecting patient's functional outcome.   REHAB POTENTIAL: Good  CLINICAL DECISION MAKING: Stable/uncomplicated  EVALUATION COMPLEXITY: Low  GOALS: Goals reviewed with patient? Yes  SHORT TERM GOALS: Target date: 04/16/2023    Patient will increase passive flexion to 115 degrees  Baseline: Goal status: MET  2.  Patient will increase left shoulder  ER to 30 degrees  Baseline:  Goal status: MET  3.  Patient will use her left arm for ADL's below 90 degrees with > 2/10 pain  Baseline:  Goal status: IN PROGRESS   LONG TERM GOALS: Target date: 05/28/2023    The patient will reach overhead with left arm without pain in order to perform ADL's  Baseline:  Goal status: IN PROGRESS  2.  Patient will each behind her head without pain in order to per from her ADL's  Baseline:  Goal status: IN PROGRESS  3.  Patient will reach behind her back to L3 without pain in order to perform ADL's  Baseline:  Goal status: IN PROGRESS L4 on 3/25   PLAN: PT FREQUENCY: 2x/week  PT DURATION: 8 weeks  PLANNED INTERVENTIONS: 97110-Therapeutic exercises, 97530- Therapeutic activity, 97112- Neuromuscular re-education, 97535- Self Care,  97140- Manual therapy, 7113- Aquatic Therapy, 97014- Electrical stimulation (unattended), 97035- Ultrasound, Patient/Family education, Taping, Dry Needling, DME instructions, Cryotherapy, and Moist heat   PLAN FOR NEXT SESSION:  Cont per Dr. Dub Mikes Reverse  total shoulder protocol ( will be on Dave's desk )    Lenore Manner, PTA 05/14/2023, 1:15 PM

## 2023-05-15 ENCOUNTER — Encounter: Payer: Self-pay | Admitting: Cardiovascular Disease

## 2023-05-16 ENCOUNTER — Encounter: Payer: Self-pay | Admitting: Family

## 2023-05-16 ENCOUNTER — Ambulatory Visit: Payer: Commercial Managed Care - PPO | Admitting: Family

## 2023-05-16 VITALS — BP 124/78 | HR 56 | Temp 96.7°F | Resp 18 | Ht 70.0 in | Wt 143.6 lb

## 2023-05-16 DIAGNOSIS — I2510 Atherosclerotic heart disease of native coronary artery without angina pectoris: Secondary | ICD-10-CM | POA: Diagnosis not present

## 2023-05-16 DIAGNOSIS — M810 Age-related osteoporosis without current pathological fracture: Secondary | ICD-10-CM

## 2023-05-16 DIAGNOSIS — E7800 Pure hypercholesterolemia, unspecified: Secondary | ICD-10-CM | POA: Diagnosis not present

## 2023-05-16 DIAGNOSIS — R7989 Other specified abnormal findings of blood chemistry: Secondary | ICD-10-CM | POA: Diagnosis not present

## 2023-05-16 DIAGNOSIS — M2550 Pain in unspecified joint: Secondary | ICD-10-CM | POA: Diagnosis not present

## 2023-05-16 NOTE — Progress Notes (Signed)
 Provider: Richarda Blade FNP-C   Kieran Nachtigal, Donalee Citrin, NP  Patient Care Team: Dylan Ruotolo, Donalee Citrin, NP as PCP - General (Family Medicine) Kathleene Hazel, MD as PCP - Cardiology (Cardiology) Linna Darner, RD as Dietitian (Family Medicine) Antony Contras, MD as Consulting Physician (Ophthalmology) Adornetto, Billy Fischer, DMD (Dentistry) Linna Darner, RD as Dietitian [facility] Medicine)  Extended Emergency Contact Information Primary Emergency Contact: [facility] Address: [address] [place], [place] [address] [place] Home Phone: [phone] Work Phone: [phone] Mobile Phone: [phone] Relation: Spouse  Code Status:  Full Code  Goals of care: Advanced Directive information    05/16/2023    9:22 AM  Advanced Directives  Does Patient Have a Medical Advance Directive? Yes  Type of Advance Directive Living will  Does patient want to make changes to medical advance directive? No - Patient declined     Chief Complaint  Patient presents with   Annual Exam    Physical/needs to discuss Covid and Pneumonia vaccine       Discussed the use of AI scribe software for clinical note transcription with the patient, who gave verbal consent to proceed.  History of Present Illness   Tara [name] is a 63 year old female who presents for an annual physical exam.  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  She has been on Prolia for osteoporosis for three years with improvement in bone density. Her calcium level was previously 8.9. She takes multivitamins, vitamin D, calcium, fish oil, aspirin, and Crestor. She reports fluctuating hepatic enzymes without explanation and is concerned about this.  She has a history of degenerative disc disease and is undergoing outpatient rehabilitation therapy. She saw Dr. Katrinka Blazing recently for this condition and is scheduled for an  echocardiogram, which was rescheduled due to difficulty lying in the required position. She experiences difficulty breathing with activity but has no chest pain.  She experiences significant sleep disturbances, sleeping only two to three hours before waking. She uses a mindful cocoa mix with melatonin to aid sleep but finds melatonin alone makes her more awake. She denies depression but describes her mind as 'everywhere' when she wakes up.  She experiences ankle swelling, particularly at the end of the day, and uses compression stockings. She denies using salt in her diet and prepares her own meals. She uses psyllium for bowel regularity due to internal hemorrhoids.  She is physically active, having resumed walking and jogging, and incorporates light weight training into her routine, using small weights three days a week.   Past Medical History:  Diagnosis Date   Allergy    Anemia    Arthritis    Hyperlipidemia    Insomnia    Mitral valve prolapse    PONV (postoperative nausea and vomiting)    Past Surgical History:  Procedure Laterality Date   AUGMENTATION MAMMAPLASTY Bilateral    Dec 2019   BREAST BIOPSY Right    axillary node benign   EYE SURGERY  2010   Lasik for distance correction; use one contact in left eye for reading now   JOINT REPLACEMENT  2021; 2022; 2025   reverse shoulder on the right; anatomic replacement on the left then conversion to reverse replacement due to failed anatomic   KNEE SURGERY  1988   right   SHOULDER ARTHROSCOPY WITH LABRAL REPAIR Right  08/28/2012   Procedure: RIGHT SHOULDER ARTHROSCOPY WITH LABRAL DEBRIDEMENT, ASPIRATION AND DECOMPRESSION OF PARALABRAL CYST;  Surgeon: Senaida Lange, MD;  Location: [facility] OR;  Service: Orthopedics;  Laterality: Right;   TOTAL SHOULDER ARTHROPLASTY Right 03/24/2020   Procedure: RIGHT REVERSE SHOULDER ARTHROPLASTY;  Surgeon: Francena Hanly, MD;  Location: [facility] ORS;  Service: Orthopedics;  Laterality: Right;    TOTAL  SHOULDER ARTHROPLASTY Left 02/23/2021   Procedure: TOTAL SHOULDER ARTHROPLASTY;  Surgeon: Francena Hanly, MD;  Location: [facility];  Service: Orthopedics;  Laterality: Left;   TOTAL SHOULDER REVISION Left 02/07/2023   Procedure: Excision Left shoulder nodule; Conversion of left anatomic shoulder arthroplasty to reverse shoulder arthroplasty;  Surgeon: Francena Hanly, MD;  Location: [facility];  Service: Orthopedics;  Laterality: Left;    Allergies  Allergen Reactions   Codeine Nausea And Vomiting   Hydrocodone Nausea And Vomiting   Triple Antibiotic Pain Relief [Neomy-Bacit-Polymyx-Pramoxine] Other (See Comments)    Burned skin   Triple Antibiotic W/Hydrocortisone  [Bacitra-Neomycin-Polymyxin-Hc]     Burned skin    Contrast Media [Iodinated Contrast Media] Hives    After CT scan with contrast, pt had MRI without contrast. Pt informed MRI Tech that she had a rash on her legs. MRI Tech spoke with Radiologist after MRI was completed. Per Rad, since patient was here x 30 minutes after CT contrast and was doing fine, with rash fading, pt was allowed to leave facility. Alert and oriented. jmh    Allergies as of 05/16/2023       Reactions   Codeine Nausea And Vomiting   Hydrocodone Nausea And Vomiting   Triple Antibiotic Pain Relief [neomy-bacit-polymyx-pramoxine] Other (See Comments)   Burned skin   Triple Antibiotic W/hydrocortisone  [bacitra-neomycin-polymyxin-hc]    Burned skin    Contrast Media [iodinated Contrast Media] Hives   After CT scan with contrast, pt had MRI without contrast. Pt informed MRI Tech that she had a rash on her legs. MRI Tech spoke with Radiologist after MRI was completed. Per Rad, since patient was here x 30 minutes after CT contrast and was doing fine, with rash fading, pt was allowed to leave facility. Alert and oriented. jmh        Medication List        Accurate as of May 16, 2023  9:53 AM. If you have any questions, ask your nurse or doctor.          STOP  taking these medications    benzonatate 100 MG capsule Commonly known as: Lawyer Stopped by: Donalee Citrin Vasti Yagi   naproxen 500 MG tablet Commonly known as: Naprosyn Stopped by: Donalee Citrin Maddux First   ondansetron 4 MG disintegrating tablet Commonly known as: ZOFRAN-ODT Stopped by: Donalee Citrin Kierston Plasencia   terbinafine 250 MG tablet Commonly known as: LAMISIL Stopped by: Donalee Citrin Simon Aaberg   traMADol 50 MG tablet Commonly known as: ULTRAM Stopped by: Donalee Citrin Bentley Haralson       TAKE these medications    AMBULATORY NON FORMULARY MEDICATION Medication Name: Mindful Evening Cocoa Mix (Melatonin 3 mg, Theanine 5 mg, GABA 100 mg) What changed:  how much to take how to take this when to take this   aspirin EC 81 MG tablet Take 1 tablet (81 mg total) by mouth daily. Swallow whole.   Calcium 600 600 MG Tabs tablet Generic drug: calcium carbonate Take 600-1,200 mg by mouth daily as needed (calcium deficiency). Based on diet intake to achieve 1200 mg daily   denosumab 60 MG/ML  Sosy injection Commonly known as: PROLIA Inject 60 mg into the skin every 6 (six) months.   Efinaconazole 10 % Soln Apply 1 drop topically daily. What changed:  when to take this reasons to take this   Fish Oil 1200 MG Caps Take 1,200 mg by mouth daily. Provides 600 mg Omega-3 fatty acids   hydroquinone 4 % cream Apply 1 Application topically to affected areas 2 (two) times daily. What changed: when to take this   MAGNESIUM GLYCINATE PO Take 500 mg by mouth daily. Chelated, supplies 50 mg of magnesium   multivitamin with minerals Tabs tablet Take 1 tablet by mouth daily.   Psyllium Husk Powd Take 1 Dose by mouth daily. 1 dose = 1 to 2 tablespoons   rosuvastatin 5 MG tablet Commonly known as: CRESTOR Take 1 tablet (5 mg total) by mouth daily.   tretinoin 0.05 % cream Commonly known as: RETIN-A Apply topically at bedtime. What changed: how much to take   Vitamin D 50 MCG (2000 UT) tablet Take  2,000 Units by mouth daily.        Review of Systems  Constitutional:  Negative for appetite change, chills, fatigue, fever and unexpected weight change.  HENT:  Negative for congestion, dental problem, ear discharge, ear pain, facial swelling, hearing loss, nosebleeds, postnasal drip, rhinorrhea, sinus pressure, sinus pain, sneezing, sore throat, tinnitus and trouble swallowing.   Eyes:  Negative for pain, discharge, redness, itching and visual disturbance.  Respiratory:  Negative for cough, chest tightness, shortness of breath and wheezing.   Cardiovascular:  Negative for chest pain, palpitations and leg swelling.  Gastrointestinal:  Negative for abdominal distention, abdominal pain, blood in stool, constipation, diarrhea, nausea and vomiting.  Endocrine: Negative for cold intolerance, heat intolerance, polydipsia, polyphagia and polyuria.  Genitourinary:  Negative for difficulty urinating, dysuria, flank pain, frequency and urgency.  Musculoskeletal:  Negative for arthralgias, back pain, gait problem, joint swelling, myalgias, neck pain and neck stiffness.  Skin:  Negative for color change, pallor, rash and wound.  Neurological:  Negative for dizziness, syncope, speech difficulty, weakness, light-headedness, numbness and headaches.  Hematological:  Does not bruise/bleed easily.  Psychiatric/Behavioral:  Negative for agitation, behavioral problems, confusion, hallucinations, self-injury, sleep disturbance and suicidal ideas. The patient is not nervous/anxious.     Immunization History  Administered Date(s) Administered   Influenza, High Dose Seasonal PF 11/29/2021   Influenza,inj,quad, With Preservative 12/21/2019   Influenza-Unspecified 09/20/2018, 11/23/2019, 10/20/2020, 11/16/2022   PFIZER(Purple Top)SARS-COV-2 Vaccination 02/26/2019, 03/19/2019, 12/11/2019   Tdap 02/21/2015   Zoster Recombinant(Shingrix) 11/14/2017, 01/20/2018   Pertinent  Health Maintenance Due  Topic Date Due    MAMMOGRAM  10/11/2024   DEXA SCAN  04/23/2025   Colonoscopy  06/07/2032   INFLUENZA VACCINE  Completed      05/03/2021    4:59 PM 05/07/2022    9:30 AM 11/23/2022   11:49 AM 03/01/2023    9:36 AM 05/16/2023    9:22 AM  Fall Risk  Falls in the past year? 0 0 0 0 0  Was there an injury with Fall? 0 0  0 0  Fall Risk Category Calculator 0 0  0 0  Fall Risk Category (Retired) Low      (RETIRED) Patient Fall Risk Level Low fall risk      Patient at Risk for Falls Due to No Fall Risks No Fall Risks   No Fall Risks  Fall risk Follow up Falls evaluation completed       Functional Status  Survey:    Vitals:   05/16/23 0909  BP: 124/78  Pulse: (!) 56  Resp: 18  Temp: (!) 96.7 F (35.9 C)  SpO2: 99%  Weight: 143 lb 9.6 oz (65.1 kg)  Height: 5\' 10"  (1.778 m)   Body mass index is 20.6 kg/m. Physical Exam Physical Exam   GENERAL: Alert, cooperative, well developed, no acute distress. HEENT: Normocephalic, normal oropharynx, moist mucous membranes, ears normal with intact tympanic membranes, no cerumen impaction, nose normal without discharge or septal deviation, oral cavity normal, tongue without lesions, no sinus tenderness. NECK: Neck supple, no nuchal rigidity. CHEST: Clear to auscultation bilaterally, no wheezes, rhonchi, or crackles. CARDIOVASCULAR: Normal heart rate and rhythm, S1 and S2 normal without murmurs. ABDOMEN: Soft, non-tender, non-distended, without organomegaly, normal bowel sounds. EXTREMITIES: No cyanosis, edema, or knee swelling. NEUROLOGICAL: Cranial nerves grossly intact, moves all extremities without gross motor or sensory deficit. SKIN: No rash, No Lesion or erythema  PSYCHIATRY/BEHAVIORAL: Mood stable   Labs reviewed: Recent Labs    12/31/22 1533 02/01/23 1409 03/01/23 1030  NA  --  132* 137  K  --  3.7 4.3  CL  --  99 100  CO2  --  27 29  GLUCOSE  --  86 98  BUN  --  26* 18  CREATININE  --  0.56 0.53  CALCIUM 9.4 9.0 8.9  PHOS 3.3  --   --     Recent Labs    03/01/23 1030  AST 25  ALT 21  BILITOT 0.4  PROT 6.2   Recent Labs    02/01/23 1409 03/01/23 1030  WBC 5.1 10.2  NEUTROABS  --  7,783  HGB 13.9 12.9  HCT 39.7 38.7  MCV 99.5 98.2  PLT 184 256   Lab Results  Component Value Date   TSH 1.49 12/31/2022   No results found for: "HGBA1C" Lab Results  Component Value Date   CHOL 154 05/07/2022   HDL 65 05/07/2022   LDLCALC 77 05/07/2022   TRIG 50 05/07/2022   CHOLHDL 2.4 05/07/2022    Significant Diagnostic Results in last 30 days:  DG Bone Density Result Date: 04/24/2023 EXAM: DUAL X-RAY ABSORPTIOMETRY (DXA) FOR BONE MINERAL DENSITY IMPRESSION: Referring Physician:  Orland Penman Your patient completed a bone mineral density test using GE Lunar iDXA system (analysis version: 16). Technologist: KAT PATIENT: Name: Jaquetta, Currier Patient ID: [id number] Birth Date: 12/16/1960 Height: 69.5 in. Sex: Female Measured: 04/24/2023 Weight: 143.4 lbs. Indications: Caucasian, Estrogen Deficient, Height Loss (781.91), History of Osteoporosis, Hypercalcemia, osteoarthritis, Postmenopausal Fractures: None Treatments: Calcium (E943.0), Prolia, Vitamin D (E933.5) ASSESSMENT: The BMD measured at Forearm Radius 33% is 0.620 g/cm2 with a T-score of -3.0. This patient is considered osteoporotic according to World Health Organization [facility]) criteria. The quality of the exam is good. L 3, L4 were excluded due to degenerative changes. Site Region Measured Date Measured Age YA BMD Significant CHANGE T-score Left Forearm Radius 33% 04/24/2023 61.6 -3.0 0.620 g/cm2 AP Spine L1-L2 04/24/2023 61.6 -2.8 0.823 g/cm2 * AP Spine L1-L2 10/05/2020 59.1 -3.4 0.756 g/cm2 DualFemur Neck Left 04/24/2023 61.6 -1.9 0.773 g/cm2 * DualFemur Neck Left 10/05/2020 59.1 -2.3 0.716 g/cm2 * DualFemur Total Mean 04/24/2023 61.6 -1.5 0.814 g/cm2 * DualFemur Total Mean 10/05/2020 59.1 -2.0 0.754 g/cm2 * World Health Organization [facility]) criteria for  post-menopausal, Caucasian Women: Normal       T-score at or above -1 SD Osteopenia   T-score between -1 and -2.5 SD Osteoporosis T-score at  or below -2.5 SD RECOMMENDATION: 1. All patients should optimize calcium and vitamin D intake. 2. Consider FDA-approved medical therapies in postmenopausal women and men aged 57 years and older, based on the following: a. A hip or vertebral (clinical or morphometric) fracture. b. T-score = -2.5 at the femoral neck or spine after appropriate evaluation to exclude secondary causes. c. Low bone mass (T-score between -1.0 and -2.5 at the femoral neck or spine) and a 10-year probability of a hip fracture = 3% or a 10-year probability of a major osteoporosis-related fracture = 20% based on the [place]-adapted WHO algorithm. d. Clinician judgment and/or patient preferences may indicate treatment for people with 10-year fracture probabilities above or below these levels. FOLLOW-UP: Patients with diagnosis of osteoporosis or at high risk for fracture should have regular bone mineral density tests.? Patients eligible for Medicare are allowed routine testing every 2 years.? The testing frequency can be increased to one year for patients who have rapidly progressing disease, are receiving or discontinuing medical therapy to restore bone mass, or have additional risk factors. I have reviewed this study and agree with the findings. [facility] Electronically Signed   By: Baird Lyons M.D.   On: 04/24/2023 12:00    Assessment/Plan  Sleep Disturbance Experiences significant sleep disruption, achieving only 2-3 hours of sleep before waking. Uses a mindful cocoa mix with melatonin, which aids initial sleep but not duration. Melatonin alone exacerbates wakefulness. No daytime naps reported. - Consider alternative sleep aids or strategies to improve sleep duration  CAD Chest pain free  - continue on ASA and Rosuvastatin  - dietary modification and exercise advised    Hypercholesteremia  Dietary and lifestyle modification as above  - continue on Rosuvastatin   Degenerative Disc Disease Undergoing outpatient rehabilitation. Echocardiogram was rescheduled due to difficulty lying down, but she is now able to proceed with it. - Complete echocardiogram as scheduled - Continue outpatient rehabilitation  Osteoporosis In the third year of Prolia treatment with improved bone density. Regular calcium level monitoring is necessary due to Prolia use. Engages in weight-bearing exercises to support bone health. - Recheck lab work to monitor calcium levels - Continue Prolia treatment every six months - Encourage weight-bearing exercises to support bone health  General Health Maintenance Annual physical examination conducted. Prefers to wait until age 74 for Prevnar 20 vaccination. Tetanus vaccine is up to date, last administered in January 2017. Bone density scan shows improvement, likely due to Prolia treatment. Regular screenings for cholesterol and thyroid function are within normal limits. Takes multivitamins, vitamin D, calcium, fish oil, and aspirin. No alcohol consumption. Regular dental visits twice a year. Engages in regular exercise, including walking, jogging, and light weight training. - Wait until age 50 for Prevnar 20 vaccination - Continue regular screenings for cholesterol and thyroid function - Maintain regular exercise routine - Continue regular dental visits  Follow-up To follow up for lab work and annual physical. Doctor Katrinka Blazing to review lab results. - Complete lab work as ordered - Schedule one-year follow-up appointment for physical - Ensure Doctor Katrinka Blazing reviews lab results   Family/ staff Communication: Reviewed plan of care with patient verbalized understanding   Labs/tests ordered:  - CBC with Differential/Platelet - CMP with eGFR([facility]) - TSH - Lipid panel - CEA  - Sed Rate   Next Appointment : Return in about 1 year (around  05/15/2024) for annual Physical examination.   Spent 30 minutes of Face to face and non-face to face with patient  >50%  time spent counseling; reviewing medical record; tests; labs; documentation and developing future plan of care.   Caesar Bookman, NP

## 2023-05-17 ENCOUNTER — Encounter (HOSPITAL_BASED_OUTPATIENT_CLINIC_OR_DEPARTMENT_OTHER): Payer: 59 | Admitting: Physical Therapy

## 2023-05-17 ENCOUNTER — Encounter: Payer: Self-pay | Admitting: Family Medicine

## 2023-05-17 LAB — COMPLETE METABOLIC PANEL WITHOUT GFR
AG Ratio: 1.6 (calc) (ref 1.0–2.5)
ALT: 19 U/L (ref 6–29)
AST: 27 U/L (ref 10–35)
Albumin: 4.4 g/dL (ref 3.6–5.1)
Alkaline phosphatase (APISO): 47 U/L (ref 37–153)
BUN: 16 mg/dL (ref 7–25)
CO2: 29 mmol/L (ref 20–32)
Calcium: 9.3 mg/dL (ref 8.6–10.4)
Chloride: 102 mmol/L (ref 98–110)
Creat: 0.5 mg/dL (ref 0.50–1.05)
Globulin: 2.7 g/dL (ref 1.9–3.7)
Glucose, Bld: 79 mg/dL (ref 65–99)
Potassium: 4.3 mmol/L (ref 3.5–5.3)
Sodium: 139 mmol/L (ref 135–146)
Total Bilirubin: 0.5 mg/dL (ref 0.2–1.2)
Total Protein: 7.1 g/dL (ref 6.1–8.1)

## 2023-05-17 LAB — LIPID PANEL
Cholesterol: 156 mg/dL (ref <200)
HDL: 66 mg/dL (ref >=50)
LDL Cholesterol (Calc): 77 mg/dL
Non-HDL Cholesterol (Calc): 90 mg/dL (ref <130)
Total CHOL/HDL Ratio: 2.4 (calc) (ref <5.0)
Triglycerides: 45 mg/dL (ref <150)

## 2023-05-17 LAB — CBC WITH DIFFERENTIAL/PLATELET
Absolute Lymphocytes: 1133 {cells}/uL (ref 850–3900)
Absolute Monocytes: 395 {cells}/uL (ref 200–950)
Basophils Absolute: 42 {cells}/uL (ref 0–200)
Basophils Relative: 0.9 %
Eosinophils Absolute: 103 {cells}/uL (ref 15–500)
Eosinophils Relative: 2.2 %
HCT: 43.9 % (ref 35.0–45.0)
Hemoglobin: 14.9 g/dL (ref 11.7–15.5)
MCH: 32.8 pg (ref 27.0–33.0)
MCHC: 33.9 g/dL (ref 32.0–36.0)
MCV: 96.7 fL (ref 80.0–100.0)
MPV: 10.7 fL (ref 7.5–12.5)
Monocytes Relative: 8.4 %
Neutro Abs: 3027 {cells}/uL (ref 1500–7800)
Neutrophils Relative %: 64.4 %
Platelets: 197 10*3/uL (ref 140–400)
RBC: 4.54 10*6/uL (ref 3.80–5.10)
RDW: 11.8 % (ref 11.0–15.0)
Total Lymphocyte: 24.1 %
WBC: 4.7 10*3/uL (ref 3.8–10.8)

## 2023-05-17 LAB — CEA: CEA: 4.9 ng/mL — ABNORMAL HIGH

## 2023-05-17 LAB — SEDIMENTATION RATE: Sed Rate: 6 mm/h (ref 0–30)

## 2023-05-17 LAB — TSH: TSH: 1.38 m[IU]/L (ref 0.40–4.50)

## 2023-05-18 ENCOUNTER — Encounter (HOSPITAL_BASED_OUTPATIENT_CLINIC_OR_DEPARTMENT_OTHER): Payer: Self-pay | Admitting: Physical Therapy

## 2023-05-18 ENCOUNTER — Ambulatory Visit (HOSPITAL_BASED_OUTPATIENT_CLINIC_OR_DEPARTMENT_OTHER): Payer: 59 | Admitting: Physical Therapy

## 2023-05-18 DIAGNOSIS — M25512 Pain in left shoulder: Secondary | ICD-10-CM | POA: Diagnosis not present

## 2023-05-18 DIAGNOSIS — M25612 Stiffness of left shoulder, not elsewhere classified: Secondary | ICD-10-CM | POA: Diagnosis not present

## 2023-05-18 NOTE — Therapy (Signed)
 OUTPATIENT PHYSICAL THERAPY UPPER EXTREMITY TREATMENT   Patient Name: Tara [name] MRN: [id number] DOB:05-30-1960, 63 y.o., female Today's Date: 05/18/2023  END OF SESSION:  PT End of Session - 05/18/23 1045     Visit Number 21    Number of Visits 39    Date for PT Re-Evaluation 07/20/23    PT Start Time 1044    PT Stop Time 1125    PT Time Calculation (min) 41 min    Activity Tolerance Patient tolerated treatment well    Behavior During Therapy [facility] for tasks assessed/performed                    Past Medical History:  Diagnosis Date   Allergy    Anemia    Arthritis    Hyperlipidemia    Insomnia    Mitral valve prolapse    PONV (postoperative nausea and vomiting)    Past Surgical History:  Procedure Laterality Date   AUGMENTATION MAMMAPLASTY Bilateral    Dec 2019   BREAST BIOPSY Right    axillary node benign   EYE SURGERY  2010   Lasik for distance correction; use one contact in left eye for reading now   JOINT REPLACEMENT  2021; 2022; 2025   reverse shoulder on the right; anatomic replacement on the left then conversion to reverse replacement due to failed anatomic   KNEE SURGERY  1988   right   SHOULDER ARTHROSCOPY WITH LABRAL REPAIR Right 08/28/2012   Procedure: RIGHT SHOULDER ARTHROSCOPY WITH LABRAL DEBRIDEMENT, ASPIRATION AND DECOMPRESSION OF PARALABRAL CYST;  Surgeon: Senaida Lange, MD;  Location: [facility] OR;  Service: Orthopedics;  Laterality: Right;   TOTAL SHOULDER ARTHROPLASTY Right 03/24/2020   Procedure: RIGHT REVERSE SHOULDER ARTHROPLASTY;  Surgeon: Francena Hanly, MD;  Location: [facility];  Service: Orthopedics;  Laterality: Right;    TOTAL SHOULDER ARTHROPLASTY Left 02/23/2021   Procedure: TOTAL SHOULDER ARTHROPLASTY;  Surgeon: Francena Hanly, MD;  Location: [facility];  Service: Orthopedics;  Laterality: Left;   TOTAL SHOULDER REVISION Left 02/07/2023   Procedure: Excision Left shoulder nodule; Conversion of left anatomic shoulder  arthroplasty to reverse shoulder arthroplasty;  Surgeon: Francena Hanly, MD;  Location: [facility];  Service: Orthopedics;  Laterality: Left;   Patient Active Problem List   Diagnosis Date Noted   Onychomycosis 05/10/2023   Hypercalciuria 02/02/2023   DDD (degenerative disc disease), cervical 04/13/2021   Osteoporosis without current pathological fracture 10/28/2020   Nonallopathic lesion of sacral region 02/26/2020   Hot flashes 02/23/2019   Other insomnia 02/23/2019   Cystic acne vulgaris 02/23/2019   Bunion of right foot 02/23/2019   Varicose 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 02/23/2019   Multiple atypical nevi 02/23/2019   Mitral valve prolapse 03/03/2009    PCP: Carilyn Goodpasture [name]   REFERRING PROVIDER: Kaylyn Layer Supple  REFERRING DIAG:  Diagnosis  [id number] (ICD-10-CM) - Presence of left artificial shoulder joint    THERAPY DIAG:  Stiffness of left shoulder, not elsewhere classified  Acute pain of left shoulder  Rationale for Evaluation and Treatment: Rehabilitation  ONSET DATE:  DOS: 02/07/2023  SUBJECTIVE:  SUBJECTIVE STATEMENT: Dr supple is worried about risk of fx.    From Initial Evaluation: Patient had a left standard total shoulder replacement last year.  She did well for about 6 months then began having progressive pain in the shoulder.  She had to have a shoulder revision with a reverse total shoulder.  At this time her pain is well-controlled.  She is also had a right shoulder replaced.  When she had that replaced she had significant pain.  She reports this 1 is better.  She is wearing her sling outside the house and when sleeping.  She is otherwise has been wearing her sling.  She has increased pain when she moves her arm.  She has been compliant with her HEP so far. Hand  dominance: Right  PERTINENT HISTORY: Multijoint arthritis including knees and hips.  Osteoporosis, right shoulder replacement, mitral valve prolapse  PAIN:  Are you having pain? Yes: NPRS scale: 4/10  Pain location: Right anterior shoulder but runs down lateral arm to elbow Pain description: Aching Aggravating factors: Use of the arm Relieving factors: Not using the arm is much  PRECAUTIONS: Shoulder  RED FLAGS: None   WEIGHT BEARING RESTRICTIONS: No  FALLS:  Has patient fallen in last 6 months? No  LIVING ENVIRONMENT:   OCCUPATION: Nurse   PLOF: Independent  PATIENT GOALS:  Patient wants to be functional   NEXT MD VISIT:  2 weeks prior    OBJECTIVE:  Note: Objective measures were completed at Evaluation unless otherwise noted.  DIAGNOSTIC FINDINGS:  Nothing post op   PATIENT SURVEYS :    COGNITION: Overall cognitive status: Within functional limits for tasks assessed     SENSATION: Denies parathesias   POSTURE: Good   UPPER EXTREMITY ROM:   Passive ROM Right eval Left eval Left 1/21 L  L  Shoulder flexion  85 106 130AROM 160PROM  148AROM supine 158PROM  Shoulder extension       Shoulder abduction     112 active supine  Shoulder adduction       Shoulder internal rotation  Can sit on body comfortably   L gute  65/L iliac crest but can get to L4 with some compensation  Shoulder external rotation  20 26  50/T3  Elbow flexion       Elbow extension       Wrist flexion       Wrist extension       Wrist ulnar deviation       Wrist radial deviation       Wrist pronation       Wrist supination       (Blank rows = not tested)  UPPER EXTREMITY MMT:  MMT Right 3/25 Left 3/25  Shoulder flexion 15.4 8.2  Shoulder extension    Shoulder abduction 9.9 6.8  Shoulder adduction    Shoulder internal rotation 11.3 8.8  Shoulder external rotation 11.5 8.1  Middle trapezius    Lower trapezius    Elbow flexion    Elbow extension    Wrist flexion     Wrist extension    Wrist ulnar deviation    Wrist radial deviation    Wrist pronation    Wrist supination    Grip strength (lbs)    (Blank rows = not tested)      PALPATION:  No unexpected TTP  TREATMENT DATE:  3/29: Mini wall push up Static wall push up with row to scap retraction Standing forward press yellow tband post anchor Single arm lat press on ball  3/25 There Ex: Updated MMT and ROM Prone IYT on physioball x10ea 0#, x10ea 1# Ball rolls 4 way at wall with 2.2# ball x15ea   TherAct: Standing shoulder flexion YTB 2x10 to ~75deg Standing bil PNF D2 x10 Wiping counter with wash cloth to simulate housework  Manual: PROM L shoulder STM periscap mm bil, upper traps, levator scaps, lats   3/21 There Ex: Supine active shoulder flexion 2# L shoulder 2x10 Standing Y's bilateral in front of mirror (tactile cues for decreasing UT hike) Standing bil shoulder flexion in front of mirror  Ball rolls 4 way at wall with 2.2# ball x15ea   Manual: PROM L shoulder STM periscap mm bil, upper traps, levator scaps, lats       PATIENT EDUCATION: Education details:  HEP, exercise form, rationale of interventions, protocol restrictions and limitations, protocol ranges, and ROM findings Person educated: Patient Education method: Explanation, Demonstration, Tactile cues, Verbal cues Education comprehension: verbalized understanding, returned demonstration, verbal cues required, tactile cues required, and needs further education  HOME EXERCISE PROGRAM: Access Code: [email or web address] URL: [email or web address]   ASSESSMENT:  CLINICAL IMPRESSION: Notable scapular winging on the right side- heavy cuing for neuro reed and control of scapular motion. Limited stability on right leading to full biomechanical chain instability and tension.  Significant improvement in control and will continue to work bilaterally.    OBJECTIVE IMPAIRMENTS:  decreased ROM, decreased strength, impaired UE functional use, and pain.   ACTIVITY LIMITATIONS: carrying, lifting, sleeping, bathing, toileting, dressing, self feeding, reach over head, and hygiene/grooming  PARTICIPATION LIMITATIONS: meal prep, cleaning, laundry, driving, shopping, community activity, occupation, and yard work  PERSONAL FACTORS: 1-2 comorbidities: previous replacement on the same shoulder osteoporosis; multi joint OA   are also affecting patient's functional outcome.   REHAB POTENTIAL: Good  CLINICAL DECISION MAKING: Stable/uncomplicated  EVALUATION COMPLEXITY: Low  GOALS: Goals reviewed with patient? Yes  SHORT TERM GOALS: Target date: 04/16/2023    Patient will increase passive flexion to 115 degrees  Baseline: Goal status: MET  2.  Patient will increase left shoulder  ER to 30 degrees  Baseline:  Goal status: MET  3.  Patient will use her left arm for ADL's below 90 degrees with > 2/10 pain  Baseline:  Goal status: IN PROGRESS   LONG TERM GOALS: Target date: POC DATE    The patient will reach overhead with left arm without pain in order to perform ADL's  Baseline:  Goal status: IN PROGRESS  2.  Patient will each behind her head without pain in order to per from her ADL's  Baseline:  Goal status: IN PROGRESS  3.  Patient will reach behind her back to L3 without pain in order to perform ADL's  Baseline:  Goal status: IN PROGRESS L4 on 3/25  4. Able to demo overhead activity without scapular winging bilaterally  Status: New 5. Strength available for ADLs with weight/resistance such as washing dishes, vacuuming   Status: New 6. Feel prepared to lift grand children when I have them.   Status: New   PLAN: PT FREQUENCY: 2x/week  PT DURATION: 8 weeks  PLANNED INTERVENTIONS: 97110-Therapeutic exercises, 97530- Therapeutic activity, [id number]-  Neuromuscular re-education, 97535- Self Care, 54098- Manual therapy, 7113- Aquatic Therapy, 97014- Electrical stimulation (unattended), 97035- Ultrasound, Patient/Family education, Taping, Dry Needling, DME instructions, Cryotherapy, and Moist  heat   PLAN FOR NEXT SESSION:  Cont per Dr. Dub Mikes Reverse total shoulder protocol ( will be on Dave's desk )    Jerlene Rockers C. Alecxander Mainwaring PT, DPT 05/18/23 11:29 AM

## 2023-05-20 ENCOUNTER — Telehealth: Payer: Self-pay | Admitting: Cardiovascular Disease

## 2023-05-20 DIAGNOSIS — I340 Nonrheumatic mitral (valve) insufficiency: Secondary | ICD-10-CM

## 2023-05-20 NOTE — Telephone Encounter (Signed)
 Called pt due to her being on the wait list for Dr. Clifton James, and him having availability today.   Patient states she is needing her echo prior to the appointment, but the order is no longer active to schedule.   Please advise.

## 2023-05-21 NOTE — Telephone Encounter (Signed)
 The order will not let me reinstate to active request in order to schedule.  Please advise.

## 2023-05-21 NOTE — Telephone Encounter (Signed)
 New echo order placed in the system and sent back to our scheduling team to arrange this appt for the pt.   Copied below is last OV note from Dr. Clifton James, inquiring about echo needed around this time.  Will send this message back to scheduling to call the pt back to arrange her echo appt, now that the order has been reinstated.    Mitral regurgitation: Moderate MR by surface echo in January 2024. We delayed her TEE when she was last seen in May 2024 since she had just undergone esophageal dilatation. She is now feeling much better. Soft systolic murmur. Will repeat surface echo in January 2025.

## 2023-05-21 NOTE — Telephone Encounter (Signed)
 Echo scheduled for 05/02, with f/u setup for 07/14 with Dr. Clifton James.

## 2023-05-22 IMAGING — CT CT ABD-PELV W/ CM
1 of 3 series · 13 of 32 positions shown, 19 images · IV contrast (APPLIED)
Comparison: None.

CLINICAL DATA: Abdominal and pelvic pain.

EXAM:
CT ABDOMEN AND PELVIS WITH CONTRAST
TECHNIQUE: Multidetector CT imaging of the abdomen and pelvis was performed
using the standard protocol following bolus administration of
intravenous contrast.
CONTRAST:  80mL Q87EU1-XEE IOPAMIDOL (Q87EU1-XEE) INJECTION 61%

[Series 2: abd/pelvis w/cm · axial · 0.70mm/px · z∈[-478,-88]mm · 13 of 91 slices shown, 19 images]
[im 7/91  soft-tissue]
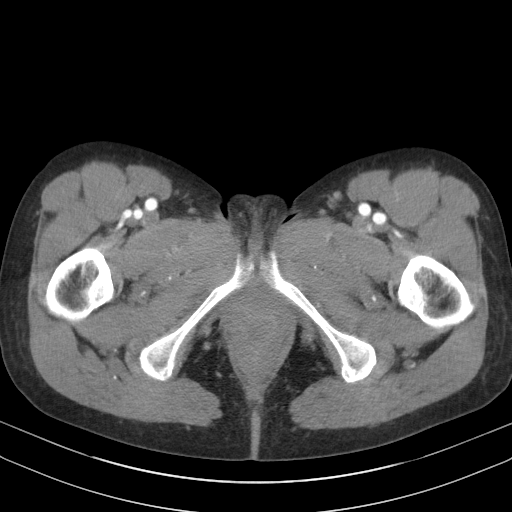
[im 7/91  bone]
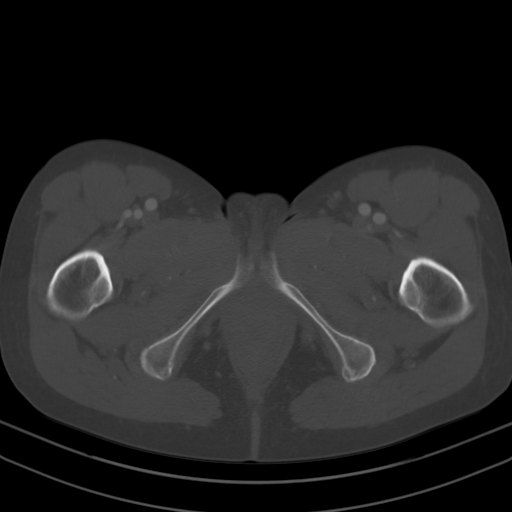
[im 13/91  soft-tissue]
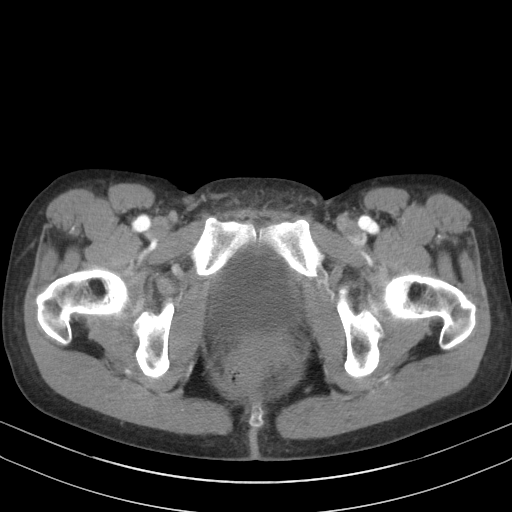
[im 19/91  soft-tissue]
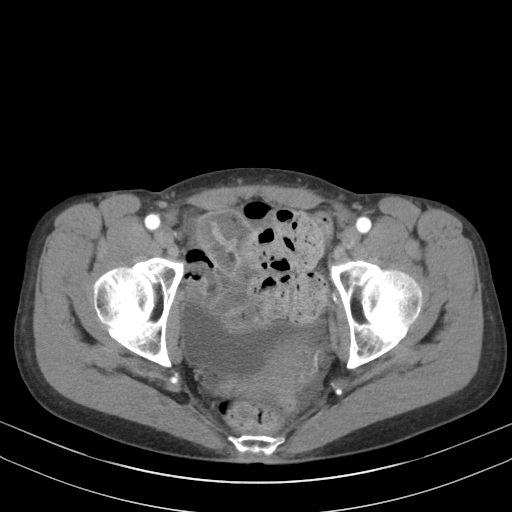
[im 25/91  soft-tissue]
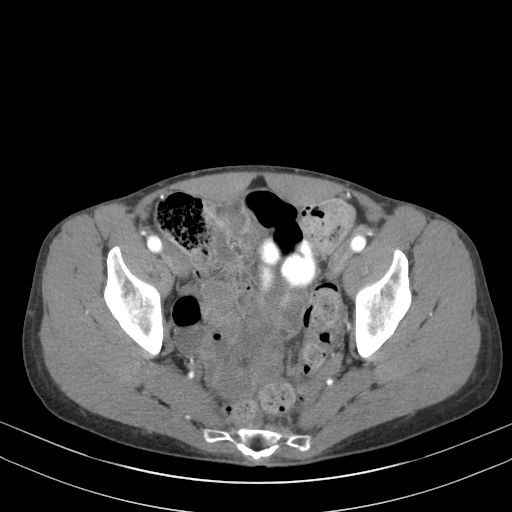
[im 31/91  soft-tissue]
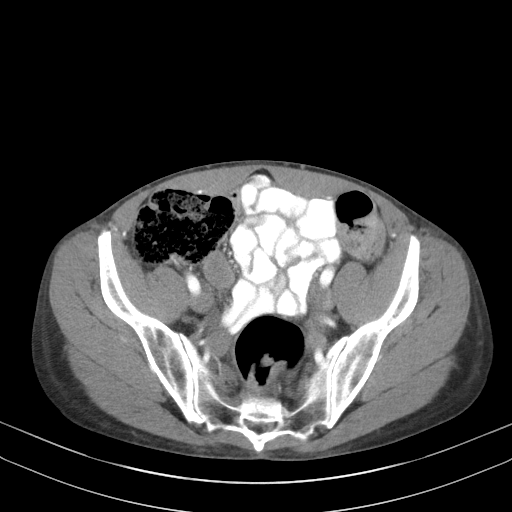
[im 37/91  soft-tissue]
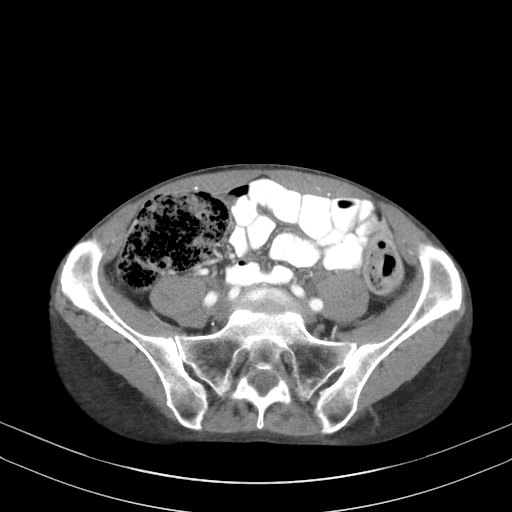
[im 49/91  soft-tissue]
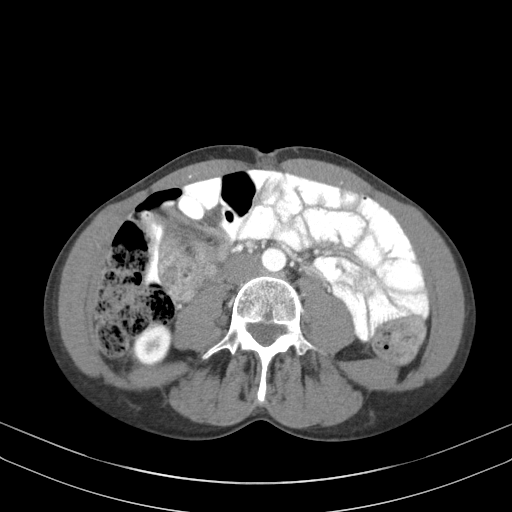
[im 55/91  soft-tissue]
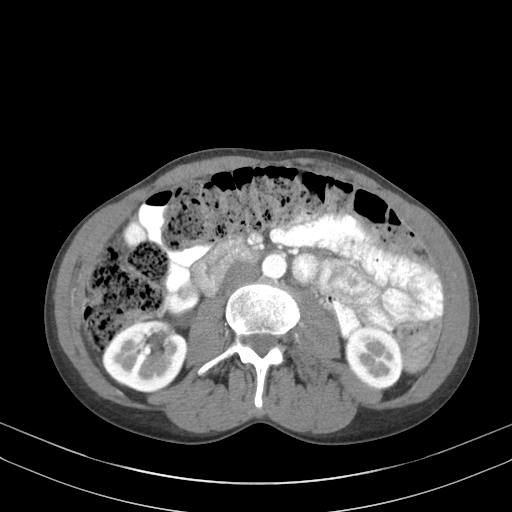
[im 61/91  soft-tissue]
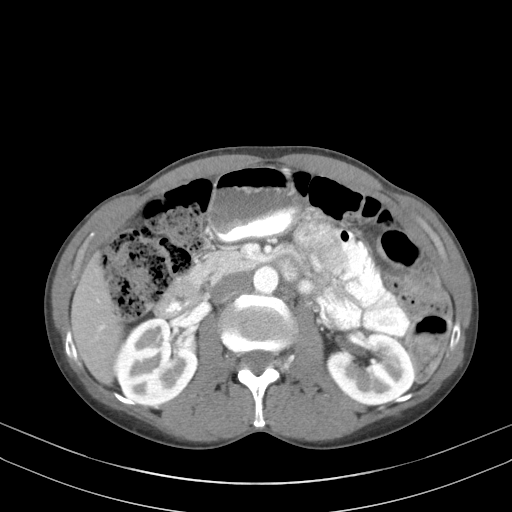
[im 61/91  bone]
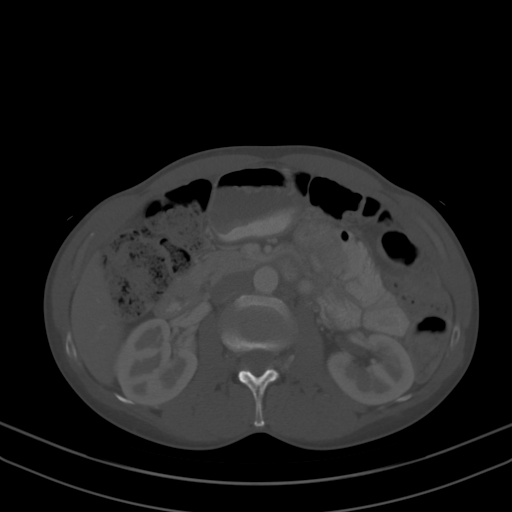
[im 67/91  soft-tissue]
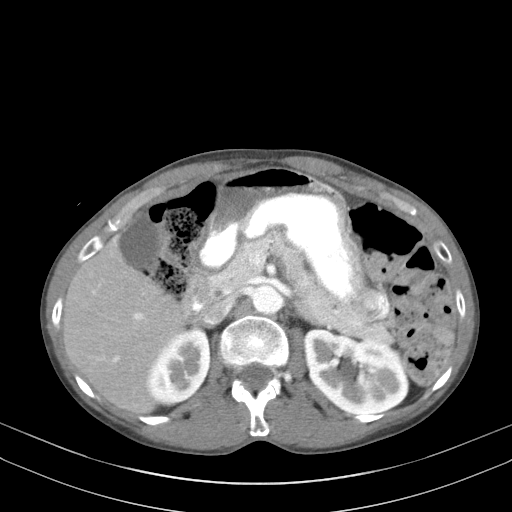
[im 67/91  lung]
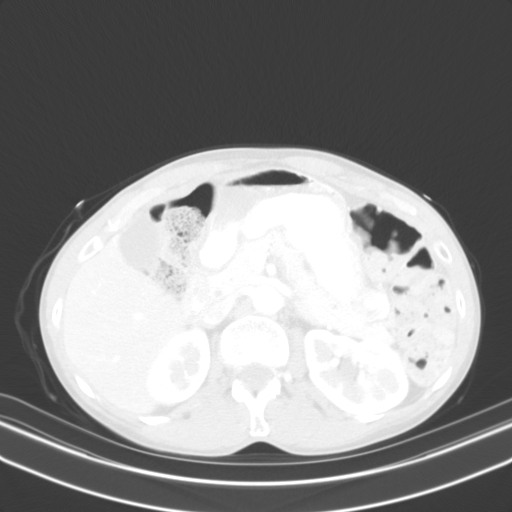
[im 73/91  soft-tissue]
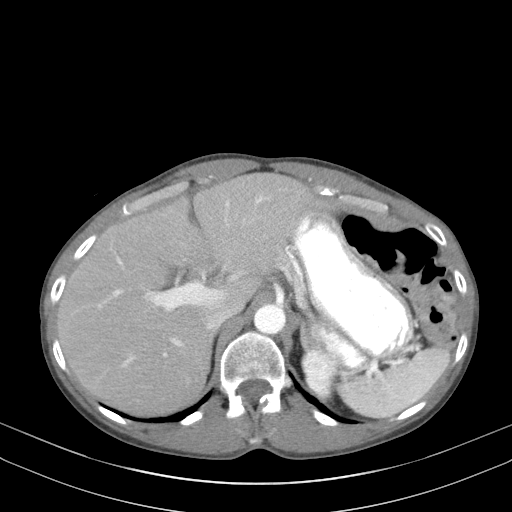
[im 73/91  lung]
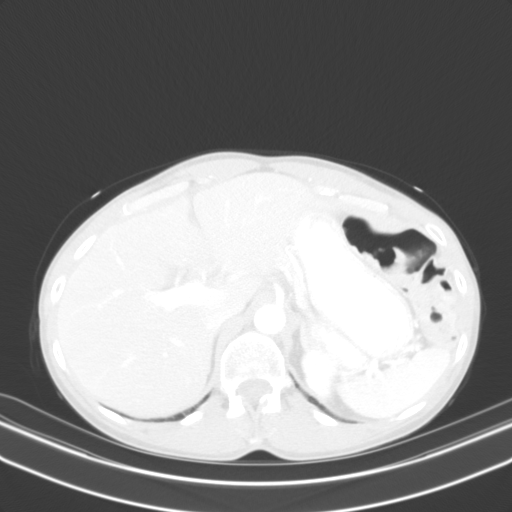
[im 79/91  soft-tissue]
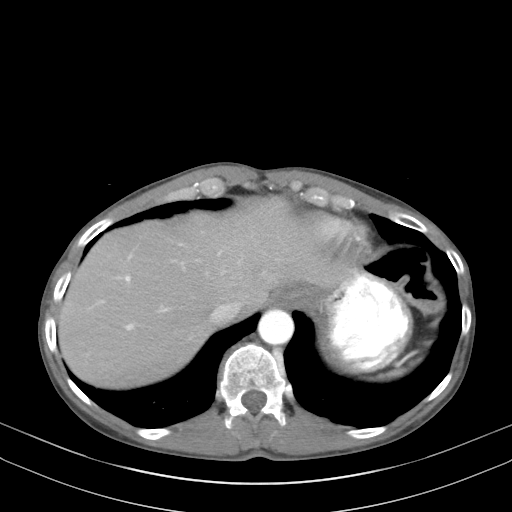
[im 79/91  lung]
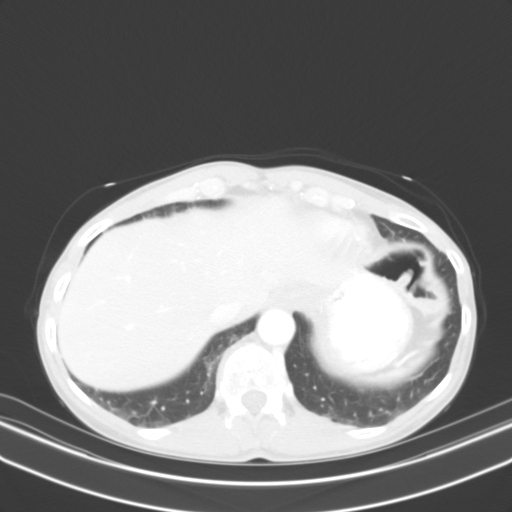
[im 85/91  soft-tissue]
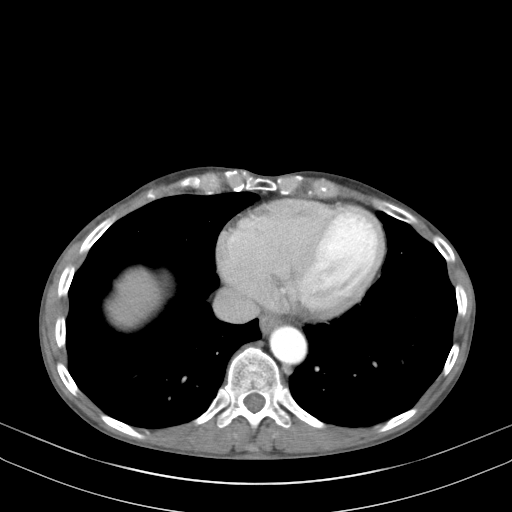
[im 85/91  lung]
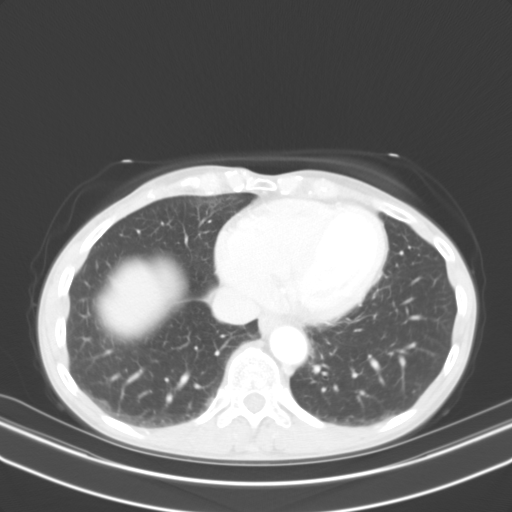

[13 of 32 positions shown; findings below may reference images not displayed]

FINDINGS: Lower chest: No acute abnormality.

Hepatobiliary: No focal liver abnormality is seen. No gallstones,
gallbladder wall thickening, or biliary dilatation.

Pancreas: Unremarkable. No pancreatic ductal dilatation or
surrounding inflammatory changes.

Spleen: Normal in size without focal abnormality.

Adrenals/Urinary Tract: Adrenal glands are unremarkable. Kidneys are
normal, without renal calculi, focal lesion, or hydronephrosis.
Bladder is unremarkable.

Stomach/Bowel: There is some admixture of ingested oral contrast
with fluid in the stomach and duodenum. Margination at the level of
the descending duodenum is mildly indistinct and some degree of
inflammation of the duodenum cannot be completely excluded. There is
no evidence of focal ulcer or perforation. Small bowel and colon
appear unremarkable without evidence of obstruction or ileus. There
is some fluid in some nondilated pelvic small bowel loops which is
nonspecific but can be associated with enteritis.

Vascular/Lymphatic: No significant vascular findings are present. No
enlarged abdominal or pelvic lymph nodes.

Reproductive: Uterus and bilateral adnexa are unremarkable.

Other: No abdominal wall hernia or abnormality. No abdominopelvic
ascites.

Musculoskeletal: No acute or significant osseous findings.
IMPRESSION: No significant acute findings. There is slight indistinct
margination of the descending duodenum and some degree of mild
inflammation cannot be completely excluded. Correlation suggested
with any symptoms suggestive of peptic disease. Some fluid in
nondilated pelvic small bowel loops is nonspecific but can be
associated with enteritis.

## 2023-05-22 IMAGING — MR MR SHOULDER*L* W/O CM
6 series · 40 of 40 positions shown · non-contrast
Comparison: None.

CLINICAL DATA: Left shoulder pain, limited range of motion,
weakness and sporadic numbness and tingling. Symptoms began after
right shoulder surgery in March 2020.

EXAM:
MRI OF THE LEFT SHOULDER WITHOUT CONTRAST
TECHNIQUE: Multiplanar, multisequence MR imaging of the shoulder was performed.
No intravenous contrast was administered.

[Series 3: T2 fat-sat · axial · 4.0mm · 0.27mm/px · z∈[-39,+81]mm · 8 of 25 slices shown (1 of 4)]
[im 1/25]
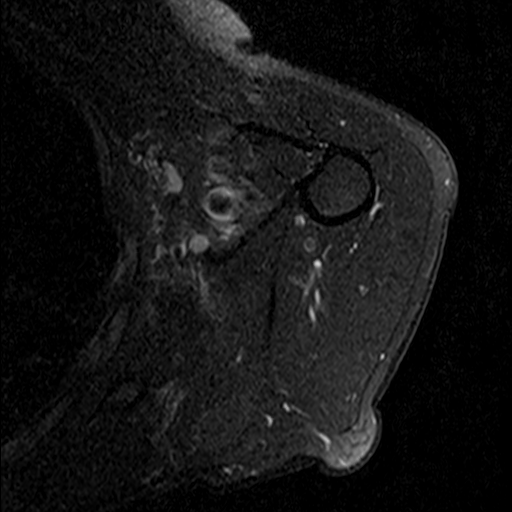
[im 4/25]
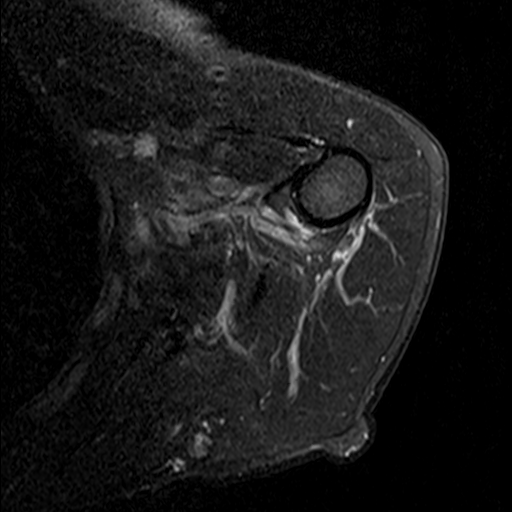
[im 7/25]
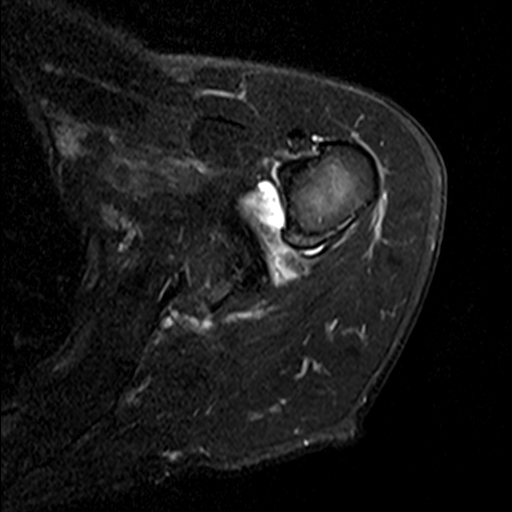
[im 11/25]
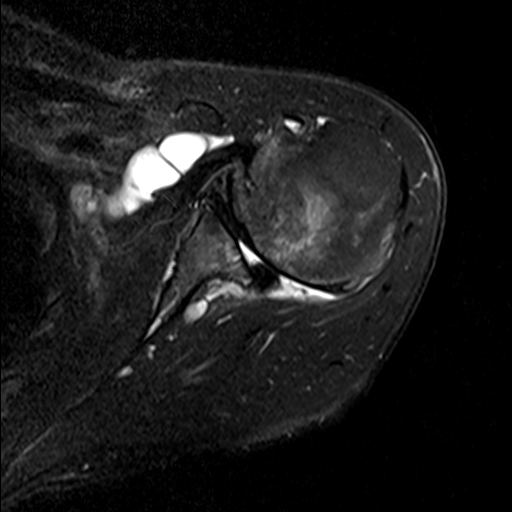
[im 14/25]
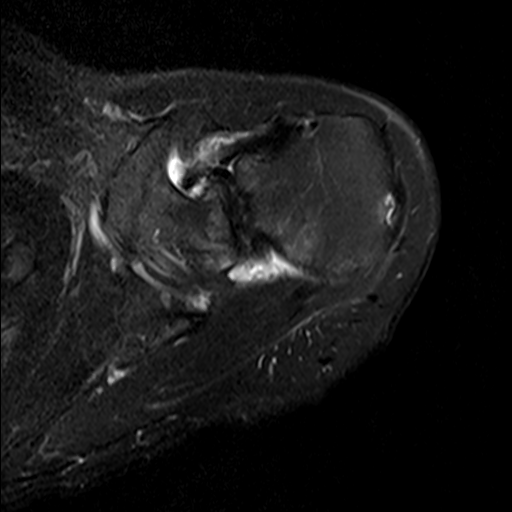
[im 18/25]
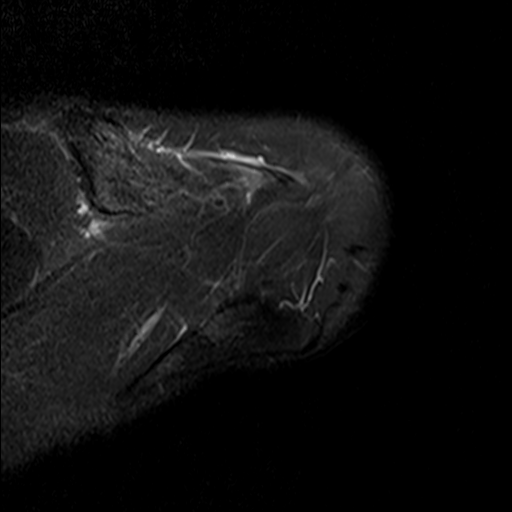
[im 21/25]
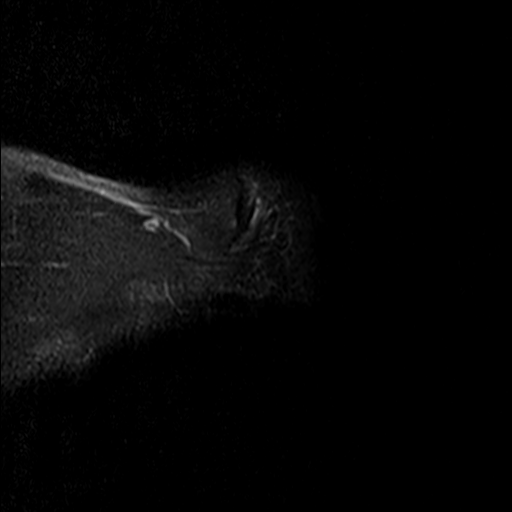
[im 25/25]
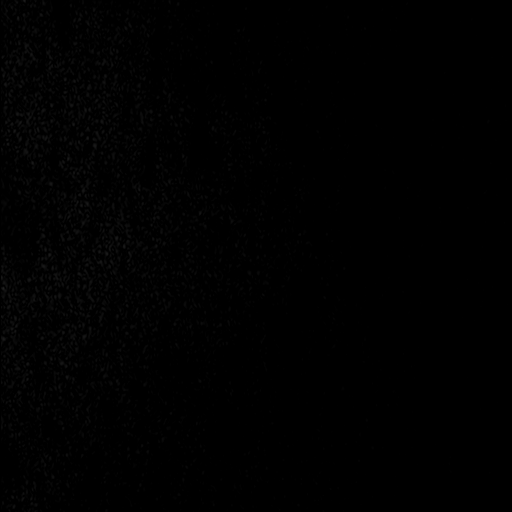

[Series 4: T2 fat-sat · oblique · 4.0mm · 0.59mm/px · 6 of 20 slices shown (2 of 4)]
[im 1/20]
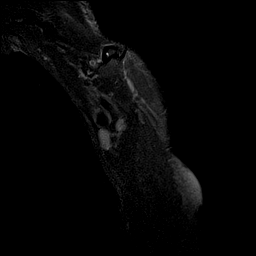
[im 4/20]
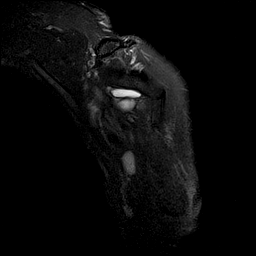
[im 8/20]
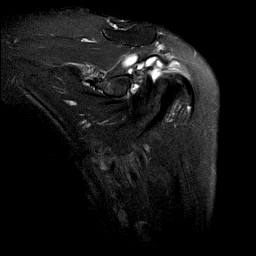
[im 12/20]
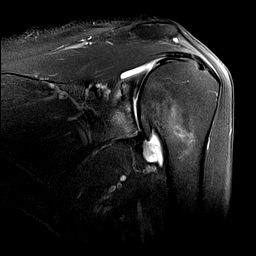
[im 16/20]
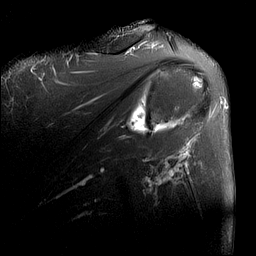
[im 20/20]
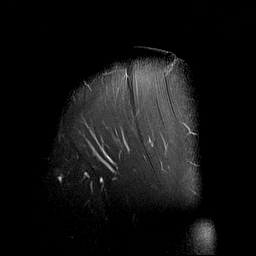

[Series 5: PD · oblique · 4.0mm · 0.59mm/px · 6 of 20 slices shown]
[im 1/20]
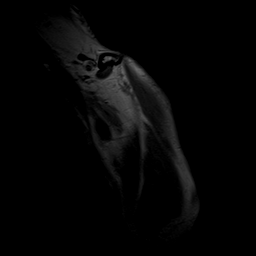
[im 4/20]
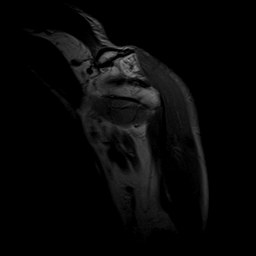
[im 8/20]
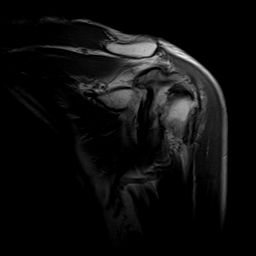
[im 12/20]
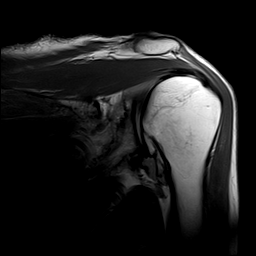
[im 16/20]
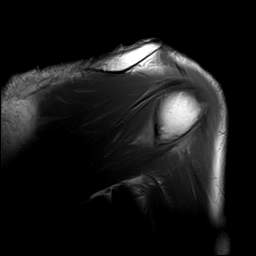
[im 20/20]
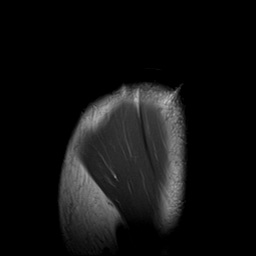

[Series 6: T2 fat-sat · oblique · 4.0mm · 0.59mm/px · 7 of 25 slices shown (3 of 4)]
[im 1/25]
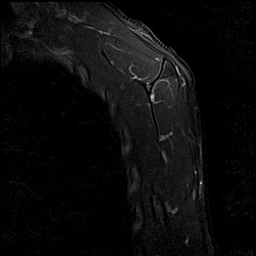
[im 5/25]
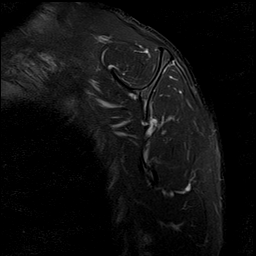
[im 9/25]
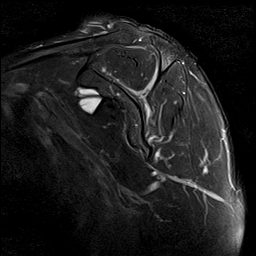
[im 13/25]
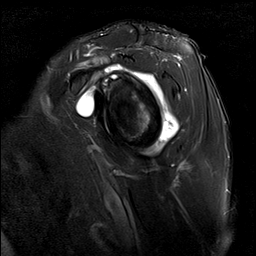
[im 17/25]
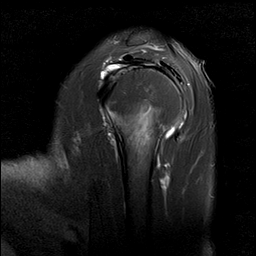
[im 21/25]
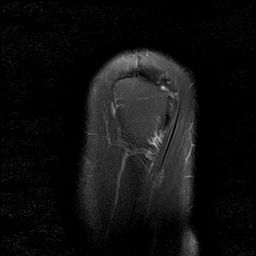
[im 25/25]
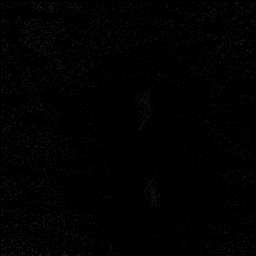

[Series 7: T1 · oblique · 4.0mm · 0.59mm/px · 7 of 25 slices shown]
[im 1/25]
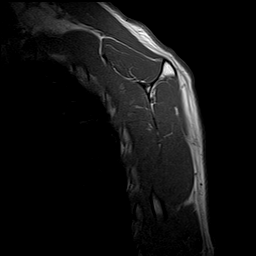
[im 5/25]
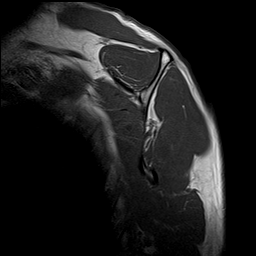
[im 9/25]
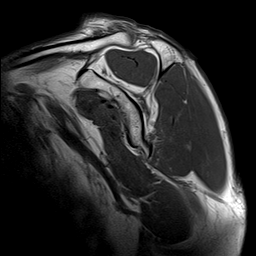
[im 13/25]
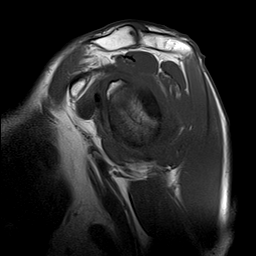
[im 17/25]
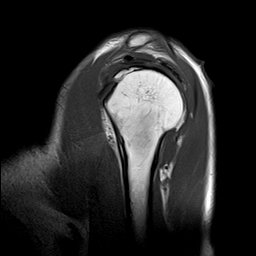
[im 21/25]
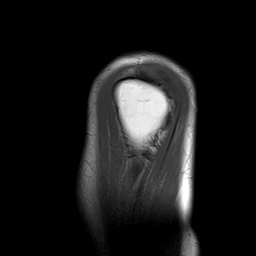
[im 25/25]
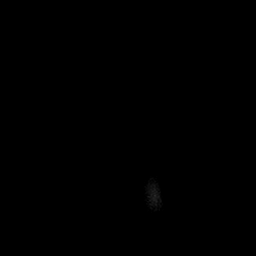

[Series 8: T2 fat-sat · oblique · 4.0mm · 0.59mm/px · 6 of 20 slices shown (4 of 4)]
[im 1/20]
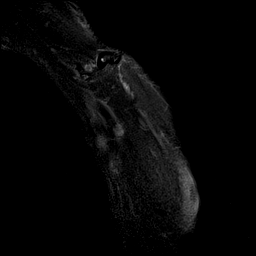
[im 4/20]
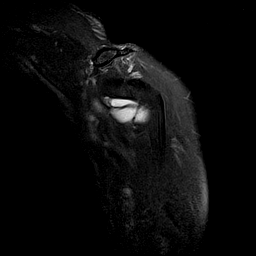
[im 8/20]
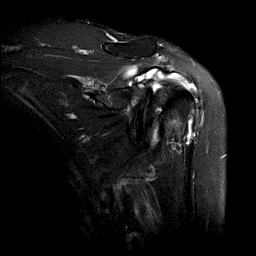
[im 12/20]
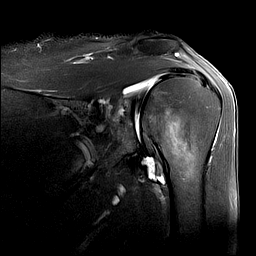
[im 16/20]
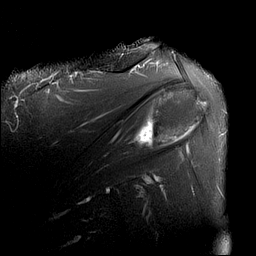
[im 20/20]
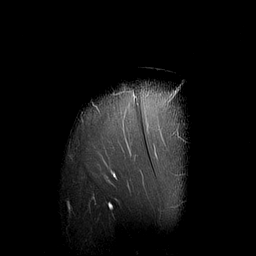

[40 of 40 positions shown; findings below may reference images not displayed]

FINDINGS: Rotator cuff:  Intact.  Mild appearing tendinopathy noted.

Muscles:  Normal.  No atrophy or focal lesion.

Biceps long head:  Intact.

Acromioclavicular Joint: Mild osteoarthritis. Type 1 acromion. No
subacromial/subdeltoid bursal fluid.

Glenohumeral Joint: The patient has severe degenerative change with
marked cartilage loss and a very large osteophyte off the humeral
head. There is some subchondral edema in the glenoid but no cyst
formation is identified. A 0.8 cm loose body is seen in the axillary
recess. Debris containing joint effusion is identified.

Labrum:  Diffusely degenerated.

Bones:  No fracture or worrisome lesion.

Other: None.
IMPRESSION: Dominant finding is severe glenohumeral osteoarthritis.

Mild appearing rotator cuff tendinopathy without tear.

Mild acromioclavicular osteoarthritis.

## 2023-05-28 ENCOUNTER — Encounter (HOSPITAL_BASED_OUTPATIENT_CLINIC_OR_DEPARTMENT_OTHER): Payer: Self-pay | Admitting: Physical Therapy

## 2023-05-28 ENCOUNTER — Ambulatory Visit (HOSPITAL_BASED_OUTPATIENT_CLINIC_OR_DEPARTMENT_OTHER): Attending: Orthopedic Surgery | Admitting: Physical Therapy

## 2023-05-28 DIAGNOSIS — M25512 Pain in left shoulder: Secondary | ICD-10-CM | POA: Insufficient documentation

## 2023-05-28 DIAGNOSIS — M25612 Stiffness of left shoulder, not elsewhere classified: Secondary | ICD-10-CM | POA: Insufficient documentation

## 2023-05-28 NOTE — Therapy (Signed)
 OUTPATIENT PHYSICAL THERAPY UPPER EXTREMITY TREATMENT   Patient Name: Tara [name] MRN: [id number] DOB:1960-06-22, 63 y.o., female Today's Date: 05/28/2023  END OF SESSION:  PT End of Session - 05/28/23 0930     Visit Number 22    Number of Visits 39    Date for PT Re-Evaluation 07/20/23    PT Start Time 0930    PT Stop Time 1014    PT Time Calculation (min) 44 min    Activity Tolerance Patient tolerated treatment well    Behavior During Therapy [facility] for tasks assessed/performed                    Past Medical History:  Diagnosis Date   Allergy    Anemia    Arthritis    Hyperlipidemia    Insomnia    Mitral valve prolapse    PONV (postoperative nausea and vomiting)    Past Surgical History:  Procedure Laterality Date   AUGMENTATION MAMMAPLASTY Bilateral    Dec 2019   BREAST BIOPSY Right    axillary node benign   EYE SURGERY  2010   Lasik for distance correction; use one contact in left eye for reading now   JOINT REPLACEMENT  2021; 2022; 2025   reverse shoulder on the right; anatomic replacement on the left then conversion to reverse replacement due to failed anatomic   KNEE SURGERY  1988   right   SHOULDER ARTHROSCOPY WITH LABRAL REPAIR Right 08/28/2012   Procedure: RIGHT SHOULDER ARTHROSCOPY WITH LABRAL DEBRIDEMENT, ASPIRATION AND DECOMPRESSION OF PARALABRAL CYST;  Surgeon: Senaida Lange, MD;  Location: [facility];  Service: Orthopedics;  Laterality: Right;   TOTAL SHOULDER ARTHROPLASTY Right 03/24/2020   Procedure: RIGHT REVERSE SHOULDER ARTHROPLASTY;  Surgeon: Francena Hanly, MD;  Location: [facility];  Service: Orthopedics;  Laterality: Right;    TOTAL SHOULDER ARTHROPLASTY Left 02/23/2021   Procedure: TOTAL SHOULDER ARTHROPLASTY;  Surgeon: Francena Hanly, MD;  Location: [facility];  Service: Orthopedics;  Laterality: Left;   TOTAL SHOULDER REVISION Left 02/07/2023   Procedure: Excision Left shoulder nodule; Conversion of left anatomic shoulder  arthroplasty to reverse shoulder arthroplasty;  Surgeon: Francena Hanly, MD;  Location: [facility];  Service: Orthopedics;  Laterality: Left;   Patient Active Problem List   Diagnosis Date Noted   Onychomycosis 05/10/2023   Hypercalciuria 02/02/2023   DDD (degenerative disc disease), cervical 04/13/2021   Osteoporosis without current pathological fracture 10/28/2020   Nonallopathic lesion of sacral region 02/26/2020   Hot flashes 02/23/2019   Other insomnia 02/23/2019   Cystic acne vulgaris 02/23/2019   Bunion of right foot 02/23/2019   Varicose 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 02/23/2019   Multiple atypical nevi 02/23/2019   Mitral valve prolapse 03/03/2009    PCP: Carilyn Goodpasture [name]   REFERRING PROVIDER: Kaylyn Layer Supple  REFERRING DIAG:  Diagnosis  [id number] (ICD-10-CM) - Presence of left artificial shoulder joint    THERAPY DIAG:  Stiffness of left shoulder, not elsewhere classified  Acute pain of left shoulder  Rationale for Evaluation and Treatment: Rehabilitation  ONSET DATE:  DOS: 02/07/2023  SUBJECTIVE:  SUBJECTIVE STATEMENT: Sore from the hips up. Trying to focus on mechanics. Denies pain after last session. 16 wk post op yesterday.    From Initial Evaluation: Patient had a left standard total shoulder replacement last year.  She did well for about 6 months then began having progressive pain in the shoulder.  She had to have a shoulder revision with a reverse total shoulder.  At this time her pain is well-controlled.  She is also had a right shoulder replaced.  When she had that replaced she had significant pain.  She reports this 1 is better.  She is wearing her sling outside the house and when sleeping.  She is otherwise has been wearing her sling.  She has increased pain when she  moves her arm.  She has been compliant with her HEP so far. Hand dominance: Right  PERTINENT HISTORY: Multijoint arthritis including knees and hips.  Osteoporosis, right shoulder replacement, mitral valve prolapse  PAIN:  Are you having pain? Yes: NPRS scale: 0/10  Pain location: hips to neck Pain description: sore Aggravating factors: Use of the arm Relieving factors: Not using the arm is much  PRECAUTIONS: Shoulder  RED FLAGS: None   WEIGHT BEARING RESTRICTIONS: No  FALLS:  Has patient fallen in last 6 months? No  LIVING ENVIRONMENT:   OCCUPATION: Nurse   PLOF: Independent  PATIENT GOALS:  Patient wants to be functional   NEXT MD VISIT:  2 weeks prior    OBJECTIVE:  Note: Objective measures were completed at Evaluation unless otherwise noted.  DIAGNOSTIC FINDINGS:  Nothing post op   PATIENT SURVEYS :    COGNITION: Overall cognitive status: Within functional limits for tasks assessed     SENSATION: Denies parathesias   POSTURE: Good   UPPER EXTREMITY ROM:   Passive ROM Right eval Left eval Left 1/21 L  L  Shoulder flexion  85 106 130AROM 160PROM  148AROM supine 158PROM  Shoulder extension       Shoulder abduction     112 active supine  Shoulder adduction       Shoulder internal rotation  Can sit on body comfortably   L gute  65/L iliac crest but can get to L4 with some compensation  Shoulder external rotation  20 26  50/T3  Elbow flexion       Elbow extension       Wrist flexion       Wrist extension       Wrist ulnar deviation       Wrist radial deviation       Wrist pronation       Wrist supination       (Blank rows = not tested)  UPPER EXTREMITY MMT:  MMT Right 3/25 Left 3/25  Shoulder flexion 15.4 8.2  Shoulder extension    Shoulder abduction 9.9 6.8  Shoulder adduction    Shoulder internal rotation 11.3 8.8  Shoulder external rotation 11.5 8.1  Middle trapezius    Lower trapezius    Elbow flexion    Elbow extension     Wrist flexion    Wrist extension    Wrist ulnar deviation    Wrist radial deviation    Wrist pronation    Wrist supination    Grip strength (lbs)    (Blank rows = not tested)      PALPATION:  No unexpected TTP  TREATMENT DATE:  4/8: Standing horiz abd- using trunk to hold bolster to the wall Seated roller hip hinge with lower trap liftoff Standing bent over triceps ext 3lb with various positions.  ER to [facility] press 3lb Shuttle- press+ UE flexion, dead bug with red tband   06-03-23: Mini wall push up Static wall push up with row to scap retraction Standing forward press yellow tband post anchor Single arm lat press on ball  3/25 There Ex: Updated MMT and ROM Prone IYT on physioball x10ea 0#, x10ea 1# Ball rolls 4 way at wall with 2.2# ball x15ea   TherAct: Standing shoulder flexion YTB 2x10 to ~75deg Standing bil PNF D2 x10 Wiping counter with wash cloth to simulate housework  Manual: PROM L shoulder STM periscap mm bil, upper traps, levator scaps, lats   3/21 There Ex: Supine active shoulder flexion 2# L shoulder 2x10 Standing Y's bilateral in front of mirror (tactile cues for decreasing UT hike) Standing bil shoulder flexion in front of mirror  Ball rolls 4 way at wall with 2.2# ball x15ea   Manual: PROM L shoulder STM periscap mm bil, upper traps, levator scaps, lats       PATIENT EDUCATION: Education details:  HEP, exercise form, rationale of interventions, protocol restrictions and limitations, protocol ranges, and ROM findings Person educated: Patient Education method: Explanation, Demonstration, Tactile cues, Verbal cues Education comprehension: verbalized understanding, returned demonstration, verbal cues required, tactile cues required, and needs further education  HOME EXERCISE PROGRAM: Access Code: [email or web address] URL:  [email or web address]   ASSESSMENT:  CLINICAL IMPRESSION: Progressed weight and full mechanical chain involvement today with good tolerance. Winging improved on right but requires frequent cues.    OBJECTIVE IMPAIRMENTS:  decreased ROM, decreased strength, impaired UE functional use, and pain.   ACTIVITY LIMITATIONS: carrying, lifting, sleeping, bathing, toileting, dressing, self feeding, reach over head, and hygiene/grooming  PARTICIPATION LIMITATIONS: meal prep, cleaning, laundry, driving, shopping, community activity, occupation, and yard work  PERSONAL FACTORS: 1-2 comorbidities: previous replacement on the same shoulder osteoporosis; multi joint OA   are also affecting patient's functional outcome.   REHAB POTENTIAL: Good  CLINICAL DECISION MAKING: Stable/uncomplicated  EVALUATION COMPLEXITY: Low  GOALS: Goals reviewed with patient? Yes  SHORT TERM GOALS: Target date: 04/16/2023    Patient will increase passive flexion to 115 degrees  Baseline: Goal status: MET  2.  Patient will increase left shoulder  ER to 30 degrees  Baseline:  Goal status: MET  3.  Patient will use her left arm for ADL's below 90 degrees with > 2/10 pain  Baseline:  Goal status: IN PROGRESS   LONG TERM GOALS: Target date: POC DATE    The patient will reach overhead with left arm without pain in order to perform ADL's  Baseline:  Goal status: IN PROGRESS  2.  Patient will each behind her head without pain in order to per from her ADL's  Baseline:  Goal status: IN PROGRESS  3.  Patient will reach behind her back to L3 without pain in order to perform ADL's  Baseline:  Goal status: IN PROGRESS L4 on 3/25  4. Able to demo overhead activity without scapular winging bilaterally  Status: New 5. Strength available for ADLs with weight/resistance such as washing dishes, vacuuming   Status: New 6. Feel prepared to lift grand children when I have them.   Status:  New   PLAN: PT FREQUENCY: 2x/week  PT DURATION: 8 weeks  PLANNED INTERVENTIONS: 97110-Therapeutic exercises, 97530- Therapeutic activity, [id number]- Neuromuscular  re-education, [phone]- Self Care, 60454- Manual therapy, 7113- Aquatic Therapy, 97014- Electrical stimulation (unattended), 97035- Ultrasound, Patient/Family education, Taping, Dry Needling, DME instructions, Cryotherapy, and Moist heat   PLAN FOR NEXT SESSION:  Cont per Dr. Dub Mikes Reverse total shoulder protocol ( will be on Dave's desk )    Nanette Wirsing C. Icker Swigert PT, DPT 05/28/23 10:37 AM

## 2023-05-31 ENCOUNTER — Other Ambulatory Visit (HOSPITAL_COMMUNITY): Payer: Self-pay

## 2023-05-31 ENCOUNTER — Encounter: Payer: Self-pay | Admitting: Family Medicine

## 2023-06-03 ENCOUNTER — Other Ambulatory Visit: Payer: Self-pay | Admitting: Family

## 2023-06-03 ENCOUNTER — Encounter: Payer: Self-pay | Admitting: Podiatry

## 2023-06-03 DIAGNOSIS — R970 Elevated carcinoembryonic antigen [CEA]: Secondary | ICD-10-CM

## 2023-06-04 ENCOUNTER — Other Ambulatory Visit: Payer: Self-pay | Admitting: Podiatry

## 2023-06-04 ENCOUNTER — Encounter (HOSPITAL_BASED_OUTPATIENT_CLINIC_OR_DEPARTMENT_OTHER): Payer: Self-pay | Admitting: Physical Therapy

## 2023-06-04 ENCOUNTER — Ambulatory Visit (HOSPITAL_BASED_OUTPATIENT_CLINIC_OR_DEPARTMENT_OTHER): Admitting: Physical Therapy

## 2023-06-04 DIAGNOSIS — M25612 Stiffness of left shoulder, not elsewhere classified: Secondary | ICD-10-CM | POA: Diagnosis not present

## 2023-06-04 DIAGNOSIS — M25512 Pain in left shoulder: Secondary | ICD-10-CM

## 2023-06-04 NOTE — Progress Notes (Signed)
error 

## 2023-06-04 NOTE — Therapy (Signed)
 OUTPATIENT PHYSICAL THERAPY UPPER EXTREMITY TREATMENT   Patient Name: STAPHANIE HARBISON MRN: [id number] DOB:[birth year], 63 y.o., female Today's Date: 06/04/2023  END OF SESSION:  PT End of Session - 06/04/23 0941     Visit Number 23    Number of Visits 39    Date for PT Re-Evaluation 07/20/23    PT Start Time 0940    PT Stop Time 1025    PT Time Calculation (min) 45 min    Activity Tolerance Patient tolerated treatment well    Behavior During Therapy [facility] for tasks assessed/performed                    Past Medical History:  Diagnosis Date   Allergy    Anemia    Arthritis    Hyperlipidemia    Insomnia    Mitral valve prolapse    PONV (postoperative nausea and vomiting)    Past Surgical History:  Procedure Laterality Date   AUGMENTATION MAMMAPLASTY Bilateral    Dec 2019   BREAST BIOPSY Right    axillary node benign   EYE SURGERY  2010   Lasik for distance correction; use one contact in left eye for reading now   JOINT REPLACEMENT  2021; 2022; 2025   reverse shoulder on the right; anatomic replacement on the left then conversion to reverse replacement due to failed anatomic   KNEE SURGERY  1988   right   SHOULDER ARTHROSCOPY WITH LABRAL REPAIR Right 08/28/2012   Procedure: RIGHT SHOULDER ARTHROSCOPY WITH LABRAL DEBRIDEMENT, ASPIRATION AND DECOMPRESSION OF PARALABRAL CYST;  Surgeon: Glo Larch, MD;  Location: [facility];  Service: Orthopedics;  Laterality: Right;   TOTAL SHOULDER ARTHROPLASTY Right 03/24/2020   Procedure: RIGHT REVERSE SHOULDER ARTHROPLASTY;  Surgeon: Ellard Gunning, MD;  Location: [facility];  Service: Orthopedics;  Laterality: Right;    TOTAL SHOULDER ARTHROPLASTY Left 02/23/2021   Procedure: TOTAL SHOULDER ARTHROPLASTY;  Surgeon: Ellard Gunning, MD;  Location: [facility];  Service: Orthopedics;  Laterality: Left;   TOTAL SHOULDER REVISION Left 02/07/2023   Procedure: Excision Left shoulder nodule; Conversion of left anatomic shoulder  arthroplasty to reverse shoulder arthroplasty;  Surgeon: Ellard Gunning, MD;  Location: [facility];  Service: Orthopedics;  Laterality: Left;   Patient Active Problem List   Diagnosis Date Noted   Onychomycosis 05/10/2023   Hypercalciuria 02/02/2023   DDD (degenerative disc disease), cervical 04/13/2021   Osteoporosis without current pathological fracture 10/28/2020   Nonallopathic lesion of sacral region 02/26/2020   Hot flashes 02/23/2019   Other insomnia 02/23/2019   Cystic acne vulgaris 02/23/2019   Bunion of right foot 02/23/2019   Varicose 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 02/23/2019   Multiple atypical nevi 02/23/2019   Mitral valve prolapse 03/03/2009    PCP: Nedra Ball [name]   REFERRING PROVIDER: Ruthy Cox Supple  REFERRING DIAG:  Diagnosis  [phone] (ICD-10-CM) - Presence of left artificial shoulder joint    THERAPY DIAG:  Stiffness of left shoulder, not elsewhere classified  Acute pain of left shoulder  Rationale for Evaluation and Treatment: Rehabilitation  ONSET DATE:  DOS: 02/07/2023  SUBJECTIVE:  SUBJECTIVE STATEMENT: I can tell we are using it. Neck is always sore.    From Initial Evaluation: Patient had a left standard total shoulder replacement last year.  She did well for about 6 months then began having progressive pain in the shoulder.  She had to have a shoulder revision with a reverse total shoulder.  At this time her pain is well-controlled.  She is also had a right shoulder replaced.  When she had that replaced she had significant pain.  She reports this 1 is better.  She is wearing her sling outside the house and when sleeping.  She is otherwise has been wearing her sling.  She has increased pain when she moves her arm.  She has been compliant with her HEP so  far. Hand dominance: Right  PERTINENT HISTORY: Multijoint arthritis including knees and hips.  Osteoporosis, right shoulder replacement, mitral valve prolapse  PAIN:  Are you having pain? Yes: NPRS scale: 0/10  Pain location: hips to neck Pain description: sore Aggravating factors: Use of the arm Relieving factors: Not using the arm is much  PRECAUTIONS: Shoulder  RED FLAGS: None   WEIGHT BEARING RESTRICTIONS: No  FALLS:  Has patient fallen in last 6 months? No  LIVING ENVIRONMENT:   OCCUPATION: Nurse   PLOF: Independent  PATIENT GOALS:  Patient wants to be functional   NEXT MD VISIT:  2 weeks prior    OBJECTIVE:  Note: Objective measures were completed at Evaluation unless otherwise noted.  DIAGNOSTIC FINDINGS:  Nothing post op   PATIENT SURVEYS :    COGNITION: Overall cognitive status: Within functional limits for tasks assessed     SENSATION: Denies parathesias   POSTURE: Good   UPPER EXTREMITY ROM:   Passive ROM Right eval Left eval Left 1/21 L  L  Shoulder flexion  85 106 130AROM 160PROM  148AROM supine 158PROM  Shoulder extension       Shoulder abduction     112 active supine  Shoulder adduction       Shoulder internal rotation  Can sit on body comfortably   L gute  65/L iliac crest but can get to L4 with some compensation  Shoulder external rotation  20 26  50/T3  Elbow flexion       Elbow extension       Wrist flexion       Wrist extension       Wrist ulnar deviation       Wrist radial deviation       Wrist pronation       Wrist supination       (Blank rows = not tested)  UPPER EXTREMITY MMT:  MMT Right 3/25 Left 3/25  Shoulder flexion 15.4 8.2  Shoulder extension    Shoulder abduction 9.9 6.8  Shoulder adduction    Shoulder internal rotation 11.3 8.8  Shoulder external rotation 11.5 8.1  Middle trapezius    Lower trapezius    Elbow flexion    Elbow extension    Wrist flexion    Wrist extension    Wrist ulnar  deviation    Wrist radial deviation    Wrist pronation    Wrist supination    Grip strength (lbs)    (Blank rows = not tested)      PALPATION:  No unexpected TTP  TREATMENT DATE: Treatment                            06/04/23: Blank lines following charge title = not provided on this treatment date.   Manual:  TPDN YES Trigger Point Dry Needling  Subsequent Treatment: Instructions provided previously at initial dry needling treatment.   Patient Verbal Consent Given: Yes Education Handout Provided: Previously Provided Muscles Treated: bilateral: lats, infraspinatus; Rt rhomboids, Lt upper trap Electrical Stimulation Performed: No Treatment Response/Outcome: twitch with decreased spasm Passive bending technique for pecs Prone rib mobility Supine cervical traction There-ex: UBE retro 5 min There-Act:  Self Care:  Nuro-Re-ed:    4/8: Standing horiz abd- using trunk to hold bolster to the wall Seated roller hip hinge with lower trap liftoff Standing bent over triceps ext 3lb with various positions.  ER to [facility] press 3lb Shuttle- press+ UE flexion, dead bug with red tband   26-May-2023: Mini wall push up Static wall push up with row to scap retraction Standing forward press yellow tband post anchor Single arm lat press on ball  3/25 There Ex: Updated MMT and ROM Prone IYT on physioball x10ea 0#, x10ea 1# Ball rolls 4 way at wall with 2.2# ball x15ea   TherAct: Standing shoulder flexion YTB 2x10 to ~75deg Standing bil PNF D2 x10 Wiping counter with wash cloth to simulate housework  Manual: PROM L shoulder STM periscap mm bil, upper traps, levator scaps, lats   3/21 There Ex: Supine active shoulder flexion 2# L shoulder 2x10 Standing Y's bilateral in front of mirror (tactile cues for decreasing UT hike) Standing bil shoulder flexion in  front of mirror  Ball rolls 4 way at wall with 2.2# ball x15ea   Manual: PROM L shoulder STM periscap mm bil, upper traps, levator scaps, lats       PATIENT EDUCATION: Education details:  HEP, exercise form, rationale of interventions, protocol restrictions and limitations, protocol ranges, and ROM findings Person educated: Patient Education method: Explanation, Demonstration, Tactile cues, Verbal cues Education comprehension: verbalized understanding, returned demonstration, verbal cues required, tactile cues required, and needs further education  HOME EXERCISE PROGRAM: Access Code: [email or web address] URL: [email or web address]   ASSESSMENT:  CLINICAL IMPRESSION: Significant focus on manual therapy today to allow for freedom of movement which was well tolerated. Able to do UBE end of session without winging. Beginning of spasm in Rt upper trap/levator at the 5 min mark- discussed taking stretch breaks to avoid spasm and return to exercise.    OBJECTIVE IMPAIRMENTS:  decreased ROM, decreased strength, impaired UE functional use, and pain.   ACTIVITY LIMITATIONS: carrying, lifting, sleeping, bathing, toileting, dressing, self feeding, reach over head, and hygiene/grooming  PARTICIPATION LIMITATIONS: meal prep, cleaning, laundry, driving, shopping, community activity, occupation, and yard work  PERSONAL FACTORS: 1-2 comorbidities: previous replacement on the same shoulder osteoporosis; multi joint OA   are also affecting patient's functional outcome.   REHAB POTENTIAL: Good  CLINICAL DECISION MAKING: Stable/uncomplicated  EVALUATION COMPLEXITY: Low  GOALS: Goals reviewed with patient? Yes  SHORT TERM GOALS: Target date: 04/16/2023    Patient will increase passive flexion to 115 degrees  Baseline: Goal status: MET  2.  Patient will increase left shoulder  ER to 30 degrees  Baseline:  Goal status: MET  3.  Patient will use her left arm for ADL's below 90  degrees with > 2/10 pain  Baseline:  Goal status: IN PROGRESS   LONG TERM GOALS: Target  date: POC DATE    The patient will reach overhead with left arm without pain in order to perform ADL's  Baseline:  Goal status: IN PROGRESS  2.  Patient will each behind her head without pain in order to per from her ADL's  Baseline:  Goal status: IN PROGRESS  3.  Patient will reach behind her back to L3 without pain in order to perform ADL's  Baseline:  Goal status: IN PROGRESS L4 on 3/25  4. Able to demo overhead activity without scapular winging bilaterally  Status: New 5. Strength available for ADLs with weight/resistance such as washing dishes, vacuuming   Status: New 6. Feel prepared to lift grand children when I have them.   Status: New   PLAN: PT FREQUENCY: 2x/week  PT DURATION: 8 weeks  PLANNED INTERVENTIONS: 97110-Therapeutic exercises, 97530- Therapeutic activity, [id number]- Neuromuscular re-education, 97535- Self Care, [id number]- Manual therapy, 7113- Aquatic Therapy, 97014- Electrical stimulation (unattended), 97035- Ultrasound, Patient/Family education, Taping, Dry Needling, DME instructions, Cryotherapy, and Moist heat   PLAN FOR NEXT SESSION:  Cont per Dr. Leandro Proffer Reverse total shoulder protocol ( will be on Dave's desk )    Lathen Seal C. Torren Maffeo PT, DPT 06/04/23 10:29 AM

## 2023-06-05 ENCOUNTER — Other Ambulatory Visit: Payer: Self-pay | Admitting: Podiatry

## 2023-06-05 MED ORDER — EFINACONAZOLE 10 % EX SOLN: 1.0000 [drp] | Freq: Every day | CUTANEOUS | 11 refills | Status: AC

## 2023-06-05 NOTE — Telephone Encounter (Signed)
 Prolia VOB initiated via [email or web address]  Next Prolia inj DUE: 07/01/23

## 2023-06-10 ENCOUNTER — Ambulatory Visit: Payer: 59 | Admitting: Psychology

## 2023-06-10 ENCOUNTER — Other Ambulatory Visit: Payer: Self-pay

## 2023-06-11 ENCOUNTER — Ambulatory Visit (HOSPITAL_BASED_OUTPATIENT_CLINIC_OR_DEPARTMENT_OTHER): Admitting: Physical Therapy

## 2023-06-11 ENCOUNTER — Telehealth: Payer: Self-pay

## 2023-06-11 ENCOUNTER — Encounter (HOSPITAL_BASED_OUTPATIENT_CLINIC_OR_DEPARTMENT_OTHER): Payer: Self-pay | Admitting: Physical Therapy

## 2023-06-11 DIAGNOSIS — M25612 Stiffness of left shoulder, not elsewhere classified: Secondary | ICD-10-CM

## 2023-06-11 DIAGNOSIS — M25512 Pain in left shoulder: Secondary | ICD-10-CM | POA: Diagnosis not present

## 2023-06-11 NOTE — Telephone Encounter (Signed)
 Hendricks, Cayarel L, CMA3 hours ago (1:17 PM)    [employer] left vm that current VOB was not correct and to discard. They will be running the VOB again and will send over. Case# [id number]

## 2023-06-11 NOTE — Telephone Encounter (Signed)
 Noted, thank you

## 2023-06-11 NOTE — Therapy (Signed)
 OUTPATIENT PHYSICAL THERAPY UPPER EXTREMITY TREATMENT   Patient Name: Tara [name] MRN: [id number] DOB:1960/06/07, 63 y.o., female Today's Date: 06/11/2023  END OF SESSION:  PT End of Session - 06/11/23 1149     Visit Number 24    Number of Visits 39    Date for PT Re-Evaluation 07/20/23    PT Start Time 1145    PT Stop Time 1227    PT Time Calculation (min) 42 min    Activity Tolerance Patient tolerated treatment well    Behavior During Therapy [facility] for tasks assessed/performed                     Past Medical History:  Diagnosis Date   Allergy    Anemia    Arthritis    Hyperlipidemia    Insomnia    Mitral valve prolapse    PONV (postoperative nausea and vomiting)    Past Surgical History:  Procedure Laterality Date   AUGMENTATION MAMMAPLASTY Bilateral    Dec 2019   BREAST BIOPSY Right    axillary node benign   EYE SURGERY  2010   Lasik for distance correction; use one contact in left eye for reading now   JOINT REPLACEMENT  2021; 2022; 2025   reverse shoulder on the right; anatomic replacement on the left then conversion to reverse replacement due to failed anatomic   KNEE SURGERY  1988   right   SHOULDER ARTHROSCOPY WITH LABRAL REPAIR Right 08/28/2012   Procedure: RIGHT SHOULDER ARTHROSCOPY WITH LABRAL DEBRIDEMENT, ASPIRATION AND DECOMPRESSION OF PARALABRAL CYST;  Surgeon: Glo Larch, MD;  Location: [facility];  Service: Orthopedics;  Laterality: Right;   TOTAL SHOULDER ARTHROPLASTY Right 03/24/2020   Procedure: RIGHT REVERSE SHOULDER ARTHROPLASTY;  Surgeon: Ellard Gunning, MD;  Location: [facility];  Service: Orthopedics;  Laterality: Right;    TOTAL SHOULDER ARTHROPLASTY Left 02/23/2021   Procedure: TOTAL SHOULDER ARTHROPLASTY;  Surgeon: Ellard Gunning, MD;  Location: [facility];  Service: Orthopedics;  Laterality: Left;   TOTAL SHOULDER REVISION Left 02/07/2023   Procedure: Excision Left shoulder nodule; Conversion of left anatomic shoulder  arthroplasty to reverse shoulder arthroplasty;  Surgeon: Ellard Gunning, MD;  Location: [facility];  Service: Orthopedics;  Laterality: Left;   Patient Active Problem List   Diagnosis Date Noted   Onychomycosis 05/10/2023   Hypercalciuria 02/02/2023   DDD (degenerative disc disease), cervical 04/13/2021   Osteoporosis without current pathological fracture 10/28/2020   Nonallopathic lesion of sacral region 02/26/2020   Hot flashes 02/23/2019   Other insomnia 02/23/2019   Cystic acne vulgaris 02/23/2019   Bunion of right foot 02/23/2019   Varicose 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 02/23/2019   Multiple atypical nevi 02/23/2019   Mitral valve prolapse 03/03/2009    PCP: Nedra Ball [name]   REFERRING PROVIDER: Ruthy Cox Supple  REFERRING DIAG:  Diagnosis  [id number] (ICD-10-CM) - Presence of left artificial shoulder joint    THERAPY DIAG:  Stiffness of left shoulder, not elsewhere classified  Acute pain of left shoulder  Rationale for Evaluation and Treatment: Rehabilitation  ONSET DATE:  DOS: 02/07/2023  SUBJECTIVE:  SUBJECTIVE STATEMENT: I wanted to start jogging but I found I wasn't able to do so without constant correction through shoulders.    From Initial Evaluation: Patient had a left standard total shoulder replacement last year.  She did well for about 6 months then began having progressive pain in the shoulder.  She had to have a shoulder revision with a reverse total shoulder.  At this time her pain is well-controlled.  She is also had a right shoulder replaced.  When she had that replaced she had significant pain.  She reports this 1 is better.  She is wearing her sling outside the house and when sleeping.  She is otherwise has been wearing her sling.  She has increased pain when she  moves her arm.  She has been compliant with her HEP so far. Hand dominance: Right  PERTINENT HISTORY: Multijoint arthritis including knees and hips.  Osteoporosis, right shoulder replacement, mitral valve prolapse  PAIN:  Are you having pain? Yes: NPRS scale: 0/10  Pain location: hips to neck Pain description: sore Aggravating factors: Use of the arm Relieving factors: Not using the arm is much  PRECAUTIONS: Shoulder  RED FLAGS: None   WEIGHT BEARING RESTRICTIONS: No  FALLS:  Has patient fallen in last 6 months? No  LIVING ENVIRONMENT:   OCCUPATION: Nurse   PLOF: Independent  PATIENT GOALS:  Patient wants to be functional   NEXT MD VISIT:  2 weeks prior    OBJECTIVE:  Note: Objective measures were completed at Evaluation unless otherwise noted.  DIAGNOSTIC FINDINGS:  Nothing post op   PATIENT SURVEYS :    COGNITION: Overall cognitive status: Within functional limits for tasks assessed     SENSATION: Denies parathesias   POSTURE: Good   UPPER EXTREMITY ROM:   Passive ROM Right eval Left eval Left 1/21 L  L  Shoulder flexion  85 106 130AROM 160PROM  148AROM supine 158PROM  Shoulder extension       Shoulder abduction     112 active supine  Shoulder adduction       Shoulder internal rotation  Can sit on body comfortably   L gute  65/L iliac crest but can get to L4 with some compensation  Shoulder external rotation  20 26  50/T3  Elbow flexion       Elbow extension       Wrist flexion       Wrist extension       Wrist ulnar deviation       Wrist radial deviation       Wrist pronation       Wrist supination       (Blank rows = not tested)  UPPER EXTREMITY MMT:  MMT Right 3/25 Left 3/25 Lt 4/22  Shoulder flexion 15.4 8.2 9.5  Shoulder extension     Shoulder abduction 9.9 6.8 7.3  Shoulder adduction     Shoulder internal rotation 11.3 8.8 6.4  Shoulder external rotation 11.5 8.1 7.8  Middle trapezius     Lower trapezius     Elbow  flexion     Elbow extension     Wrist flexion     Wrist extension     Wrist ulnar deviation     Wrist radial deviation     Wrist pronation     Wrist supination     Grip strength (lbs)     (Blank rows = not tested)      PALPATION:  No unexpected TTP  TREATMENT DATE: Treatment                            4/22: Blank lines following charge title = not provided on this treatment date.   Manual:  TPDN No  There-ex: Single leg diver 1lb 6" step up with OH press 1lb Dead bug progression 1lb in hand, legs extended Bridge iso abd/add of lower body with core Bridge with alt chest fly 1lb Sidelying punch to flexion Self Care:  Nuro-Re-ed:  Gait Training:    Treatment                            06/04/23: Blank lines following charge title = not provided on this treatment date.   Manual:  TPDN YES Trigger Point Dry Needling  Subsequent Treatment: Instructions provided previously at initial dry needling treatment.   Patient Verbal Consent Given: Yes Education Handout Provided: Previously Provided Muscles Treated: bilateral: lats, infraspinatus; Rt rhomboids, Lt upper trap Electrical Stimulation Performed: No Treatment Response/Outcome: twitch with decreased spasm Passive bending technique for pecs Prone rib mobility Supine cervical traction There-ex: UBE retro 5 min There-Act:  Self Care:  Nuro-Re-ed:    4/8: Standing horiz abd- using trunk to hold bolster to the wall Seated roller hip hinge with lower trap liftoff Standing bent over triceps ext 3lb with various positions.  ER to [facility] press 3lb Shuttle- press+ UE flexion, dead bug with red tband    PATIENT EDUCATION: Education details:  HEP, exercise form, rationale of interventions, protocol restrictions and limitations, protocol ranges, and ROM findings Person educated:  Patient Education method: Explanation, Demonstration, Tactile cues, Verbal cues Education comprehension: verbalized understanding, returned demonstration, verbal cues required, tactile cues required, and needs further education  HOME EXERCISE PROGRAM: Access Code: [email or web address] URL: [email or web address]   ASSESSMENT:  CLINICAL IMPRESSION: ROM is improving with weakness in rotation noted. Pt has improved significantly in control of scapular winging on the right side. ROM overhead in standing reaches approx 120, sidelying with gravity introduced to RC control- flexion to 90 available.    OBJECTIVE IMPAIRMENTS:  decreased ROM, decreased strength, impaired UE functional use, and pain.   ACTIVITY LIMITATIONS: carrying, lifting, sleeping, bathing, toileting, dressing, self feeding, reach over head, and hygiene/grooming  PARTICIPATION LIMITATIONS: meal prep, cleaning, laundry, driving, shopping, community activity, occupation, and yard work  PERSONAL FACTORS: 1-2 comorbidities: previous replacement on the same shoulder osteoporosis; multi joint OA   are also affecting patient's functional outcome.   REHAB POTENTIAL: Good  CLINICAL DECISION MAKING: Stable/uncomplicated  EVALUATION COMPLEXITY: Low  GOALS: Goals reviewed with patient? Yes  SHORT TERM GOALS: Target date: 04/16/2023    Patient will increase passive flexion to 115 degrees  Baseline: Goal status: MET  2.  Patient will increase left shoulder  ER to 30 degrees  Baseline:  Goal status: MET  3.  Patient will use her left arm for ADL's below 90 degrees with > 2/10 pain  Baseline:  Goal status: IN PROGRESS   LONG TERM GOALS: Target date: POC DATE    The patient will reach overhead with left arm without pain in order to perform ADL's  Baseline:  Goal status: IN PROGRESS  2.  Patient will each behind her head without pain in order to per from her ADL's  Baseline:  Goal status: IN PROGRESS  3.  Patient  will reach behind her back to L3 without pain in order  to perform ADL's  Baseline:  Goal status: IN PROGRESS L4 on 3/25  4. Able to demo overhead activity without scapular winging bilaterally  Status: New 5. Strength available for ADLs with weight/resistance such as washing dishes, vacuuming   Status: New 6. Feel prepared to lift grand children when I have them.   Status: New   PLAN: PT FREQUENCY: 2x/week  PT DURATION: 8 weeks  PLANNED INTERVENTIONS: 97110-Therapeutic exercises, 97530- Therapeutic activity, [id number]- Neuromuscular re-education, 97535- Self Care, 40981- Manual therapy, 7113- Aquatic Therapy, 97014- Electrical stimulation (unattended), 97035- Ultrasound, Patient/Family education, Taping, Dry Needling, DME instructions, Cryotherapy, and Moist heat   PLAN FOR NEXT SESSION:  Cont per Dr. Leandro Proffer Reverse total shoulder protocol ( will be on Dave's desk )    Zaiah Eckerson C. Emani Taussig PT, DPT 06/11/23 12:29 PM

## 2023-06-11 NOTE — Telephone Encounter (Signed)
 [employer] left vm that current VOB was not correct and to discard. They will be running the VOB again and will send over. Case# [id number]

## 2023-06-13 ENCOUNTER — Telehealth: Payer: Self-pay | Admitting: Family

## 2023-06-13 NOTE — Telephone Encounter (Signed)
 Message received from PA at cancer center states CEA levels were mildly elevated without any other symptoms and colonoscopy in 2024 was normal.No suspicion of caner. If having any symptoms will need a CT scan prior to referral to cancer center.

## 2023-06-17 ENCOUNTER — Encounter (HOSPITAL_BASED_OUTPATIENT_CLINIC_OR_DEPARTMENT_OTHER): Admitting: Physical Therapy

## 2023-06-17 ENCOUNTER — Ambulatory Visit: Payer: 59 | Admitting: Psychology

## 2023-06-17 ENCOUNTER — Encounter: Payer: Self-pay | Admitting: Family Medicine

## 2023-06-17 NOTE — Telephone Encounter (Signed)
Message routed to PCP [name], [name], NP  

## 2023-06-17 NOTE — Telephone Encounter (Signed)
 Forwarding to Dr. Denyse Amass to review and advise.

## 2023-06-18 ENCOUNTER — Other Ambulatory Visit: Payer: Self-pay

## 2023-06-18 DIAGNOSIS — M545 Low back pain, unspecified: Secondary | ICD-10-CM

## 2023-06-19 ENCOUNTER — Encounter (HOSPITAL_BASED_OUTPATIENT_CLINIC_OR_DEPARTMENT_OTHER)

## 2023-06-20 ENCOUNTER — Telehealth: Payer: Self-pay

## 2023-06-20 ENCOUNTER — Other Ambulatory Visit (HOSPITAL_COMMUNITY): Payer: Self-pay

## 2023-06-20 MED ORDER — DIPHENHYDRAMINE HCL 50 MG PO TABS
ORAL_TABLET | ORAL | 0 refills | Status: DC
  Filled 2023-06-20: qty 1, fill #0

## 2023-06-20 MED ORDER — PREDNISONE 50 MG PO TABS
ORAL_TABLET | ORAL | 0 refills | Status: DC
  Filled 2023-06-20: qty 3, 1d supply, fill #0

## 2023-06-21 ENCOUNTER — Other Ambulatory Visit: Payer: Self-pay

## 2023-06-21 ENCOUNTER — Ambulatory Visit (HOSPITAL_COMMUNITY): Attending: Cardiology

## 2023-06-21 ENCOUNTER — Other Ambulatory Visit (HOSPITAL_COMMUNITY): Payer: Self-pay

## 2023-06-21 DIAGNOSIS — I340 Nonrheumatic mitral (valve) insufficiency: Secondary | ICD-10-CM | POA: Insufficient documentation

## 2023-06-21 DIAGNOSIS — I081 Rheumatic disorders of both mitral and tricuspid valves: Secondary | ICD-10-CM

## 2023-06-22 LAB — ECHOCARDIOGRAM COMPLETE
Area-P 1/2: 4.06 cm2
S' Lateral: 2.64 cm

## 2023-06-23 ENCOUNTER — Encounter: Payer: Self-pay | Admitting: Cardiovascular Disease

## 2023-06-23 NOTE — Telephone Encounter (Signed)
 Prior Authorization REQUIRED for PROLIA    PA PROCESS DETAILS: PA is required and is currently not on file. Please call Medical Review at [phone] or complete the PA form available at  [email or web address]   Fax completed form to [phone]. Or submit PA  request online at [email or web address].   ELIGIBLE FOR [employer] COPAY ASSISTANCE PROGRAM

## 2023-06-25 ENCOUNTER — Ambulatory Visit (HOSPITAL_BASED_OUTPATIENT_CLINIC_OR_DEPARTMENT_OTHER): Attending: Orthopedic Surgery | Admitting: Physical Therapy

## 2023-06-25 ENCOUNTER — Encounter (HOSPITAL_BASED_OUTPATIENT_CLINIC_OR_DEPARTMENT_OTHER): Payer: Self-pay | Admitting: Physical Therapy

## 2023-06-25 DIAGNOSIS — M25512 Pain in left shoulder: Secondary | ICD-10-CM | POA: Diagnosis not present

## 2023-06-25 DIAGNOSIS — M25612 Stiffness of left shoulder, not elsewhere classified: Secondary | ICD-10-CM | POA: Insufficient documentation

## 2023-06-25 NOTE — Therapy (Signed)
 OUTPATIENT PHYSICAL THERAPY UPPER EXTREMITY TREATMENT   Patient Name: Tara [name] MRN: [id number] DOB:09/20/1960, 63 y.o., female Today's Date: 06/25/2023  END OF SESSION:  PT End of Session - 06/25/23 1153     Visit Number 25    Number of Visits 39    Date for PT Re-Evaluation 07/20/23    PT Start Time 1148    PT Stop Time 1229    PT Time Calculation (min) 41 min    Activity Tolerance Patient tolerated treatment well    Behavior During Therapy [facility] for tasks assessed/performed                      Past Medical History:  Diagnosis Date   Allergy    Anemia    Arthritis    Hyperlipidemia    Insomnia    Mitral valve prolapse    PONV (postoperative nausea and vomiting)    Past Surgical History:  Procedure Laterality Date   AUGMENTATION MAMMAPLASTY Bilateral    Dec 2019   BREAST BIOPSY Right    axillary node benign   EYE SURGERY  2010   Lasik for distance correction; use one contact in left eye for reading now   JOINT REPLACEMENT  2021; 2022; 2025   reverse shoulder on the right; anatomic replacement on the left then conversion to reverse replacement due to failed anatomic   KNEE SURGERY  1988   right   SHOULDER ARTHROSCOPY WITH LABRAL REPAIR Right 08/28/2012   Procedure: RIGHT SHOULDER ARTHROSCOPY WITH LABRAL DEBRIDEMENT, ASPIRATION AND DECOMPRESSION OF PARALABRAL CYST;  Surgeon: Glo Larch, MD;  Location: [facility];  Service: Orthopedics;  Laterality: Right;   TOTAL SHOULDER ARTHROPLASTY Right 03/24/2020   Procedure: RIGHT REVERSE SHOULDER ARTHROPLASTY;  Surgeon: Ellard Gunning, MD;  Location: [facility];  Service: Orthopedics;  Laterality: Right;    TOTAL SHOULDER ARTHROPLASTY Left 02/23/2021   Procedure: TOTAL SHOULDER ARTHROPLASTY;  Surgeon: Ellard Gunning, MD;  Location: [facility];  Service: Orthopedics;  Laterality: Left;   TOTAL SHOULDER REVISION Left 02/07/2023   Procedure: Excision Left shoulder nodule; Conversion of left anatomic shoulder  arthroplasty to reverse shoulder arthroplasty;  Surgeon: Ellard Gunning, MD;  Location: [facility];  Service: Orthopedics;  Laterality: Left;   Patient Active Problem List   Diagnosis Date Noted   Onychomycosis 05/10/2023   Hypercalciuria 02/02/2023   DDD (degenerative disc disease), cervical 04/13/2021   Osteoporosis without current pathological fracture 10/28/2020   Nonallopathic lesion of sacral region 02/26/2020   Hot flashes 02/23/2019   Other insomnia 02/23/2019   Cystic acne vulgaris 02/23/2019   Bunion of right foot 02/23/2019   Varicose 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 02/23/2019   Multiple atypical nevi 02/23/2019   Mitral valve prolapse 03/03/2009    PCP: Nedra Ball [name]   REFERRING PROVIDER: Ruthy Cox Supple  REFERRING DIAG:  Diagnosis  [phone] (ICD-10-CM) - Presence of left artificial shoulder joint    THERAPY DIAG:  Stiffness of left shoulder, not elsewhere classified  Rationale for Evaluation and Treatment: Rehabilitation  ONSET DATE:  DOS: 02/07/2023  SUBJECTIVE:  SUBJECTIVE STATEMENT: Started having Rt SIJ pain and dizziness when cervical rotation to the right in supine.    From Initial Evaluation: Patient had a left standard total shoulder replacement last year.  She did well for about 6 months then began having progressive pain in the shoulder.  She had to have a shoulder revision with a reverse total shoulder.  At this time her pain is well-controlled.  She is also had a right shoulder replaced.  When she had that replaced she had significant pain.  She reports this 1 is better.  She is wearing her sling outside the house and when sleeping.  She is otherwise has been wearing her sling.  She has increased pain when she moves her arm.  She has been compliant with her HEP so  far. Hand dominance: Right  PERTINENT HISTORY: Multijoint arthritis including knees and hips.  Osteoporosis, right shoulder replacement, mitral valve prolapse  PAIN:  Are you having pain? Yes: NPRS scale: 0/10  Pain location: hips to neck Pain description: sore Aggravating factors: Use of the arm Relieving factors: Not using the arm is much  PRECAUTIONS: Shoulder  RED FLAGS: None   WEIGHT BEARING RESTRICTIONS: No  FALLS:  Has patient fallen in last 6 months? No  LIVING ENVIRONMENT:   OCCUPATION: Nurse   PLOF: Independent  PATIENT GOALS:  Patient wants to be functional   OBJECTIVE:  Note: Objective measures were completed at Evaluation unless otherwise noted.  DIAGNOSTIC FINDINGS:  Nothing post op   PATIENT SURVEYS :    COGNITION: Overall cognitive status: Within functional limits for tasks assessed     SENSATION: Denies parathesias   POSTURE: Good   UPPER EXTREMITY ROM:   Passive ROM Right eval Left eval Left 1/21 L  L  Shoulder flexion  85 106 130AROM 160PROM  148AROM supine 158PROM  Shoulder extension       Shoulder abduction     112 active supine  Shoulder adduction       Shoulder internal rotation  Can sit on body comfortably   L gute  65/L iliac crest but can get to L4 with some compensation  Shoulder external rotation  20 26  50/T3  Elbow flexion       Elbow extension       Wrist flexion       Wrist extension       Wrist ulnar deviation       Wrist radial deviation       Wrist pronation       Wrist supination       (Blank rows = not tested)  UPPER EXTREMITY MMT:  MMT Right 3/25 Left 3/25 Lt 4/22  Shoulder flexion 15.4 8.2 9.5  Shoulder extension     Shoulder abduction 9.9 6.8 7.3  Shoulder adduction     Shoulder internal rotation 11.3 8.8 6.4  Shoulder external rotation 11.5 8.1 7.8  Middle trapezius     Lower trapezius     Elbow flexion     Elbow extension     Wrist flexion     Wrist extension     Wrist ulnar  deviation     Wrist radial deviation     Wrist pronation     Wrist supination     Grip strength (lbs)     (Blank rows = not tested)      PALPATION:  No unexpected TTP  TREATMENT DATE: Treatment                            5/6: Blank lines following charge title = not provided on this treatment date.   Manual:  TPDN YES Trigger Point Dry Needling  Subsequent Treatment: Instructions provided previously at initial dry needling treatment.   Patient Verbal Consent Given: Yes Education Handout Provided: Previously Provided Muscles Treated: Rt glut med, piriformis Electrical Stimulation Performed: No Treatment Response/Outcome: twitch with decreased tension Cervical traction Suboccipital release C0 on C1 Lt mobs Rt to Lt cervical mobs paired with Rt cervical sidebend There-ex:  There-Act:  Self Care: Posture eval: Lt shoulder elevation, resting in cervical right sidebend, Rt lower rib flare.  Nuro-Re-ed: Epley maneuver x2 Gait Training:    Treatment                            4/22: Blank lines following charge title = not provided on this treatment date.   Manual:  TPDN No  There-ex: Single leg diver 1lb 6" step up with OH press 1lb Dead bug progression 1lb in hand, legs extended Bridge iso abd/add of lower body with core Bridge with alt chest fly 1lb Sidelying punch to flexion Self Care:  Nuro-Re-ed:  Gait Training:    Treatment                            06/04/23: Blank lines following charge title = not provided on this treatment date.   Manual:  TPDN YES Trigger Point Dry Needling  Subsequent Treatment: Instructions provided previously at initial dry needling treatment.   Patient Verbal Consent Given: Yes Education Handout Provided: Previously Provided Muscles Treated: bilateral: lats, infraspinatus; Rt rhomboids, Lt upper  trap Electrical Stimulation Performed: No Treatment Response/Outcome: twitch with decreased spasm Passive bending technique for pecs Prone rib mobility Supine cervical traction There-ex: UBE retro 5 min      PATIENT EDUCATION: Education details:  HEP, exercise form, rationale of interventions, protocol restrictions and limitations, protocol ranges, and ROM findings Person educated: Patient Education method: Explanation, Demonstration, Tactile cues, Verbal cues Education comprehension: verbalized understanding, returned demonstration, verbal cues required, tactile cues required, and needs further education  HOME EXERCISE PROGRAM: Access Code: [email or web address] URL: [email or web address]   ASSESSMENT:  CLINICAL IMPRESSION: Biomechanical chain posture leading to pain mentioned. Able to reduce with manual therapy and able to reduce nystagmus in supine rt cervical rotation with epley x2- provided with handout to perform at home PRN.    OBJECTIVE IMPAIRMENTS:  decreased ROM, decreased strength, impaired UE functional use, and pain.   ACTIVITY LIMITATIONS: carrying, lifting, sleeping, bathing, toileting, dressing, self feeding, reach over head, and hygiene/grooming  PARTICIPATION LIMITATIONS: meal prep, cleaning, laundry, driving, shopping, community activity, occupation, and yard work  PERSONAL FACTORS: 1-2 comorbidities: previous replacement on the same shoulder osteoporosis; multi joint OA   are also affecting patient's functional outcome.   REHAB POTENTIAL: Good  CLINICAL DECISION MAKING: Stable/uncomplicated  EVALUATION COMPLEXITY: Low  GOALS: Goals reviewed with patient? Yes  SHORT TERM GOALS: Target date: 04/16/2023    Patient will increase passive flexion to 115 degrees  Baseline: Goal status: MET  2.  Patient will increase left shoulder  ER to 30 degrees  Baseline:  Goal status: MET  3.  Patient will use her left arm for ADL's below 90 degrees with >  2/10 pain  Baseline:  Goal status: MET   LONG TERM GOALS: Target date: POC DATE    The patient will reach overhead with left arm without pain in order to perform ADL's  Baseline:  Goal status: IN PROGRESS  2.  Patient will each behind her head without pain in order to per from her ADL's  Baseline:  Goal status: IN PROGRESS  3.  Patient will reach behind her back to L3 without pain in order to perform ADL's  Baseline:  Goal status: IN PROGRESS L4 on 3/25  4. Able to demo overhead activity without scapular winging bilaterally  Status: New 5. Strength available for ADLs with weight/resistance such as washing dishes, vacuuming   Status: New 6. Feel prepared to lift grand children when I have them.   Status: New   PLAN: PT FREQUENCY: 2x/week  PT DURATION: 8 weeks  PLANNED INTERVENTIONS: 97110-Therapeutic exercises, 97530- Therapeutic activity, [id number]- Neuromuscular re-education, 97535- Self Care, 60454- Manual therapy, 7113- Aquatic Therapy, 97014- Electrical stimulation (unattended), 97035- Ultrasound, Patient/Family education, Taping, Dry Needling, DME instructions, Cryotherapy, and Moist heat   PLAN FOR NEXT SESSION:  Cont per Dr. Leandro Proffer Reverse total shoulder protocol ( will be on Dave's desk )    Steele Ledonne C. Catherine Cubero PT, DPT 06/25/23 1:48 PM

## 2023-07-01 NOTE — Telephone Encounter (Signed)
 Prior Authorization initiated for PROLIA  via [employer] Case ID: [id number]   Benefit Type: Medical benefit  Pharmacy Status: Tech Review

## 2023-07-01 NOTE — Telephone Encounter (Signed)
 Hey Tara [name],  I see that pt has [name]. She is eligible to receive Prolia  at not cost through the [facility]. If this is something that she is interested in, I can send the script to the pharmacy and the pharmacist will call her for a quick phone visit. Once the visit has been completed, they will ship the medication to the office.   Please let me know if this is something that the pt would be interested in and I can work on pharmacy PA instead of medical PA.

## 2023-07-03 ENCOUNTER — Ambulatory Visit
Admission: RE | Admit: 2023-07-03 | Discharge: 2023-07-03 | Disposition: A | Discharge status: Home / Self Care | Source: Ambulatory Visit | Attending: Family Medicine | Admitting: Family Medicine

## 2023-07-03 DIAGNOSIS — E237 Disorder of pituitary gland, unspecified: Secondary | ICD-10-CM | POA: Diagnosis not present

## 2023-07-03 DIAGNOSIS — M545 Low back pain, unspecified: Secondary | ICD-10-CM | POA: Diagnosis not present

## 2023-07-03 DIAGNOSIS — R9082 White matter disease, unspecified: Secondary | ICD-10-CM | POA: Diagnosis not present

## 2023-07-03 MED ORDER — IOPAMIDOL (ISOVUE-300) INJECTION 61%
100.0000 mL | Freq: Once | INTRAVENOUS | Status: AC | PRN
  Administered 2023-07-03: 100 mL via INTRAVENOUS

## 2023-07-03 MED ORDER — GADOPICLENOL 0.5 MMOL/ML IV SOLN
7.0000 mL | Freq: Once | INTRAVENOUS | Status: AC | PRN
  Administered 2023-07-03: 7 mL via INTRAVENOUS

## 2023-07-04 ENCOUNTER — Other Ambulatory Visit (HOSPITAL_COMMUNITY): Payer: Self-pay

## 2023-07-04 ENCOUNTER — Ambulatory Visit: Attending: Cardiovascular Disease | Admitting: Cardiovascular Disease

## 2023-07-04 ENCOUNTER — Other Ambulatory Visit (HOSPITAL_BASED_OUTPATIENT_CLINIC_OR_DEPARTMENT_OTHER): Payer: Self-pay

## 2023-07-04 ENCOUNTER — Ambulatory Visit: Payer: Self-pay | Admitting: Family Medicine

## 2023-07-04 ENCOUNTER — Encounter: Payer: Self-pay | Admitting: Cardiovascular Disease

## 2023-07-04 VITALS — BP 100/62 | HR 46 | Ht 70.0 in | Wt 146.0 lb

## 2023-07-04 DIAGNOSIS — I340 Nonrheumatic mitral (valve) insufficiency: Secondary | ICD-10-CM

## 2023-07-04 DIAGNOSIS — I2510 Atherosclerotic heart disease of native coronary artery without angina pectoris: Secondary | ICD-10-CM

## 2023-07-04 DIAGNOSIS — E7800 Pure hypercholesterolemia, unspecified: Secondary | ICD-10-CM

## 2023-07-04 MED ORDER — ROSUVASTATIN CALCIUM 10 MG PO TABS
10.0000 mg | ORAL_TABLET | Freq: Every day | ORAL | 3 refills | Status: AC
  Filled 2023-07-04: qty 90, 90d supply, fill #0
  Filled 2023-09-27: qty 90, 90d supply, fill #1
  Filled 2023-12-31: qty 90, 90d supply, fill #2
  Filled 2024-03-24: qty 90, 90d supply, fill #3

## 2023-07-04 NOTE — Progress Notes (Signed)
 Chief Complaint  Patient presents with   Follow-up    Mitral regurgitation   History of Present Illness: 63 yo female with history of anemia, arthritis, hyperlipidemia and mitral valve prolapse/mitral regurgitation who is here today for cardiac follow up. I saw her as a new patient for the evaluation of her mitral valve disease in October 2023. Echo in 2012 with normal LV function and mild to moderate mitral regurgitation. She was lost to follow up in our office from 2012 to 2023. When I saw her in October 2023 she described severe dyspnea on exertion. No chest pain. She had edema in both ankles at the end of the day. Echo January 2024 with LVEF=60-65%, normal RV function. Moderate mitral regurgitation but unable to fully estimate degree of severity of the MR. Coronary CTA 03/02/22 with minimal plaque in the LAD and RCA, calcium  score of zero. She was started on a statin and ASA. I saw her in the office in February 2024 and she remained dyspneic. We discussed arranging a TEE but delayed this until she had a GI workup. She had upper endoscopy performed on 06/08/22 and was found to have mucosal changes with longitudinal furrows but no strictures. Procedure note indicates that given the endoscopic appearance and symptoms of dysphagia, esophageal dilatation was performed. I saw her in the office in August 2024 and she reported feeling much better. We decided not to perform a TEE. Echo May 2025 with LVEF=60-65%. Mild to moderate mitral regurgitation.   She is here today for follow up. The patient denies any chest pain, dyspnea, palpitations, lower extremity edema, orthopnea, PND, dizziness, near syncope or syncope.   Primary Care Physician: Estil Heman, NP   Past Medical History:  Diagnosis Date   Allergy    Anemia    Arthritis    Hyperlipidemia    Insomnia    Mitral valve prolapse    PONV (postoperative nausea and vomiting)     Past Surgical History:  Procedure Laterality Date    AUGMENTATION MAMMAPLASTY Bilateral    Dec 2019   BREAST BIOPSY Right    axillary node benign   EYE SURGERY  2010   Lasik for distance correction; use one contact in left eye for reading now   JOINT REPLACEMENT  2021; 2022; 2025   reverse shoulder on the right; anatomic replacement on the left then conversion to reverse replacement due to failed anatomic   KNEE SURGERY  1988   right   SHOULDER ARTHROSCOPY WITH LABRAL REPAIR Right 08/28/2012   Procedure: RIGHT SHOULDER ARTHROSCOPY WITH LABRAL DEBRIDEMENT, ASPIRATION AND DECOMPRESSION OF PARALABRAL CYST;  Surgeon: Glo Larch, MD;  Location: [facility];  Service: Orthopedics;  Laterality: Right;   TOTAL SHOULDER ARTHROPLASTY Right 03/24/2020   Procedure: RIGHT REVERSE SHOULDER ARTHROPLASTY;  Surgeon: Ellard Gunning, MD;  Location: [facility];  Service: Orthopedics;  Laterality: Right;    TOTAL SHOULDER ARTHROPLASTY Left 02/23/2021   Procedure: TOTAL SHOULDER ARTHROPLASTY;  Surgeon: Ellard Gunning, MD;  Location: [facility];  Service: Orthopedics;  Laterality: Left;   TOTAL SHOULDER REVISION Left 02/07/2023   Procedure: Excision Left shoulder nodule; Conversion of left anatomic shoulder arthroplasty to reverse shoulder arthroplasty;  Surgeon: Ellard Gunning, MD;  Location: [facility];  Service: Orthopedics;  Laterality: Left;    Current Outpatient Medications  Medication Sig Dispense Refill   AMBULATORY NON FORMULARY MEDICATION Medication Name: Mindful Evening Cocoa Mix (Melatonin 3 mg, Theanine 5 mg, GABA 100 mg) (Patient taking differently: Take 1  Scoop by mouth at bedtime. Medication Name: Mindful Evening Cocoa Mix (Melatonin 3 mg, Theanine 5 mg, GABA 100 mg))     aspirin  EC 81 MG tablet Take 1 tablet (81 mg total) by mouth daily. Swallow whole. 90 tablet 3   calcium  carbonate (CALCIUM  600) 600 MG TABS tablet Take 600-1,200 mg by mouth daily as needed (calcium  deficiency). Based on diet intake to achieve 1200 mg daily     Cholecalciferol (VITAMIN D ) 50  MCG (2000 UT) tablet Take 2,000 Units by mouth daily.     denosumab  (PROLIA ) 60 MG/ML SOSY injection Inject 60 mg into the skin every 6 (six) months.     Efinaconazole  10 % SOLN Apply 1 drop topically daily. 4 mL 11   hydroquinone  4 % cream Apply 1 Application topically to affected areas 2 (two) times daily. (Patient taking differently: Apply 1 Application topically daily.) 56.7 g 3   MAGNESIUM GLYCINATE PO Take 500 mg by mouth daily. Chelated, supplies 50 mg of magnesium     Multiple Vitamin (MULTIVITAMIN WITH MINERALS) TABS Take 1 tablet by mouth daily.     Omega-3 Fatty Acids (FISH OIL ) 1200 MG CAPS Take 1,200 mg by mouth daily. Provides 600 mg Omega-3 fatty acids     Psyllium Husk POWD Take 1 Dose by mouth daily. 1 dose = 1 to 2 tablespoons     rosuvastatin  (CRESTOR ) 10 MG tablet Take 1 tablet (10 mg total) by mouth daily. 90 tablet 3   tretinoin  (RETIN-A ) 0.05 % cream Apply topically at bedtime. (Patient taking differently: Apply 1 Application topically at bedtime.) 45 g 2   diphenhydrAMINE  (BENADRYL ) 50 MG tablet Pt is also to take 50 mg of benadryl  on 07/03/2023 at 1:20pm. Please call [phone] with any questions. 1 tablet 0   Efinaconazole  10 % SOLN Apply 1 drop topically daily. (Patient taking differently: Apply 1 drop topically daily as needed (Toe Nail Fungus).) 4 mL 11   predniSONE  (DELTASONE ) 50 MG tablet Pt to take 50 mg of prednisone  on 07/03/2023 at 1:20 am, 50 mg of prednisone  on 07/03/2022 at 7:20 am, and 50 mg of prednisone  on 07/03/2023 at 1:20 pm. Pt is also to take 50 mg of benadryl  on 07/03/2023 at 1:20 pm. Please call [phone] with any questions. 3 tablet 0   Current Facility-Administered Medications  Medication Dose Route Frequency Provider Last Rate Last Admin   denosumab  (PROLIA ) injection 60 mg  60 mg Subcutaneous Once Shamleffer, Ibtehal Jaralla, MD        Allergies  Allergen Reactions   Codeine Nausea And Vomiting   Hydrocodone Nausea And Vomiting   Triple  Antibiotic Pain Relief [Neomy-Bacit-Polymyx-Pramoxine] Other (See Comments)    Burned skin   Triple Antibiotic W/Hydrocortisone  [Bacitra-Neomycin-Polymyxin-Hc]     Burned skin    Contrast Media [Iodinated Contrast Media] Hives    After CT scan with contrast, pt had MRI without contrast. Pt informed MRI Tech that she had a rash on her legs. MRI Tech spoke with Radiologist after MRI was completed. Per Rad, since patient was here x 30 minutes after CT contrast and was doing fine, with rash fading, pt was allowed to leave facility. Alert and oriented. jmh    Social History   Socioeconomic History   Marital status: Married    Spouse name: Not on file   Number of children: 1   Years of education: Not on file   Highest education level: Doctorate  Occupational History   Occupation: Charity fundraiser  Tobacco Use  Smoking status: Never    Passive exposure: Never   Smokeless tobacco: Never  Vaping Use   Vaping status: Never Used  Substance and Sexual Activity   Alcohol use: No   Drug use: No   Sexual activity: Yes    Birth control/protection: Post-menopausal  Other Topics Concern   Not on file  Social History Narrative   Tobacco use, amount per day now: 0   Past tobacco use, amount per day: 0   How many years did you use tobacco: 0   Alcohol use (drinks per week): 0   Diet: Heart Healthy/DASH Diet/ Lean and Clean type foods.   Do you drink/eat things with caffeine: 1 glass of Tea in the morning.   Marital status: Married                                 What year were you married? 1987   Do you live in a house, apartment, assisted living, condo, trailer, etc.? House   Is it one or more stories? Yes   How many persons live in your home? 2 ( Husband/ Self )   Do you have pets in your home?( please list) Not currently, Dog Deceased.    Current or past profession: PhD/RN   Do you exercise?    Yes                              Type and how often? 6 Days a week.   Do you have a living will? Yes   Do  you have a DNR form?   No                               If not, do you want to discuss one? Yes   Do you have signed POA/HPOA forms? Yes                       If so, please bring to you appointment   Social Drivers of Health   Financial Resource Strain: Low Risk  (05/15/2023)   Overall Financial Resource Strain (CARDIA)    Difficulty of Paying Living Expenses: Not hard at all  Food Insecurity: No Food Insecurity (05/15/2023)   Hunger Vital Sign    Worried About Running Out of Food in the Last Year: Never true    Ran Out of Food in the Last Year: Never true  Transportation Needs: No Transportation Needs (05/15/2023)   PRAPARE - Administrator, Civil Service (Medical): No    Lack of Transportation (Non-Medical): No  Physical Activity: Sufficiently Active (05/15/2023)   Exercise Vital Sign    Days of Exercise per Week: 6 days    Minutes of Exercise per Session: 30 min  Stress: Stress Concern Present (05/15/2023)   [employer] of Occupational Health - Occupational Stress Questionnaire    Feeling of Stress : To some extent  Social Connections: Socially Integrated (05/15/2023)   Social Connection and Isolation Panel [NHANES]    Frequency of Communication with Friends and Family: More than three times a week    Frequency of Social Gatherings with Friends and Family: Twice a week    Attends Religious Services: More than 4 times per year    Active Member of [employer] or Organizations: Yes  Attends Engineer, structural: More than 4 times per year    Marital Status: Married  Catering manager Violence: Not on file    Family History  Problem Relation Age of Onset   Stroke Mother    Heart disease Mother    Hyperlipidemia Mother    Heart disease Father    Diabetes Sister    Heart disease Sister    Hyperlipidemia Sister    Hypertension Sister    Varicose Veins Sister    Diabetes Sister    Hyperlipidemia Sister    Hypertension Sister    Hyperlipidemia Sister     Hypertension Sister    Heart attack Brother 52   Diabetes Brother    Heart disease Brother    Hyperlipidemia Brother    Hypertension Brother    Sudden death Neg Hx    Breast cancer Neg Hx    Colon cancer Neg Hx    Esophageal cancer Neg Hx    Rectal cancer Neg Hx    Stomach cancer Neg Hx     Review of Systems:  As stated in the HPI and otherwise negative.   BP 100/62   Pulse (!) 46   Ht 5\' 10"  (1.778 m)   Wt 66.2 kg   LMP 08/14/2012   SpO2 95%   BMI 20.95 kg/m   Physical Examination: General: Well developed, well nourished, NAD  HEENT: OP clear, mucus membranes moist  SKIN: warm, dry. No rashes. Neuro: No focal deficits  Musculoskeletal: Muscle strength 5/5 all ext  Psychiatric: Mood and affect normal  Neck: No JVD, no carotid bruits, no thyromegaly, no lymphadenopathy.  Lungs:Clear bilaterally, no wheezes, rhonci, crackles Cardiovascular: Regular rate and rhythm. Soft systolic murmur.  Abdomen:Soft. Bowel sounds present. Non-tender.  Extremities: No lower extremity edema. Pulses are 2 + in the bilateral DP/PT.  EKG:  EKG is not ordered today. The ekg ordered today demonstrates  Recent Labs: 05/16/2023: ALT 19; BUN 16; Creat 0.50; Hemoglobin 14.9; Platelets 197; Potassium 4.3; Sodium 139; TSH 1.38   Lipid Panel    Component Value Date/Time   CHOL 156 05/16/2023 0953   TRIG 45 05/16/2023 0953   HDL 66 05/16/2023 0953   CHOLHDL 2.4 05/16/2023 0953   LDLCALC 77 05/16/2023 0953     Wt Readings from Last 3 Encounters:  07/04/23 66.2 kg  05/16/23 65.1 kg  03/01/23 65.8 kg    Assessment and Plan:   1. Mitral regurgitation: Mild to moderate MR by echo in May 2025. Will follow for now with repeat echo in one year.    2. CAD without angina: Mild CAD on coronary CTA in January 2024. No chest pain. Continue ASA and statin.    3. Hyperlipidemia: LDL 77 in March 2025. Will increase Crestor  to 10 mg daily and repeat lipids and LFTs in 12 weeks.   Labs/ tests ordered  today include:   Orders Placed This Encounter  Procedures   Lipid panel   Hepatic function panel   ECHOCARDIOGRAM COMPLETE   Disposition:   F/U with me in 12 months  Signed, Antoinette Batman, MD, [facility] 07/04/2023 11:28 AM    [facility] [address] [place], [place], [place]  [address] Phone: [phone]; Fax: [phone]

## 2023-07-04 NOTE — Patient Instructions (Signed)
 Medication Instructions:  Your physician has recommended you make the following change in your medication:  1.) increase rosuvastatin  (Crestor ) to 10 mg - one tablet daily  *If you need a refill on your cardiac medications before your next appointment, please call your pharmacy*  Lab Work: In about 12 weeks come in to the first floor [facility] for blood work (lipids, liver)   Testing/Procedures: Echo due May 2026 before next visit with Dr. Abel Hoe  Follow-Up: At [facility], you and your health needs are our priority.  As part of our continuing mission to provide you with exceptional heart care, our providers are all part of one team.  This team includes your primary Cardiologist (physician) and Advanced Practice Providers or APPs (Physician Assistants and Nurse Practitioners) who all work together to provide you with the care you need, when you need it.  Your next appointment:   12 month(s)  Provider:   Antoinette Batman, MD

## 2023-07-05 ENCOUNTER — Other Ambulatory Visit: Payer: Self-pay

## 2023-07-05 ENCOUNTER — Ambulatory Visit: Payer: Self-pay | Admitting: Family Medicine

## 2023-07-05 ENCOUNTER — Ambulatory Visit

## 2023-07-05 DIAGNOSIS — M810 Age-related osteoporosis without current pathological fracture: Secondary | ICD-10-CM

## 2023-07-05 DIAGNOSIS — D352 Benign neoplasm of pituitary gland: Secondary | ICD-10-CM

## 2023-07-05 MED ORDER — DENOSUMAB 60 MG/ML ~~LOC~~ SOSY
60.0000 mg | PREFILLED_SYRINGE | Freq: Once | SUBCUTANEOUS | Status: AC
Start: 2024-01-05 — End: ?

## 2023-07-05 NOTE — Progress Notes (Signed)
 Pt was in office today for prolia  injection given in the left arm with complication. Advise pt to wait in lobby 30 mins after injection given.

## 2023-07-06 NOTE — Telephone Encounter (Signed)
 Last Prolia  inj 07/05/23 Next Prolia  inj due 01/06/24

## 2023-07-06 NOTE — Telephone Encounter (Signed)
 [employer] Raenette Bumps  Prior Authorization for PROLIA  APPROVED  PA# [id number] Valid: 07/01/23-06/30/24

## 2023-07-08 ENCOUNTER — Encounter (HOSPITAL_BASED_OUTPATIENT_CLINIC_OR_DEPARTMENT_OTHER): Payer: Self-pay | Admitting: Physical Therapy

## 2023-07-08 ENCOUNTER — Ambulatory Visit (HOSPITAL_BASED_OUTPATIENT_CLINIC_OR_DEPARTMENT_OTHER): Admitting: Physical Therapy

## 2023-07-08 DIAGNOSIS — M25612 Stiffness of left shoulder, not elsewhere classified: Secondary | ICD-10-CM

## 2023-07-08 DIAGNOSIS — M25512 Pain in left shoulder: Secondary | ICD-10-CM | POA: Diagnosis not present

## 2023-07-08 NOTE — Therapy (Signed)
 OUTPATIENT PHYSICAL THERAPY UPPER EXTREMITY TREATMENT   Patient Name: Tara [name] MRN: [id number] DOB:[birth year], 63 y.o., female Today's Date: 07/08/2023  END OF SESSION:  PT End of Session - 07/08/23 1401     Visit Number 26    Number of Visits 39    Date for PT Re-Evaluation 07/20/23    PT Start Time 1400    PT Stop Time 1443    PT Time Calculation (min) 43 min    Activity Tolerance Patient tolerated treatment well    Behavior During Therapy [facility] for tasks assessed/performed                      Past Medical History:  Diagnosis Date   Allergy    Anemia    Arthritis    Hyperlipidemia    Insomnia    Mitral valve prolapse    PONV (postoperative nausea and vomiting)    Past Surgical History:  Procedure Laterality Date   AUGMENTATION MAMMAPLASTY Bilateral    Dec 2019   BREAST BIOPSY Right    axillary node benign   EYE SURGERY  2010   Lasik for distance correction; use one contact in left eye for reading now   JOINT REPLACEMENT  2021; 2022; 2025   reverse shoulder on the right; anatomic replacement on the left then conversion to reverse replacement due to failed anatomic   KNEE SURGERY  1988   right   SHOULDER ARTHROSCOPY WITH LABRAL REPAIR Right 08/28/2012   Procedure: RIGHT SHOULDER ARTHROSCOPY WITH LABRAL DEBRIDEMENT, ASPIRATION AND DECOMPRESSION OF PARALABRAL CYST;  Surgeon: Glo Larch, MD;  Location: [facility];  Service: Orthopedics;  Laterality: Right;   TOTAL SHOULDER ARTHROPLASTY Right 03/24/2020   Procedure: RIGHT REVERSE SHOULDER ARTHROPLASTY;  Surgeon: Ellard Gunning, MD;  Location: [facility];  Service: Orthopedics;  Laterality: Right;    TOTAL SHOULDER ARTHROPLASTY Left 02/23/2021   Procedure: TOTAL SHOULDER ARTHROPLASTY;  Surgeon: Ellard Gunning, MD;  Location: [facility];  Service: Orthopedics;  Laterality: Left;   TOTAL SHOULDER REVISION Left 02/07/2023   Procedure: Excision Left shoulder nodule; Conversion of left anatomic shoulder  arthroplasty to reverse shoulder arthroplasty;  Surgeon: Ellard Gunning, MD;  Location: [facility];  Service: Orthopedics;  Laterality: Left;   Patient Active Problem List   Diagnosis Date Noted   Onychomycosis 05/10/2023   Hypercalciuria 02/02/2023   DDD (degenerative disc disease), cervical 04/13/2021   Osteoporosis without current pathological fracture 10/28/2020   Nonallopathic lesion of sacral region 02/26/2020   Hot flashes 02/23/2019   Other insomnia 02/23/2019   Cystic acne vulgaris 02/23/2019   Bunion of right foot 02/23/2019   Varicose 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 02/23/2019   Multiple atypical nevi 02/23/2019   Mitral valve prolapse 03/03/2009    PCP: Nedra Ball [name]   REFERRING PROVIDER: Ruthy Cox Supple  REFERRING DIAG:  Diagnosis  [phone] (ICD-10-CM) - Presence of left artificial shoulder joint    THERAPY DIAG:  Stiffness of left shoulder, not elsewhere classified  Acute pain of left shoulder  Rationale for Evaluation and Treatment: Rehabilitation  ONSET DATE:  DOS: 02/07/2023  SUBJECTIVE:  SUBJECTIVE STATEMENT: Significant pain at Rt buttock/HS insertion.   From Initial Evaluation: Patient had a left standard total shoulder replacement last year.  She did well for about 6 months then began having progressive pain in the shoulder.  She had to have a shoulder revision with a reverse total shoulder.  At this time her pain is well-controlled.  She is also had a right shoulder replaced.  When she had that replaced she had significant pain.  She reports this 1 is better.  She is wearing her sling outside the house and when sleeping.  She is otherwise has been wearing her sling.  She has increased pain when she moves her arm.  She has been compliant with her HEP so far. Hand  dominance: Right  PERTINENT HISTORY: Multijoint arthritis including knees and hips.  Osteoporosis, right shoulder replacement, mitral valve prolapse  PAIN:  Are you having pain? Yes: NPRS scale: 0/10  Pain location: hips to neck Pain description: sore Aggravating factors: Use of the arm Relieving factors: Not using the arm is much  PRECAUTIONS: Shoulder  RED FLAGS: None   WEIGHT BEARING RESTRICTIONS: No  FALLS:  Has patient fallen in last 6 months? No  LIVING ENVIRONMENT:   OCCUPATION: Nurse   PLOF: Independent  PATIENT GOALS:  Patient wants to be functional   OBJECTIVE:  Note: Objective measures were completed at Evaluation unless otherwise noted.  DIAGNOSTIC FINDINGS:  Nothing post op   PATIENT SURVEYS :    COGNITION: Overall cognitive status: Within functional limits for tasks assessed     SENSATION: Denies parathesias   POSTURE: Good   UPPER EXTREMITY ROM:   Passive ROM Right eval Left eval Left 1/21 L  L  Shoulder flexion  85 106 130AROM 160PROM  148AROM supine 158PROM  Shoulder extension       Shoulder abduction     112 active supine  Shoulder adduction       Shoulder internal rotation  Can sit on body comfortably   L gute  65/L iliac crest but can get to L4 with some compensation  Shoulder external rotation  20 26  50/T3  Elbow flexion       Elbow extension       Wrist flexion       Wrist extension       Wrist ulnar deviation       Wrist radial deviation       Wrist pronation       Wrist supination       (Blank rows = not tested)  UPPER EXTREMITY MMT:  MMT Right 3/25 Left 3/25 Lt 4/22  Shoulder flexion 15.4 8.2 9.5  Shoulder extension     Shoulder abduction 9.9 6.8 7.3  Shoulder adduction     Shoulder internal rotation 11.3 8.8 6.4  Shoulder external rotation 11.5 8.1 7.8  Middle trapezius     Lower trapezius     Elbow flexion     Elbow extension     Wrist flexion     Wrist extension     Wrist ulnar deviation      Wrist radial deviation     Wrist pronation     Wrist supination     Grip strength (lbs)     (Blank rows = not tested)      PALPATION:  No unexpected TTP  TREATMENT DATE: Treatment                            5/19: Blank lines following charge title = not provided on this treatment date.   Manual:  TPDN No Self release of quadratus femoris IASTM & rolling Lt shoulder incision site There-ex: Child pose Mod pigeon pose Seated and standing hamstring stretch Add & abd machine with upright posture There-Act:  Self Care:  Nuro-Re-ed:  Gait Training:    Treatment                            5/6: Blank lines following charge title = not provided on this treatment date.   Manual:  TPDN YES Trigger Point Dry Needling  Subsequent Treatment: Instructions provided previously at initial dry needling treatment.   Patient Verbal Consent Given: Yes Education Handout Provided: Previously Provided Muscles Treated: Rt glut med, piriformis Electrical Stimulation Performed: No Treatment Response/Outcome: twitch with decreased tension Cervical traction Suboccipital release C0 on C1 Lt mobs Rt to Lt cervical mobs paired with Rt cervical sidebend There-ex:  There-Act:  Self Care: Posture eval: Lt shoulder elevation, resting in cervical right sidebend, Rt lower rib flare.  Nuro-Re-ed: Teaching laboratory technician x2 Gait Training:       PATIENT EDUCATION: Education details:  HEP, exercise form, rationale of interventions, protocol restrictions and limitations, protocol ranges, and ROM findings Person educated: Patient Education method: Explanation, Demonstration, Tactile cues, Verbal cues Education comprehension: verbalized understanding, returned demonstration, verbal cues required, tactile cues required, and needs further education  HOME EXERCISE  PROGRAM: Access Code: [email or web address] URL: [email or web address]   ASSESSMENT:  CLINICAL IMPRESSION: Time taken again today to discuss cross pattern and full body biomechanical chain involvement. Noted limitation in right hip and left shoulder in child pose. Decrease in pain in session and will determine effectiveness with riding in the car- encouraged trial of lumbar support via pillow.    OBJECTIVE IMPAIRMENTS:  decreased ROM, decreased strength, impaired UE functional use, and pain.   ACTIVITY LIMITATIONS: carrying, lifting, sleeping, bathing, toileting, dressing, self feeding, reach over head, and hygiene/grooming  PARTICIPATION LIMITATIONS: meal prep, cleaning, laundry, driving, shopping, community activity, occupation, and yard work  PERSONAL FACTORS: 1-2 comorbidities: previous replacement on the same shoulder osteoporosis; multi joint OA  are also affecting patient's functional outcome.   REHAB POTENTIAL: Good  CLINICAL DECISION MAKING: Stable/uncomplicated  EVALUATION COMPLEXITY: Low  GOALS: Goals reviewed with patient? Yes  SHORT TERM GOALS: Target date: 04/16/2023    Patient will increase passive flexion to 115 degrees  Baseline: Goal status: MET  2.  Patient will increase left shoulder  ER to 30 degrees  Baseline:  Goal status: MET  3.  Patient will use her left arm for ADL's below 90 degrees with > 2/10 pain  Baseline:  Goal status: MET   LONG TERM GOALS: Target date: POC DATE    The patient will reach overhead with left arm without pain in order to perform ADL's  Baseline:  Goal status: IN PROGRESS  2.  Patient will each behind her head without pain in order to per from her ADL's  Baseline:  Goal status: IN PROGRESS  3.  Patient will reach behind her back to L3 without pain in order to perform ADL's  Baseline:  Goal status: IN PROGRESS L4 on 3/25  4. Able to demo overhead activity without scapular winging bilaterally  Status: New  5.  Strength available for ADLs with weight/resistance such as washing dishes, vacuuming   Status: New 6. Feel prepared to lift grand children when I have them.   Status: New   PLAN: PT FREQUENCY: 2x/week  PT DURATION: 8 weeks  PLANNED INTERVENTIONS: 97110-Therapeutic exercises, 97530- Therapeutic activity, [id number]- Neuromuscular re-education, 97535- Self Care, [id number]- Manual therapy, 7113- Aquatic Therapy, 97014- Electrical stimulation (unattended), 97035- Ultrasound, Patient/Family education, Taping, Dry Needling, DME instructions, Cryotherapy, and Moist heat   PLAN FOR NEXT SESSION:  Cont per Dr. Leandro Proffer Reverse total shoulder protocol ( will be on Dave's desk )    Steph Cheadle C. Torra Pala PT, DPT 07/08/23 4:15 PM

## 2023-07-12 ENCOUNTER — Encounter: Attending: Internal Medicine | Admitting: Dietician

## 2023-07-12 DIAGNOSIS — Z713 Dietary counseling and surveillance: Secondary | ICD-10-CM | POA: Diagnosis not present

## 2023-07-12 NOTE — Progress Notes (Signed)
 Medical Nutrition Therapy  Appointment Start time:  1340  Appointment End time:  1435 Patient is here today alone.  She was last seen by this RD 11/23/2022.  She states that she has learned from the [employer] program information from [facility] provided at her last visit and has participated in the online programs and participated in the LYFT project which is a series on overall well being and mental health. She states that she is eating similarly and states that she is feeling quite food obsessed and finishes each meal with a PB powder.  She is exercising for bone health and strength but also due to balancing overeating PB powder. Weight is stable.  Patient states that she feels best <140 lbs.  She states that she is unable to stop eating the PB powder at this time. She continues to have problems waking in the middle of the night and not being able to return to sleep.  She is taking magnesium and other supplements including melatonin but this continues.  Her sleep hygiene is very good.  Exercises in the am. Continues to have problems with menopause  Primary concerns today: wants to know how to not have to work so hard to keep weight undercontrol, hypercalciuria,  tips to get her body in the best shape for shoulder surgery. She discussed that since menopause, she gets nauseous when she gets hot flashes and eating helps prevent this.  She states that she may eat at times to prevent this rather than hunger.  She states that she feels like she is exercising too much to maintain her weight and lacks time to do other things she enjoys. Referral diagnosis: [employer] Employee - no referral required Preferred learning style: no preference indicated Learning readiness: ready, change in progress)  NUTRITION ASSESSMENT  Anthropometrics 70" 143 lbs 07/12/2023 144 lbs 11/23/2022 156 lbs 2015 Feels best (joints) when her weight is <140 lbs  Clinical Medical Hx: osteoarthritis, osteoporosis,  hypercalciuria, HLD Medications: see list to include crestor  Labs: Urine Calcium  334 on 11/2021, Vitamin D  32 on 10/25023, Vitamin B-12 744 on 12/25/2021 Notable Signs/Symptoms: Joints hurt and body hurts when she gets in the higher 140 range.  Lifestyle & Dietary Hx She is a [employer].  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  Daily fluid intake: adequate Supplements: psyillium in unsweetened almond milk, MVI, Vitamin D , Omega 3, Calcium , mindful cocoa (contains melatonin) Sleep: 4 hours broken  Stress / self-care: high stress, good self care Current average weekly physical activity: M, W, F - free weights for 30 minutes or more and 15 minutes of pilaties, restorative yoga on Fridays, running Tuesday and Thursday 3-4 miles and Saturday 6 miles. She has now increased her running 2 miles per day that she runs and light weights on M, W, F.  24-Hr Dietary Recall No dairy, no animal protein (Dr. suggested avoiding added salt, dairy and animal protein due to hypercalciuria.)  Will eat bread that she makes of Ezekiel bread. First Meal: PB powder (reconstituted with cocoa powder and water ),high fiber wrap with avocado, spinach, apple, broccoli, carrots, hummus Snack: roasted chick peas, hummus, apple, PB powder Second Meal: wild rice, kale, spinach, black bean, corn, onion bowl with a cranberry honey mustard dressing, nuts, avocado, PB powder Snack: banana and PB powder on wassa crackers Third Meal: leftovers from lunch, protein shake Snack: peanuts, sleepy cocoa supplement and PB powder Beverages: water , Ripple  milk mixed with cocoa, almond milk, Bubbly, herbal tea  Estimated Energy Needs Calories: 1900-2100 Protein: 85-95g  NUTRITION DIAGNOSIS  NB-1.1 Food and nutrition-related knowledge deficit As related to balance of carbohydrate, protein, and fat.  As evidenced by diet hx and patient report.   NUTRITION INTERVENTION   Nutrition education (E-1) on the following topics:  Review of current intake  Discussed her supplement use Discussed sleep  Discussed nutrition adequacy  Handouts Provided Include  [facility] [employer] developer of Lifestyle Medicine) packet  Learning Style & Readiness for Change Teaching method utilized: Visual & Auditory  Demonstrated degree of understanding via: Teach Back  Barriers to learning/adherence to lifestyle change: none  Goals Established by Pt Continue to eat mindfully Swaps for PB powder? Or ways to decrease portion size and feel satisfied.  Maybe you need more carbohydrate or protein in the meal or something else after some meals? Consider eating for a while (30 days?) without purposeful restriction, paying attention to how your body feels, and not using judgement regarding food.  How do you feel?  Where did you land.   Continue a whole foods, plant based diet You are continuing to obtain adequate protein Consider supplements such as Magnesium Glycinate (PURE) Continue to stay active  MONITORING & EVALUATION Dietary intake, weekly physical activity prn  Next Steps  Patient is to call for questions.

## 2023-07-12 NOTE — Patient Instructions (Addendum)
 Continue to eat mindfully  Swaps for PB powder? Or ways to decrease portion size and feel satisfied.  Maybe you need more carbohydrate or protein in the meal or something else after some meals? Consider eating for a while (30 days?) without purposeful restriction, paying attention to how your body feels, and not using judgement regarding food.  How do you feel?  Where did you land.   Continue a whole foods, plant based diet You are continuing to obtain adequate protein Consider supplements such as Magnesium Glycinate (PURE) Continue to stay active

## 2023-07-16 ENCOUNTER — Other Ambulatory Visit: Payer: Self-pay

## 2023-07-16 DIAGNOSIS — M5030 Other cervical disc degeneration, unspecified cervical region: Secondary | ICD-10-CM

## 2023-07-17 ENCOUNTER — Other Ambulatory Visit (HOSPITAL_COMMUNITY): Payer: Self-pay

## 2023-07-19 ENCOUNTER — Ambulatory Visit (HOSPITAL_BASED_OUTPATIENT_CLINIC_OR_DEPARTMENT_OTHER): Admitting: Physical Therapy

## 2023-07-19 DIAGNOSIS — M25612 Stiffness of left shoulder, not elsewhere classified: Secondary | ICD-10-CM

## 2023-07-19 DIAGNOSIS — M25512 Pain in left shoulder: Secondary | ICD-10-CM

## 2023-07-19 NOTE — Therapy (Signed)
 OUTPATIENT PHYSICAL THERAPY UPPER EXTREMITY TREATMENT   Patient Name: Tara [name] MRN: [id number] DOB:[birth year], 63 y.o., female Today's Date: 07/19/2023  END OF SESSION:  PT End of Session - 07/19/23 1350     Visit Number 27    Number of Visits 39    Date for PT Re-Evaluation 09/19/23    PT Start Time 1350    PT Stop Time 1433    PT Time Calculation (min) 43 min    Activity Tolerance Patient tolerated treatment well    Behavior During Therapy [facility] for tasks assessed/performed                       Past Medical History:  Diagnosis Date   Allergy    Anemia    Arthritis    Hyperlipidemia    Insomnia    Mitral valve prolapse    PONV (postoperative nausea and vomiting)    Past Surgical History:  Procedure Laterality Date   AUGMENTATION MAMMAPLASTY Bilateral    Dec 2019   BREAST BIOPSY Right    axillary node benign   EYE SURGERY  2010   Lasik for distance correction; use one contact in left eye for reading now   JOINT REPLACEMENT  2021; 2022; 2025   reverse shoulder on the right; anatomic replacement on the left then conversion to reverse replacement due to failed anatomic   KNEE SURGERY  1988   right   SHOULDER ARTHROSCOPY WITH LABRAL REPAIR Right 08/28/2012   Procedure: RIGHT SHOULDER ARTHROSCOPY WITH LABRAL DEBRIDEMENT, ASPIRATION AND DECOMPRESSION OF PARALABRAL CYST;  Surgeon: Glo Larch, MD;  Location: [facility] OR;  Service: Orthopedics;  Laterality: Right;   TOTAL SHOULDER ARTHROPLASTY Right 03/24/2020   Procedure: RIGHT REVERSE SHOULDER ARTHROPLASTY;  Surgeon: Ellard Gunning, MD;  Location: [facility];  Service: Orthopedics;  Laterality: Right;    TOTAL SHOULDER ARTHROPLASTY Left 02/23/2021   Procedure: TOTAL SHOULDER ARTHROPLASTY;  Surgeon: Ellard Gunning, MD;  Location: [facility];  Service: Orthopedics;  Laterality: Left;   TOTAL SHOULDER REVISION Left 02/07/2023   Procedure: Excision Left shoulder nodule; Conversion of left anatomic shoulder  arthroplasty to reverse shoulder arthroplasty;  Surgeon: Ellard Gunning, MD;  Location: [facility];  Service: Orthopedics;  Laterality: Left;   Patient Active Problem List   Diagnosis Date Noted   Onychomycosis 05/10/2023   Hypercalciuria 02/02/2023   DDD (degenerative disc disease), cervical 04/13/2021   Osteoporosis without current pathological fracture 10/28/2020   Nonallopathic lesion of sacral region 02/26/2020   Hot flashes 02/23/2019   Other insomnia 02/23/2019   Cystic acne vulgaris 02/23/2019   Bunion of right foot 02/23/2019   Varicose 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 02/23/2019   Multiple atypical nevi 02/23/2019   Mitral valve prolapse 03/03/2009    PCP: Nedra Ball [name]   REFERRING PROVIDER: Ruthy Cox Supple  REFERRING DIAG:  Diagnosis  [id number] (ICD-10-CM) - Presence of left artificial shoulder joint    THERAPY DIAG:  Stiffness of left shoulder, not elsewhere classified  Acute pain of left shoulder  Rationale for Evaluation and Treatment: Rehabilitation  ONSET DATE:  DOS: 02/07/2023  SUBJECTIVE:  SUBJECTIVE STATEMENT: Made progress at HS insertion area but coronal glutes are hurting- even the slightest incline is painful. It jumps from glutes to hamstrings and lower back gets stiff when I am standing. Working to keep right hip back. IASTM to incision was helpful but retraction is still very intense.    From Initial Evaluation: Patient had a left standard total shoulder replacement last year.  She did well for about 6 months then began having progressive pain in the shoulder.  She had to have a shoulder revision with a reverse total shoulder.  At this time her pain is well-controlled.  She is also had a right shoulder replaced.  When she had that replaced she had significant  pain.  She reports this 1 is better.  She is wearing her sling outside the house and when sleeping.  She is otherwise has been wearing her sling.  She has increased pain when she moves her arm.  She has been compliant with her HEP so far. Hand dominance: Right  PERTINENT HISTORY: Multijoint arthritis including knees and hips.  Osteoporosis, right shoulder replacement, mitral valve prolapse  PAIN:  Are you having pain? Yes: NPRS scale: 0/10  Pain location: hips to neck Pain description: sore Aggravating factors: Use of the arm Relieving factors: Not using the arm is much  PRECAUTIONS: Shoulder  RED FLAGS: None   WEIGHT BEARING RESTRICTIONS: No  FALLS:  Has patient fallen in last 6 months? No  LIVING ENVIRONMENT:   OCCUPATION: Nurse   PLOF: Independent  PATIENT GOALS:  Patient wants to be functional   OBJECTIVE:  Note: Objective measures were completed at Evaluation unless otherwise noted.  DIAGNOSTIC FINDINGS:  Nothing post op   PATIENT SURVEYS :    COGNITION: Overall cognitive status: Within functional limits for tasks assessed     SENSATION: Denies parathesias   POSTURE: Good   UPPER EXTREMITY ROM:   Passive ROM Right eval Left eval Left 1/21 L  L  Shoulder flexion  85 106 130AROM 160PROM  148AROM supine 158PROM  Shoulder extension       Shoulder abduction     112 active supine  Shoulder adduction       Shoulder internal rotation  Can sit on body comfortably   L gute  65/L iliac crest but can get to L4 with some compensation  Shoulder external rotation  20 26  50/T3  Elbow flexion       Elbow extension       Wrist flexion       Wrist extension       Wrist ulnar deviation       Wrist radial deviation       Wrist pronation       Wrist supination       (Blank rows = not tested)  UPPER EXTREMITY MMT:  MMT Right 3/25 Left 3/25 Lt 4/22  Shoulder flexion 15.4 8.2 9.5  Shoulder extension     Shoulder abduction 9.9 6.8 7.3  Shoulder  adduction     Shoulder internal rotation 11.3 8.8 6.4  Shoulder external rotation 11.5 8.1 7.8  Middle trapezius     Lower trapezius     Elbow flexion     Elbow extension     Wrist flexion     Wrist extension     Wrist ulnar deviation     Wrist radial deviation     Wrist pronation     Wrist supination     Grip strength (lbs)     (  Blank rows = not tested)      PALPATION:  No unexpected TTP                                                                                                                              TREATMENT DATE: Treatment                            5/30: Blank lines following charge title = not provided on this treatment date.   Manual:  TPDN No IASTM bil GHJ incision Post rotation from Rt ASIS There-ex: Wide stance bridge with 10lb & abd press Supine horiz abd green tband Chest plank green tband- alt hip ext with glut set to try to lift. Iso row hold 3lb There-Act:  Self Care:  Nuro-Re-ed: Glut engagement in standing- coronal v caudal Gait Training:   Treatment                            5/19: Blank lines following charge title = not provided on this treatment date.   Manual:  TPDN No Self release of quadratus femoris IASTM & rolling Lt shoulder incision site There-ex: Child pose Mod pigeon pose Seated and standing hamstring stretch Add & abd machine with upright posture There-Act:  Self Care:  Nuro-Re-ed:  Gait Training:    Treatment                            5/6: Blank lines following charge title = not provided on this treatment date.   Manual:  TPDN YES Trigger Point Dry Needling  Subsequent Treatment: Instructions provided previously at initial dry needling treatment.   Patient Verbal Consent Given: Yes Education Handout Provided: Previously Provided Muscles Treated: Rt glut med, piriformis Electrical Stimulation Performed: No Treatment Response/Outcome: twitch with decreased tension Cervical traction Suboccipital  release C0 on C1 Lt mobs Rt to Lt cervical mobs paired with Rt cervical sidebend There-ex:  There-Act:  Self Care: Posture eval: Lt shoulder elevation, resting in cervical right sidebend, Rt lower rib flare.  Nuro-Re-ed: Teaching laboratory technician x2 Gait Training:       PATIENT EDUCATION: Education details:  HEP, exercise form, rationale of interventions, protocol restrictions and limitations, protocol ranges, and ROM findings Person educated: Patient Education method: Explanation, Demonstration, Tactile cues, Verbal cues Education comprehension: verbalized understanding, returned demonstration, verbal cues required, tactile cues required, and needs further education  HOME EXERCISE PROGRAM: Access Code: [email or web address] URL: [email or web address]   ASSESSMENT:  CLINICAL IMPRESSION: Tightness in bil incisions resulting in limited anterior fascial mobility and providing tension to scap retraction. Will work on coronal glut activation with core tightness to support posture.    OBJECTIVE IMPAIRMENTS:  decreased ROM, decreased strength, impaired UE functional use, and pain.   ACTIVITY LIMITATIONS: carrying, lifting, sleeping, bathing, toileting, dressing, self feeding, reach over head,  and hygiene/grooming  PARTICIPATION LIMITATIONS: meal prep, cleaning, laundry, driving, shopping, community activity, occupation, and yard work  PERSONAL FACTORS: 1-2 comorbidities: previous replacement on the same shoulder osteoporosis; multi joint OA  are also affecting patient's functional outcome.   REHAB POTENTIAL: Good  CLINICAL DECISION MAKING: Stable/uncomplicated  EVALUATION COMPLEXITY: Low  GOALS: Goals reviewed with patient? Yes  SHORT TERM GOALS: Target date: 04/16/2023    Patient will increase passive flexion to 115 degrees  Baseline: Goal status: MET  2.  Patient will increase left shoulder  ER to 30 degrees  Baseline:  Goal status: MET  3.  Patient will use her left  arm for ADL's below 90 degrees with > 2/10 pain  Baseline:  Goal status: MET   LONG TERM GOALS: Target date: POC DATE    The patient will reach overhead with left arm without pain in order to perform ADL's  Baseline:  Goal status: IN PROGRESS  2.  Patient will each behind her head without pain in order to per from her ADL's  Baseline:  Goal status: IN PROGRESS  3.  Patient will reach behind her back to L3 without pain in order to perform ADL's  Baseline:  Goal status: IN PROGRESS L4 on 3/25  4. Able to demo overhead activity without scapular winging bilaterally  Status: New 5. Strength available for ADLs with weight/resistance such as washing dishes, vacuuming   Status: New 6. Feel prepared to lift grand children when I have them.   Status: New   PLAN: PT FREQUENCY: 2x/week  PT DURATION: 8 weeks  PLANNED INTERVENTIONS: 97110-Therapeutic exercises, 97530- Therapeutic activity, [id number]- Neuromuscular re-education, 97535- Self Care, 32440- Manual therapy, 7113- Aquatic Therapy, 97014- Electrical stimulation (unattended), 97035- Ultrasound, Patient/Family education, Taping, Dry Needling, DME instructions, Cryotherapy, and Moist heat   PLAN FOR NEXT SESSION:  Cont per Dr. Leandro Proffer Reverse total shoulder protocol ( will be on Dave's desk )    Lalitha Ilyas C. Abbas Beyene PT, DPT 07/19/23 3:59 PM

## 2023-07-22 ENCOUNTER — Ambulatory Visit (HOSPITAL_BASED_OUTPATIENT_CLINIC_OR_DEPARTMENT_OTHER): Attending: Orthopedic Surgery | Admitting: Physical Therapy

## 2023-07-22 ENCOUNTER — Encounter (HOSPITAL_BASED_OUTPATIENT_CLINIC_OR_DEPARTMENT_OTHER): Payer: Self-pay | Admitting: Physical Therapy

## 2023-07-22 DIAGNOSIS — M25612 Stiffness of left shoulder, not elsewhere classified: Secondary | ICD-10-CM | POA: Diagnosis not present

## 2023-07-22 DIAGNOSIS — M25512 Pain in left shoulder: Secondary | ICD-10-CM | POA: Insufficient documentation

## 2023-07-22 NOTE — Therapy (Signed)
 OUTPATIENT PHYSICAL THERAPY UPPER EXTREMITY TREATMENT   Patient Name: Tara [name] MRN: [id number] DOB:[birth year], 63 y.o., female Today's Date: 07/22/2023  END OF SESSION:  PT End of Session - 07/22/23 1401     Visit Number 28    Number of Visits 39    Date for PT Re-Evaluation 09/19/23    PT Start Time 1320    PT Stop Time 1400    PT Time Calculation (min) 40 min    Activity Tolerance Patient tolerated treatment well    Behavior During Therapy [facility] for tasks assessed/performed                        Past Medical History:  Diagnosis Date   Allergy    Anemia    Arthritis    Hyperlipidemia    Insomnia    Mitral valve prolapse    PONV (postoperative nausea and vomiting)    Past Surgical History:  Procedure Laterality Date   AUGMENTATION MAMMAPLASTY Bilateral    Dec 2019   BREAST BIOPSY Right    axillary node benign   EYE SURGERY  2010   Lasik for distance correction; use one contact in left eye for reading now   JOINT REPLACEMENT  2021; 2022; 2025   reverse shoulder on the right; anatomic replacement on the left then conversion to reverse replacement due to failed anatomic   KNEE SURGERY  1988   right   SHOULDER ARTHROSCOPY WITH LABRAL REPAIR Right 08/28/2012   Procedure: RIGHT SHOULDER ARTHROSCOPY WITH LABRAL DEBRIDEMENT, ASPIRATION AND DECOMPRESSION OF PARALABRAL CYST;  Surgeon: Glo Larch, MD;  Location: [facility];  Service: Orthopedics;  Laterality: Right;   TOTAL SHOULDER ARTHROPLASTY Right 03/24/2020   Procedure: RIGHT REVERSE SHOULDER ARTHROPLASTY;  Surgeon: Ellard Gunning, MD;  Location: [facility];  Service: Orthopedics;  Laterality: Right;    TOTAL SHOULDER ARTHROPLASTY Left 02/23/2021   Procedure: TOTAL SHOULDER ARTHROPLASTY;  Surgeon: Ellard Gunning, MD;  Location: [facility];  Service: Orthopedics;  Laterality: Left;   TOTAL SHOULDER REVISION Left 02/07/2023   Procedure: Excision Left shoulder nodule; Conversion of left anatomic shoulder  arthroplasty to reverse shoulder arthroplasty;  Surgeon: Ellard Gunning, MD;  Location: [facility];  Service: Orthopedics;  Laterality: Left;   Patient Active Problem List   Diagnosis Date Noted   Onychomycosis 05/10/2023   Hypercalciuria 02/02/2023   DDD (degenerative disc disease), cervical 04/13/2021   Osteoporosis without current pathological fracture 10/28/2020   Nonallopathic lesion of sacral region 02/26/2020   Hot flashes 02/23/2019   Other insomnia 02/23/2019   Cystic acne vulgaris 02/23/2019   Bunion of right foot 02/23/2019   Varicose 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 02/23/2019   Multiple atypical nevi 02/23/2019   Mitral valve prolapse 03/03/2009    PCP: Nedra Ball [name]   REFERRING PROVIDER: Ruthy Cox Supple  REFERRING DIAG:  Diagnosis  [phone] (ICD-10-CM) - Presence of left artificial shoulder joint    THERAPY DIAG:  Stiffness of left shoulder, not elsewhere classified  Acute pain of left shoulder  Rationale for Evaluation and Treatment: Rehabilitation  ONSET DATE:  DOS: 02/07/2023  SUBJECTIVE:  SUBJECTIVE STATEMENT: Still feel the Rt SIJ and into HS when sitting in work chair.    From Initial Evaluation: Patient had a left standard total shoulder replacement last year.  She did well for about 6 months then began having progressive pain in the shoulder.  She had to have a shoulder revision with a reverse total shoulder.  At this time her pain is well-controlled.  She is also had a right shoulder replaced.  When she had that replaced she had significant pain.  She reports this 1 is better.  She is wearing her sling outside the house and when sleeping.  She is otherwise has been wearing her sling.  She has increased pain when she moves her arm.  She has been compliant with her  HEP so far. Hand dominance: Right  PERTINENT HISTORY: Multijoint arthritis including knees and hips.  Osteoporosis, right shoulder replacement, mitral valve prolapse  PAIN:  Are you having pain? Yes: NPRS scale: 0/10  Pain location: hips to neck Pain description: sore Aggravating factors: Use of the arm Relieving factors: Not using the arm is much  PRECAUTIONS: Shoulder  RED FLAGS: None   WEIGHT BEARING RESTRICTIONS: No  FALLS:  Has patient fallen in last 6 months? No  LIVING ENVIRONMENT:   OCCUPATION: Nurse   PLOF: Independent  PATIENT GOALS:  Patient wants to be functional   OBJECTIVE:  Note: Objective measures were completed at Evaluation unless otherwise noted.  DIAGNOSTIC FINDINGS:  Nothing post op   PATIENT SURVEYS :    COGNITION: Overall cognitive status: Within functional limits for tasks assessed     SENSATION: Denies parathesias   POSTURE: Good   UPPER EXTREMITY ROM:   Passive ROM Right eval Left eval Left 1/21 L  L  Shoulder flexion  85 106 130AROM 160PROM  148AROM supine 158PROM  Shoulder extension       Shoulder abduction     112 active supine  Shoulder adduction       Shoulder internal rotation  Can sit on body comfortably   L gute  65/L iliac crest but can get to L4 with some compensation  Shoulder external rotation  20 26  50/T3  Elbow flexion       Elbow extension       Wrist flexion       Wrist extension       Wrist ulnar deviation       Wrist radial deviation       Wrist pronation       Wrist supination       (Blank rows = not tested)  UPPER EXTREMITY MMT:  MMT Right 3/25 Left 3/25 Lt 4/22  Shoulder flexion 15.4 8.2 9.5  Shoulder extension     Shoulder abduction 9.9 6.8 7.3  Shoulder adduction     Shoulder internal rotation 11.3 8.8 6.4  Shoulder external rotation 11.5 8.1 7.8  Middle trapezius     Lower trapezius     Elbow flexion     Elbow extension     Wrist flexion     Wrist extension     Wrist ulnar  deviation     Wrist radial deviation     Wrist pronation     Wrist supination     Grip strength (lbs)     (Blank rows = not tested)      PALPATION:  No unexpected TTP  TREATMENT DATE: Treatment                            6/2: Blank lines following charge title = not provided on this treatment date.   Manual:  TPDN No Hesch self correction L5 & S1- with and without PT mobs to L5 (pt rotates Lt) There-ex: LTR with deep breath Lunge and kneeling chops yellow tband Half kneel on Rt- working to reduce thoracic dextroscoliosis Plank on table oblique knee drive to hip extension There-Act:  Self Care:  Nuro-Re-ed:  Gait Training:    Treatment                            5/30: Blank lines following charge title = not provided on this treatment date.   Manual:  TPDN No IASTM bil GHJ incision Post rotation from Rt ASIS There-ex: Wide stance bridge with 10lb & abd press Supine horiz abd green tband Chest plank green tband- alt hip ext with glut set to try to lift. Iso row hold 3lb There-Act:  Self Care:  Nuro-Re-ed: Glut engagement in standing- coronal v caudal Gait Training:   Treatment                            5/19: Blank lines following charge title = not provided on this treatment date.   Manual:  TPDN No Self release of quadratus femoris IASTM & rolling Lt shoulder incision site There-ex: Child pose Mod pigeon pose Seated and standing hamstring stretch Add & abd machine with upright posture There-Act:  Self Care:  Nuro-Re-ed:  Gait Training:    PATIENT EDUCATION: Education details:  HEP, exercise form, rationale of interventions, protocol restrictions and limitations, protocol ranges, and ROM findings Person educated: Patient Education method: Explanation, Demonstration, Tactile cues, Verbal cues Education  comprehension: verbalized understanding, returned demonstration, verbal cues required, tactile cues required, and needs further education  HOME EXERCISE PROGRAM: Access Code: [email or web address] URL: [email or web address]   ASSESSMENT:  CLINICAL IMPRESSION: Notable lack of activation in Rt obliques and hip abductors to provide upright support. In half kneel, demo thoracic dextroscoliosis, also demo poor Lt trunk rotation vs Rt. These deviations are resulting in lack of Lt scapular mobility and shoulder pain- will continue to address full biomechanical chain to meet long term goals.    OBJECTIVE IMPAIRMENTS:  decreased ROM, decreased strength, impaired UE functional use, and pain.   ACTIVITY LIMITATIONS: carrying, lifting, sleeping, bathing, toileting, dressing, self feeding, reach over head, and hygiene/grooming  PARTICIPATION LIMITATIONS: meal prep, cleaning, laundry, driving, shopping, community activity, occupation, and yard work  PERSONAL FACTORS: 1-2 comorbidities: previous replacement on the same shoulder osteoporosis; multi joint OA  are also affecting patient's functional outcome.   REHAB POTENTIAL: Good  CLINICAL DECISION MAKING: Stable/uncomplicated  EVALUATION COMPLEXITY: Low  GOALS: Goals reviewed with patient? Yes  SHORT TERM GOALS: Target date: 04/16/2023    Patient will increase passive flexion to 115 degrees  Baseline: Goal status: MET  2.  Patient will increase left shoulder  ER to 30 degrees  Baseline:  Goal status: MET  3.  Patient will use her left arm for ADL's below 90 degrees with > 2/10 pain  Baseline:  Goal status: MET   LONG TERM GOALS: Target date: POC DATE    The patient will reach overhead with left arm without pain in order to perform ADL's  Baseline:  Goal status: IN PROGRESS  2.  Patient will each behind her head without pain in order to per from her ADL's  Baseline:  Goal status: IN PROGRESS  3.  Patient will reach behind  her back to L3 without pain in order to perform ADL's  Baseline:  Goal status: IN PROGRESS L4 on 3/25  4. Able to demo overhead activity without scapular winging bilaterally  Status: New 5. Strength available for ADLs with weight/resistance such as washing dishes, vacuuming   Status: New 6. Feel prepared to lift grand children when I have them.   Status: New   PLAN: PT FREQUENCY: 2x/week  PT DURATION: 8 weeks  PLANNED INTERVENTIONS: 97110-Therapeutic exercises, 97530- Therapeutic activity, [id number]- Neuromuscular re-education, 97535- Self Care, 16109- Manual therapy, 7113- Aquatic Therapy, 97014- Electrical stimulation (unattended), 97035- Ultrasound, Patient/Family education, Taping, Dry Needling, DME instructions, Cryotherapy, and Moist heat   PLAN FOR NEXT SESSION:  Cont per Dr. Leandro Proffer Reverse total shoulder protocol ( will be on Dave's desk )    Payton Prinsen C. Aloma Boch PT, DPT 07/22/23 4:59 PM

## 2023-07-29 NOTE — Therapy (Signed)
 OUTPATIENT PHYSICAL THERAPY UPPER EXTREMITY TREATMENT   Patient Name: Tara [name] MRN: [id number] DOB:[birth year], 63 y.o., female Today's Date: 07/29/2023  END OF SESSION:               Past Medical History:  Diagnosis Date   Allergy    Anemia    Arthritis    Hyperlipidemia    Insomnia    Mitral valve prolapse    PONV (postoperative nausea and vomiting)    Past Surgical History:  Procedure Laterality Date   AUGMENTATION MAMMAPLASTY Bilateral    Dec 2019   BREAST BIOPSY Right    axillary node benign   EYE SURGERY  2010   Lasik for distance correction; use one contact in left eye for reading now   JOINT REPLACEMENT  2021; 2022; 2025   reverse shoulder on the right; anatomic replacement on the left then conversion to reverse replacement due to failed anatomic   KNEE SURGERY  1988   right   SHOULDER ARTHROSCOPY WITH LABRAL REPAIR Right 08/28/2012   Procedure: RIGHT SHOULDER ARTHROSCOPY WITH LABRAL DEBRIDEMENT, ASPIRATION AND DECOMPRESSION OF PARALABRAL CYST;  Surgeon: Glo Larch, MD;  Location: [facility];  Service: Orthopedics;  Laterality: Right;   TOTAL SHOULDER ARTHROPLASTY Right 03/24/2020   Procedure: RIGHT REVERSE SHOULDER ARTHROPLASTY;  Surgeon: Ellard Gunning, MD;  Location: [facility];  Service: Orthopedics;  Laterality: Right;    TOTAL SHOULDER ARTHROPLASTY Left 02/23/2021   Procedure: TOTAL SHOULDER ARTHROPLASTY;  Surgeon: Ellard Gunning, MD;  Location: [facility];  Service: Orthopedics;  Laterality: Left;   TOTAL SHOULDER REVISION Left 02/07/2023   Procedure: Excision Left shoulder nodule; Conversion of left anatomic shoulder arthroplasty to reverse shoulder arthroplasty;  Surgeon: Ellard Gunning, MD;  Location: [facility];  Service: Orthopedics;  Laterality: Left;   Patient Active Problem List   Diagnosis Date Noted   Onychomycosis 05/10/2023   Hypercalciuria 02/02/2023   DDD (degenerative disc disease), cervical 04/13/2021   Osteoporosis without  current pathological fracture 10/28/2020   Nonallopathic lesion of sacral region 02/26/2020   Hot flashes 02/23/2019   Other insomnia 02/23/2019   Cystic acne vulgaris 02/23/2019   Bunion of right foot 02/23/2019   Varicose 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 02/23/2019   Multiple atypical nevi 02/23/2019   Mitral valve prolapse 03/03/2009    PCP: Nedra Ball [name]   REFERRING PROVIDER: Ruthy Cox Supple  REFERRING DIAG:  Diagnosis  [phone] (ICD-10-CM) - Presence of left artificial shoulder joint    THERAPY DIAG:  No diagnosis found.  Rationale for Evaluation and Treatment: Rehabilitation  ONSET DATE:  DOS: 02/07/2023  SUBJECTIVE:  SUBJECTIVE STATEMENT: ***   From Initial Evaluation: Patient had a left standard total shoulder replacement last year.  She did well for about 6 months then began having progressive pain in the shoulder.  She had to have a shoulder revision with a reverse total shoulder.  At this time her pain is well-controlled.  She is also had a right shoulder replaced.  When she had that replaced she had significant pain.  She reports this 1 is better.  She is wearing her sling outside the house and when sleeping.  She is otherwise has been wearing her sling.  She has increased pain when she moves her arm.  She has been compliant with her HEP so far. Hand dominance: Right  PERTINENT HISTORY: Multijoint arthritis including knees and hips.  Osteoporosis, right shoulder replacement, mitral valve prolapse  PAIN:  Are you having pain? Yes: NPRS scale: 0/10  Pain location: hips to neck Pain description: sore Aggravating factors: Use of the arm Relieving factors: Not using the arm is much  PRECAUTIONS: Shoulder  RED FLAGS: None   WEIGHT BEARING RESTRICTIONS: No  FALLS:   Has patient fallen in last 6 months? No  LIVING ENVIRONMENT:   OCCUPATION: Nurse   PLOF: Independent  PATIENT GOALS:  Patient wants to be functional   OBJECTIVE:  Note: Objective measures were completed at Evaluation unless otherwise noted.  DIAGNOSTIC FINDINGS:  Nothing post op   PATIENT SURVEYS :    COGNITION: Overall cognitive status: Within functional limits for tasks assessed     SENSATION: Denies parathesias   POSTURE: Good   UPPER EXTREMITY ROM:   Passive ROM Right eval Left eval Left 1/21 L  L  Shoulder flexion  85 106 130AROM 160PROM  148AROM supine 158PROM  Shoulder extension       Shoulder abduction     112 active supine  Shoulder adduction       Shoulder internal rotation  Can sit on body comfortably   L gute  65/L iliac crest but can get to L4 with some compensation  Shoulder external rotation  20 26  50/T3  Elbow flexion       Elbow extension       Wrist flexion       Wrist extension       Wrist ulnar deviation       Wrist radial deviation       Wrist pronation       Wrist supination       (Blank rows = not tested)  UPPER EXTREMITY MMT:  MMT Right 3/25 Left 3/25 Lt 4/22  Shoulder flexion 15.4 8.2 9.5  Shoulder extension     Shoulder abduction 9.9 6.8 7.3  Shoulder adduction     Shoulder internal rotation 11.3 8.8 6.4  Shoulder external rotation 11.5 8.1 7.8  Middle trapezius     Lower trapezius     Elbow flexion     Elbow extension     Wrist flexion     Wrist extension     Wrist ulnar deviation     Wrist radial deviation     Wrist pronation     Wrist supination     Grip strength (lbs)     (Blank rows = not tested)      PALPATION:  No unexpected TTP  TREATMENT DATE: Treatment                            ***: Blank lines following charge title = not provided on this treatment date.   Manual:   {[email or web address]:[id number]}  There-ex:  There-Act:  Self Care:  Nuro-Re-ed:  Gait Training:    Treatment                            6/2: Blank lines following charge title = not provided on this treatment date.   Manual:  TPDN No Hesch self correction L5 & S1- with and without PT mobs to L5 (pt rotates Lt) There-ex: LTR with deep breath Lunge and kneeling chops yellow tband Half kneel on Rt- working to reduce thoracic dextroscoliosis Plank on table oblique knee drive to hip extension There-Act:  Self Care:  Nuro-Re-ed:  Gait Training:    Treatment                            5/30: Blank lines following charge title = not provided on this treatment date.   Manual:  TPDN No IASTM bil GHJ incision Post rotation from Rt ASIS There-ex: Wide stance bridge with 10lb & abd press Supine horiz abd green tband Chest plank green tband- alt hip ext with glut set to try to lift. Iso row hold 3lb There-Act:  Self Care:  Nuro-Re-ed: Glut engagement in standing- coronal v caudal Gait Training:    PATIENT EDUCATION: Education details:  HEP, exercise form, rationale of interventions, protocol restrictions and limitations, protocol ranges, and ROM findings Person educated: Patient Education method: Explanation, Demonstration, Tactile cues, Verbal cues Education comprehension: verbalized understanding, returned demonstration, verbal cues required, tactile cues required, and needs further education  HOME EXERCISE PROGRAM: Access Code: [email or web address] URL: [email or web address]   ASSESSMENT:  CLINICAL IMPRESSION: ***  LAST APPT: Notable lack of activation in Rt obliques and hip abductors to provide upright support. In half kneel, demo thoracic dextroscoliosis, also demo poor Lt trunk rotation vs Rt. These deviations are resulting in lack of Lt scapular mobility and shoulder pain- will continue to address full biomechanical chain to meet long term goals.     OBJECTIVE IMPAIRMENTS:  decreased ROM, decreased strength, impaired UE functional use, and pain.   ACTIVITY LIMITATIONS: carrying, lifting, sleeping, bathing, toileting, dressing, self feeding, reach over head, and hygiene/grooming  PARTICIPATION LIMITATIONS: meal prep, cleaning, laundry, driving, shopping, community activity, occupation, and yard work  PERSONAL FACTORS: 1-2 comorbidities: previous replacement on the same shoulder osteoporosis; multi joint OA  are also affecting patient's functional outcome.   REHAB POTENTIAL: Good  CLINICAL DECISION MAKING: Stable/uncomplicated  EVALUATION COMPLEXITY: Low  GOALS: Goals reviewed with patient? Yes  SHORT TERM GOALS: Target date: 04/16/2023    Patient will increase passive flexion to 115 degrees  Baseline: Goal status: MET  2.  Patient will increase left shoulder  ER to 30 degrees  Baseline:  Goal status: MET  3.  Patient will use her left arm for ADL's below 90 degrees with > 2/10 pain  Baseline:  Goal status: MET   LONG TERM GOALS: Target date: POC DATE    The patient will reach overhead with left arm without pain in order to perform ADL's  Baseline:  Goal status: IN PROGRESS  2.  Patient will each behind her head without pain in order to per  from her ADL's  Baseline:  Goal status: IN PROGRESS  3.  Patient will reach behind her back to L3 without pain in order to perform ADL's  Baseline:  Goal status: IN PROGRESS L4 on 3/25  4. Able to demo overhead activity without scapular winging bilaterally  Status: New 5. Strength available for ADLs with weight/resistance such as washing dishes, vacuuming   Status: New 6. Feel prepared to lift grand children when I have them.   Status: New   PLAN: PT FREQUENCY: 2x/week  PT DURATION: 8 weeks  PLANNED INTERVENTIONS: 97110-Therapeutic exercises, 97530- Therapeutic activity, [id number]- Neuromuscular re-education, 97535- Self Care, 16109- Manual therapy, 7113- Aquatic  Therapy, 97014- Electrical stimulation (unattended), 97035- Ultrasound, Patient/Family education, Taping, Dry Needling, DME instructions, Cryotherapy, and Moist heat   PLAN FOR NEXT SESSION:  Cont per Dr. Leandro Proffer Reverse total shoulder protocol ( will be on Dave's desk )    Sharesa Kemp C. Sylvan Sookdeo PT, DPT 07/29/23 5:21 PM

## 2023-07-30 ENCOUNTER — Encounter (HOSPITAL_BASED_OUTPATIENT_CLINIC_OR_DEPARTMENT_OTHER): Payer: Self-pay | Admitting: Physical Therapy

## 2023-07-30 ENCOUNTER — Ambulatory Visit (HOSPITAL_BASED_OUTPATIENT_CLINIC_OR_DEPARTMENT_OTHER): Admitting: Physical Therapy

## 2023-07-30 DIAGNOSIS — M25512 Pain in left shoulder: Secondary | ICD-10-CM

## 2023-07-30 DIAGNOSIS — M25612 Stiffness of left shoulder, not elsewhere classified: Secondary | ICD-10-CM

## 2023-07-31 NOTE — Progress Notes (Signed)
 Neuropsychological Evaluation   Patient:  Tara [name]   DOB: Jun 28, 1960  MR Number: [id number]  Location: [facility] [place] [facility] [address] [place], [address] [place] [address] Dept: [phone]  Start: 3 PM End: 4 PM  Provider/Observer:     Marrion Sjogren PsyD  Chief Complaint:      Chief Complaint  Patient presents with   Memory Loss   Other    Attention and concentration difficulties   Pain   Sleeping Problem   04/25/2023 3 PM-4 PM: Today I provided feedback regarding the results of the recent neuropsychological evaluation.  We reviewed the diagnostic considerations and results of formal testing as well as going into depth regarding specific recommendations for the patient going forward.  I have included a copy of the formal neuropsychological report's reason for service and summary below for convenience and the patient's complete neuropsychological evaluation can be found in her EMR dated 03/27/2023.   Reason For Service:     Tara [name] is a 63 year old female referred for neuropsychological evaluation by her treating physician Ronnell Coins, DO for neuropsychological evaluation due to progressing difficulties with attention and concentration word finding difficulties and memory loss.  Patient has been having pain and other difficulties related to cervical degenerative disc disease, reports of neurocognitive deficits over the past year or so, sleep difficulties and insomnia and longstanding difficulties with mitral valve prolapse.  The patient has also dealt with chronic pain issues in her left shoulder and the patient reports that she has noted developing and slowly changing issues with memory and attention/concentration.  She reports that this has not been a particular a sudden change.  Patient notes that she first started noticing differences in cognition around the time of the  COVID pandemic.  Patient reports that she did not get COVID at that time.  The patient also has a past history of polyarthropathy and history of hypercalcemia.  Patient describes a gradual decline over the past couple years without sudden or specific epochs of significant change.   During the clinical interview today, the patient describes a steady change in neurocognitive functioning that started around 2021, with clear discussions of these issues with her physicians by 2023.  The patient reports that she has noted forgetting important events and needing to write everything down, difficulty keeping up with appointments and information, processing information slower and essentially difficulty concentrating and attending to process information.  Patient describes anhedonia and lack of interest in experiencing difficulty sitting down and completing work.  Patient does acknowledge that some of this has to do with physical pain that she notes in her leg, shoulder etc.  Patient reports that she will forget to do something and feels like she regularly zones out at meetings.  Patient describes memory difficulties for both semantic information as well as episodic memory.  Patient also notes that she is not sleeping very well.  Patient is on no specific sleep medications or narcotic pain medications.  Patient denies any history of traumatic injuries.   Along with the attention and concentration and memory difficulties as noted, the patient denies any changes in geographic orientation, denies any auditory or visual hallucinations and denies any tremors etc.  There is no family history that the patient is aware of similar to this type of pattern.  Patient notes that she has been very moody and the sudden mood changes also tend to be correlated with the  development of hot flashes.    Impression/Diagnosis:   The results of the current neuropsychological evaluation including the patient's report of cognitive decline since  around 2021 and clearly noting difficulties in the timeframe between 2021 and 2023 and describing those to her primary care physician are generally consistent between subjective reports and obtained neuropsychological test data.  While there are some mild weaknesses in memory and learning relative to estimates of premorbid functioning and mild decrease in global cognitive functioning these decreases really represent very focal and specific deficits rather than a widespread decrease in overall cognitive efficiency.  Caution should be made in over interpreting these data as almost complete failure on an isolated number of subtest are highly impacting the overall scores.  These patterns are not particularly consistent with progressive neurodegenerative types of conditions such as Alzheimer's but really represent very focal types of weaknesses.  The patient is maintaining very good information processing speed, expressive and receptive language components in general visual-spatial capacity outside of the individual component around fluid visual risk reasoning skills but maintaining good broad visual intelligence and good perceptual organizational skills.  The patient denies any geographic disorientation types of symptoms.  The patient has a long history of significant chronic pain including issues of degenerative disc disease as well as shoulder surgeries and shoulder pain.  Again, this pattern is not particularly consistent with a progressive degenerative condition and there is no family history of early onset dementia types of symptoms either.  Given the very focal nature of her weaknesses it may be warranted to have a brain MRI done just to rule out the possibility of a small focal vascular event that if present would likely be in the right parietal brain region or thalamic brain regions.  However, the patient did not describe any acute onset of symptoms and this is unlikely but would warrant review to rule out such  possibility.  While visual-spatial changes can be early symptoms the memory deficits noted would be consistent with these visual spatial and visual reasoning components rather than widespread changes in memory function.  Memory performances are still in the average range overall and good improvement under recognition and cued recall and retention of information over period of delay are all noted.  As far as treatment recommendations, sustained sleep deprivation and chronic pain could explain the mild dampening in overall cognitive functioning.  As her cognitive deficits were almost exclusively related to to very specific subtest in the testing caution should be made in over interpreting the data.  Cognitive decline noted by the patient since 2021 could be related to sustained and ongoing insomnia and chronic pain symptoms rather than indications of progressive deterioration.  Having the patient complete a brain MRI may be warranted but I will leave that decision up to her PCP.  We will also schedule the patient for follow-up neuropsychological testing in 1 year to assess for any progressive decline in objective findings.  If the patient shows or experiences no appearance of deterioration over the next year we may not need to repeat the testing at that time but scheduling it would be pragmatic at this point.  Again, the patterns are not particularly consistent with neurodegenerative condition such as Alzheimer's, Lewy body or subcortical dementia is an objective neuropsychological test data would not be consistent with significant small vessel disease/microvascular ischemic disease.  The patient has had no sudden onset of symptoms back in 2021 consistent with a strokelike event although there are very focal and consistent weaknesses  for very specific aspects of visual-spatial and visual memory components consistent with right parietal localized deficits.  I will sit down with the patient go over those results and  plan going forward.  Diagnosis:    Mild cognitive impairment with memory loss  Chronic pain syndrome  Other insomnia   _____________________ Chapman Commodore, Psy.D. Clinical Neuropsychologist

## 2023-08-06 ENCOUNTER — Encounter (HOSPITAL_BASED_OUTPATIENT_CLINIC_OR_DEPARTMENT_OTHER): Payer: Self-pay | Admitting: Physical Therapy

## 2023-08-06 ENCOUNTER — Ambulatory Visit (HOSPITAL_BASED_OUTPATIENT_CLINIC_OR_DEPARTMENT_OTHER): Payer: Self-pay | Admitting: Physical Therapy

## 2023-08-06 ENCOUNTER — Encounter (HOSPITAL_BASED_OUTPATIENT_CLINIC_OR_DEPARTMENT_OTHER): Admitting: Physical Therapy

## 2023-08-06 DIAGNOSIS — M25512 Pain in left shoulder: Secondary | ICD-10-CM

## 2023-08-06 DIAGNOSIS — M25612 Stiffness of left shoulder, not elsewhere classified: Secondary | ICD-10-CM | POA: Diagnosis not present

## 2023-08-06 NOTE — Therapy (Signed)
 OUTPATIENT PHYSICAL THERAPY UPPER EXTREMITY TREATMENT   Patient Name: Tara [name] MRN: [id number] DOB:May 16, 1960, 63 y.o., female Today's Date: 08/06/2023  END OF SESSION:  PT End of Session - 08/06/23 1453     Visit Number 30    Number of Visits 39    Date for PT Re-Evaluation 09/19/23    PT Start Time 1451    PT Stop Time 1520    PT Time Calculation (min) 29 min    Activity Tolerance Patient tolerated treatment well    Behavior During Therapy [facility] for tasks assessed/performed                       Past Medical History:  Diagnosis Date   Allergy    Anemia    Arthritis    Hyperlipidemia    Insomnia    Mitral valve prolapse    PONV (postoperative nausea and vomiting)    Past Surgical History:  Procedure Laterality Date   AUGMENTATION MAMMAPLASTY Bilateral    Dec 2019   BREAST BIOPSY Right    axillary node benign   EYE SURGERY  2010   Lasik for distance correction; use one contact in left eye for reading now   JOINT REPLACEMENT  2021; 2022; 2025   reverse shoulder on the right; anatomic replacement on the left then conversion to reverse replacement due to failed anatomic   KNEE SURGERY  1988   right   SHOULDER ARTHROSCOPY WITH LABRAL REPAIR Right 08/28/2012   Procedure: RIGHT SHOULDER ARTHROSCOPY WITH LABRAL DEBRIDEMENT, ASPIRATION AND DECOMPRESSION OF PARALABRAL CYST;  Surgeon: Glo Larch, MD;  Location: [facility] OR;  Service: Orthopedics;  Laterality: Right;   TOTAL SHOULDER ARTHROPLASTY Right 03/24/2020   Procedure: RIGHT REVERSE SHOULDER ARTHROPLASTY;  Surgeon: Ellard Gunning, MD;  Location: [facility] ORS;  Service: Orthopedics;  Laterality: Right;    TOTAL SHOULDER ARTHROPLASTY Left 02/23/2021   Procedure: TOTAL SHOULDER ARTHROPLASTY;  Surgeon: Ellard Gunning, MD;  Location: [facility];  Service: Orthopedics;  Laterality: Left;   TOTAL SHOULDER REVISION Left 02/07/2023   Procedure: Excision Left shoulder nodule; Conversion of left anatomic shoulder  arthroplasty to reverse shoulder arthroplasty;  Surgeon: Ellard Gunning, MD;  Location: [facility];  Service: Orthopedics;  Laterality: Left;   Patient Active Problem List   Diagnosis Date Noted   Onychomycosis 05/10/2023   Hypercalciuria 02/02/2023   DDD (degenerative disc disease), cervical 04/13/2021   Osteoporosis without current pathological fracture 10/28/2020   Nonallopathic lesion of sacral region 02/26/2020   Hot flashes 02/23/2019   Other insomnia 02/23/2019   Cystic acne vulgaris 02/23/2019   Bunion of right foot 02/23/2019   Varicose 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 02/23/2019   Multiple atypical nevi 02/23/2019   Mitral valve prolapse 03/03/2009    PCP: Nedra Ball [name]   REFERRING PROVIDER: Ruthy Cox Supple  REFERRING DIAG:  Diagnosis  [phone] (ICD-10-CM) - Presence of left artificial shoulder joint    THERAPY DIAG:  Stiffness of left shoulder, not elsewhere classified  Acute pain of left shoulder  Rationale for Evaluation and Treatment: Rehabilitation  ONSET DATE:  DOS: 02/07/2023  SUBJECTIVE:  SUBJECTIVE STATEMENT: The Rt SIJ and into lateral hip are just so bad. At least I can stand for a little bit before it tightens up but it is to the point where I have to get out of the car.    From Initial Evaluation: Patient had a left standard total shoulder replacement last year.  She did well for about 6 months then began having progressive pain in the shoulder.  She had to have a shoulder revision with a reverse total shoulder.  At this time her pain is well-controlled.  She is also had a right shoulder replaced.  When she had that replaced she had significant pain.  She reports this 1 is better.  She is wearing her sling outside the house and when sleeping.  She is otherwise has  been wearing her sling.  She has increased pain when she moves her arm.  She has been compliant with her HEP so far. Hand dominance: Right  PERTINENT HISTORY: Multijoint arthritis including knees and hips.  Osteoporosis, right shoulder replacement, mitral valve prolapse  PAIN:  Are you having pain? Yes: NPRS scale: 0/10  Pain location: hips to neck Pain description: sore Aggravating factors: Use of the arm Relieving factors: Not using the arm is much  PRECAUTIONS: Shoulder  RED FLAGS: None   WEIGHT BEARING RESTRICTIONS: No  FALLS:  Has patient fallen in last 6 months? No  LIVING ENVIRONMENT:   OCCUPATION: Nurse   PLOF: Independent  PATIENT GOALS:  Patient wants to be functional   OBJECTIVE:  Note: Objective measures were completed at Evaluation unless otherwise noted.   PATIENT SURVEYS :  UEFI 40/80  COGNITION: Overall cognitive status: Within functional limits for tasks assessed     SENSATION: Denies parathesias   POSTURE: Good   UPPER EXTREMITY ROM:   Passive ROM Right eval Left eval Left 1/21 L  L  Shoulder flexion  85 106 130AROM 160PROM  148AROM supine 158PROM  Shoulder extension       Shoulder abduction     112 active supine  Shoulder adduction       Shoulder internal rotation  Can sit on body comfortably   L gute  65/L iliac crest but can get to L4 with some compensation  Shoulder external rotation  20 26  50/T3  Elbow flexion       Elbow extension       Wrist flexion       Wrist extension       Wrist ulnar deviation       Wrist radial deviation       Wrist pronation       Wrist supination       (Blank rows = not tested)  UPPER EXTREMITY MMT:  MMT Right 3/25 Left 3/25 Lt 4/22  Shoulder flexion 15.4 8.2 9.5  Shoulder extension     Shoulder abduction 9.9 6.8 7.3  Shoulder adduction     Shoulder internal rotation 11.3 8.8 6.4  Shoulder external rotation 11.5 8.1 7.8  Middle trapezius     Lower trapezius     Elbow flexion      Elbow extension     Wrist flexion     Wrist extension     Wrist ulnar deviation     Wrist radial deviation     Wrist pronation     Wrist supination     Grip strength (lbs)     (Blank rows = not tested)      PALPATION:  No unexpected TTP                                                                                                                              TREATMENT DATE:  6/17: see plan  Treatment                            6/10: Blank lines following charge title = not provided on this treatment date.   Manual:  TPDN No  There-ex: Qped rt knee on 2airex- Lt hip lift/trunk rotation, cues for scapular protraction Squat to stand/reach- anchored band for eccentric return to squat- tactile cues required for midline vs tendency to shift to the left Single leg pelvis rotation- band anchored for eccentric return Shuttle 2*25 deep squat with core There-Act:  Self Care:  Nuro-Re-ed: Squat on scales Gait Training:    Treatment                            6/2: Blank lines following charge title = not provided on this treatment date.   Manual:  TPDN No Hesch self correction L5 & S1- with and without PT mobs to L5 (pt rotates Lt) There-ex: LTR with deep breath Lunge and kneeling chops yellow tband Half kneel on Rt- working to reduce thoracic dextroscoliosis Plank on table oblique knee drive to hip extension   PATIENT EDUCATION: Education details:  HEP, exercise form, rationale of interventions, protocol restrictions and limitations, protocol ranges, and ROM findings Person educated: Patient Education method: Explanation, Demonstration, Tactile cues, Verbal cues Education comprehension: verbalized understanding, returned demonstration, verbal cues required, tactile cues required, and needs further education  HOME EXERCISE PROGRAM: Access Code: [email or web address] URL: [email or web address]   ASSESSMENT:  CLINICAL IMPRESSION: Continued severe pain in Rt  SIJ and along border of sacrum continues and postural compensatory patterns are negatively affecting shoulder mechanics. Notable innominate rotation ( Rt anterior) and functional LLD addressed with post spring mob to Rt ASIS and Rt to Lt lateral pelvis glide- assigned Hesch self correction for HEP for both of these. Negative SLR and negative for distal symptoms with movement in standing. Negative grind test to Rt FA joint. All injections, DN, adjustments and manual work have only provided temporary relief. I requested that she call Dr Felipe Horton for earlier appointment to rule out other causes such as kidney stone etc. Pt continues to do her exercises, walk and work but pain is progressively worsening. May benefit from updated imaging of her right hip as Left was done 2.5 years ago.   LAST APPT: Notable lack of activation in Rt obliques and hip abductors to provide upright support. In half kneel, demo thoracic dextroscoliosis, also demo poor Lt trunk rotation vs Rt. These deviations are resulting in lack of Lt scapular mobility and shoulder pain- will continue to address full biomechanical chain to meet long term goals.  OBJECTIVE IMPAIRMENTS:  decreased ROM, decreased strength, impaired UE functional use, and pain.   ACTIVITY LIMITATIONS: carrying, lifting, sleeping, bathing, toileting, dressing, self feeding, reach over head, and hygiene/grooming  PARTICIPATION LIMITATIONS: meal prep, cleaning, laundry, driving, shopping, community activity, occupation, and yard work  PERSONAL FACTORS: 1-2 comorbidities: previous replacement on the same shoulder osteoporosis; multi joint OA  are also affecting patient's functional outcome.   REHAB POTENTIAL: Good  CLINICAL DECISION MAKING: Stable/uncomplicated  EVALUATION COMPLEXITY: Low  GOALS: Goals reviewed with patient? Yes  SHORT TERM GOALS: Target date: 04/16/2023    Patient will increase passive flexion to 115 degrees  Baseline: Goal status:  MET  2.  Patient will increase left shoulder  ER to 30 degrees  Baseline:  Goal status: MET  3.  Patient will use her left arm for ADL's below 90 degrees with > 2/10 pain  Baseline:  Goal status: MET   LONG TERM GOALS: Target date: POC DATE    The patient will reach overhead with left arm without pain in order to perform ADL's  Baseline:  Goal status: IN PROGRESS  2.  Patient will each behind her head without pain in order to per from her ADL's  Baseline:  Goal status: IN PROGRESS  3.  Patient will reach behind her back to L3 without pain in order to perform ADL's  Baseline:  Goal status: IN PROGRESS L4 on 3/25  4. Able to demo overhead activity without scapular winging bilaterally  Status: New 5. Strength available for ADLs with weight/resistance such as washing dishes, vacuuming   Status: New 6. Feel prepared to lift grand children when I have them.   Status: New   PLAN: PT FREQUENCY: 2x/week  PT DURATION: 8 weeks  PLANNED INTERVENTIONS: 97110-Therapeutic exercises, 97530- Therapeutic activity, [id number]- Neuromuscular re-education, 97535- Self Care, 40981- Manual therapy, 7113- Aquatic Therapy, [id number]- Electrical stimulation (unattended), 97035- Ultrasound, Patient/Family education, Taping, Dry Needling, DME instructions, Cryotherapy, and Moist heat   PLAN FOR NEXT SESSION:  Cont per Dr. Leandro Proffer Reverse total shoulder protocol ( will be on Dave's desk )    Carmelle Bamberg C. Kaislee Chao PT, DPT 08/06/23 5:54 PM

## 2023-08-13 ENCOUNTER — Encounter (HOSPITAL_BASED_OUTPATIENT_CLINIC_OR_DEPARTMENT_OTHER): Payer: Self-pay | Admitting: Physical Therapy

## 2023-08-13 ENCOUNTER — Ambulatory Visit (HOSPITAL_BASED_OUTPATIENT_CLINIC_OR_DEPARTMENT_OTHER): Admitting: Physical Therapy

## 2023-08-13 DIAGNOSIS — M25512 Pain in left shoulder: Secondary | ICD-10-CM | POA: Diagnosis not present

## 2023-08-13 DIAGNOSIS — M25612 Stiffness of left shoulder, not elsewhere classified: Secondary | ICD-10-CM | POA: Diagnosis not present

## 2023-08-13 NOTE — Therapy (Signed)
 OUTPATIENT PHYSICAL THERAPY UPPER EXTREMITY TREATMENT   Patient Name: Tara [name] MRN: [id number] DOB:Jul 06, 1960, 63 y.o., female Today's Date: 08/13/2023  END OF SESSION:  PT End of Session - 08/13/23 1019     Visit Number 31    Number of Visits 39    Date for PT Re-Evaluation 09/19/23    PT Start Time 1018    PT Stop Time 1100    PT Time Calculation (min) 42 min    Activity Tolerance Patient tolerated treatment well    Behavior During Therapy [facility] for tasks assessed/performed                       Past Medical History:  Diagnosis Date   Allergy    Anemia    Arthritis    Hyperlipidemia    Insomnia    Mitral valve prolapse    PONV (postoperative nausea and vomiting)    Past Surgical History:  Procedure Laterality Date   AUGMENTATION MAMMAPLASTY Bilateral    Dec 2019   BREAST BIOPSY Right    axillary node benign   EYE SURGERY  2010   Lasik for distance correction; use one contact in left eye for reading now   JOINT REPLACEMENT  2021; 2022; 2025   reverse shoulder on the right; anatomic replacement on the left then conversion to reverse replacement due to failed anatomic   KNEE SURGERY  1988   right   SHOULDER ARTHROSCOPY WITH LABRAL REPAIR Right 08/28/2012   Procedure: RIGHT SHOULDER ARTHROSCOPY WITH LABRAL DEBRIDEMENT, ASPIRATION AND DECOMPRESSION OF PARALABRAL CYST;  Surgeon: Franky CHRISTELLA Pointer, MD;  Location: [facility];  Service: Orthopedics;  Laterality: Right;   TOTAL SHOULDER ARTHROPLASTY Right 03/24/2020   Procedure: RIGHT REVERSE SHOULDER ARTHROPLASTY;  Surgeon: Pointer Franky, MD;  Location: [facility];  Service: Orthopedics;  Laterality: Right;    TOTAL SHOULDER ARTHROPLASTY Left 02/23/2021   Procedure: TOTAL SHOULDER ARTHROPLASTY;  Surgeon: Pointer Franky, MD;  Location: [facility];  Service: Orthopedics;  Laterality: Left;   TOTAL SHOULDER REVISION Left 02/07/2023   Procedure: Excision Left shoulder nodule; Conversion of left anatomic shoulder  arthroplasty to reverse shoulder arthroplasty;  Surgeon: Pointer Franky, MD;  Location: [facility];  Service: Orthopedics;  Laterality: Left;   Patient Active Problem List   Diagnosis Date Noted   Onychomycosis 05/10/2023   Hypercalciuria 02/02/2023   DDD (degenerative disc disease), cervical 04/13/2021   Osteoporosis without current pathological fracture 10/28/2020   Nonallopathic lesion of sacral region 02/26/2020   Hot flashes 02/23/2019   Other insomnia 02/23/2019   Cystic acne vulgaris 02/23/2019   Bunion of right foot 02/23/2019   Varicose 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 02/23/2019   Multiple atypical nevi 02/23/2019   Mitral valve prolapse 03/03/2009    PCP: Roxan [name]   REFERRING PROVIDER: JONETTA Franky Supple  REFERRING DIAG:  Diagnosis  [id number] (ICD-10-CM) - Presence of left artificial shoulder joint    THERAPY DIAG:  Stiffness of left shoulder, not elsewhere classified  Acute pain of left shoulder  Rationale for Evaluation and Treatment: Rehabilitation  ONSET DATE:  DOS: 02/07/2023  SUBJECTIVE:  SUBJECTIVE STATEMENT: When I sit I am about to die. Standing is even so stiff on my back.    From Initial Evaluation: Patient had a left standard total shoulder replacement last year.  She did well for about 6 months then began having progressive pain in the shoulder.  She had to have a shoulder revision with a reverse total shoulder.  At this time her pain is well-controlled.  She is also had a right shoulder replaced.  When she had that replaced she had significant pain.  She reports this 1 is better.  She is wearing her sling outside the house and when sleeping.  She is otherwise has been wearing her sling.  She has increased pain when she moves her arm.  She has been compliant with  her HEP so far. Hand dominance: Right  PERTINENT HISTORY: Multijoint arthritis including knees and hips.  Osteoporosis, right shoulder replacement, mitral valve prolapse  PAIN:  Are you having pain? Yes: NPRS scale: 0/10  Pain location: hips to neck Pain description: sore Aggravating factors: Use of the arm Relieving factors: Not using the arm is much  PRECAUTIONS: Shoulder  RED FLAGS: None   WEIGHT BEARING RESTRICTIONS: No  FALLS:  Has patient fallen in last 6 months? No  LIVING ENVIRONMENT:   OCCUPATION: Nurse   PLOF: Independent  PATIENT GOALS:  Patient wants to be functional   OBJECTIVE:  Note: Objective measures were completed at Evaluation unless otherwise noted.   PATIENT SURVEYS :  UEFI 40/80  COGNITION: Overall cognitive status: Within functional limits for tasks assessed     SENSATION: Denies parathesias   POSTURE: Good   UPPER EXTREMITY ROM:   Passive ROM Right eval Left eval Left 1/21 L  L  Shoulder flexion  85 106 130AROM 160PROM  148AROM supine 158PROM  Shoulder extension       Shoulder abduction     112 active supine  Shoulder adduction       Shoulder internal rotation  Can sit on body comfortably   L gute  65/L iliac crest but can get to L4 with some compensation  Shoulder external rotation  20 26  50/T3  Elbow flexion       Elbow extension       Wrist flexion       Wrist extension       Wrist ulnar deviation       Wrist radial deviation       Wrist pronation       Wrist supination       (Blank rows = not tested)  UPPER EXTREMITY MMT:  MMT Right 3/25 Left 3/25 Lt 4/22  Shoulder flexion 15.4 8.2 9.5  Shoulder extension     Shoulder abduction 9.9 6.8 7.3  Shoulder adduction     Shoulder internal rotation 11.3 8.8 6.4  Shoulder external rotation 11.5 8.1 7.8  Middle trapezius     Lower trapezius     Elbow flexion     Elbow extension     Wrist flexion     Wrist extension     Wrist ulnar deviation     Wrist radial  deviation     Wrist pronation     Wrist supination     Grip strength (lbs)     (Blank rows = not tested)      PALPATION:  No unexpected TTP  TREATMENT DATE:  Treatment                            6/24: Blank lines following charge title = not provided on this treatment date.   Manual:  TPDN YES Trigger Point Dry Needling  Subsequent Treatment: Instructions provided previously at initial dry needling treatment.   Patient Verbal Consent Given: Yes Education Handout Provided: Previously Provided Muscles Treated: Rt glut med Electrical Stimulation Performed: No Treatment Response/Outcome: twitch, recreated distal concordant symptoms PROM hip STM pectineus, glut med/min testing of activation with post-inf glide of hip from 45 deg passive hamstring stretch- no symptoms passively and decreased with activation +glide  There-ex:  There-Act: Car seat adjustments to improve thigh contact to seat and decrease post pelvic tilt- will see if head rest can be adjusted to reduce forward head posture.  Self Care: Symptom review & POC discussion Nuro-Re-ed:  Gait Training:    6/17: see plan  Treatment                            6/10: Blank lines following charge title = not provided on this treatment date.   Manual:  TPDN No  There-ex: Qped rt knee on 2airex- Lt hip lift/trunk rotation, cues for scapular protraction Squat to stand/reach- anchored band for eccentric return to squat- tactile cues required for midline vs tendency to shift to the left Single leg pelvis rotation- band anchored for eccentric return Shuttle 2*25 deep squat with core There-Act:  Self Care:  Nuro-Re-ed: Squat on scales Gait Training:    Treatment                            6/2: Blank lines following charge title = not provided on this treatment date.   Manual:  TPDN  No Hesch self correction L5 & S1- with and without PT mobs to L5 (pt rotates Lt) There-ex: LTR with deep breath Lunge and kneeling chops yellow tband Half kneel on Rt- working to reduce thoracic dextroscoliosis Plank on table oblique knee drive to hip extension   PATIENT EDUCATION: Education details:  HEP, exercise form, rationale of interventions, protocol restrictions and limitations, protocol ranges, and ROM findings Person educated: Patient Education method: Explanation, Demonstration, Tactile cues, Verbal cues Education comprehension: verbalized understanding, returned demonstration, verbal cues required, tactile cues required, and needs further education  HOME EXERCISE PROGRAM: Access Code: [email or web address] URL: [email or web address]   ASSESSMENT:  CLINICAL IMPRESSION: My concern is a possible tear in her Rt glut med. Seated position is most painful and creates burning from Rt SIJ region to hamstrings- denies pain past knee. Negative straight leg raise and unable to recreate pain with mobs through spine.  Concordant pain with TPDN to glut med, supine active straight leg raise and sidelying hip abduction in 30 deg of hip flexion.  Sent message to Dr Claudene- she is scheduled for 7/23 and is on [facility] for earlier appt.   Pt later sent a message stating that performing a deeper flex in her knee while seated in her chair at work seemed to reduce symptoms- will look at hamstrings closer next visit & trial round of sciatic nerve glide in AROM.     OBJECTIVE IMPAIRMENTS:  decreased ROM, decreased strength, impaired UE functional use, and pain.   ACTIVITY LIMITATIONS: carrying, lifting, sleeping, bathing, toileting, dressing, self feeding, reach over head, and  hygiene/grooming  PARTICIPATION LIMITATIONS: meal prep, cleaning, laundry, driving, shopping, community activity, occupation, and yard work  PERSONAL FACTORS: 1-2 comorbidities: previous replacement on the same shoulder  osteoporosis; multi joint OA  are also affecting patient's functional outcome.   REHAB POTENTIAL: Good  CLINICAL DECISION MAKING: Stable/uncomplicated  EVALUATION COMPLEXITY: Low  GOALS: Goals reviewed with patient? Yes  SHORT TERM GOALS: Target date: 04/16/2023    Patient will increase passive flexion to 115 degrees  Baseline: Goal status: MET  2.  Patient will increase left shoulder  ER to 30 degrees  Baseline:  Goal status: MET  3.  Patient will use her left arm for ADL's below 90 degrees with > 2/10 pain  Baseline:  Goal status: MET   LONG TERM GOALS: Target date: POC DATE    The patient will reach overhead with left arm without pain in order to perform ADL's  Baseline:  Goal status: IN PROGRESS  2.  Patient will each behind her head without pain in order to per from her ADL's  Baseline:  Goal status: IN PROGRESS  3.  Patient will reach behind her back to L3 without pain in order to perform ADL's  Baseline:  Goal status: IN PROGRESS L4 on 3/25  4. Able to demo overhead activity without scapular winging bilaterally  Status: New 5. Strength available for ADLs with weight/resistance such as washing dishes, vacuuming   Status: New 6. Feel prepared to lift grand children when I have them.   Status: New   PLAN: PT FREQUENCY: 2x/week  PT DURATION: 8 weeks  PLANNED INTERVENTIONS: 97110-Therapeutic exercises, 97530- Therapeutic activity, [id number]- Neuromuscular re-education, 97535- Self Care, [id number]- Manual therapy, 7113- Aquatic Therapy, 97014- Electrical stimulation (unattended), 97035- Ultrasound, Patient/Family education, Taping, Dry Needling, DME instructions, Cryotherapy, and Moist heat   PLAN FOR NEXT SESSION:  Cont per Dr. Rosea Reverse total shoulder protocol ( will be on Dave's desk )    Anandi Abramo C. Mialee Weyman PT, DPT 08/13/23 8:32 PM

## 2023-08-14 DIAGNOSIS — Z471 Aftercare following joint replacement surgery: Secondary | ICD-10-CM | POA: Diagnosis not present

## 2023-08-14 DIAGNOSIS — Z96612 Presence of left artificial shoulder joint: Secondary | ICD-10-CM | POA: Diagnosis not present

## 2023-08-15 ENCOUNTER — Other Ambulatory Visit: Payer: Self-pay

## 2023-08-15 ENCOUNTER — Other Ambulatory Visit (HOSPITAL_COMMUNITY): Payer: Self-pay

## 2023-08-15 DIAGNOSIS — M25552 Pain in left hip: Secondary | ICD-10-CM

## 2023-08-15 MED ORDER — AMOXICILLIN 500 MG PO CAPS
2000.0000 mg | ORAL_CAPSULE | ORAL | 0 refills | Status: DC
  Filled 2023-08-15: qty 4, 1d supply, fill #0

## 2023-08-16 ENCOUNTER — Ambulatory Visit (INDEPENDENT_AMBULATORY_CARE_PROVIDER_SITE_OTHER): Admitting: Podiatry

## 2023-08-16 DIAGNOSIS — M21612 Bunion of left foot: Secondary | ICD-10-CM | POA: Diagnosis not present

## 2023-08-16 DIAGNOSIS — L84 Corns and callosities: Secondary | ICD-10-CM | POA: Diagnosis not present

## 2023-08-16 DIAGNOSIS — B351 Tinea unguium: Secondary | ICD-10-CM | POA: Diagnosis not present

## 2023-08-16 DIAGNOSIS — M21611 Bunion of right foot: Secondary | ICD-10-CM

## 2023-08-16 DIAGNOSIS — M2041 Other hammer toe(s) (acquired), right foot: Secondary | ICD-10-CM

## 2023-08-16 DIAGNOSIS — M2042 Other hammer toe(s) (acquired), left foot: Secondary | ICD-10-CM

## 2023-08-17 NOTE — Progress Notes (Signed)
 Subjective: Chief Complaint  Patient presents with   Nail Problem    RM#13 Follow up on nail fungus seeing minor improvement.      63 year old female presents the office today with above concerns.  States that she still can use the Jublia  on the nails.  She also gets calluses that she is tries to file down on a very regular basis.  She does not recall any open lesions  or any injuries.    Objective: AAO x3, NAD DP/PT pulses palpable bilaterally, CRT less than 3 seconds Hammertoes are present as well as mild bunion.  Hyperkeratotic lesions on the medial hallux, first MTPJ bilaterally without any underlying ulceration drainage or signs of infection.  Nails are mildly hypertrophic, dystrophic with yellow-brown discoloration.   No edema, erythema or signs of infection.  No pain with calf compression, swelling, warmth, erythema  Assessment: Hyperkeratotic lesions, hammertoe/bunion deformity, onychomycosis  Plan: -All treatment options discussed with the patient including all alternatives, risks, complications.  -As a courtesy debride the calluses without any complications or bleeding.  Minimal.  Continue moisturizer, offloading.  She has a dry skin as well on the heels and we discussed a foot peel to try to help as well or exfoliating.  Continue shoes, good arch support.  Unfortunately given the bunion, foot structure this is contributing to the callus. - Continue Jublia  for nail fungus.  Tara [name] DPM

## 2023-08-19 DIAGNOSIS — D352 Benign neoplasm of pituitary gland: Secondary | ICD-10-CM | POA: Diagnosis not present

## 2023-08-22 ENCOUNTER — Ambulatory Visit (HOSPITAL_BASED_OUTPATIENT_CLINIC_OR_DEPARTMENT_OTHER): Attending: Orthopedic Surgery | Admitting: Physical Therapy

## 2023-08-22 ENCOUNTER — Encounter (HOSPITAL_BASED_OUTPATIENT_CLINIC_OR_DEPARTMENT_OTHER): Payer: Self-pay | Admitting: Physical Therapy

## 2023-08-22 DIAGNOSIS — M25512 Pain in left shoulder: Secondary | ICD-10-CM | POA: Diagnosis not present

## 2023-08-22 DIAGNOSIS — M25612 Stiffness of left shoulder, not elsewhere classified: Secondary | ICD-10-CM | POA: Diagnosis not present

## 2023-08-22 DIAGNOSIS — M25552 Pain in left hip: Secondary | ICD-10-CM | POA: Diagnosis not present

## 2023-08-22 DIAGNOSIS — M25551 Pain in right hip: Secondary | ICD-10-CM | POA: Diagnosis not present

## 2023-08-22 NOTE — Therapy (Signed)
 OUTPATIENT PHYSICAL THERAPY UPPER EXTREMITY TREATMENT   Patient Name: Tara [name] MRN: [id number] DOB:1961/02/18, 63 y.o., female Today's Date: 08/22/2023  END OF SESSION:  PT End of Session - 08/22/23 1602     Visit Number 32    Number of Visits 39    Date for PT Re-Evaluation 09/19/23    PT Start Time 1601    PT Stop Time 1644    PT Time Calculation (min) 43 min    Activity Tolerance Patient tolerated treatment well    Behavior During Therapy [facility] for tasks assessed/performed                       Past Medical History:  Diagnosis Date   Allergy    Anemia    Arthritis    Hyperlipidemia    Insomnia    Mitral valve prolapse    PONV (postoperative nausea and vomiting)    Past Surgical History:  Procedure Laterality Date   AUGMENTATION MAMMAPLASTY Bilateral    Dec 2019   BREAST BIOPSY Right    axillary node benign   EYE SURGERY  2010   Lasik for distance correction; use one contact in left eye for reading now   JOINT REPLACEMENT  2021; 2022; 2025   reverse shoulder on the right; anatomic replacement on the left then conversion to reverse replacement due to failed anatomic   KNEE SURGERY  1988   right   SHOULDER ARTHROSCOPY WITH LABRAL REPAIR Right 08/28/2012   Procedure: RIGHT SHOULDER ARTHROSCOPY WITH LABRAL DEBRIDEMENT, ASPIRATION AND DECOMPRESSION OF PARALABRAL CYST;  Surgeon: Franky CHRISTELLA Pointer, MD;  Location: [facility] OR;  Service: Orthopedics;  Laterality: Right;   TOTAL SHOULDER ARTHROPLASTY Right 03/24/2020   Procedure: RIGHT REVERSE SHOULDER ARTHROPLASTY;  Surgeon: Pointer Franky, MD;  Location: [facility];  Service: Orthopedics;  Laterality: Right;    TOTAL SHOULDER ARTHROPLASTY Left 02/23/2021   Procedure: TOTAL SHOULDER ARTHROPLASTY;  Surgeon: Pointer Franky, MD;  Location: [facility];  Service: Orthopedics;  Laterality: Left;   TOTAL SHOULDER REVISION Left 02/07/2023   Procedure: Excision Left shoulder nodule; Conversion of left anatomic shoulder  arthroplasty to reverse shoulder arthroplasty;  Surgeon: Pointer Franky, MD;  Location: [facility];  Service: Orthopedics;  Laterality: Left;   Patient Active Problem List   Diagnosis Date Noted   Onychomycosis 05/10/2023   Hypercalciuria 02/02/2023   DDD (degenerative disc disease), cervical 04/13/2021   Osteoporosis without current pathological fracture 10/28/2020   Nonallopathic lesion of sacral region 02/26/2020   Hot flashes 02/23/2019   Other insomnia 02/23/2019   Cystic acne vulgaris 02/23/2019   Bunion of right foot 02/23/2019   Varicose 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 02/23/2019   Multiple atypical nevi 02/23/2019   Mitral valve prolapse 03/03/2009    PCP: Roxan [name]   REFERRING PROVIDER: JONETTA Franky Supple (shoulder) Arthea Sharps, DO (hip)  REFERRING DIAG:  Diagnosis  [phone] (ICD-10-CM) - Presence of left artificial shoulder joint  Pain right hip  THERAPY DIAG:  Stiffness of left shoulder, not elsewhere classified - Plan: PT plan of care cert/re-cert  Acute pain of left shoulder - Plan: PT plan of care cert/re-cert  Pain in right hip - Plan: PT plan of care cert/re-cert  Rationale for Evaluation and Treatment: Rehabilitation  ONSET DATE:  DOS: 02/07/2023  SUBJECTIVE:  SUBJECTIVE STATEMENT: I was worse after DN for 2 days but it did feel  little better to do the hill for a moment. Sitting inflames it, anything with hip hinge or active SLR hurts.    From Initial Evaluation: Patient had a left standard total shoulder replacement last year.  She did well for about 6 months then began having progressive pain in the shoulder.  She had to have a shoulder revision with a reverse total shoulder.  At this time her pain is well-controlled.  She is also had a right shoulder  replaced.  When she had that replaced she had significant pain.  She reports this 1 is better.  She is wearing her sling outside the house and when sleeping.  She is otherwise has been wearing her sling.  She has increased pain when she moves her arm.  She has been compliant with her HEP so far. Hand dominance: Right  PERTINENT HISTORY: Multijoint arthritis including knees and hips.  Osteoporosis, right shoulder replacement, mitral valve prolapse  PAIN:  Are you having pain? Yes: NPRS scale: 0/10  Pain location: hips to neck Pain description: sore Aggravating factors: Use of the arm Relieving factors: Not using the arm is much  PRECAUTIONS: Shoulder  RED FLAGS: None   WEIGHT BEARING RESTRICTIONS: No  FALLS:  Has patient fallen in last 6 months? No  LIVING ENVIRONMENT:   OCCUPATION: Nurse   PLOF: Independent  PATIENT GOALS:  Patient wants to be functional   OBJECTIVE:  Note: Objective measures were completed at Evaluation unless otherwise noted.   PATIENT SURVEYS :  UEFI 40/80   UPPER EXTREMITY ROM:   Passive ROM Right eval Left eval Left 1/21 L  L  Shoulder flexion  85 106 130AROM 160PROM  148AROM supine 158PROM  Shoulder extension       Shoulder abduction     112 active supine  Shoulder adduction       Shoulder internal rotation  Can sit on body comfortably   L gute  65/L iliac crest but can get to L4 with some compensation  Shoulder external rotation  20 26  50/T3  Elbow flexion       Elbow extension       Wrist flexion       Wrist extension       Wrist ulnar deviation       Wrist radial deviation       Wrist pronation       Wrist supination       (Blank rows = not tested)  UPPER EXTREMITY MMT:  MMT Right 3/25 Left 3/25 Lt 4/22  Shoulder flexion 15.4 8.2 9.5  Shoulder extension     Shoulder abduction 9.9 6.8 7.3  Shoulder adduction     Shoulder internal rotation 11.3 8.8 6.4  Shoulder external rotation 11.5 8.1 7.8  Middle trapezius      Lower trapezius     Elbow flexion     Elbow extension     Wrist flexion     Wrist extension     Wrist ulnar deviation     Wrist radial deviation     Wrist pronation     Wrist supination     Grip strength (lbs)     (Blank rows = not tested)      PALPATION:  No unexpected TTP    LUMBAR SCREEN 7/3:  Negative change in standing extension, increased pain in flexion  Negative for change in prone on elbows static  or dynamic  Negative for change with lumbar mobilizations  Negative SLR passive, positive for concordant pain in active SLR  Tightness in hamstrings & lateral hip  Negative concordant pain in resisted hamstring MMT or hip ext MMT  Negative concordant pain in sidelying hip abd but is weak, proximal HS pain with sidelying hip abd+hip flexion  Negative cocordant pain with supine march bent knee, positive concordant pain active SLR on Rt (no change with movement of left leg)  Decreased concordant pain for a short amount of time following IASTM to hamstrings & STM to glut med/min                                                                                                                            TREATMENT DATE:  7/3 Re-eval & testing IASTM hamstrings, bilateral shoulder incisions STM Rt lateral hip  Treatment                            6/24: Blank lines following charge title = not provided on this treatment date.   Manual:  TPDN YES Trigger Point Dry Needling  Subsequent Treatment: Instructions provided previously at initial dry needling treatment.   Patient Verbal Consent Given: Yes Education Handout Provided: Previously Provided Muscles Treated: Rt glut med Electrical Stimulation Performed: No Treatment Response/Outcome: twitch, recreated distal concordant symptoms PROM hip STM pectineus, glut med/min testing of activation with post-inf glide of hip from 45 deg passive hamstring stretch- no symptoms passively and decreased with activation +glide   There-ex:  There-Act: Car seat adjustments to improve thigh contact to seat and decrease post pelvic tilt- will see if head rest can be adjusted to reduce forward head posture.  Self Care: Symptom review & POC discussion    PATIENT EDUCATION: Education details:  HEP, exercise form, rationale of interventions, protocol restrictions and limitations, protocol ranges, and ROM findings Person educated: Patient Education method: Explanation, Demonstration, Tactile cues, Verbal cues Education comprehension: verbalized understanding, returned demonstration, verbal cues required, tactile cues required, and needs further education  HOME EXERCISE PROGRAM: Access Code: [email or web address] URL: [email or web address]   ASSESSMENT:  CLINICAL IMPRESSION: Testing performed today to rule out lumbar input to pain. See objective section for testing and outcomes. Planning to follow up with Dr Claudene next week to discuss his thoughts.     OBJECTIVE IMPAIRMENTS:  decreased ROM, decreased strength, impaired UE functional use, and pain.   ACTIVITY LIMITATIONS: carrying, lifting, sleeping, bathing, toileting, dressing, self feeding, reach over head, and hygiene/grooming  PARTICIPATION LIMITATIONS: meal prep, cleaning, laundry, driving, shopping, community activity, occupation, and yard work  PERSONAL FACTORS: 1-2 comorbidities: previous replacement on the same shoulder osteoporosis; multi joint OA  are also affecting patient's functional outcome.   REHAB POTENTIAL: Good  CLINICAL DECISION MAKING: Stable/uncomplicated  EVALUATION COMPLEXITY: Low  GOALS: Goals reviewed with patient? Yes  SHORT TERM GOALS: Target date: 04/16/2023    Patient will increase passive flexion  to 115 degrees  Baseline: Goal status: MET  2.  Patient will increase left shoulder  ER to 30 degrees  Baseline:  Goal status: MET  3.  Patient will use her left arm for ADL's below 90 degrees with > 2/10 pain   Baseline:  Goal status: MET   LONG TERM GOALS: Target date: POC DATE    The patient will reach overhead with left arm without pain in order to perform ADL's  Baseline:  Goal status: IN PROGRESS  2.  Patient will each behind her head without pain in order to per from her ADL's  Baseline:  Goal status: IN PROGRESS  3.  Patient will reach behind her back to L3 without pain in order to perform ADL's  Baseline:  Goal status: IN PROGRESS L4 on 3/25  4. Able to demo overhead activity without scapular winging bilaterally  Status: New 5. Strength available for ADLs with weight/resistance such as washing dishes, vacuuming   Status: New 6. Feel prepared to lift grand children when I have them.   Status: New   PLAN: PT FREQUENCY: 2x/week  PT DURATION: 8 weeks  PLANNED INTERVENTIONS: 97110-Therapeutic exercises, 97530- Therapeutic activity, [id number]- Neuromuscular re-education, 97535- Self Care, 02859- Manual therapy, 7113- Aquatic Therapy, 97014- Electrical stimulation (unattended), 97035- Ultrasound, Patient/Family education, Taping, Dry Needling, DME instructions, Cryotherapy, and Moist heat   PLAN FOR NEXT SESSION:  Cont per Dr. Rosea Reverse total shoulder protocol ( will be on Dave's desk ), f/u with Dr Claudene next week    Harlene C. Jakirah Zaun PT, DPT 08/22/23 6:20 PM

## 2023-08-22 NOTE — Progress Notes (Signed)
 [name] [address] [place] [address] Phone: [phone] Subjective:   Tara [name], am serving as a scribe for Dr. Arthea Claudene.  I'm seeing this patient by the request  of:  [name], [name], NP  CC: Back and neck pain follow-up with  YEP:Dlagzrupcz  Tara [name] is a 63 y.o. female coming in with complaint of back and neck pain. OMT 05/10/2023. Patient states in some discomfort today. Has been doing PT every week and not doing much better.  Medications patient has been prescribed: None  Taking:         Reviewed prior external information including notes and imaging from previsou exam, outside providers and external EMR if available.   As well as notes that were available from care everywhere and other healthcare systems.  Past medical history, social, surgical and family history all reviewed in electronic medical record.  No pertanent information unless stated regarding to the chief complaint.   Past Medical History:  Diagnosis Date   Allergy    Anemia    Arthritis    Hyperlipidemia    Insomnia    Mitral valve prolapse    PONV (postoperative nausea and vomiting)     Allergies  Allergen Reactions   Codeine Nausea And Vomiting   Hydrocodone Nausea And Vomiting   Triple Antibiotic Pain Relief [Neomy-Bacit-Polymyx-Pramoxine] Other (See Comments)    Burned skin   Triple Antibiotic W/Hydrocortisone  [Bacitra-Neomycin-Polymyxin-Hc]     Burned skin    Contrast Media [Iodinated Contrast Media] Hives    After CT scan with contrast, pt had MRI without contrast. Pt informed MRI Tech that she had a rash on her legs. MRI Tech spoke with Radiologist after MRI was completed. Per Rad, since patient was here x 30 minutes after CT contrast and was doing fine, with rash fading, pt was allowed to leave facility. Alert and oriented. jmh     Review of Systems:  No headache, visual changes, nausea, vomiting, diarrhea, constipation,  dizziness, abdominal pain, skin rash, fevers, chills, night sweats, weight loss, swollen lymph nodes, body aches, joint swelling, chest pain, shortness of breath, mood changes. POSITIVE muscle aches  Objective  Blood pressure 104/66, pulse 74, height 5' 10 (1.778 m), weight 142 lb (64.4 kg), last menstrual period 08/14/2012, SpO2 97%.   General: No apparent distress alert and oriented x3 mood and affect normal, dressed appropriately.  HEENT: Pupils equal, extraocular movements intact  Respiratory: Patient's speak in full sentences and does not appear short of breath  Cardiovascular: No lower extremity edema, non tender, no erythema  MSK:  Back does have some mild loss lordosis.  Some degenerative scoliosis very minorly noted.  Antalgic gait noted.  Neck exam does have significant loss of lordosis noted.  Some tenderness to palpation in the paraspinal musculature.  Osteopathic findings  C2 flexed rotated and side bent right C6 flexed rotated and side bent left T3 extended rotated and side bent right inhaled rib T9 extended rotated and side bent left L1 flexed rotated and side bent right Sacrum right on right       Assessment and Plan:  DDD (degenerative disc disease), cervical Known arthritic changes.  Has had significant arthritic changes of multiple joints as well as the osteoporosis.  Continuing to work on finding other causes of patient's difficulties.  Continues to have the hypercalciuria and is being seen for that.  Was found to have a pituitary mass and do think we should consider  the possibility of repeating imaging in 6 to 12 months to make sure there is no enlargement.  Recheck labs for other deficiencies that could be contributing.  Follow-up again in 6 to 8 weeks otherwise.    Nonallopathic problems  Decision today to treat with OMT was based on Physical Exam  After verbal consent patient was treated with , ME, FPR techniques in cervical, rib, thoracic, lumbar, and  sacral  areas avoided HVLA  Patient tolerated the procedure well with improvement in symptoms  Patient given exercises, stretches and lifestyle modifications  See medications in patient instructions if given  Patient will follow up in 4-8 weeks     The above documentation has been reviewed and is accurate and complete Makenly Larabee M Antwuan Eckley, DO         Note: This dictation was prepared with Dragon dictation along with smaller phrase technology. Any transcriptional errors that result from this process are unintentional.

## 2023-08-28 ENCOUNTER — Ambulatory Visit: Admitting: Family Medicine

## 2023-08-29 ENCOUNTER — Encounter (HOSPITAL_BASED_OUTPATIENT_CLINIC_OR_DEPARTMENT_OTHER): Payer: Self-pay | Admitting: Physical Therapy

## 2023-08-29 ENCOUNTER — Ambulatory Visit (HOSPITAL_BASED_OUTPATIENT_CLINIC_OR_DEPARTMENT_OTHER): Admitting: Physical Therapy

## 2023-08-29 DIAGNOSIS — M25612 Stiffness of left shoulder, not elsewhere classified: Secondary | ICD-10-CM

## 2023-08-29 DIAGNOSIS — M25551 Pain in right hip: Secondary | ICD-10-CM | POA: Diagnosis not present

## 2023-08-29 DIAGNOSIS — M25512 Pain in left shoulder: Secondary | ICD-10-CM | POA: Diagnosis not present

## 2023-08-29 DIAGNOSIS — M25552 Pain in left hip: Secondary | ICD-10-CM | POA: Diagnosis not present

## 2023-08-29 NOTE — Therapy (Signed)
 OUTPATIENT PHYSICAL THERAPY UPPER EXTREMITY TREATMENT   Patient Name: Tara [name] MRN: [id number] DOB:May 29, 1960, 63 y.o., female Today's Date: 08/29/2023  END OF SESSION:  PT End of Session - 08/29/23 1604     Visit Number 33    Number of Visits 39    Date for PT Re-Evaluation 09/19/23    PT Start Time 1603    PT Stop Time 1641    PT Time Calculation (min) 38 min    Activity Tolerance Patient tolerated treatment well;Patient limited by pain    Behavior During Therapy [facility] for tasks assessed/performed                       Past Medical History:  Diagnosis Date   Allergy    Anemia    Arthritis    Hyperlipidemia    Insomnia    Mitral valve prolapse    PONV (postoperative nausea and vomiting)    Past Surgical History:  Procedure Laterality Date   AUGMENTATION MAMMAPLASTY Bilateral    Dec 2019   BREAST BIOPSY Right    axillary node benign   EYE SURGERY  2010   Lasik for distance correction; use one contact in left eye for reading now   JOINT REPLACEMENT  2021; 2022; 2025   reverse shoulder on the right; anatomic replacement on the left then conversion to reverse replacement due to failed anatomic   KNEE SURGERY  1988   right   SHOULDER ARTHROSCOPY WITH LABRAL REPAIR Right 08/28/2012   Procedure: RIGHT SHOULDER ARTHROSCOPY WITH LABRAL DEBRIDEMENT, ASPIRATION AND DECOMPRESSION OF PARALABRAL CYST;  Surgeon: Franky CHRISTELLA Pointer, MD;  Location: [facility] OR;  Service: Orthopedics;  Laterality: Right;   TOTAL SHOULDER ARTHROPLASTY Right 03/24/2020   Procedure: RIGHT REVERSE SHOULDER ARTHROPLASTY;  Surgeon: Pointer Franky, MD;  Location: [facility];  Service: Orthopedics;  Laterality: Right;    TOTAL SHOULDER ARTHROPLASTY Left 02/23/2021   Procedure: TOTAL SHOULDER ARTHROPLASTY;  Surgeon: Pointer Franky, MD;  Location: [facility];  Service: Orthopedics;  Laterality: Left;   TOTAL SHOULDER REVISION Left 02/07/2023   Procedure: Excision Left shoulder nodule; Conversion of  left anatomic shoulder arthroplasty to reverse shoulder arthroplasty;  Surgeon: Pointer Franky, MD;  Location: [facility];  Service: Orthopedics;  Laterality: Left;   Patient Active Problem List   Diagnosis Date Noted   Onychomycosis 05/10/2023   Hypercalciuria 02/02/2023   DDD (degenerative disc disease), cervical 04/13/2021   Osteoporosis without current pathological fracture 10/28/2020   Nonallopathic lesion of sacral region 02/26/2020   Hot flashes 02/23/2019   Other insomnia 02/23/2019   Cystic acne vulgaris 02/23/2019   Bunion of right foot 02/23/2019   Varicose 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 02/23/2019   Multiple atypical nevi 02/23/2019   Mitral valve prolapse 03/03/2009    PCP: Roxan [name]   REFERRING PROVIDER: JONETTA Franky Supple (shoulder) Arthea Sharps, DO (hip)  REFERRING DIAG:  Diagnosis  [phone] (ICD-10-CM) - Presence of left artificial shoulder joint  Pain right hip  THERAPY DIAG:  Stiffness of left shoulder, not elsewhere classified  Acute pain of left shoulder  Pain in right hip  Rationale for Evaluation and Treatment: Rehabilitation  ONSET DATE:  DOS: 02/07/2023  SUBJECTIVE:  SUBJECTIVE STATEMENT: Everything is still the same, not getting worse but not getting better. Immediate pain with sitting around whole hip. No connection running past knee.    From Initial Evaluation: Patient had a left standard total shoulder replacement last year.  She did well for about 6 months then began having progressive pain in the shoulder.  She had to have a shoulder revision with a reverse total shoulder.  At this time her pain is well-controlled.  She is also had a right shoulder replaced.  When she had that replaced she had significant pain.  She reports this 1 is better.  She is  wearing her sling outside the house and when sleeping.  She is otherwise has been wearing her sling.  She has increased pain when she moves her arm.  She has been compliant with her HEP so far. Hand dominance: Right  PERTINENT HISTORY: Multijoint arthritis including knees and hips.  Osteoporosis, right shoulder replacement, mitral valve prolapse  PAIN:  Are you having pain? Yes: NPRS scale: 0/10  Pain location: hips to neck Pain description: sore Aggravating factors: Use of the arm Relieving factors: Not using the arm is much  PRECAUTIONS: Shoulder  RED FLAGS: None   WEIGHT BEARING RESTRICTIONS: No  FALLS:  Has patient fallen in last 6 months? No  LIVING ENVIRONMENT:   OCCUPATION: Nurse   PLOF: Independent  PATIENT GOALS:  Patient wants to be functional   OBJECTIVE:  Note: Objective measures were completed at Evaluation unless otherwise noted.   PATIENT SURVEYS :  UEFI 40/80   UPPER EXTREMITY ROM:   Passive ROM Right eval Left eval Left 1/21 L  L  Shoulder flexion  85 106 130AROM 160PROM  148AROM supine 158PROM  Shoulder extension       Shoulder abduction     112 active supine  Shoulder adduction       Shoulder internal rotation  Can sit on body comfortably   L gute  65/L iliac crest but can get to L4 with some compensation  Shoulder external rotation  20 26  50/T3  Elbow flexion       Elbow extension       Wrist flexion       Wrist extension       Wrist ulnar deviation       Wrist radial deviation       Wrist pronation       Wrist supination       (Blank rows = not tested)  UPPER EXTREMITY MMT:  MMT Right 3/25 Left 3/25 Lt 4/22  Shoulder flexion 15.4 8.2 9.5  Shoulder extension     Shoulder abduction 9.9 6.8 7.3  Shoulder adduction     Shoulder internal rotation 11.3 8.8 6.4  Shoulder external rotation 11.5 8.1 7.8  Middle trapezius     Lower trapezius     Elbow flexion     Elbow extension     Wrist flexion     Wrist extension      Wrist ulnar deviation     Wrist radial deviation     Wrist pronation     Wrist supination     Grip strength (lbs)     (Blank rows = not tested)      PALPATION:  No unexpected TTP    LUMBAR SCREEN 7/3:  Negative change in standing extension, increased pain in flexion  Negative for change in prone on elbows static or dynamic  Negative for change with lumbar mobilizations  Negative SLR passive, positive for concordant pain in active SLR  Tightness in hamstrings & lateral hip  Negative concordant pain in resisted hamstring MMT or hip ext MMT  Negative concordant pain in sidelying hip abd (but is weak), proximal HS pain with sidelying hip abd+hip flexion  Negative cocordant pain with supine march bent knee, positive concordant pain active SLR on Rt (no change with movement of left leg)  Decreased concordant pain for a short amount of time following IASTM to hamstrings & STM to glut med/min                                                                                                                            TREATMENT DATE:  7/10 Supine iso hamstring curl, iso add with relaxed glutes & core- also alternating with breathing  STM/roller to Rt lateral hip and hamstrings  7/3 Re-eval & testing IASTM hamstrings, bilateral shoulder incisions STM Rt lateral hip  Treatment                            6/24: Blank lines following charge title = not provided on this treatment date.   Manual:  TPDN YES Trigger Point Dry Needling  Subsequent Treatment: Instructions provided previously at initial dry needling treatment.   Patient Verbal Consent Given: Yes Education Handout Provided: Previously Provided Muscles Treated: Rt glut med Electrical Stimulation Performed: No Treatment Response/Outcome: twitch, recreated distal concordant symptoms PROM hip STM pectineus, glut med/min testing of activation with post-inf glide of hip from 45 deg passive hamstring stretch- no symptoms passively  and decreased with activation +glide  There-ex:  There-Act: Car seat adjustments to improve thigh contact to seat and decrease post pelvic tilt- will see if head rest can be adjusted to reduce forward head posture.  Self Care: Symptom review & POC discussion    PATIENT EDUCATION: Education details:  HEP, exercise form, rationale of interventions, protocol restrictions and limitations, protocol ranges, and ROM findings Person educated: Patient Education method: Explanation, Demonstration, Tactile cues, Verbal cues Education comprehension: verbalized understanding, returned demonstration, verbal cues required, tactile cues required, and needs further education  HOME EXERCISE PROGRAM: Access Code: [email or web address] URL: [email or web address]   ASSESSMENT:  CLINICAL IMPRESSION: Seated posterior rotation of Rt innominate- evened out with pressing Rt knee forward but increased concordant pain. Standing posture in posterior pelvic tilt, decreased lumbar lordosis and distal femur IR. Tactile cues and assistance required to correct which took some strain off of lumbar spine but pt was unable to hold to determine full effect.     OBJECTIVE IMPAIRMENTS:  decreased ROM, decreased strength, impaired UE functional use, and pain.   ACTIVITY LIMITATIONS: carrying, lifting, sleeping, bathing, toileting, dressing, self feeding, reach over head, and hygiene/grooming  PARTICIPATION LIMITATIONS: meal prep, cleaning, laundry, driving, shopping, community activity, occupation, and yard work  PERSONAL FACTORS: 1-2 comorbidities: previous replacement on the same shoulder osteoporosis; multi joint OA  are also  affecting patient's functional outcome.   REHAB POTENTIAL: Good  CLINICAL DECISION MAKING: Stable/uncomplicated  EVALUATION COMPLEXITY: Low  GOALS: Goals reviewed with patient? Yes  SHORT TERM GOALS: Target date: 04/16/2023    Patient will increase passive flexion to 115 degrees   Baseline: Goal status: MET  2.  Patient will increase left shoulder  ER to 30 degrees  Baseline:  Goal status: MET  3.  Patient will use her left arm for ADL's below 90 degrees with > 2/10 pain  Baseline:  Goal status: MET   LONG TERM GOALS: Target date: POC DATE    The patient will reach overhead with left arm without pain in order to perform ADL's  Baseline:  Goal status: IN PROGRESS  2.  Patient will each behind her head without pain in order to per from her ADL's  Baseline:  Goal status: IN PROGRESS  3.  Patient will reach behind her back to L3 without pain in order to perform ADL's  Baseline:  Goal status: IN PROGRESS L4 on 3/25  4. Able to demo overhead activity without scapular winging bilaterally  Status: New 5. Strength available for ADLs with weight/resistance such as washing dishes, vacuuming   Status: New 6. Feel prepared to lift grand children when I have them.   Status: New   PLAN: PT FREQUENCY: 2x/week  PT DURATION: 8 weeks  PLANNED INTERVENTIONS: 97110-Therapeutic exercises, 97530- Therapeutic activity, [id number]- Neuromuscular re-education, 97535- Self Care, [id number]- Manual therapy, 7113- Aquatic Therapy, [id number]- Electrical stimulation (unattended), 97035- Ultrasound, Patient/Family education, Taping, Dry Needling, DME instructions, Cryotherapy, and Moist heat   PLAN FOR NEXT SESSION:  Cont per Dr. Rosea Reverse total shoulder protocol ( will be on Dave's desk ), f/u with Dr Claudene next week    Harlene C. Wyley Hack PT, DPT 08/29/23 4:45 PM

## 2023-08-30 ENCOUNTER — Other Ambulatory Visit: Payer: Self-pay | Admitting: Family

## 2023-08-30 DIAGNOSIS — Z1231 Encounter for screening mammogram for malignant neoplasm of breast: Secondary | ICD-10-CM

## 2023-09-01 ENCOUNTER — Encounter: Payer: Self-pay | Admitting: Podiatry

## 2023-09-02 ENCOUNTER — Ambulatory Visit: Admitting: Cardiovascular Disease

## 2023-09-03 ENCOUNTER — Encounter: Payer: Self-pay | Admitting: Family Medicine

## 2023-09-03 ENCOUNTER — Ambulatory Visit: Admitting: Family Medicine

## 2023-09-03 ENCOUNTER — Ambulatory Visit (INDEPENDENT_AMBULATORY_CARE_PROVIDER_SITE_OTHER)

## 2023-09-03 ENCOUNTER — Encounter (HOSPITAL_BASED_OUTPATIENT_CLINIC_OR_DEPARTMENT_OTHER): Payer: Self-pay | Admitting: Physical Therapy

## 2023-09-03 VITALS — BP 104/66 | HR 74 | Ht 70.0 in | Wt 142.0 lb

## 2023-09-03 DIAGNOSIS — M9908 Segmental and somatic dysfunction of rib cage: Secondary | ICD-10-CM

## 2023-09-03 DIAGNOSIS — E538 Deficiency of other specified B group vitamins: Secondary | ICD-10-CM | POA: Diagnosis not present

## 2023-09-03 DIAGNOSIS — M9903 Segmental and somatic dysfunction of lumbar region: Secondary | ICD-10-CM | POA: Diagnosis not present

## 2023-09-03 DIAGNOSIS — M9901 Segmental and somatic dysfunction of cervical region: Secondary | ICD-10-CM | POA: Diagnosis not present

## 2023-09-03 DIAGNOSIS — M810 Age-related osteoporosis without current pathological fracture: Secondary | ICD-10-CM | POA: Diagnosis not present

## 2023-09-03 DIAGNOSIS — M9904 Segmental and somatic dysfunction of sacral region: Secondary | ICD-10-CM

## 2023-09-03 DIAGNOSIS — M2550 Pain in unspecified joint: Secondary | ICD-10-CM | POA: Diagnosis not present

## 2023-09-03 DIAGNOSIS — M25552 Pain in left hip: Secondary | ICD-10-CM | POA: Diagnosis not present

## 2023-09-03 DIAGNOSIS — M545 Low back pain, unspecified: Secondary | ICD-10-CM | POA: Diagnosis not present

## 2023-09-03 DIAGNOSIS — M1611 Unilateral primary osteoarthritis, right hip: Secondary | ICD-10-CM | POA: Diagnosis not present

## 2023-09-03 DIAGNOSIS — M47816 Spondylosis without myelopathy or radiculopathy, lumbar region: Secondary | ICD-10-CM | POA: Diagnosis not present

## 2023-09-03 DIAGNOSIS — M9902 Segmental and somatic dysfunction of thoracic region: Secondary | ICD-10-CM

## 2023-09-03 DIAGNOSIS — M5030 Other cervical disc degeneration, unspecified cervical region: Secondary | ICD-10-CM

## 2023-09-03 DIAGNOSIS — M25551 Pain in right hip: Secondary | ICD-10-CM

## 2023-09-03 LAB — URIC ACID: Uric Acid, Serum: 2.8 mg/dL (ref 2.4–7.0)

## 2023-09-03 LAB — IBC PANEL
Iron: 84 ug/dL (ref 42–145)
Saturation Ratios: 26.1 % (ref 20.0–50.0)
TIBC: 322 ug/dL (ref 250.0–450.0)
Transferrin: 230 mg/dL (ref 212.0–360.0)

## 2023-09-03 LAB — COMPREHENSIVE METABOLIC PANEL WITH GFR
ALT: 19 U/L (ref 0–35)
AST: 31 U/L (ref 0–37)
Albumin: 4.3 g/dL (ref 3.5–5.2)
Alkaline Phosphatase: 37 U/L — ABNORMAL LOW (ref 39–117)
BUN: 19 mg/dL (ref 6–23)
CO2: 30 meq/L (ref 19–32)
Calcium: 9.1 mg/dL (ref 8.4–10.5)
Chloride: 97 meq/L (ref 96–112)
Creatinine, Ser: 0.62 mg/dL (ref 0.40–1.20)
GFR: 95.7 mL/min (ref >60.00)
Glucose, Bld: 92 mg/dL (ref 70–99)
Potassium: 4.2 meq/L (ref 3.5–5.1)
Sodium: 131 meq/L — ABNORMAL LOW (ref 135–145)
Total Bilirubin: 0.5 mg/dL (ref 0.2–1.2)
Total Protein: 6.3 g/dL (ref 6.0–8.3)

## 2023-09-03 LAB — CBC WITH DIFFERENTIAL/PLATELET
Basophils Absolute: 0 K/uL (ref 0.0–0.1)
Basophils Relative: 0.7 % (ref 0.0–3.0)
Eosinophils Absolute: 0 K/uL (ref 0.0–0.7)
Eosinophils Relative: 0.7 % (ref 0.0–5.0)
HCT: 41.3 % (ref 36.0–46.0)
Hemoglobin: 14 g/dL (ref 12.0–15.0)
Lymphocytes Relative: 20.3 % (ref 12.0–46.0)
Lymphs Abs: 1.1 K/uL (ref 0.7–4.0)
MCHC: 34 g/dL (ref 30.0–36.0)
MCV: 97.9 fl (ref 78.0–100.0)
Monocytes Absolute: 0.6 K/uL (ref 0.1–1.0)
Monocytes Relative: 10.3 % (ref 3.0–12.0)
Neutro Abs: 3.7 K/uL (ref 1.4–7.7)
Neutrophils Relative %: 68 % (ref 43.0–77.0)
Platelets: 178 K/uL (ref 150.0–400.0)
RBC: 4.21 Mil/uL (ref 3.87–5.11)
RDW: 13.1 % (ref 11.5–15.5)
WBC: 5.4 K/uL (ref 4.0–10.5)

## 2023-09-03 LAB — VITAMIN D 25 HYDROXY (VIT D DEFICIENCY, FRACTURES): VITD: 47.04 ng/mL (ref 30.00–100.00)

## 2023-09-03 LAB — SEDIMENTATION RATE: Sed Rate: 5 mm/h (ref 0–30)

## 2023-09-03 LAB — VITAMIN B12: Vitamin B-12: 469 pg/mL (ref 211–911)

## 2023-09-03 LAB — FOLATE: Folate: >23.4 ng/mL (ref >5.9)

## 2023-09-03 LAB — FERRITIN: Ferritin: 30.9 ng/mL (ref 10.0–291.0)

## 2023-09-03 MED ORDER — CYANOCOBALAMIN 1000 MCG/ML IJ SOLN
1000.0000 ug | Freq: Once | INTRAMUSCULAR | Status: AC
  Administered 2023-09-03: 1000 ug via INTRAMUSCULAR

## 2023-09-03 NOTE — Assessment & Plan Note (Signed)
 B12 injection given

## 2023-09-03 NOTE — Assessment & Plan Note (Signed)
 Known arthritic changes.  Has had significant arthritic changes of multiple joints as well as the osteoporosis.  Continuing to work on finding other causes of patient's difficulties.  Continues to have the hypercalciuria and is being seen for that.  Was found to have a pituitary mass and do think we should consider the possibility of repeating imaging in 6 to 12 months to make sure there is no enlargement.  Recheck labs for other deficiencies that could be contributing.  Follow-up again in 6 to 8 weeks otherwise.

## 2023-09-03 NOTE — Patient Instructions (Addendum)
 Labs today B12 injection after labs Xray today  [facility] [phone] Call Today  When we receive your results we will contact you.

## 2023-09-04 ENCOUNTER — Encounter: Payer: Self-pay | Admitting: Family Medicine

## 2023-09-05 ENCOUNTER — Ambulatory Visit: Payer: Self-pay | Admitting: Family Medicine

## 2023-09-06 ENCOUNTER — Other Ambulatory Visit: Payer: Self-pay | Admitting: Student

## 2023-09-06 ENCOUNTER — Ambulatory Visit: Payer: 59 | Admitting: Podiatry

## 2023-09-11 ENCOUNTER — Ambulatory Visit: Admitting: Family Medicine

## 2023-09-11 LAB — PTH, INTACT AND CALCIUM

## 2023-09-11 LAB — PTH-RELATED PEPTIDE: PTH-Related Protein (PTH-RP): 10 pg/mL — ABNORMAL LOW (ref 11–20)

## 2023-09-12 ENCOUNTER — Ambulatory Visit (HOSPITAL_BASED_OUTPATIENT_CLINIC_OR_DEPARTMENT_OTHER): Admitting: Physical Therapy

## 2023-09-12 ENCOUNTER — Encounter (HOSPITAL_BASED_OUTPATIENT_CLINIC_OR_DEPARTMENT_OTHER): Payer: Self-pay | Admitting: Physical Therapy

## 2023-09-12 DIAGNOSIS — M25551 Pain in right hip: Secondary | ICD-10-CM | POA: Diagnosis not present

## 2023-09-12 DIAGNOSIS — M25612 Stiffness of left shoulder, not elsewhere classified: Secondary | ICD-10-CM

## 2023-09-12 DIAGNOSIS — M25552 Pain in left hip: Secondary | ICD-10-CM | POA: Diagnosis not present

## 2023-09-12 DIAGNOSIS — M25512 Pain in left shoulder: Secondary | ICD-10-CM | POA: Diagnosis not present

## 2023-09-12 NOTE — Therapy (Signed)
 OUTPATIENT PHYSICAL THERAPY UPPER EXTREMITY TREATMENT   Patient Name: Tara [name] MRN: [id number] DOB:1960/06/24, 63 y.o., female Today's Date: 09/12/2023  END OF SESSION:  PT End of Session - 09/12/23 1614     Visit Number 34    Number of Visits 39    Date for PT Re-Evaluation 09/19/23    PT Start Time 1600    PT Stop Time 1642    PT Time Calculation (min) 42 min    Activity Tolerance Patient tolerated treatment well;Patient limited by pain    Behavior During Therapy [facility] for tasks assessed/performed                       Past Medical History:  Diagnosis Date   Allergy    Anemia    Arthritis    Hyperlipidemia    Insomnia    Mitral valve prolapse    PONV (postoperative nausea and vomiting)    Past Surgical History:  Procedure Laterality Date   AUGMENTATION MAMMAPLASTY Bilateral    Dec 2019   BREAST BIOPSY Right    axillary node benign   EYE SURGERY  2010   Lasik for distance correction; use one contact in left eye for reading now   JOINT REPLACEMENT  2021; 2022; 2025   reverse shoulder on the right; anatomic replacement on the left then conversion to reverse replacement due to failed anatomic   KNEE SURGERY  1988   right   SHOULDER ARTHROSCOPY WITH LABRAL REPAIR Right 08/28/2012   Procedure: RIGHT SHOULDER ARTHROSCOPY WITH LABRAL DEBRIDEMENT, ASPIRATION AND DECOMPRESSION OF PARALABRAL CYST;  Surgeon: Franky CHRISTELLA Pointer, MD;  Location: [facility] OR;  Service: Orthopedics;  Laterality: Right;   TOTAL SHOULDER ARTHROPLASTY Right 03/24/2020   Procedure: RIGHT REVERSE SHOULDER ARTHROPLASTY;  Surgeon: Pointer Franky, MD;  Location: [facility];  Service: Orthopedics;  Laterality: Right;    TOTAL SHOULDER ARTHROPLASTY Left 02/23/2021   Procedure: TOTAL SHOULDER ARTHROPLASTY;  Surgeon: Pointer Franky, MD;  Location: [facility];  Service: Orthopedics;  Laterality: Left;   TOTAL SHOULDER REVISION Left 02/07/2023   Procedure: Excision Left shoulder nodule; Conversion of  left anatomic shoulder arthroplasty to reverse shoulder arthroplasty;  Surgeon: Pointer Franky, MD;  Location: [facility];  Service: Orthopedics;  Laterality: Left;   Patient Active Problem List   Diagnosis Date Noted   B12 deficiency 09/03/2023   Onychomycosis 05/10/2023   Hypercalciuria 02/02/2023   DDD (degenerative disc disease), cervical 04/13/2021   Osteoporosis without current pathological fracture 10/28/2020   Nonallopathic lesion of sacral region 02/26/2020   Hot flashes 02/23/2019   Other insomnia 02/23/2019   Cystic acne vulgaris 02/23/2019   Bunion of right foot 02/23/2019   Varicose 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 02/23/2019   Multiple atypical nevi 02/23/2019   Mitral valve prolapse 03/03/2009    PCP: Roxan [name]   REFERRING PROVIDER: JONETTA Franky Supple (shoulder) Arthea Sharps, DO (hip)  REFERRING DIAG:  Diagnosis  [phone] (ICD-10-CM) - Presence of left artificial shoulder joint  Pain right hip  THERAPY DIAG:  Stiffness of left shoulder, not elsewhere classified  Acute pain of left shoulder  Rationale for Evaluation and Treatment: Rehabilitation  ONSET DATE:  DOS: 02/07/2023  SUBJECTIVE:  SUBJECTIVE STATEMENT: Everything is still the same, not getting worse but not getting better. Immediate pain with sitting around whole hip. No connection running past knee.    From Initial Evaluation: Patient had a left standard total shoulder replacement last year.  She did well for about 6 months then began having progressive pain in the shoulder.  She had to have a shoulder revision with a reverse total shoulder.  At this time her pain is well-controlled.  She is also had a right shoulder replaced.  When she had that replaced she had significant pain.  She reports this 1 is better.   She is wearing her sling outside the house and when sleeping.  She is otherwise has been wearing her sling.  She has increased pain when she moves her arm.  She has been compliant with her HEP so far. Hand dominance: Right  PERTINENT HISTORY: Multijoint arthritis including knees and hips.  Osteoporosis, right shoulder replacement, mitral valve prolapse  PAIN:  Are you having pain? Yes: NPRS scale: 0/10  Pain location: hips to neck Pain description: sore Aggravating factors: Use of the arm Relieving factors: Not using the arm is much  PRECAUTIONS: Shoulder  RED FLAGS: None   WEIGHT BEARING RESTRICTIONS: No  FALLS:  Has patient fallen in last 6 months? No  LIVING ENVIRONMENT:   OCCUPATION: Nurse   PLOF: Independent  PATIENT GOALS:  Patient wants to be functional   OBJECTIVE:  Note: Objective measures were completed at Evaluation unless otherwise noted.   PATIENT SURVEYS :  UEFI 40/80   UPPER EXTREMITY ROM:   Passive ROM Right eval Left eval Left 1/21 L  L  Shoulder flexion  85 106 130AROM 160PROM  148AROM supine 158PROM  Shoulder extension       Shoulder abduction     112 active supine  Shoulder adduction       Shoulder internal rotation  Can sit on body comfortably   L gute  65/L iliac crest but can get to L4 with some compensation  Shoulder external rotation  20 26  50/T3  Elbow flexion       Elbow extension       Wrist flexion       Wrist extension       Wrist ulnar deviation       Wrist radial deviation       Wrist pronation       Wrist supination       (Blank rows = not tested)  UPPER EXTREMITY MMT:  MMT Right 3/25 Left 3/25 Lt 4/22  Shoulder flexion 15.4 8.2 9.5  Shoulder extension     Shoulder abduction 9.9 6.8 7.3  Shoulder adduction     Shoulder internal rotation 11.3 8.8 6.4  Shoulder external rotation 11.5 8.1 7.8  Middle trapezius     Lower trapezius     Elbow flexion     Elbow extension     Wrist flexion     Wrist extension      Wrist ulnar deviation     Wrist radial deviation     Wrist pronation     Wrist supination     Grip strength (lbs)     (Blank rows = not tested)      PALPATION:  No unexpected TTP    LUMBAR SCREEN 7/3:  Negative change in standing extension, increased pain in flexion  Negative for change in prone on elbows static or dynamic  Negative for change with lumbar mobilizations  Negative SLR passive, positive for concordant pain in active SLR  Tightness in hamstrings & lateral hip  Negative concordant pain in resisted hamstring MMT or hip ext MMT  Negative concordant pain in sidelying hip abd (but is weak), proximal HS pain with sidelying hip abd+hip flexion  Negative cocordant pain with supine march bent knee, positive concordant pain active SLR on Rt (no change with movement of left leg)  Decreased concordant pain for a short amount of time following IASTM to hamstrings & STM to glut med/min                                                                                                                            TREATMENT DATE:  7/24 IASTM bilateral incision site anterior shoulder Supine ecc horiz abd 2lb Supine UE scissors 2lb Sidelying row- anterior and table anchor, red tband Sidelying ER anterior anchor, red tband Qped row + triceps kick 2lb Reviewed plank and oblique V-ups form  7/10 Supine iso hamstring curl, iso add with relaxed glutes & core- also alternating with breathing  STM/roller to Rt lateral hip and hamstrings  7/3 Re-eval & testing IASTM hamstrings, bilateral shoulder incisions STM Rt lateral hip  Treatment                            6/24: Blank lines following charge title = not provided on this treatment date.   Manual:  TPDN YES Trigger Point Dry Needling  Subsequent Treatment: Instructions provided previously at initial dry needling treatment.   Patient Verbal Consent Given: Yes Education Handout Provided: Previously Provided Muscles Treated: Rt  glut med Electrical Stimulation Performed: No Treatment Response/Outcome: twitch, recreated distal concordant symptoms PROM hip STM pectineus, glut med/min testing of activation with post-inf glide of hip from 45 deg passive hamstring stretch- no symptoms passively and decreased with activation +glide  There-ex:  There-Act: Car seat adjustments to improve thigh contact to seat and decrease post pelvic tilt- will see if head rest can be adjusted to reduce forward head posture.  Self Care: Symptom review & POC discussion    PATIENT EDUCATION: Education details:  HEP, exercise form, rationale of interventions, protocol restrictions and limitations, protocol ranges, and ROM findings Person educated: Patient Education method: Explanation, Demonstration, Tactile cues, Verbal cues Education comprehension: verbalized understanding, returned demonstration, verbal cues required, tactile cues required, and needs further education  HOME EXERCISE PROGRAM: Access Code: [email or web address] URL: [email or web address]   ASSESSMENT:  CLINICAL IMPRESSION: Able to demo good form with scapular and shoulder movement but lacks endurance for daily posture. Will continue to work on scapulothoracic and shoulder strength/stability/endurance while waiting for dx from hip MRI.     OBJECTIVE IMPAIRMENTS:  decreased ROM, decreased strength, impaired UE functional use, and pain.   ACTIVITY LIMITATIONS: carrying, lifting, sleeping, bathing, toileting, dressing, self feeding, reach over head, and hygiene/grooming  PARTICIPATION LIMITATIONS: meal prep, cleaning, laundry, driving, shopping, community activity, occupation, and  yard work  PERSONAL FACTORS: 1-2 comorbidities: previous replacement on the same shoulder osteoporosis; multi joint OA  are also affecting patient's functional outcome.   REHAB POTENTIAL: Good  CLINICAL DECISION MAKING: Stable/uncomplicated  EVALUATION COMPLEXITY:  Low  GOALS: Goals reviewed with patient? Yes  SHORT TERM GOALS: Target date: 04/16/2023    Patient will increase passive flexion to 115 degrees  Baseline: Goal status: MET  2.  Patient will increase left shoulder  ER to 30 degrees  Baseline:  Goal status: MET  3.  Patient will use her left arm for ADL's below 90 degrees with > 2/10 pain  Baseline:  Goal status: MET   LONG TERM GOALS: Target date: POC DATE    The patient will reach overhead with left arm without pain in order to perform ADL's  Baseline:  Goal status: IN PROGRESS  2.  Patient will each behind her head without pain in order to per from her ADL's  Baseline:  Goal status: IN PROGRESS  3.  Patient will reach behind her back to L3 without pain in order to perform ADL's  Baseline:  Goal status: IN PROGRESS L4 on 3/25  4. Able to demo overhead activity without scapular winging bilaterally  Status: New 5. Strength available for ADLs with weight/resistance such as washing dishes, vacuuming   Status: New 6. Feel prepared to lift grand children when I have them.   Status: New   PLAN: PT FREQUENCY: 2x/week  PT DURATION: 8 weeks  PLANNED INTERVENTIONS: 97110-Therapeutic exercises, 97530- Therapeutic activity, [id number]- Neuromuscular re-education, 97535- Self Care, [id number]- Manual therapy, 7113- Aquatic Therapy, 97014- Electrical stimulation (unattended), 97035- Ultrasound, Patient/Family education, Taping, Dry Needling, DME instructions, Cryotherapy, and Moist heat   PLAN FOR NEXT SESSION:  Cont per Dr. Rosea Reverse total shoulder protocol ( will be on Dave's desk ), f/u with Dr Claudene next week    Harlene C. Kadon Andrus PT, DPT 09/12/23 4:51 PM

## 2023-09-17 ENCOUNTER — Encounter (HOSPITAL_BASED_OUTPATIENT_CLINIC_OR_DEPARTMENT_OTHER): Admitting: Physical Therapy

## 2023-09-18 ENCOUNTER — Telehealth: Payer: Self-pay

## 2023-09-18 NOTE — Telephone Encounter (Signed)
 Copied from CRM [id number]. Topic: General - Other >> Sep 18, 2023  1:30 PM Zebedee SAUNDERS wrote: Reason for CRM: Received call from [facility] per [place] ph: [phone] regarding removing son's Janisa Labus. 11/18/1995 bill from pt's account. Please contact Elenor to confirm removal of son's bill from pt.

## 2023-09-19 ENCOUNTER — Other Ambulatory Visit (HOSPITAL_COMMUNITY): Payer: Self-pay

## 2023-09-19 ENCOUNTER — Other Ambulatory Visit: Payer: Self-pay

## 2023-09-19 ENCOUNTER — Telehealth: Payer: Self-pay

## 2023-09-19 MED ORDER — PREDNISONE 50 MG PO TABS
ORAL_TABLET | ORAL | 0 refills | Status: DC
  Filled 2023-09-19 (×2): qty 3, 1d supply, fill #0

## 2023-09-19 NOTE — Telephone Encounter (Signed)
 Phone call to patient to review instructions for 13 hr prep for Arthrogram w/ CT contrast on 09/24/23  at 3:30PM. Prescription called into [facility]. Pt aware and verbalized understanding of instructions. Prescription:  Pt to take 50 mg of prednisone  on 09/24/23 at 2:30AM, 50 mg of prednisone  on 09/24/23 at 8:30AM, and 50 mg of prednisone  on 09/24/23 at 2:30PM. Pt is also to take 50 mg of benadryl  on 09/24/23 at 2:30PM. Please call [phone] with any questions.

## 2023-09-24 ENCOUNTER — Inpatient Hospital Stay
Admission: RE | Admit: 2023-09-24 | Discharge: 2023-09-24 | Disposition: A | Discharge status: Home / Self Care | Source: Ambulatory Visit | Attending: Family Medicine | Admitting: Family Medicine

## 2023-09-24 ENCOUNTER — Ambulatory Visit
Admission: RE | Admit: 2023-09-24 | Discharge: 2023-09-24 | Disposition: A | Discharge status: Home / Self Care | Source: Ambulatory Visit | Attending: Family Medicine | Admitting: Family Medicine

## 2023-09-24 DIAGNOSIS — M2550 Pain in unspecified joint: Secondary | ICD-10-CM

## 2023-09-24 DIAGNOSIS — M545 Low back pain, unspecified: Secondary | ICD-10-CM

## 2023-09-24 DIAGNOSIS — M1611 Unilateral primary osteoarthritis, right hip: Secondary | ICD-10-CM | POA: Diagnosis not present

## 2023-09-24 DIAGNOSIS — M5127 Other intervertebral disc displacement, lumbosacral region: Secondary | ICD-10-CM | POA: Diagnosis not present

## 2023-09-24 DIAGNOSIS — M47817 Spondylosis without myelopathy or radiculopathy, lumbosacral region: Secondary | ICD-10-CM | POA: Diagnosis not present

## 2023-09-24 DIAGNOSIS — M25552 Pain in left hip: Secondary | ICD-10-CM

## 2023-09-24 MED ORDER — IOPAMIDOL (ISOVUE-M 200) INJECTION 41%
10.0000 mL | Freq: Once | INTRAMUSCULAR | Status: AC
  Administered 2023-09-24: 10 mL via INTRA_ARTICULAR

## 2023-09-24 NOTE — Procedures (Signed)
 Interventional Radiology Procedure Note  Risks and benefits of joint injection were discussed with the patient including, but not limited to bleeding, infection, injection of contrast outside the joint, and damage to adjacent structures.  All of the patient's questions were answered, patient is agreeable to proceed. Consent signed and in chart.  A timeout was performed with all members of the team prior to start of the procedure. Correct patient and correct procedure was confirmed. Allergies were reviewed.   PROCEDURE SUMMARY:  Successful fluoro guided right hip arthrogram No immediate complications.  Pt tolerated well.   EBL = none  Please see full dictation in imaging section of Epic for procedure details.

## 2023-09-26 ENCOUNTER — Ambulatory Visit: Admitting: Podiatry

## 2023-09-26 ENCOUNTER — Encounter (HOSPITAL_BASED_OUTPATIENT_CLINIC_OR_DEPARTMENT_OTHER): Admitting: Physical Therapy

## 2023-09-27 ENCOUNTER — Other Ambulatory Visit (HOSPITAL_COMMUNITY): Payer: Self-pay

## 2023-10-01 ENCOUNTER — Ambulatory Visit (HOSPITAL_BASED_OUTPATIENT_CLINIC_OR_DEPARTMENT_OTHER): Admitting: Physical Therapy

## 2023-10-10 ENCOUNTER — Ambulatory Visit (HOSPITAL_BASED_OUTPATIENT_CLINIC_OR_DEPARTMENT_OTHER): Attending: Orthopedic Surgery | Admitting: Physical Therapy

## 2023-10-10 ENCOUNTER — Encounter (HOSPITAL_BASED_OUTPATIENT_CLINIC_OR_DEPARTMENT_OTHER): Payer: Self-pay | Admitting: Physical Therapy

## 2023-10-10 DIAGNOSIS — M25512 Pain in left shoulder: Secondary | ICD-10-CM | POA: Insufficient documentation

## 2023-10-10 DIAGNOSIS — M25551 Pain in right hip: Secondary | ICD-10-CM | POA: Insufficient documentation

## 2023-10-10 DIAGNOSIS — M25612 Stiffness of left shoulder, not elsewhere classified: Secondary | ICD-10-CM | POA: Diagnosis not present

## 2023-10-10 NOTE — Therapy (Unsigned)
 OUTPATIENT PHYSICAL THERAPY UPPER EXTREMITY TREATMENT   Patient Name: Tara [name] MRN: [id number] DOB:08-16-1960, 63 y.o., female Today's Date: 10/11/2023  END OF SESSION:  PT End of Session - 10/10/23 1515     Visit Number 35    Number of Visits 39    Date for PT Re-Evaluation 11/23/23    PT Start Time 1515    PT Stop Time 1600    PT Time Calculation (min) 45 min    Activity Tolerance Patient tolerated treatment well;Patient limited by pain    Behavior During Therapy [facility] for tasks assessed/performed                       Past Medical History:  Diagnosis Date   Allergy    Anemia    Arthritis    Hyperlipidemia    Insomnia    Mitral valve prolapse    PONV (postoperative nausea and vomiting)    Past Surgical History:  Procedure Laterality Date   AUGMENTATION MAMMAPLASTY Bilateral    Dec 2019   BREAST BIOPSY Right    axillary node benign   EYE SURGERY  2010   Lasik for distance correction; use one contact in left eye for reading now   JOINT REPLACEMENT  2021; 2022; 2025   reverse shoulder on the right; anatomic replacement on the left then conversion to reverse replacement due to failed anatomic   KNEE SURGERY  1988   right   SHOULDER ARTHROSCOPY WITH LABRAL REPAIR Right 08/28/2012   Procedure: RIGHT SHOULDER ARTHROSCOPY WITH LABRAL DEBRIDEMENT, ASPIRATION AND DECOMPRESSION OF PARALABRAL CYST;  Surgeon: Franky CHRISTELLA Pointer, MD;  Location: [facility];  Service: Orthopedics;  Laterality: Right;   TOTAL SHOULDER ARTHROPLASTY Right 03/24/2020   Procedure: RIGHT REVERSE SHOULDER ARTHROPLASTY;  Surgeon: Pointer Franky, MD;  Location: [facility];  Service: Orthopedics;  Laterality: Right;    TOTAL SHOULDER ARTHROPLASTY Left 02/23/2021   Procedure: TOTAL SHOULDER ARTHROPLASTY;  Surgeon: Pointer Franky, MD;  Location: [facility];  Service: Orthopedics;  Laterality: Left;   TOTAL SHOULDER REVISION Left 02/07/2023   Procedure: Excision Left shoulder nodule; Conversion of  left anatomic shoulder arthroplasty to reverse shoulder arthroplasty;  Surgeon: Pointer Franky, MD;  Location: [facility];  Service: Orthopedics;  Laterality: Left;   Patient Active Problem List   Diagnosis Date Noted   B12 deficiency 09/03/2023   Onychomycosis 05/10/2023   Hypercalciuria 02/02/2023   DDD (degenerative disc disease), cervical 04/13/2021   Osteoporosis without current pathological fracture 10/28/2020   Nonallopathic lesion of sacral region 02/26/2020   Hot flashes 02/23/2019   Other insomnia 02/23/2019   Cystic acne vulgaris 02/23/2019   Bunion of right foot 02/23/2019   Varicose 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 02/23/2019   Multiple atypical nevi 02/23/2019   Mitral valve prolapse 03/03/2009    PCP: Roxan [name]   REFERRING PROVIDER: JONETTA Franky Supple (shoulder) Arthea Sharps, DO (hip)  REFERRING DIAG:  Diagnosis  [phone] (ICD-10-CM) - Presence of left artificial shoulder joint  Pain right hip  THERAPY DIAG:  Stiffness of left shoulder, not elsewhere classified  Acute pain of left shoulder  Pain in right hip  Rationale for Evaluation and Treatment: Rehabilitation  ONSET DATE:  DOS: 02/07/2023  SUBJECTIVE:  SUBJECTIVE STATEMENT: Imaging results are in, no tears but significant and widespread inflammation through pelvis and bilateral hips. Working really hard on posture but it is significantly difficult to hold for more than just a few seconds.    From Initial Evaluation: Patient had a left standard total shoulder replacement last year.  She did well for about 6 months then began having progressive pain in the shoulder.  She had to have a shoulder revision with a reverse total shoulder.  At this time her pain is well-controlled.  She is also had a right shoulder  replaced.  When she had that replaced she had significant pain.  She reports this 1 is better.  She is wearing her sling outside the house and when sleeping.  She is otherwise has been wearing her sling.  She has increased pain when she moves her arm.  She has been compliant with her HEP so far. Hand dominance: Right  PERTINENT HISTORY: Multijoint arthritis including knees and hips.  Osteoporosis, right shoulder replacement, mitral valve prolapse  PAIN:  Are you having pain? Yes: NPRS scale: 0/10  Pain location: hips to neck Pain description: sore Aggravating factors: Use of the arm Relieving factors: Not using the arm is much  PRECAUTIONS: Shoulder  RED FLAGS: None   WEIGHT BEARING RESTRICTIONS: No  FALLS:  Has patient fallen in last 6 months? No  LIVING ENVIRONMENT:   OCCUPATION: Nurse   PLOF: Independent  PATIENT GOALS:  Patient wants to be functional   OBJECTIVE:  Note: Objective measures were completed at Evaluation unless otherwise noted. IMAGING:  MRI Rt hip 8/21 IMPRESSION: 1. Mild right hip osteoarthritis. 2. Bilateral hamstring and gluteal tendinosis. 3. Mild peritrochanteric bursitis bilaterally. 4. Small volume free fluid within the pelvis, nonspecific.  MRI lumbar 8/12 IMPRESSION: 1. Mild canal stenosis at L3-L4. 2. Moderate left and mild right foraminal stenosis at L5-S1 secondary to disc bulging and facet arthropathy.   PATIENT SURVEYS :  UEFI 40/80   UPPER EXTREMITY ROM:   Passive ROM Right eval Left eval Left 1/21 L  L  Shoulder flexion  85 106 130AROM 160PROM  148AROM supine 158PROM  Shoulder extension       Shoulder abduction     112 active supine  Shoulder adduction       Shoulder internal rotation  Can sit on body comfortably   L gute  65/L iliac crest but can get to L4 with some compensation  Shoulder external rotation  20 26  50/T3  Elbow flexion       Elbow extension       Wrist flexion       Wrist extension       Wrist  ulnar deviation       Wrist radial deviation       Wrist pronation       Wrist supination       (Blank rows = not tested)  UPPER EXTREMITY MMT:  MMT Right 3/25 Left 3/25 Lt 4/22  Shoulder flexion 15.4 8.2 9.5  Shoulder extension     Shoulder abduction 9.9 6.8 7.3  Shoulder adduction     Shoulder internal rotation 11.3 8.8 6.4  Shoulder external rotation 11.5 8.1 7.8  (Blank rows = not tested)    8/21- did not take strength tests for LEs due to high levels of pain   PALPATION:  No unexpected TTP    LUMBAR SCREEN 7/3:  Negative change in standing extension, increased pain in flexion  Negative  for change in prone on elbows static or dynamic  Negative for change with lumbar mobilizations  Negative SLR passive, positive for concordant pain in active SLR  Tightness in hamstrings & lateral hip  Negative concordant pain in resisted hamstring MMT or hip ext MMT  Negative concordant pain in sidelying hip abd (but is weak), proximal HS pain with sidelying hip abd+hip flexion  Negative cocordant pain with supine march bent knee, positive concordant pain active SLR on Rt (no change with movement of left leg)  Decreased concordant pain for a short amount of time following IASTM to hamstrings & STM to glut med/min                                                                                                                            TREATMENT DATE:  8/21 Discussion regarding anatomy of condition and findings of imaging to modify HEP and discuss pacing activities, intensity of activities, safe movements to reduce symptom exacerbation Walking vs cycling discussion for lower impact exercise Core activation progression paired with breathing- legs over bolster, feet on table, quadruped; all for neutral core engagement and natural spinal- pelvic posture    7/24 IASTM bilateral incision site anterior shoulder Supine ecc horiz abd 2lb Supine UE scissors 2lb Sidelying row- anterior and  table anchor, red tband Sidelying ER anterior anchor, red tband Qped row + triceps kick 2lb Reviewed plank and oblique V-ups form  7/10 Supine iso hamstring curl, iso add with relaxed glutes & core- also alternating with breathing  STM/roller to Rt lateral hip and hamstrings  7/3 Re-eval & testing IASTM hamstrings, bilateral shoulder incisions STM Rt lateral hip  Treatment                            6/24: Blank lines following charge title = not provided on this treatment date.   Manual:  TPDN YES Trigger Point Dry Needling  Subsequent Treatment: Instructions provided previously at initial dry needling treatment.   Patient Verbal Consent Given: Yes Education Handout Provided: Previously Provided Muscles Treated: Rt glut med Electrical Stimulation Performed: No Treatment Response/Outcome: twitch, recreated distal concordant symptoms PROM hip STM pectineus, glut med/min testing of activation with post-inf glide of hip from 45 deg passive hamstring stretch- no symptoms passively and decreased with activation +glide  There-ex:  There-Act: Car seat adjustments to improve thigh contact to seat and decrease post pelvic tilt- will see if head rest can be adjusted to reduce forward head posture.  Self Care: Symptom review & POC discussion    PATIENT EDUCATION: Education details:  HEP, exercise form, rationale of interventions, protocol restrictions and limitations, protocol ranges, and ROM findings Person educated: Patient Education method: Explanation, Demonstration, Tactile cues, Verbal cues Education comprehension: verbalized understanding, returned demonstration, verbal cues required, tactile cues required, and needs further education  HOME EXERCISE PROGRAM: Access Code: [email or web address] URL: [email or web address]   ASSESSMENT:  CLINICAL IMPRESSION: 2 weeks  cycling rather than walking. Unexplained increase in gross inflammation. In core activation, demonstrated  rib cage depression and distension of lower abdomen indicating poor abdominal control in exercises and functional activities- will continue to benefit from skilled PT to address and meet functional goals.     OBJECTIVE IMPAIRMENTS:  decreased ROM, decreased strength, impaired UE functional use, and pain.   ACTIVITY LIMITATIONS: carrying, lifting, sleeping, bathing, toileting, dressing, self feeding, reach over head, and hygiene/grooming  PARTICIPATION LIMITATIONS: meal prep, cleaning, laundry, driving, shopping, community activity, occupation, and yard work  PERSONAL FACTORS: 1-2 comorbidities: previous replacement on the same shoulder osteoporosis; multi joint OA  are also affecting patient's functional outcome.   REHAB POTENTIAL: Good  CLINICAL DECISION MAKING: Stable/uncomplicated  EVALUATION COMPLEXITY: Low  GOALS: Goals reviewed with patient? Yes  SHORT TERM GOALS: Target date: 04/16/2023    Patient will increase passive flexion to 115 degrees  Baseline: Goal status: MET  2.  Patient will increase left shoulder  ER to 30 degrees  Baseline:  Goal status: MET  3.  Patient will use her left arm for ADL's below 90 degrees with > 2/10 pain  Baseline:  Goal status: MET   LONG TERM GOALS: Target date: POC DATE    The patient will reach overhead with left arm without pain in order to perform ADL's  Baseline:  Goal status: MET  2.  Patient will each behind her head without pain in order to per from her ADL's  Baseline:  Goal status: MET  3.  Patient will reach behind her back to L3 without pain in order to perform ADL's  Baseline:  Goal status: MET  4. Able to demo overhead activity without scapular winging bilaterally  Status: MET 5. Strength available for ADLs with weight/resistance such as washing dishes, vacuuming   Status: ongoing- has met for shoulder but limited by hip/back/buttock pain 6. Feel prepared to lift grand children when I have them.   Status:  New 7. Able to sit for at least 10 minutes without needing to stand due to pain  Status: New 8. Demonstrate proper core activation in exercises for lumbopelvic support  Status: New   PLAN: PT FREQUENCY: 2x/week  PT DURATION: 8 weeks  PLANNED INTERVENTIONS: 97110-Therapeutic exercises, 97530- Therapeutic activity, [id number]- Neuromuscular re-education, 97535- Self Care, [id number]- Manual therapy, 7113- Aquatic Therapy, 97014- Electrical stimulation (unattended), 97035- Ultrasound, Patient/Family education, Taping, Dry Needling, DME instructions, Cryotherapy, and Moist heat   PLAN FOR NEXT SESSION:  Cont gross shoulder stability, how did 2 weeks of cycling go (in place of walking for exercise)?  Review core contraction.    Shermar Friedland C. Deseray Daponte PT, DPT 10/11/23 8:03 PM

## 2023-10-11 ENCOUNTER — Encounter: Payer: Self-pay | Admitting: Dietician

## 2023-10-11 ENCOUNTER — Encounter: Attending: Internal Medicine | Admitting: Dietician

## 2023-10-11 ENCOUNTER — Encounter (HOSPITAL_BASED_OUTPATIENT_CLINIC_OR_DEPARTMENT_OTHER): Payer: Self-pay | Admitting: Physical Therapy

## 2023-10-11 DIAGNOSIS — E785 Hyperlipidemia, unspecified: Secondary | ICD-10-CM | POA: Insufficient documentation

## 2023-10-11 NOTE — Patient Instructions (Addendum)
 Cooking for Peanuts Plant you Vonzell Avena MD - Easy black bean burgers Whole food plant based cooking show  Dr. Gregor's daily dozen Complement vitamin/protein  [facility] sleep section

## 2023-10-11 NOTE — Progress Notes (Signed)
 Medical Nutrition Therapy  Appointment Start time:  1330  Appointment End time:  1430 Patient is here today alone.  She was last seen by this RD 07/12/2023  09/2023 She wishes to maintain her weight due to issues in her knees. She is eating well with increased variety and is meeting her protein, carbohydrate, and vitamin needs overall.  She is aiming for 25 grams of protein per meal.   She continues to have problems with sleep and has further questions on this.  She falls asleep early (about 8 am) but wakes after 2 hours and it is difficult to get adequate sleep.  She is getting only 2-3 hours sleep per night most nights - significant issues with sleep duration.  She has stopped the Melatonin, gaba and l-threonine supplement.  She continues the magnesium glycinate and magnesium min but this does not seem to have helped.  She gets up and reads or drinks herbal tea when she wakes.   She has further questions about food. She is trying not to snack as much.  06/2023 She states that she has learned from the Full Plate Living program information from ACL[facility] provided at her last visit and has participated in the online programs and participated in the LYFT project which is a series on overall well being and mental health. She states that she is eating similarly and states that she is feeling quite food obsessed and finishes each meal with a PB powder.  She is exercising for bone health and strength but also due to balancing overeating PB powder. Weight is stable.  Patient states that she feels best <140 lbs.  She states that she is unable to stop eating the PB powder at this time. She continues to have problems waking in the middle of the night and not being able to return to sleep.  She is taking magnesium and other supplements including melatonin but this continues.  Her sleep hygiene is very good.  Exercises in the am. Continues to have problems with menopause  Primary concerns today: wants to know how  to not have to work so hard to keep weight undercontrol, hypercalciuria,  tips to get her body in the best shape for shoulder surgery. She discussed that since menopause, she gets nauseous when she gets hot flashes and eating helps prevent this.  She states that she may eat at times to prevent this rather than hunger.  She states that she feels like she is exercising too much to maintain her weight and lacks time to do other things she enjoys. Referral diagnosis: [employer] - no referral required Preferred learning style: no preference indicated Learning readiness: ready, change in progress)  NUTRITION ASSESSMENT  Anthropometrics 70 139 lbs 10/11/2023.  Feels this is her goal weight.  She states that maintaining this weight is very difficult.  She states that she physically feels best at this weight (spine, shoulders, knees). 143 lbs 07/12/2023 144 lbs 11/23/2022 156 lbs 2015 Feels best (joints) when her weight is <140 lbs  Clinical Medical Hx: osteoarthritis, osteoporosis, hypercalciuria, HLD Medications: see list to include crestor  Labs: Urine Calcium  334 on 11/2021, Vitamin D  32 on 10/25023, Vitamin B-12 744 on 12/25/2021 Notable Signs/Symptoms: Joints hurt and body hurts when she gets in the higher 140 range.  Lifestyle & Dietary Hx She is a [employer].  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  Patient lives with her husband.  Daily fluid intake: adequate Supplements: psyillium in unsweetened almond milk, MVI, Vitamin D , Omega 3, Calcium , mindful cocoa (contains melatonin) Sleep: 4 hours broken  Stress / self-care: high stress, good self care Current average weekly physical activity: M, W, F - free weights for 30 minutes or more and 15 minutes of pilaties, restorative yoga on Fridays, running Tuesday and Thursday 3-4 miles and Saturday 6 miles. She has now increased her running 2 miles per day that she runs and light  weights on M, W, F.  24-Hr Dietary Recall No dairy, no animal protein (Dr. suggested avoiding added salt, dairy and animal protein due to hypercalciuria.)  Will eat bread that she makes of Ezekiel bread. First Meal: salad with kale, spinach, chick peas, celery, mushrooms, tomatoes, kimchi, watermelon, 1 cup ripple milk with 1/2 protein powder, [place] crackers, avocado OR PB powder (reconstituted with cocoa powder and water ),high fiber wrap with avocado, spinach, apple, broccoli, carrots, hummus Snack: none Second Meal: broccoli slaw, cranberries, pumpkin seeds, mustard vinegar and honey dressing, wasa, avocado, banana, 4 T PB reconstituted PB2 with cocoa or chocolate protein powder, almond milk Snack: Foragers protein yogurt with cocoa powder Third Meal:  [employer] pasta, tomato sauce, black beans, onions, mushrooms, tomatoes, salad Snack: none Beverages: water , Ripple milk mixed with cocoa, almond milk, Bubbly, herbal tea, protein powder  Estimated Energy Needs Calories: 1900-2100 Protein: 85-95g  NUTRITION DIAGNOSIS  NB-1.1 Food and nutrition-related knowledge deficit As related to balance of carbohydrate, protein, and fat.  As evidenced by diet hx and patient report.   NUTRITION INTERVENTION  Nutrition education (E-1) on the following topics:  Review of current intake  Discussed her supplement use Discussed sleep  Discussed nutrition adequacy Importance of weight maintenance and continued exercise for maintenance of muscle. Recipe ideas  Handouts Provided Include  [facility] [employer] developer of Lifestyle Medicine) packet (initial visit) [facility]M sleep section from board review manual Resources:  Cooking for peanuts, plant you, Vonzell Avena MD - easy black bean burgers, whole food plant based cooking show, Dr. Evelia daily dozen, complement vitamins/protein  Learning Style & Readiness for Change Teaching method utilized: Visual & Auditory  Demonstrated degree of understanding via: Teach Back   Barriers to learning/adherence to lifestyle change: none  Goals Established by Pt Continue to eat mindfully Consider eating for a while (30 days?) without purposeful restriction, paying attention to how your body feels, and not using judgement regarding food.  How do you feel?  Where did you land.   Continue a whole foods, plant based diet You are continuing to obtain adequate protein Continue to stay active  MONITORING & EVALUATION Dietary intake, weekly physical activity prn  Next Steps  Patient is to call for questions.

## 2023-10-17 ENCOUNTER — Encounter (HOSPITAL_BASED_OUTPATIENT_CLINIC_OR_DEPARTMENT_OTHER): Admitting: Physical Therapy

## 2023-10-18 ENCOUNTER — Ambulatory Visit

## 2023-10-24 ENCOUNTER — Ambulatory Visit (HOSPITAL_BASED_OUTPATIENT_CLINIC_OR_DEPARTMENT_OTHER): Attending: Orthopedic Surgery | Admitting: Physical Therapy

## 2023-10-24 ENCOUNTER — Encounter (HOSPITAL_BASED_OUTPATIENT_CLINIC_OR_DEPARTMENT_OTHER): Payer: Self-pay | Admitting: Physical Therapy

## 2023-10-24 DIAGNOSIS — M25512 Pain in left shoulder: Secondary | ICD-10-CM | POA: Insufficient documentation

## 2023-10-24 DIAGNOSIS — M25612 Stiffness of left shoulder, not elsewhere classified: Secondary | ICD-10-CM | POA: Insufficient documentation

## 2023-10-24 DIAGNOSIS — M25551 Pain in right hip: Secondary | ICD-10-CM | POA: Insufficient documentation

## 2023-10-24 NOTE — Therapy (Signed)
 OUTPATIENT PHYSICAL THERAPY UPPER EXTREMITY TREATMENT   Patient Name: Tara [name] MRN: [id number] DOB:23-Dec-1960, 63 y.o., female Today's Date: 10/24/2023  END OF SESSION:  PT End of Session - 10/24/23 1638     Visit Number 36    Number of Visits 39    Date for PT Re-Evaluation 11/23/23    PT Start Time 1604    PT Stop Time 1638    PT Time Calculation (min) 34 min    Activity Tolerance Patient tolerated treatment well    Behavior During Therapy [facility] for tasks assessed/performed                        Past Medical History:  Diagnosis Date   Allergy    Anemia    Arthritis    Hyperlipidemia    Insomnia    Mitral valve prolapse    PONV (postoperative nausea and vomiting)    Past Surgical History:  Procedure Laterality Date   AUGMENTATION MAMMAPLASTY Bilateral    Dec 2019   BREAST BIOPSY Right    axillary node benign   EYE SURGERY  2010   Lasik for distance correction; use one contact in left eye for reading now   JOINT REPLACEMENT  2021; 2022; 2025   reverse shoulder on the right; anatomic replacement on the left then conversion to reverse replacement due to failed anatomic   KNEE SURGERY  1988   right   SHOULDER ARTHROSCOPY WITH LABRAL REPAIR Right 08/28/2012   Procedure: RIGHT SHOULDER ARTHROSCOPY WITH LABRAL DEBRIDEMENT, ASPIRATION AND DECOMPRESSION OF PARALABRAL CYST;  Surgeon: Franky CHRISTELLA Pointer, MD;  Location: [facility];  Service: Orthopedics;  Laterality: Right;   TOTAL SHOULDER ARTHROPLASTY Right 03/24/2020   Procedure: RIGHT REVERSE SHOULDER ARTHROPLASTY;  Surgeon: Pointer Franky, MD;  Location: [facility];  Service: Orthopedics;  Laterality: Right;    TOTAL SHOULDER ARTHROPLASTY Left 02/23/2021   Procedure: TOTAL SHOULDER ARTHROPLASTY;  Surgeon: Pointer Franky, MD;  Location: [facility];  Service: Orthopedics;  Laterality: Left;   TOTAL SHOULDER REVISION Left 02/07/2023   Procedure: Excision Left shoulder nodule; Conversion of left anatomic shoulder  arthroplasty to reverse shoulder arthroplasty;  Surgeon: Pointer Franky, MD;  Location: [facility];  Service: Orthopedics;  Laterality: Left;   Patient Active Problem List   Diagnosis Date Noted   B12 deficiency 09/03/2023   Onychomycosis 05/10/2023   Hypercalciuria 02/02/2023   DDD (degenerative disc disease), cervical 04/13/2021   Osteoporosis without current pathological fracture 10/28/2020   Nonallopathic lesion of sacral region 02/26/2020   Hot flashes 02/23/2019   Other insomnia 02/23/2019   Cystic acne vulgaris 02/23/2019   Bunion of right foot 02/23/2019   Varicose 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 02/23/2019   Multiple atypical nevi 02/23/2019   Mitral valve prolapse 03/03/2009    PCP: Roxan [name]   REFERRING PROVIDER: JONETTA Franky Supple (shoulder) Arthea Sharps, DO (hip)  REFERRING DIAG:  Diagnosis  [phone] (ICD-10-CM) - Presence of left artificial shoulder joint  Pain right hip  THERAPY DIAG:  Stiffness of left shoulder, not elsewhere classified  Acute pain of left shoulder  Pain in right hip  Rationale for Evaluation and Treatment: Rehabilitation  ONSET DATE:  DOS: 02/07/2023  SUBJECTIVE:  SUBJECTIVE STATEMENT: I tried what you told me but no change. I have been working really hard on the lower abdominal engagement.   From Initial Evaluation: Patient had a left standard total shoulder replacement last year.  She did well for about 6 months then began having progressive pain in the shoulder.  She had to have a shoulder revision with a reverse total shoulder.  At this time her pain is well-controlled.  She is also had a right shoulder replaced.  When she had that replaced she had significant pain.  She reports this 1 is better.  She is wearing her sling outside the house and  when sleeping.  She is otherwise has been wearing her sling.  She has increased pain when she moves her arm.  She has been compliant with her HEP so far. Hand dominance: Right  PERTINENT HISTORY: Multijoint arthritis including knees and hips.  Osteoporosis, right shoulder replacement, mitral valve prolapse  PAIN:  Are you having pain? Yes: NPRS scale: 0/10  Pain location: hips to neck Pain description: sore Aggravating factors: Use of the arm Relieving factors: Not using the arm is much  PRECAUTIONS: Shoulder  RED FLAGS: None   WEIGHT BEARING RESTRICTIONS: No  FALLS:  Has patient fallen in last 6 months? No  LIVING ENVIRONMENT:   OCCUPATION: Nurse   PLOF: Independent  PATIENT GOALS:  Patient wants to be functional   OBJECTIVE:  Note: Objective measures were completed at Evaluation unless otherwise noted. IMAGING:  MRI Rt hip 8/21 IMPRESSION: 1. Mild right hip osteoarthritis. 2. Bilateral hamstring and gluteal tendinosis. 3. Mild peritrochanteric bursitis bilaterally. 4. Small volume free fluid within the pelvis, nonspecific.  MRI lumbar 8/12 IMPRESSION: 1. Mild canal stenosis at L3-L4. 2. Moderate left and mild right foraminal stenosis at L5-S1 secondary to disc bulging and facet arthropathy.   PATIENT SURVEYS :  UEFI 40/80   UPPER EXTREMITY ROM:   Passive ROM Right eval Left eval Left 1/21 L  L  Shoulder flexion  85 106 130AROM 160PROM  148AROM supine 158PROM  Shoulder extension       Shoulder abduction     112 active supine  Shoulder adduction       Shoulder internal rotation  Can sit on body comfortably   L gute  65/L iliac crest but can get to L4 with some compensation  Shoulder external rotation  20 26  50/T3  Elbow flexion       Elbow extension       Wrist flexion       Wrist extension       Wrist ulnar deviation       Wrist radial deviation       Wrist pronation       Wrist supination       (Blank rows = not tested)  UPPER  EXTREMITY MMT:  MMT Right 3/25 Left 3/25 Lt 4/22  Shoulder flexion 15.4 8.2 9.5  Shoulder extension     Shoulder abduction 9.9 6.8 7.3  Shoulder adduction     Shoulder internal rotation 11.3 8.8 6.4  Shoulder external rotation 11.5 8.1 7.8  (Blank rows = not tested)    8/21- did not take strength tests for LEs due to high levels of pain   PALPATION:  No unexpected TTP    LUMBAR SCREEN 7/3:  Negative change in standing extension, increased pain in flexion  Negative for change in prone on elbows static or dynamic  Negative for change with lumbar mobilizations  Negative SLR passive, positive for concordant pain in active SLR  Tightness in hamstrings & lateral hip  Negative concordant pain in resisted hamstring MMT or hip ext MMT  Negative concordant pain in sidelying hip abd (but is weak), proximal HS pain with sidelying hip abd+hip flexion  Negative cocordant pain with supine march bent knee, positive concordant pain active SLR on Rt (no change with movement of left leg)  Decreased concordant pain for a short amount of time following IASTM to hamstrings & STM to glut med/min                                                                                                                            TREATMENT DATE:  9/4 STM Rt internal obliques & use of 1/2 foam roll for self massage STM bilat glutes- no innominate rotated found today, good pelvic spring in rotation bilaterally.   8/21 Discussion regarding anatomy of condition and findings of imaging to modify HEP and discuss pacing activities, intensity of activities, safe movements to reduce symptom exacerbation Walking vs cycling discussion for lower impact exercise Core activation progression paired with breathing- legs over bolster, feet on table, quadruped; all for neutral core engagement and natural spinal- pelvic posture    7/24 IASTM bilateral incision site anterior shoulder Supine ecc horiz abd 2lb Supine UE  scissors 2lb Sidelying row- anterior and table anchor, red tband Sidelying ER anterior anchor, red tband Qped row + triceps kick 2lb Reviewed plank and oblique V-ups form  7/10 Supine iso hamstring curl, iso add with relaxed glutes & core- also alternating with breathing  STM/roller to Rt lateral hip and hamstrings  7/3 Re-eval & testing IASTM hamstrings, bilateral shoulder incisions STM Rt lateral hip  Treatment                            6/24: Blank lines following charge title = not provided on this treatment date.   Manual:  TPDN YES Trigger Point Dry Needling  Subsequent Treatment: Instructions provided previously at initial dry needling treatment.   Patient Verbal Consent Given: Yes Education Handout Provided: Previously Provided Muscles Treated: Rt glut med Electrical Stimulation Performed: No Treatment Response/Outcome: twitch, recreated distal concordant symptoms PROM hip STM pectineus, glut med/min testing of activation with post-inf glide of hip from 45 deg passive hamstring stretch- no symptoms passively and decreased with activation +glide  There-ex:  There-Act: Car seat adjustments to improve thigh contact to seat and decrease post pelvic tilt- will see if head rest can be adjusted to reduce forward head posture.  Self Care: Symptom review & POC discussion    PATIENT EDUCATION: Education details:  HEP, exercise form, rationale of interventions, protocol restrictions and limitations, protocol ranges, and ROM findings Person educated: Patient Education method: Explanation, Demonstration, Tactile cues, Verbal cues Education comprehension: verbalized understanding, returned demonstration, verbal cues required, tactile cues required, and needs further education  HOME EXERCISE PROGRAM:  Access Code: [email or web address] URL: [email or web address]   ASSESSMENT:  CLINICAL IMPRESSION: Concordatn tightness found in palpation to tension in Rt internal  obliques and nearby glut attachment. Will work on this at home along with Rt external oblique activation. POC to every 2 weeks through Thanksgiving. Still unknown origin of gross inflammatory reaction.     OBJECTIVE IMPAIRMENTS:  decreased ROM, decreased strength, impaired UE functional use, and pain.   ACTIVITY LIMITATIONS: carrying, lifting, sleeping, bathing, toileting, dressing, self feeding, reach over head, and hygiene/grooming  PARTICIPATION LIMITATIONS: meal prep, cleaning, laundry, driving, shopping, community activity, occupation, and yard work  PERSONAL FACTORS: 1-2 comorbidities: previous replacement on the same shoulder osteoporosis; multi joint OA  are also affecting patient's functional outcome.   REHAB POTENTIAL: Good  CLINICAL DECISION MAKING: Stable/uncomplicated  EVALUATION COMPLEXITY: Low  GOALS: Goals reviewed with patient? Yes  SHORT TERM GOALS: Target date: 04/16/2023    Patient will increase passive flexion to 115 degrees  Baseline: Goal status: MET  2.  Patient will increase left shoulder  ER to 30 degrees  Baseline:  Goal status: MET  3.  Patient will use her left arm for ADL's below 90 degrees with > 2/10 pain  Baseline:  Goal status: MET   LONG TERM GOALS: Target date: POC DATE    The patient will reach overhead with left arm without pain in order to perform ADL's  Baseline:  Goal status: MET  2.  Patient will each behind her head without pain in order to per from her ADL's  Baseline:  Goal status: MET  3.  Patient will reach behind her back to L3 without pain in order to perform ADL's  Baseline:  Goal status: MET  4. Able to demo overhead activity without scapular winging bilaterally  Status: MET 5. Strength available for ADLs with weight/resistance such as washing dishes, vacuuming   Status: ongoing- has met for shoulder but limited by hip/back/buttock pain 6. Feel prepared to lift grand children when I have them.   Status:  New 7. Able to sit for at least 10 minutes without needing to stand due to pain  Status: New 8. Demonstrate proper core activation in exercises for lumbopelvic support  Status: New   PLAN: PT FREQUENCY: 2x/week  PT DURATION: 8 weeks  PLANNED INTERVENTIONS: 97110-Therapeutic exercises, 97530- Therapeutic activity, [id number]- Neuromuscular re-education, 97535- Self Care, [id number]- Manual therapy, 7113- Aquatic Therapy, 97014- Electrical stimulation (unattended), 97035- Ultrasound, Patient/Family education, Taping, Dry Needling, DME instructions, Cryotherapy, and Moist heat   PLAN FOR NEXT SESSION:    Review core contraction.    Austyn Seier C. Jamae Tison PT, DPT 10/24/23 4:48 PM

## 2023-10-25 ENCOUNTER — Encounter (HOSPITAL_BASED_OUTPATIENT_CLINIC_OR_DEPARTMENT_OTHER): Payer: Self-pay | Admitting: Physical Therapy

## 2023-10-25 ENCOUNTER — Ambulatory Visit
Admission: RE | Admit: 2023-10-25 | Discharge: 2023-10-25 | Disposition: A | Discharge status: Home / Self Care | Source: Ambulatory Visit | Attending: Family | Admitting: Family

## 2023-10-25 DIAGNOSIS — Z1231 Encounter for screening mammogram for malignant neoplasm of breast: Secondary | ICD-10-CM

## 2023-10-30 ENCOUNTER — Encounter: Payer: Self-pay | Admitting: Internal Medicine

## 2023-10-30 DIAGNOSIS — M810 Age-related osteoporosis without current pathological fracture: Secondary | ICD-10-CM

## 2023-10-31 ENCOUNTER — Encounter (HOSPITAL_BASED_OUTPATIENT_CLINIC_OR_DEPARTMENT_OTHER): Payer: Self-pay | Admitting: Physical Therapy

## 2023-10-31 ENCOUNTER — Ambulatory Visit (HOSPITAL_BASED_OUTPATIENT_CLINIC_OR_DEPARTMENT_OTHER): Admitting: Physical Therapy

## 2023-10-31 DIAGNOSIS — M25612 Stiffness of left shoulder, not elsewhere classified: Secondary | ICD-10-CM | POA: Diagnosis not present

## 2023-10-31 DIAGNOSIS — M25512 Pain in left shoulder: Secondary | ICD-10-CM

## 2023-10-31 DIAGNOSIS — M25551 Pain in right hip: Secondary | ICD-10-CM | POA: Diagnosis not present

## 2023-10-31 NOTE — Therapy (Unsigned)
 OUTPATIENT PHYSICAL THERAPY UPPER EXTREMITY TREATMENT   Patient Name: Tara [name] MRN: [id number] DOB:[birth year], 63 y.o., female Today's Date: 11/01/2023  END OF SESSION:  PT End of Session - 10/31/23 1516     Visit Number 37    Number of Visits 39    Date for PT Re-Evaluation 11/23/23    PT Start Time 1515    PT Stop Time 1600    PT Time Calculation (min) 45 min    Activity Tolerance Patient tolerated treatment well    Behavior During Therapy [facility] for tasks assessed/performed                        Past Medical History:  Diagnosis Date   Allergy    Anemia    Arthritis    Hyperlipidemia    Insomnia    Mitral valve prolapse    PONV (postoperative nausea and vomiting)    Past Surgical History:  Procedure Laterality Date   AUGMENTATION MAMMAPLASTY Bilateral    Dec 2019   BREAST BIOPSY Right    axillary node benign   EYE SURGERY  2010   Lasik for distance correction; use one contact in left eye for reading now   JOINT REPLACEMENT  2021; 2022; 2025   reverse shoulder on the right; anatomic replacement on the left then conversion to reverse replacement due to failed anatomic   KNEE SURGERY  1988   right   SHOULDER ARTHROSCOPY WITH LABRAL REPAIR Right 08/28/2012   Procedure: RIGHT SHOULDER ARTHROSCOPY WITH LABRAL DEBRIDEMENT, ASPIRATION AND DECOMPRESSION OF PARALABRAL CYST;  Surgeon: Franky CHRISTELLA Pointer, MD;  Location: [facility];  Service: Orthopedics;  Laterality: Right;   TOTAL SHOULDER ARTHROPLASTY Right 03/24/2020   Procedure: RIGHT REVERSE SHOULDER ARTHROPLASTY;  Surgeon: Pointer Franky, MD;  Location: [facility];  Service: Orthopedics;  Laterality: Right;    TOTAL SHOULDER ARTHROPLASTY Left 02/23/2021   Procedure: TOTAL SHOULDER ARTHROPLASTY;  Surgeon: Pointer Franky, MD;  Location: [facility];  Service: Orthopedics;  Laterality: Left;   TOTAL SHOULDER REVISION Left 02/07/2023   Procedure: Excision Left shoulder nodule; Conversion of left anatomic shoulder  arthroplasty to reverse shoulder arthroplasty;  Surgeon: Pointer Franky, MD;  Location: [facility];  Service: Orthopedics;  Laterality: Left;   Patient Active Problem List   Diagnosis Date Noted   B12 deficiency 09/03/2023   Onychomycosis 05/10/2023   Hypercalciuria 02/02/2023   DDD (degenerative disc disease), cervical 04/13/2021   Osteoporosis without current pathological fracture 10/28/2020   Nonallopathic lesion of sacral region 02/26/2020   Hot flashes 02/23/2019   Other insomnia 02/23/2019   Cystic acne vulgaris 02/23/2019   Bunion of right foot 02/23/2019   Varicose 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 02/23/2019   Multiple atypical nevi 02/23/2019   Mitral valve prolapse 03/03/2009    PCP: Roxan [name]   REFERRING PROVIDER: JONETTA Franky Supple (shoulder) Arthea Sharps, DO (hip)  REFERRING DIAG:  Diagnosis  [phone] (ICD-10-CM) - Presence of left artificial shoulder joint  Pain right hip  THERAPY DIAG:  Stiffness of left shoulder, not elsewhere classified  Acute pain of left shoulder  Pain in right hip  Rationale for Evaluation and Treatment: Rehabilitation  ONSET DATE:  DOS: 02/07/2023  SUBJECTIVE:  SUBJECTIVE STATEMENT: Able to maintain a decrease in pain from obliques.   From Initial Evaluation: Patient had a left standard total shoulder replacement last year.  She did well for about 6 months then began having progressive pain in the shoulder.  She had to have a shoulder revision with a reverse total shoulder.  At this time her pain is well-controlled.  She is also had a right shoulder replaced.  When she had that replaced she had significant pain.  She reports this 1 is better.  She is wearing her sling outside the house and when sleeping.  She is otherwise has been wearing her  sling.  She has increased pain when she moves her arm.  She has been compliant with her HEP so far. Hand dominance: Right  PERTINENT HISTORY: Multijoint arthritis including knees and hips.  Osteoporosis, right shoulder replacement, mitral valve prolapse  PAIN:  Are you having pain? Yes: NPRS scale: 0/10  Pain location: hips to neck Pain description: sore Aggravating factors: Use of the arm Relieving factors: Not using the arm is much  PRECAUTIONS: Shoulder  RED FLAGS: None   WEIGHT BEARING RESTRICTIONS: No  FALLS:  Has patient fallen in last 6 months? No  LIVING ENVIRONMENT:   OCCUPATION: Nurse   PLOF: Independent  PATIENT GOALS:  Patient wants to be functional   OBJECTIVE:  Note: Objective measures were completed at Evaluation unless otherwise noted. IMAGING:  MRI Rt hip 8/21 IMPRESSION: 1. Mild right hip osteoarthritis. 2. Bilateral hamstring and gluteal tendinosis. 3. Mild peritrochanteric bursitis bilaterally. 4. Small volume free fluid within the pelvis, nonspecific.  MRI lumbar 8/12 IMPRESSION: 1. Mild canal stenosis at L3-L4. 2. Moderate left and mild right foraminal stenosis at L5-S1 secondary to disc bulging and facet arthropathy.   PATIENT SURVEYS :  UEFI 40/80   UPPER EXTREMITY ROM:   Passive ROM Right eval Left eval Left 1/21 L  L  Shoulder flexion  85 106 130AROM 160PROM  148AROM supine 158PROM  Shoulder extension       Shoulder abduction     112 active supine  Shoulder adduction       Shoulder internal rotation  Can sit on body comfortably   L gute  65/L iliac crest but can get to L4 with some compensation  Shoulder external rotation  20 26  50/T3  Elbow flexion       Elbow extension       Wrist flexion       Wrist extension       Wrist ulnar deviation       Wrist radial deviation       Wrist pronation       Wrist supination       (Blank rows = not tested)  UPPER EXTREMITY MMT:  MMT Right 3/25 Left 3/25 Lt 4/22   Shoulder flexion 15.4 8.2 9.5  Shoulder extension     Shoulder abduction 9.9 6.8 7.3  Shoulder adduction     Shoulder internal rotation 11.3 8.8 6.4  Shoulder external rotation 11.5 8.1 7.8  (Blank rows = not tested)    8/21- did not take strength tests for LEs due to high levels of pain   PALPATION:  No unexpected TTP    LUMBAR SCREEN 7/3:  Negative change in standing extension, increased pain in flexion  Negative for change in prone on elbows static or dynamic  Negative for change with lumbar mobilizations  Negative SLR passive, positive for concordant pain in active SLR  Tightness in hamstrings & lateral hip  Negative concordant pain in resisted hamstring MMT or hip ext MMT  Negative concordant pain in sidelying hip abd (but is weak), proximal HS pain with sidelying hip abd+hip flexion  Negative cocordant pain with supine march bent knee, positive concordant pain active SLR on Rt (no change with movement of left leg)  Decreased concordant pain for a short amount of time following IASTM to hamstrings & STM to glut med/min                                                                                                                            TREATMENT DATE:  9/11 Forward hip hinge stretch to legnthen deep hip ERs on Rt.  Qped ab set with iso HS curl feet on wall Supine 90/90 at wall HS press without glut Bilateral Obers to extension but does not move to adduction, passive SLR Lt to 90 stretchy/Rt to 80 pelvis begins to move Prone glut set- Rt leg bent to isolate glutes Contract relax with pressure to release hip ERs  9/4 STM Rt internal obliques & use of 1/2 foam roll for self massage STM bilat glutes- no innominate rotated found today, good pelvic spring in rotation bilaterally.   8/21 Discussion regarding anatomy of condition and findings of imaging to modify HEP and discuss pacing activities, intensity of activities, safe movements to reduce symptom  exacerbation Walking vs cycling discussion for lower impact exercise Core activation progression paired with breathing- legs over bolster, feet on table, quadruped; all for neutral core engagement and natural spinal- pelvic posture    PATIENT EDUCATION: Education details:  HEP, exercise form, rationale of interventions, protocol restrictions and limitations, protocol ranges, and ROM findings Person educated: Patient Education method: Explanation, Demonstration, Tactile cues, Verbal cues Education comprehension: verbalized understanding, returned demonstration, verbal cues required, tactile cues required, and needs further education  HOME EXERCISE PROGRAM: Access Code: [email or web address] URL: [email or web address]   ASSESSMENT:  CLINICAL IMPRESSION: Tightness noted in deep hip, right-sided, external rotators resulting in compensatory patterns and lack of posterior translation of femoral head when flexing. Able to centralize burning sensation to this muscle group with proper stretching and cues to decrease posterior pelvic tilt.     OBJECTIVE IMPAIRMENTS:  decreased ROM, decreased strength, impaired UE functional use, and pain.   ACTIVITY LIMITATIONS: carrying, lifting, sleeping, bathing, toileting, dressing, self feeding, reach over head, and hygiene/grooming  PARTICIPATION LIMITATIONS: meal prep, cleaning, laundry, driving, shopping, community activity, occupation, and yard work  PERSONAL FACTORS: 1-2 comorbidities: previous replacement on the same shoulder osteoporosis; multi joint OA  are also affecting patient's functional outcome.   REHAB POTENTIAL: Good  CLINICAL DECISION MAKING: Stable/uncomplicated  EVALUATION COMPLEXITY: Low  GOALS: Goals reviewed with patient? Yes  SHORT TERM GOALS: Target date: 04/16/2023    Patient will increase passive flexion to 115 degrees  Baseline: Goal status: MET  2.  Patient will increase left shoulder  ER to 30 degrees  Baseline:  Goal status: MET  3.  Patient will use her left arm for ADL's below 90 degrees with > 2/10 pain  Baseline:  Goal status: MET   LONG TERM GOALS: Target date: POC DATE    The patient will reach overhead with left arm without pain in order to perform ADL's  Baseline:  Goal status: MET  2.  Patient will each behind her head without pain in order to per from her ADL's  Baseline:  Goal status: MET  3.  Patient will reach behind her back to L3 without pain in order to perform ADL's  Baseline:  Goal status: MET  4. Able to demo overhead activity without scapular winging bilaterally  Status: MET 5. Strength available for ADLs with weight/resistance such as washing dishes, vacuuming   Status: ongoing- has met for shoulder but limited by hip/back/buttock pain 6. Feel prepared to lift grand children when I have them.   Status: New 7. Able to sit for at least 10 minutes without needing to stand due to pain  Status: New 8. Demonstrate proper core activation in exercises for lumbopelvic support  Status: New   PLAN: PT FREQUENCY: 2x/week  PT DURATION: 8 weeks  PLANNED INTERVENTIONS: 97110-Therapeutic exercises, 97530- Therapeutic activity, [id number]- Neuromuscular re-education, 97535- Self Care, 02859- Manual therapy, 7113- Aquatic Therapy, 97014- Electrical stimulation (unattended), 97035- Ultrasound, Patient/Family education, Taping, Dry Needling, DME instructions, Cryotherapy, and Moist heat   PLAN FOR NEXT SESSION:    Review core contraction.    Quenna Doepke C. Loghan Kurtzman PT, DPT 11/01/23 1:44 PM

## 2023-11-05 ENCOUNTER — Telehealth: Payer: Self-pay | Admitting: "Endocrinology

## 2023-11-05 NOTE — Telephone Encounter (Signed)
 Pts husband is having surgery on 01/06/24, she would like to come sooner but stated she thinks insurance will only cover at exactly 6 months and 1 day.  She wanted to see if you could check on this and if she can come sooner the week before and insurance still cover it.  She said [facility] always just ordered it, she said she is fine with you ordering from a [facility] as long as they can just ship it here.  If it can be sooner let me know so I can move her appt.

## 2023-11-07 ENCOUNTER — Ambulatory Visit (HOSPITAL_BASED_OUTPATIENT_CLINIC_OR_DEPARTMENT_OTHER): Admitting: Physical Therapy

## 2023-11-07 ENCOUNTER — Encounter (HOSPITAL_BASED_OUTPATIENT_CLINIC_OR_DEPARTMENT_OTHER): Payer: Self-pay | Admitting: Physical Therapy

## 2023-11-07 DIAGNOSIS — M25551 Pain in right hip: Secondary | ICD-10-CM

## 2023-11-07 DIAGNOSIS — M25612 Stiffness of left shoulder, not elsewhere classified: Secondary | ICD-10-CM | POA: Diagnosis not present

## 2023-11-07 DIAGNOSIS — M25512 Pain in left shoulder: Secondary | ICD-10-CM

## 2023-11-07 NOTE — Therapy (Signed)
 OUTPATIENT PHYSICAL THERAPY UPPER EXTREMITY TREATMENT   Patient Name: Tara [name] MRN: [id number] DOB:03/11/1960, 63 y.o., female Today's Date: 11/07/2023  END OF SESSION:  PT End of Session - 11/07/23 1520     Visit Number 38    Number of Visits 39    Date for Recertification  11/23/23    PT Start Time 1517    PT Stop Time 1600    PT Time Calculation (min) 43 min    Activity Tolerance Patient tolerated treatment well    Behavior During Therapy [facility] for tasks assessed/performed                         Past Medical History:  Diagnosis Date   Allergy    Anemia    Arthritis    Hyperlipidemia    Insomnia    Mitral valve prolapse    PONV (postoperative nausea and vomiting)    Past Surgical History:  Procedure Laterality Date   AUGMENTATION MAMMAPLASTY Bilateral    Dec 2019   BREAST BIOPSY Right    axillary node benign   EYE SURGERY  2010   Lasik for distance correction; use one contact in left eye for reading now   JOINT REPLACEMENT  2021; 2022; 2025   reverse shoulder on the right; anatomic replacement on the left then conversion to reverse replacement due to failed anatomic   KNEE SURGERY  1988   right   SHOULDER ARTHROSCOPY WITH LABRAL REPAIR Right 08/28/2012   Procedure: RIGHT SHOULDER ARTHROSCOPY WITH LABRAL DEBRIDEMENT, ASPIRATION AND DECOMPRESSION OF PARALABRAL CYST;  Surgeon: Franky CHRISTELLA Pointer, MD;  Location: [facility];  Service: Orthopedics;  Laterality: Right;   TOTAL SHOULDER ARTHROPLASTY Right 03/24/2020   Procedure: RIGHT REVERSE SHOULDER ARTHROPLASTY;  Surgeon: Pointer Franky, MD;  Location: [facility];  Service: Orthopedics;  Laterality: Right;    TOTAL SHOULDER ARTHROPLASTY Left 02/23/2021   Procedure: TOTAL SHOULDER ARTHROPLASTY;  Surgeon: Pointer Franky, MD;  Location: [facility];  Service: Orthopedics;  Laterality: Left;   TOTAL SHOULDER REVISION Left 02/07/2023   Procedure: Excision Left shoulder nodule; Conversion of left anatomic  shoulder arthroplasty to reverse shoulder arthroplasty;  Surgeon: Pointer Franky, MD;  Location: [facility];  Service: Orthopedics;  Laterality: Left;   Patient Active Problem List   Diagnosis Date Noted   B12 deficiency 09/03/2023   Onychomycosis 05/10/2023   Hypercalciuria 02/02/2023   DDD (degenerative disc disease), cervical 04/13/2021   Osteoporosis without current pathological fracture 10/28/2020   Nonallopathic lesion of sacral region 02/26/2020   Hot flashes 02/23/2019   Other insomnia 02/23/2019   Cystic acne vulgaris 02/23/2019   Bunion of right foot 02/23/2019   Varicose 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 02/23/2019   Multiple atypical nevi 02/23/2019   Mitral valve prolapse 03/03/2009    PCP: Roxan [name]   REFERRING PROVIDER: JONETTA Franky Supple (shoulder) Arthea Sharps, DO (hip)  REFERRING DIAG:  Diagnosis  [phone] (ICD-10-CM) - Presence of left artificial shoulder joint  Pain right hip  THERAPY DIAG:  Stiffness of left shoulder, not elsewhere classified  Acute pain of left shoulder  Pain in right hip  Rationale for Evaluation and Treatment: Rehabilitation  ONSET DATE:  DOS: 02/07/2023  SUBJECTIVE:  SUBJECTIVE STATEMENT: I get numbness in my right foot when I press my heel down when I get too aggravated. After last session it seemed like pain centralized to Rt hip and lower back.  From Initial Evaluation: Patient had a left standard total shoulder replacement last year.  She did well for about 6 months then began having progressive pain in the shoulder.  She had to have a shoulder revision with a reverse total shoulder.  At this time her pain is well-controlled.  She is also had a right shoulder replaced.  When she had that replaced she had significant pain.  She reports  this 1 is better.  She is wearing her sling outside the house and when sleeping.  She is otherwise has been wearing her sling.  She has increased pain when she moves her arm.  She has been compliant with her HEP so far. Hand dominance: Right  PERTINENT HISTORY: Multijoint arthritis including knees and hips.  Osteoporosis, right shoulder replacement, mitral valve prolapse  PAIN:  Are you having pain? Yes: NPRS scale: 0/10  Pain location: hips to neck Pain description: sore Aggravating factors: Use of the arm Relieving factors: Not using the arm is much  PRECAUTIONS: Shoulder  RED FLAGS: None   WEIGHT BEARING RESTRICTIONS: No  FALLS:  Has patient fallen in last 6 months? No  LIVING ENVIRONMENT:   OCCUPATION: Nurse   PLOF: Independent  PATIENT GOALS:  Patient wants to be functional   OBJECTIVE:  Note: Objective measures were completed at Evaluation unless otherwise noted. IMAGING:  MRI Rt hip 8/21 IMPRESSION: 1. Mild right hip osteoarthritis. 2. Bilateral hamstring and gluteal tendinosis. 3. Mild peritrochanteric bursitis bilaterally. 4. Small volume free fluid within the pelvis, nonspecific.  MRI lumbar 8/12 IMPRESSION: 1. Mild canal stenosis at L3-L4. 2. Moderate left and mild right foraminal stenosis at L5-S1 secondary to disc bulging and facet arthropathy.   PATIENT SURVEYS :  UEFI 40/80   UPPER EXTREMITY ROM:   Passive ROM Right eval Left eval Left 1/21 L  L  Shoulder flexion  85 106 130AROM 160PROM  148AROM supine 158PROM  Shoulder extension       Shoulder abduction     112 active supine  Shoulder adduction       Shoulder internal rotation  Can sit on body comfortably   L gute  65/L iliac crest but can get to L4 with some compensation  Shoulder external rotation  20 26  50/T3  Elbow flexion       Elbow extension       Wrist flexion       Wrist extension       Wrist ulnar deviation       Wrist radial deviation       Wrist pronation        Wrist supination       (Blank rows = not tested)  UPPER EXTREMITY MMT:  MMT Right 3/25 Left 3/25 Lt 4/22  Shoulder flexion 15.4 8.2 9.5  Shoulder extension     Shoulder abduction 9.9 6.8 7.3  Shoulder adduction     Shoulder internal rotation 11.3 8.8 6.4  Shoulder external rotation 11.5 8.1 7.8  (Blank rows = not tested)    8/21- did not take strength tests for LEs due to high levels of pain   PALPATION:  No unexpected TTP    LUMBAR SCREEN 7/3:  Negative change in standing extension, increased pain in flexion  Negative for change in prone on  elbows static or dynamic  Negative for change with lumbar mobilizations  Negative SLR passive, positive for concordant pain in active SLR  Tightness in hamstrings & lateral hip  Negative concordant pain in resisted hamstring MMT or hip ext MMT  Negative concordant pain in sidelying hip abd (but is weak), proximal HS pain with sidelying hip abd+hip flexion  Negative cocordant pain with supine march bent knee, positive concordant pain active SLR on Rt (no change with movement of left leg)  Decreased concordant pain for a short amount of time following IASTM to hamstrings & STM to glut med/min                                                                                                                            TREATMENT DATE:  9/18 Standing forward hinge- pressing Rt hip back Standing lateral shifts via glut press & adductor pull Ragdoll stretch with breathing- tuberosities against wall for equal pelvis posture.  Seated adductor pull/pelvis twisting- added ball bw knees and tactile cues to  IASTM bilateral anterior shoulders  9/11 Forward hip hinge stretch to legnthen deep hip ERs on Rt.  Qped ab set with iso HS curl feet on wall Supine 90/90 at wall HS press without glut Bilateral Obers to extension but does not move to adduction, passive SLR Lt to 90 stretchy/Rt to 80 pelvis begins to move Prone glut set- Rt leg bent to  isolate glutes Contract relax with pressure to release hip ERs  9/4 STM Rt internal obliques & use of 1/2 foam roll for self massage STM bilat glutes- no innominate rotated found today, good pelvic spring in rotation bilaterally.   8/21 Discussion regarding anatomy of condition and findings of imaging to modify HEP and discuss pacing activities, intensity of activities, safe movements to reduce symptom exacerbation Walking vs cycling discussion for lower impact exercise Core activation progression paired with breathing- legs over bolster, feet on table, quadruped; all for neutral core engagement and natural spinal- pelvic posture    PATIENT EDUCATION: Education details:  HEP, exercise form, rationale of interventions, protocol restrictions and limitations, protocol ranges, and ROM findings Person educated: Patient Education method: Explanation, Demonstration, Tactile cues, Verbal cues Education comprehension: verbalized understanding, returned demonstration, verbal cues required, tactile cues required, and needs further education  HOME EXERCISE PROGRAM: Access Code: [email or web address] URL: [email or web address]   ASSESSMENT:  CLINICAL IMPRESSION: Continued tightness in Rt deep hip external rotators and along Rt side of spine due to compensatory sidebend when flexing. Significant difficulty noted when trying to engage adductors- overuse of hip flexors.     OBJECTIVE IMPAIRMENTS:  decreased ROM, decreased strength, impaired UE functional use, and pain.   ACTIVITY LIMITATIONS: carrying, lifting, sleeping, bathing, toileting, dressing, self feeding, reach over head, and hygiene/grooming  PARTICIPATION LIMITATIONS: meal prep, cleaning, laundry, driving, shopping, community activity, occupation, and yard work  PERSONAL FACTORS: 1-2 comorbidities: previous replacement on the same shoulder osteoporosis; multi joint OA  are also affecting patient's  functional outcome.   REHAB  POTENTIAL: Good  CLINICAL DECISION MAKING: Stable/uncomplicated  EVALUATION COMPLEXITY: Low  GOALS: Goals reviewed with patient? Yes  SHORT TERM GOALS: Target date: 04/16/2023    Patient will increase passive flexion to 115 degrees  Baseline: Goal status: MET  2.  Patient will increase left shoulder  ER to 30 degrees  Baseline:  Goal status: MET  3.  Patient will use her left arm for ADL's below 90 degrees with > 2/10 pain  Baseline:  Goal status: MET   LONG TERM GOALS: Target date: POC DATE    The patient will reach overhead with left arm without pain in order to perform ADL's  Baseline:  Goal status: MET  2.  Patient will each behind her head without pain in order to per from her ADL's  Baseline:  Goal status: MET  3.  Patient will reach behind her back to L3 without pain in order to perform ADL's  Baseline:  Goal status: MET  4. Able to demo overhead activity without scapular winging bilaterally  Status: MET 5. Strength available for ADLs with weight/resistance such as washing dishes, vacuuming   Status: ongoing- has met for shoulder but limited by hip/back/buttock pain 6. Feel prepared to lift grand children when I have them.   Status: New 7. Able to sit for at least 10 minutes without needing to stand due to pain  Status: New 8. Demonstrate proper core activation in exercises for lumbopelvic support  Status: New   PLAN: PT FREQUENCY: 2x/week  PT DURATION: 8 weeks  PLANNED INTERVENTIONS: 97110-Therapeutic exercises, 97530- Therapeutic activity, [id number]- Neuromuscular re-education, 97535- Self Care, [id number]- Manual therapy, 7113- Aquatic Therapy, 97014- Electrical stimulation (unattended), 97035- Ultrasound, Patient/Family education, Taping, Dry Needling, DME instructions, Cryotherapy, and Moist heat   PLAN FOR NEXT SESSION:    Review core contraction.    Coralyn Roselli C. Lenford Beddow PT, DPT 11/07/23 5:12 PM

## 2023-11-08 NOTE — Telephone Encounter (Signed)
 Left a message requesting pt return call to the office.

## 2023-11-11 ENCOUNTER — Other Ambulatory Visit (HOSPITAL_COMMUNITY): Payer: Self-pay

## 2023-11-11 MED ORDER — FLUZONE 0.5 ML IM SUSY
0.5000 mL | PREFILLED_SYRINGE | Freq: Once | INTRAMUSCULAR | 0 refills | Status: AC
  Filled 2023-11-11: qty 0.5, 1d supply, fill #0

## 2023-11-14 ENCOUNTER — Encounter (HOSPITAL_BASED_OUTPATIENT_CLINIC_OR_DEPARTMENT_OTHER): Payer: Self-pay | Admitting: Physical Therapy

## 2023-11-14 ENCOUNTER — Ambulatory Visit (HOSPITAL_BASED_OUTPATIENT_CLINIC_OR_DEPARTMENT_OTHER): Admitting: Physical Therapy

## 2023-11-14 DIAGNOSIS — M25612 Stiffness of left shoulder, not elsewhere classified: Secondary | ICD-10-CM | POA: Diagnosis not present

## 2023-11-14 DIAGNOSIS — M25551 Pain in right hip: Secondary | ICD-10-CM | POA: Diagnosis not present

## 2023-11-14 DIAGNOSIS — M25512 Pain in left shoulder: Secondary | ICD-10-CM | POA: Diagnosis not present

## 2023-11-14 NOTE — Therapy (Signed)
 OUTPATIENT PHYSICAL THERAPY UPPER EXTREMITY TREATMENT   Patient Name: Tara [name] MRN: [id number] DOB:[birth year], 63 y.o., female Today's Date: 11/14/2023  END OF SESSION:  PT End of Session - 11/14/23 1514     Visit Number 39    Number of Visits 51    Date for Recertification  02/06/24    PT Start Time 1515    PT Stop Time 1556    PT Time Calculation (min) 41 min    Activity Tolerance Patient tolerated treatment well    Behavior During Therapy [facility] for tasks assessed/performed                         Past Medical History:  Diagnosis Date   Allergy    Anemia    Arthritis    Hyperlipidemia    Insomnia    Mitral valve prolapse    PONV (postoperative nausea and vomiting)    Past Surgical History:  Procedure Laterality Date   AUGMENTATION MAMMAPLASTY Bilateral    Dec 2019   BREAST BIOPSY Right    axillary node benign   EYE SURGERY  2010   Lasik for distance correction; use one contact in left eye for reading now   JOINT REPLACEMENT  2021; 2022; 2025   reverse shoulder on the right; anatomic replacement on the left then conversion to reverse replacement due to failed anatomic   KNEE SURGERY  1988   right   SHOULDER ARTHROSCOPY WITH LABRAL REPAIR Right 08/28/2012   Procedure: RIGHT SHOULDER ARTHROSCOPY WITH LABRAL DEBRIDEMENT, ASPIRATION AND DECOMPRESSION OF PARALABRAL CYST;  Surgeon: Franky CHRISTELLA Pointer, MD;  Location: [facility];  Service: Orthopedics;  Laterality: Right;   TOTAL SHOULDER ARTHROPLASTY Right 03/24/2020   Procedure: RIGHT REVERSE SHOULDER ARTHROPLASTY;  Surgeon: Pointer Franky, MD;  Location: [facility];  Service: Orthopedics;  Laterality: Right;    TOTAL SHOULDER ARTHROPLASTY Left 02/23/2021   Procedure: TOTAL SHOULDER ARTHROPLASTY;  Surgeon: Pointer Franky, MD;  Location: [facility];  Service: Orthopedics;  Laterality: Left;   TOTAL SHOULDER REVISION Left 02/07/2023   Procedure: Excision Left shoulder nodule; Conversion of left anatomic  shoulder arthroplasty to reverse shoulder arthroplasty;  Surgeon: Pointer Franky, MD;  Location: [facility];  Service: Orthopedics;  Laterality: Left;   Patient Active Problem List   Diagnosis Date Noted   B12 deficiency 09/03/2023   Onychomycosis 05/10/2023   Hypercalciuria 02/02/2023   DDD (degenerative disc disease), cervical 04/13/2021   Osteoporosis without current pathological fracture 10/28/2020   Nonallopathic lesion of sacral region 02/26/2020   Hot flashes 02/23/2019   Other insomnia 02/23/2019   Cystic acne vulgaris 02/23/2019   Bunion of right foot 02/23/2019   Varicose 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 02/23/2019   Multiple atypical nevi 02/23/2019   Mitral valve prolapse 03/03/2009    PCP: Roxan [name]   REFERRING PROVIDER: JONETTA Franky Supple (shoulder) Arthea Sharps, DO (hip)  REFERRING DIAG:  Diagnosis  [phone] (ICD-10-CM) - Presence of left artificial shoulder joint  Pain right hip  THERAPY DIAG:  Stiffness of left shoulder, not elsewhere classified  Acute pain of left shoulder  Pain in right hip  Rationale for Evaluation and Treatment: Rehabilitation  ONSET DATE:  DOS: 02/07/2023  SUBJECTIVE:  SUBJECTIVE STATEMENT: I have been working really hard- I am not any worse. Still need some work on what to do in the car.   From Initial Evaluation: Patient had a left standard total shoulder replacement last year.  She did well for about 6 months then began having progressive pain in the shoulder.  She had to have a shoulder revision with a reverse total shoulder.  At this time her pain is well-controlled.  She is also had a right shoulder replaced.  When she had that replaced she had significant pain.  She reports this 1 is better.  She is wearing her sling outside the house  and when sleeping.  She is otherwise has been wearing her sling.  She has increased pain when she moves her arm.  She has been compliant with her HEP so far. Hand dominance: Right  PERTINENT HISTORY: Multijoint arthritis including knees and hips.  Osteoporosis, right shoulder replacement, mitral valve prolapse  PAIN:  Are you having pain? Yes: NPRS scale: 0/10  Pain location: hips to neck Pain description: sore Aggravating factors: Use of the arm Relieving factors: Not using the arm is much  PRECAUTIONS: Shoulder  RED FLAGS: None   WEIGHT BEARING RESTRICTIONS: No  FALLS:  Has patient fallen in last 6 months? No  LIVING ENVIRONMENT:   OCCUPATION: Nurse   PLOF: Independent  PATIENT GOALS:  Patient wants to be functional   OBJECTIVE:  Note: Objective measures were completed at Evaluation unless otherwise noted. IMAGING:  MRI Rt hip 8/21 IMPRESSION: 1. Mild right hip osteoarthritis. 2. Bilateral hamstring and gluteal tendinosis. 3. Mild peritrochanteric bursitis bilaterally. 4. Small volume free fluid within the pelvis, nonspecific.  MRI lumbar 8/12 IMPRESSION: 1. Mild canal stenosis at L3-L4. 2. Moderate left and mild right foraminal stenosis at L5-S1 secondary to disc bulging and facet arthropathy.   PATIENT SURVEYS :  UEFI 40/80   UPPER EXTREMITY ROM:   Passive ROM Right eval Left eval Left 1/21 L  L  Shoulder flexion  85 106 130AROM 160PROM  148AROM supine 158PROM  Shoulder extension       Shoulder abduction     112 active supine  Shoulder adduction       Shoulder internal rotation  Can sit on body comfortably   L gute  65/L iliac crest but can get to L4 with some compensation  Shoulder external rotation  20 26  50/T3  Elbow flexion       Elbow extension       Wrist flexion       Wrist extension       Wrist ulnar deviation       Wrist radial deviation       Wrist pronation       Wrist supination       (Blank rows = not tested)  UPPER  EXTREMITY MMT:  MMT Right 3/25 Left 3/25 Lt 4/22 Rt/Lt 9/25  Shoulder flexion 15.4 8.2 9.5 24.4/16.6  Shoulder extension      Shoulder abduction 9.9 6.8 7.3 20.6/16.7  Shoulder adduction      Shoulder internal rotation 11.3 8.8 6.4 15.4/11.9  Shoulder external rotation 11.5 8.1 7.8 15.3/12.5  (Blank rows = not tested)    8/21- did not take strength tests for LEs due to high levels of pain  MMT Right 9/25 Left 9/25  Hip flexion 31.8 29.6  Hip extension    Hip abduction 36.5 36.9  Hip adduction    Hip internal rotation  Hip external rotation    Knee flexion 25.5 26.9  Knee extension 48.1 47.1   (Blank rows = not tested)    PALPATION:  No unexpected TTP    LUMBAR SCREEN 7/3:  Negative change in standing extension, increased pain in flexion  Negative for change in prone on elbows static or dynamic  Negative for change with lumbar mobilizations  Negative SLR passive, positive for concordant pain in active SLR  Tightness in hamstrings & lateral hip  Negative concordant pain in resisted hamstring MMT or hip ext MMT  Negative concordant pain in sidelying hip abd (but is weak), proximal HS pain with sidelying hip abd+hip flexion  Negative cocordant pain with supine march bent knee, positive concordant pain active SLR on Rt (no change with movement of left leg)  Decreased concordant pain for a short amount of time following IASTM to hamstrings & STM to glut med/min                                                                                                                            TREATMENT DATE:  9/25 Lunge with hinge and adductor activation Standing hip hinge with Rt hip press back Seated activation in all motions for car Vacuuming form/pressing hip back    9/18 Standing forward hinge- pressing Rt hip back Standing lateral shifts via glut press & adductor pull Ragdoll stretch with breathing- tuberosities against wall for equal pelvis posture.  Seated adductor  pull/pelvis twisting- added ball bw knees and tactile cues to  IASTM bilateral anterior shoulders  9/11 Forward hip hinge stretch to legnthen deep hip ERs on Rt.  Qped ab set with iso HS curl feet on wall Supine 90/90 at wall HS press without glut Bilateral Obers to extension but does not move to adduction, passive SLR Lt to 90 stretchy/Rt to 80 pelvis begins to move Prone glut set- Rt leg bent to isolate glutes Contract relax with pressure to release hip ERs    PATIENT EDUCATION: Education details:  HEP, exercise form, rationale of interventions, protocol restrictions and limitations, protocol ranges, and ROM findings Person educated: Patient Education method: Explanation, Demonstration, Tactile cues, Verbal cues Education comprehension: verbalized understanding, returned demonstration, verbal cues required, tactile cues required, and needs further education  HOME EXERCISE PROGRAM: Access Code: [email or web address] URL: [email or web address]   ASSESSMENT:  CLINICAL IMPRESSION: Significant improvement in press back through Rt hip and engagement of adductors without use of hip flexors. Still limited in flexibility for posterior translation of Rt femur vs Lt in seated. Worked on car mobility as she is still limited in that position but has been able to increase time of tolerance in seated.     OBJECTIVE IMPAIRMENTS:  decreased ROM, decreased strength, impaired UE functional use, and pain.   ACTIVITY LIMITATIONS: carrying, lifting, sleeping, bathing, toileting, dressing, self feeding, reach over head, and hygiene/grooming  PARTICIPATION LIMITATIONS: meal prep, cleaning, laundry, driving, shopping, community activity, occupation, and yard work  PERSONAL FACTORS: 1-2  comorbidities: previous replacement on the same shoulder osteoporosis; multi joint OA  are also affecting patient's functional outcome.   REHAB POTENTIAL: Good  CLINICAL DECISION MAKING:  Stable/uncomplicated  EVALUATION COMPLEXITY: Low  GOALS: Goals reviewed with patient? Yes  SHORT TERM GOALS: Target date: 04/16/2023    Patient will increase passive flexion to 115 degrees  Baseline: Goal status: MET  2.  Patient will increase left shoulder  ER to 30 degrees  Baseline:  Goal status: MET  3.  Patient will use her left arm for ADL's below 90 degrees with > 2/10 pain  Baseline:  Goal status: MET   LONG TERM GOALS: Target date: POC DATE    The patient will reach overhead with left arm without pain in order to perform ADL's  Baseline:  Goal status: MET  2.  Patient will each behind her head without pain in order to per from her ADL's  Baseline:  Goal status: MET  3.  Patient will reach behind her back to L3 without pain in order to perform ADL's  Baseline:  Goal status: MET  4. Able to demo overhead activity without scapular winging bilaterally  Status: MET 5. Strength available for ADLs with weight/resistance such as washing dishes, vacuuming   Status: ongoing- has met for shoulder but limited by hip/back/buttock pain 6. Feel prepared to lift grand children when I have them.   Status: New 7. Able to sit for at least 10 minutes without needing to stand due to pain  Baseline: activated it hurts immediately, but if I am not I may be able to make it 5 minutes now!  Status: ongoing 8. Demonstrate proper core activation in exercises for lumbopelvic support  Status: New   PLAN: PT FREQUENCY: 2x/week  PT DURATION: 8 weeks  PLANNED INTERVENTIONS: 97110-Therapeutic exercises, 97530- Therapeutic activity, [id number]- Neuromuscular re-education, 97535- Self Care, 02859- Manual therapy, 7113- Aquatic Therapy, 97014- Electrical stimulation (unattended), 97035- Ultrasound, Patient/Family education, Taping, Dry Needling, DME instructions, Cryotherapy, and Moist heat   PLAN FOR NEXT SESSION:    Review core contraction.    Lakeishia Truluck C. Ankith Edmonston PT, DPT 11/14/23  5:03 PM

## 2023-11-25 ENCOUNTER — Encounter: Attending: Internal Medicine | Admitting: Dietician

## 2023-11-25 ENCOUNTER — Encounter: Payer: Self-pay | Admitting: Dietician

## 2023-11-25 DIAGNOSIS — E785 Hyperlipidemia, unspecified: Secondary | ICD-10-CM | POA: Insufficient documentation

## 2023-11-25 NOTE — Progress Notes (Unsigned)
 Medical Nutrition Therapy  Appointment Start time:  1630  Appointment End time:  1715 Patient is here today alone.  She was last seen by this RD 10/11/2023  11/25/2023 States that her joints feel better since losing to her preferred weight.  She was not weighed today. Home weight was 135 lbs. She is concerned about her metabolism decreasing and that she will gain weight.  She states that she finally feels comfortable where she is at.  Gained weight previously as she ate all day long. Invisaline has improved this. She is not hungry but has increased food cue habits. She states that she does many things that bring her joy such as church, volunteering, family, exercise and does not feel that she eats out of emotion but just that she likes to eat.    She has tracked her intake for sodium, protein, calcium  and others about once every 1-2 weeks.  She is concerned that she is not meeting her protein needs then maxes her protein at night (50 grams at a time) despite knowing she is unlikely to absorb this portion.  She likely is undercounting her protein based on the portions and foods that she describes.  Have asked that she text me pictures of her meals for 24 hours for me to get a better idea of her intake. She has had swelling in the lower legs.  Concerned about this and discussed needs for 85-95 grams protein per day. Discussed her fasting.  She finds fasting for 12 hours (12 hour eating window) very difficult as she gets hungry.  Discussed to error on the side of nutrition rather than fasting.    She has recorded a lot of questions and concerns via MyChart message which we have discussed during this visit.  Much of this is related to nutrient absorption, timing of supplements.  How to get the best nutrition from the food that she eats.   Discussed impact of tea on absorption of iron as well as coffee decreases absorption of nutrients as well as caffeine.  09/2023 She wishes to maintain her weight  due to issues in her knees. She is eating well with increased variety and is meeting her protein, carbohydrate, and vitamin needs overall.  She is aiming for 25 grams of protein per meal.   She continues to have problems with sleep and has further questions on this.  She falls asleep early (about 8 am) but wakes after 2 hours and it is difficult to get adequate sleep.  She is getting only 2-3 hours sleep per night most nights - significant issues with sleep duration.  She has stopped the Melatonin, gaba and l-threonine supplement.  She continues the magnesium glycinate and magnesium min but this does not seem to have helped.  She gets up and reads or drinks herbal tea when she wakes.   She has further questions about food. She is trying not to snack as much.  06/2023 She states that she has learned from the Full Plate Living program information from ACLM provided at her last visit and has participated in the online programs and participated in the LYFT project which is a series on overall well being and mental health. She states that she is eating similarly and states that she is feeling quite food obsessed and finishes each meal with a PB powder.  She is exercising for bone health and strength but also due to balancing overeating PB powder. Weight is stable.  Patient states that she feels best <  140 lbs.  She states that she is unable to stop eating the PB powder at this time. She continues to have problems waking in the middle of the night and not being able to return to sleep.  She is taking magnesium and other supplements including melatonin but this continues.  Her sleep hygiene is very good.  Exercises in the am. Continues to have problems with menopause  Primary concerns today: wants to know how to not have to work so hard to keep weight undercontrol, hypercalciuria,  tips to get her body in the best shape for shoulder surgery. She discussed that since menopause, she gets nauseous when she gets  hot flashes and eating helps prevent this.  She states that she may eat at times to prevent this rather than hunger.  She states that she feels like she is exercising too much to maintain her weight and lacks time to do other things she enjoys. Referral diagnosis: [employer] Employee - no referral required Preferred learning style: no preference indicated Learning readiness: ready, change in progress)  NUTRITION ASSESSMENT  Anthropometrics 70 139 lbs 10/11/2023.  Feels this is her goal weight.  She states that maintaining this weight is very difficult.  She states that she physically feels best at this weight (spine, shoulders, knees). 143 lbs 07/12/2023 144 lbs 11/23/2022 156 lbs 2015 Feels best (joints) when her weight is <140 lbs  Clinical Medical Hx: osteoarthritis, osteoporosis, hypercalciuria, HLD Medications: see list to include crestor  Labs: Urine Calcium  334 on 11/2021, Vitamin D  32 on 10/25023, Vitamin B-12 744 on 12/25/2021 Notable Signs/Symptoms: Joints hurt and body hurts when she gets in the higher 140 range.  Lifestyle & Dietary Hx She is a [employer].  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  Patient lives with her husband.  Daily fluid intake: adequate Supplements: psyillium in unsweetened almond milk, MVI, Vitamin D3, Omega 3, Calcium , mindful cocoa (contains melatonin) Sleep: 4 hours broken  Stress / self-care: high stress, good self care Current average weekly physical activity: M, W, F - free weights for 30 minutes or more and 15 minutes of pilaties, restorative yoga on Fridays, running Tuesday and Thursday 3-4 miles and Saturday 6 miles. She has now increased her running 2 miles per day that she runs and light weights on M, W, F.  24-Hr Dietary Recall No dairy, no animal protein (Dr. suggested avoiding added salt, dairy and animal protein due to hypercalciuria.)  Will eat bread that she makes of Ezekiel  bread. First Meal: salad with kale, spinach, chick peas, celery, mushrooms, tomatoes, kimchi, watermelon, 1 cup ripple milk with 1/2 protein powder, [place] crackers, avocado OR PB powder (reconstituted with cocoa powder and water ),high fiber wrap with avocado, spinach, apple, broccoli, carrots, hummus Snack: none Second Meal: broccoli slaw, cranberries, pumpkin seeds, mustard vinegar and honey dressing, wasa, avocado, banana, 4 T PB reconstituted PB2 with cocoa or chocolate protein powder, almond milk Snack: Foragers protein yogurt with cocoa powder Third Meal:  [employer] pasta, tomato sauce, black beans, onions, mushrooms, tomatoes, salad Snack: none Beverages: water , Ripple milk mixed with cocoa, almond milk, Bubbly, herbal tea, protein powder  Estimated Energy Needs Calories: 1900-2100 Protein: 85-95g  NUTRITION DIAGNOSIS  NB-1.1 Food and nutrition-related knowledge deficit As related to balance of carbohydrate, protein, and fat.  As evidenced by diet hx and patient report.   NUTRITION INTERVENTION  Nutrition education (E-1) on the following topics:  Adequacy  of intake and absorption of nutrients Fasting  Importance of adequate protein Other concerns above  Handouts Provided Include  [facility] [employer] developer of Lifestyle Medicine) packet (initial visit) ACLM sleep section from board review manual Resources:  Cooking for peanuts, plant you, Vonzell Avena MD - easy black bean burgers, whole food plant based cooking show, Dr. Evelia daily dozen, complement vitamins/protein  Learning Style & Readiness for Change Teaching method utilized: Visual & Auditory  Demonstrated degree of understanding via: Teach Back  Barriers to learning/adherence to lifestyle change: none  Goals Established by Pt Choose Vitamin D3K2 Continue to eat mindfully Consider eating for a while (30 days?) without purposeful restriction, paying attention to how your body feels, and not using judgement regarding food.  How  do you feel?  Where did you land.   Continue a whole foods, plant based diet You are continuing to obtain adequate protein Continue to stay active  MONITORING & EVALUATION Dietary intake, weekly physical activity prn  Next Steps  Patient is to call for questions.

## 2023-11-26 ENCOUNTER — Encounter (HOSPITAL_BASED_OUTPATIENT_CLINIC_OR_DEPARTMENT_OTHER): Payer: Self-pay | Admitting: Physical Therapy

## 2023-11-26 ENCOUNTER — Ambulatory Visit (HOSPITAL_BASED_OUTPATIENT_CLINIC_OR_DEPARTMENT_OTHER): Payer: Self-pay | Attending: Orthopedic Surgery | Admitting: Physical Therapy

## 2023-11-26 DIAGNOSIS — M25612 Stiffness of left shoulder, not elsewhere classified: Secondary | ICD-10-CM | POA: Insufficient documentation

## 2023-11-26 DIAGNOSIS — M25551 Pain in right hip: Secondary | ICD-10-CM | POA: Insufficient documentation

## 2023-11-26 DIAGNOSIS — M25512 Pain in left shoulder: Secondary | ICD-10-CM | POA: Insufficient documentation

## 2023-11-26 NOTE — Therapy (Signed)
 OUTPATIENT PHYSICAL THERAPY UPPER EXTREMITY TREATMENT   Patient Name: Tara [name] MRN: [id number] DOB:[birth year], 63 y.o., female Today's Date: 11/26/2023  END OF SESSION:  PT End of Session - 11/26/23 1059     Visit Number 40    Number of Visits 51    Date for Recertification  02/06/24    PT Start Time 1100    PT Stop Time 1145    PT Time Calculation (min) 45 min    Activity Tolerance Patient tolerated treatment well    Behavior During Therapy [facility] for tasks assessed/performed                         Past Medical History:  Diagnosis Date   Allergy    Anemia    Arthritis    Hyperlipidemia    Insomnia    Mitral valve prolapse    PONV (postoperative nausea and vomiting)    Past Surgical History:  Procedure Laterality Date   AUGMENTATION MAMMAPLASTY Bilateral    Dec 2019   BREAST BIOPSY Right    axillary node benign   EYE SURGERY  2010   Lasik for distance correction; use one contact in left eye for reading now   JOINT REPLACEMENT  2021; 2022; 2025   reverse shoulder on the right; anatomic replacement on the left then conversion to reverse replacement due to failed anatomic   KNEE SURGERY  1988   right   SHOULDER ARTHROSCOPY WITH LABRAL REPAIR Right 08/28/2012   Procedure: RIGHT SHOULDER ARTHROSCOPY WITH LABRAL DEBRIDEMENT, ASPIRATION AND DECOMPRESSION OF PARALABRAL CYST;  Surgeon: Franky CHRISTELLA Pointer, MD;  Location: [facility] OR;  Service: Orthopedics;  Laterality: Right;   TOTAL SHOULDER ARTHROPLASTY Right 03/24/2020   Procedure: RIGHT REVERSE SHOULDER ARTHROPLASTY;  Surgeon: Pointer Franky, MD;  Location: [facility] ORS;  Service: Orthopedics;  Laterality: Right;    TOTAL SHOULDER ARTHROPLASTY Left 02/23/2021   Procedure: TOTAL SHOULDER ARTHROPLASTY;  Surgeon: Pointer Franky, MD;  Location: [facility];  Service: Orthopedics;  Laterality: Left;   TOTAL SHOULDER REVISION Left 02/07/2023   Procedure: Excision Left shoulder nodule; Conversion of left anatomic  shoulder arthroplasty to reverse shoulder arthroplasty;  Surgeon: Pointer Franky, MD;  Location: [facility];  Service: Orthopedics;  Laterality: Left;   Patient Active Problem List   Diagnosis Date Noted   B12 deficiency 09/03/2023   Onychomycosis 05/10/2023   Hypercalciuria 02/02/2023   DDD (degenerative disc disease), cervical 04/13/2021   Osteoporosis without current pathological fracture 10/28/2020   Nonallopathic lesion of sacral region 02/26/2020   Hot flashes 02/23/2019   Other insomnia 02/23/2019   Cystic acne vulgaris 02/23/2019   Bunion of right foot 02/23/2019   Varicose 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 02/23/2019   Multiple atypical nevi 02/23/2019   Mitral valve prolapse 03/03/2009    PCP: Roxan [name]   REFERRING PROVIDER: JONETTA Franky Supple (shoulder) Arthea Sharps, DO (hip)  REFERRING DIAG:  Diagnosis  [phone] (ICD-10-CM) - Presence of left artificial shoulder joint  Pain right hip  THERAPY DIAG:  Stiffness of left shoulder, not elsewhere classified  Acute pain of left shoulder  Pain in right hip  Rationale for Evaluation and Treatment: Rehabilitation  ONSET DATE:  DOS: 02/07/2023  SUBJECTIVE:  SUBJECTIVE STATEMENT: Still need strategies for sitting.   From Initial Evaluation: Patient had a left standard total shoulder replacement last year.  She did well for about 6 months then began having progressive pain in the shoulder.  She had to have a shoulder revision with a reverse total shoulder.  At this time her pain is well-controlled.  She is also had a right shoulder replaced.  When she had that replaced she had significant pain.  She reports this 1 is better.  She is wearing her sling outside the house and when sleeping.  She is otherwise has been wearing her sling.   She has increased pain when she moves her arm.  She has been compliant with her HEP so far. Hand dominance: Right  PERTINENT HISTORY: Multijoint arthritis including knees and hips.  Osteoporosis, right shoulder replacement, mitral valve prolapse  PAIN:  Are you having pain? Yes: NPRS scale: 0/10  Pain location: hips to neck Pain description: sore Aggravating factors: Use of the arm Relieving factors: Not using the arm is much  PRECAUTIONS: Shoulder  RED FLAGS: None   WEIGHT BEARING RESTRICTIONS: No  FALLS:  Has patient fallen in last 6 months? No  LIVING ENVIRONMENT:   OCCUPATION: Nurse   PLOF: Independent  PATIENT GOALS:  Patient wants to be functional   OBJECTIVE:  Note: Objective measures were completed at Evaluation unless otherwise noted. IMAGING:  MRI Rt hip 8/21 IMPRESSION: 1. Mild right hip osteoarthritis. 2. Bilateral hamstring and gluteal tendinosis. 3. Mild peritrochanteric bursitis bilaterally. 4. Small volume free fluid within the pelvis, nonspecific.  MRI lumbar 8/12 IMPRESSION: 1. Mild canal stenosis at L3-L4. 2. Moderate left and mild right foraminal stenosis at L5-S1 secondary to disc bulging and facet arthropathy.   PATIENT SURVEYS :  UEFI 40/80   UPPER EXTREMITY ROM:   Passive ROM Right eval Left eval Left 1/21 L  L  Shoulder flexion  85 106 130AROM 160PROM  148AROM supine 158PROM  Shoulder extension       Shoulder abduction     112 active supine  Shoulder adduction       Shoulder internal rotation  Can sit on body comfortably   L gute  65/L iliac crest but can get to L4 with some compensation  Shoulder external rotation  20 26  50/T3  Elbow flexion       Elbow extension       Wrist flexion       Wrist extension       Wrist ulnar deviation       Wrist radial deviation       Wrist pronation       Wrist supination       (Blank rows = not tested)  UPPER EXTREMITY MMT:  MMT Right 3/25 Left 3/25 Lt 4/22 Rt/Lt 9/25   Shoulder flexion 15.4 8.2 9.5 24.4/16.6  Shoulder extension      Shoulder abduction 9.9 6.8 7.3 20.6/16.7  Shoulder adduction      Shoulder internal rotation 11.3 8.8 6.4 15.4/11.9  Shoulder external rotation 11.5 8.1 7.8 15.3/12.5  (Blank rows = not tested)    8/21- did not take strength tests for LEs due to high levels of pain  MMT Right 9/25 Left 9/25  Hip flexion 31.8 29.6  Hip extension    Hip abduction 36.5 36.9  Hip adduction    Hip internal rotation    Hip external rotation    Knee flexion 25.5 26.9  Knee extension 48.1 47.1   (  Blank rows = not tested)    PALPATION:  No unexpected TTP    LUMBAR SCREEN 7/3:  Negative change in standing extension, increased pain in flexion  Negative for change in prone on elbows static or dynamic  Negative for change with lumbar mobilizations  Negative SLR passive, positive for concordant pain in active SLR  Tightness in hamstrings & lateral hip  Negative concordant pain in resisted hamstring MMT or hip ext MMT  Negative concordant pain in sidelying hip abd (but is weak), proximal HS pain with sidelying hip abd+hip flexion  Negative cocordant pain with supine march bent knee, positive concordant pain active SLR on Rt (no change with movement of left leg)  Decreased concordant pain for a short amount of time following IASTM to hamstrings & STM to glut med/min                                                                                                                            TREATMENT DATE:  10/7  Used bar in group class room for UE cues to hips in lunges, squads, hip flexion Seated isometric hip activation in all motions Seated hip hinge with adductor use to pull femur posteriorly Stnading with foot on bar- use of pelvic tilt for HS stretch Reformer bar & feet in straps press for hip flexion and tailbone down- rolled blue towel to bring ground up Reformer [id number] hands in straps press and circles with LEs at  90/90  9/25 Lunge with hinge and adductor activation Standing hip hinge with Rt hip press back Seated activation in all motions for car Vacuuming form/pressing hip back    9/18 Standing forward hinge- pressing Rt hip back Standing lateral shifts via glut press & adductor pull Ragdoll stretch with breathing- tuberosities against wall for equal pelvis posture.  Seated adductor pull/pelvis twisting- added ball bw knees and tactile cues to  IASTM bilateral anterior shoulders  9/11 Forward hip hinge stretch to legnthen deep hip ERs on Rt.  Qped ab set with iso HS curl feet on wall Supine 90/90 at wall HS press without glut Bilateral Obers to extension but does not move to adduction, passive SLR Lt to 90 stretchy/Rt to 80 pelvis begins to move Prone glut set- Rt leg bent to isolate glutes Contract relax with pressure to release hip ERs    PATIENT EDUCATION: Education details:  HEP, exercise form, rationale of interventions, protocol restrictions and limitations, protocol ranges, and ROM findings Person educated: Patient Education method: Explanation, Demonstration, Tactile cues, Verbal cues Education comprehension: verbalized understanding, returned demonstration, verbal cues required, tactile cues required, and needs further education  HOME EXERCISE PROGRAM: Access Code: [email or web address] URL: [email or web address]   ASSESSMENT:  CLINICAL IMPRESSION: Suspect that origin point of pain is at ischial tuberosity but pt is unable to pinpoint it. Tolerated reformer well with ability to use sled as tactile cue for neutral spine and pelvis rather than extreme post tilt. Still significantly limited in seated tolerance.  OBJECTIVE IMPAIRMENTS:  decreased ROM, decreased strength, impaired UE functional use, and pain.   ACTIVITY LIMITATIONS: carrying, lifting, sleeping, bathing, toileting, dressing, self feeding, reach over head, and hygiene/grooming  PARTICIPATION LIMITATIONS:  meal prep, cleaning, laundry, driving, shopping, community activity, occupation, and yard work  PERSONAL FACTORS: 1-2 comorbidities: previous replacement on the same shoulder osteoporosis; multi joint OA  are also affecting patient's functional outcome.   REHAB POTENTIAL: Good  CLINICAL DECISION MAKING: Stable/uncomplicated  EVALUATION COMPLEXITY: Low  GOALS: Goals reviewed with patient? Yes  SHORT TERM GOALS: Target date: 04/16/2023    Patient will increase passive flexion to 115 degrees  Baseline: Goal status: MET  2.  Patient will increase left shoulder  ER to 30 degrees  Baseline:  Goal status: MET  3.  Patient will use her left arm for ADL's below 90 degrees with > 2/10 pain  Baseline:  Goal status: MET   LONG TERM GOALS: Target date: POC DATE    The patient will reach overhead with left arm without pain in order to perform ADL's  Baseline:  Goal status: MET  2.  Patient will each behind her head without pain in order to per from her ADL's  Baseline:  Goal status: MET  3.  Patient will reach behind her back to L3 without pain in order to perform ADL's  Baseline:  Goal status: MET  4. Able to demo overhead activity without scapular winging bilaterally  Status: MET 5. Strength available for ADLs with weight/resistance such as washing dishes, vacuuming   Status: ongoing- has met for shoulder but limited by hip/back/buttock pain 6. Feel prepared to lift grand children when I have them.   Status: New 7. Able to sit for at least 10 minutes without needing to stand due to pain  Baseline: activated it hurts immediately, but if I am not I may be able to make it 5 minutes now!  Status: ongoing 8. Demonstrate proper core activation in exercises for lumbopelvic support  Status: New   PLAN: PT FREQUENCY: 2x/week  PT DURATION: 8 weeks  PLANNED INTERVENTIONS: 97110-Therapeutic exercises, 97530- Therapeutic activity, [id number]- Neuromuscular re-education, 97535- Self  Care, [id number]- Manual therapy, 7113- Aquatic Therapy, 97014- Electrical stimulation (unattended), 97035- Ultrasound, Patient/Family education, Taping, Dry Needling, DME instructions, Cryotherapy, and Moist heat   PLAN FOR NEXT SESSION:    Review core contraction.    Havah Ammon C. Francheska Villeda PT, DPT 11/26/23 12:39 PM

## 2023-11-27 DIAGNOSIS — H524 Presbyopia: Secondary | ICD-10-CM | POA: Diagnosis not present

## 2023-11-29 ENCOUNTER — Encounter: Payer: Self-pay | Admitting: Gastroenterology

## 2023-11-29 ENCOUNTER — Ambulatory Visit (INDEPENDENT_AMBULATORY_CARE_PROVIDER_SITE_OTHER): Admitting: Gastroenterology

## 2023-11-29 VITALS — BP 102/70 | HR 70 | Ht 70.0 in | Wt 133.0 lb

## 2023-11-29 DIAGNOSIS — R1011 Right upper quadrant pain: Secondary | ICD-10-CM

## 2023-11-29 NOTE — Progress Notes (Signed)
 Chief Complaint: Abdominal pain Primary GI Doctor: Dr. San  HPI:  Patient is a  63 year old female patient with past medical history of hyperlipidemia,osteoporosis, and Sibo, who presents for follow-up with main complaint of  Patient last seen in GI office in February 2024 by Dr. San for nausea and dysphagia.   Interval History    Patient presents with evaluation for RUQ pain for past 3-4 months. She reports it can radiate to the umbilicus.  She denies burning or chest pain. She denies GERD symptoms.  Occurs with or without eating. She reports it resolves very quickly and feels like a spasm. She notes the symptoms improved with laying down, massaging the area, or using eating pad.  She reports bowel movement every 1-2 days She is taking psyllium husk OTC per Dr. San long time ago. No problems with constipation or diarrhea. She follows very strict plant based diet. No new medications. Appetite good. No weight loss. History of Sibo but states she did not know she even had it at the time so cannot relate current symptoms to it.   GI procedures: 06/08/22 colonoscopy - Hemorrhoids found on perianal exam. - The entire examined colon is normal. - Non- bleeding internal hemorrhoids. - The examined portion of the ileum was normal.  06/08/22 EGD - Subtle esophageal mucosal changes in the lower esophagus. Dilated with 17 mm Savary dilator without mucosal rent. Biopsies were taken in the distal and proximal esophagus with a cold forceps for evaluation of eosinophilic esophagitis. - Z- line regular, 40 cm from the incisors. - Minimal, non- ulcer gastritis. Biopsied. - Normal examined duodenum. Path: Diagnosis 1. Surgical [P], gastric MILD CHRONIC GASTRITIS WITH LYMPHOID AGGREGATE AND REACTIVE EPITHELIAL CHANGES AND NEGATIVE FOR H. PYLORI, INTESTINAL METAPLASIA, DYSPLASIA AND CARCINOMA 2. Surgical [P], distal esophagus REACTIVE SQUAMOUS MUCOSA NEGATIVE FOR GLANDULAR EPITHELIUM,  EOSINOPHILS, DYSPLASIA AND CARCINOMA 3. Surgical [P], proximal esophagus MILDLY REACTIVE SQUAMOUS MUCOSA MINUTE FRAGMENT OF FUNDIC TYPE GASTRIC MUCOSA SUGGESTIVE OF INLET PATCH NEGATIVE FOR EOSINOPHILS, INTESTINAL METAPLASIA, DYSPLASIA AND CARCINOMA  Normal colonoscopy in 2014.    Wt Readings from Last 3 Encounters:  11/29/23 133 lb (60.3 kg)  09/03/23 142 lb (64.4 kg)  07/04/23 146 lb (66.2 kg)     Past Medical History:  Diagnosis Date   Allergy    Anemia    Arthritis    Hyperlipidemia    Insomnia    Mitral valve prolapse    PONV (postoperative nausea and vomiting)     Past Surgical History:  Procedure Laterality Date   AUGMENTATION MAMMAPLASTY Bilateral    Dec 2019   BREAST BIOPSY Right    axillary node benign   EYE SURGERY  2010   Lasik for distance correction; use one contact in left eye for reading now   JOINT REPLACEMENT  2021; 2022; 2025   reverse shoulder on the right; anatomic replacement on the left then conversion to reverse replacement due to failed anatomic   KNEE SURGERY  1988   right   SHOULDER ARTHROSCOPY WITH LABRAL REPAIR Right 08/28/2012   Procedure: RIGHT SHOULDER ARTHROSCOPY WITH LABRAL DEBRIDEMENT, ASPIRATION AND DECOMPRESSION OF PARALABRAL CYST;  Surgeon: Franky CHRISTELLA Pointer, MD;  Location: [facility];  Service: Orthopedics;  Laterality: Right;   TOTAL SHOULDER ARTHROPLASTY Right 03/24/2020   Procedure: RIGHT REVERSE SHOULDER ARTHROPLASTY;  Surgeon: Pointer Franky, MD;  Location: [facility];  Service: Orthopedics;  Laterality: Right;    TOTAL SHOULDER ARTHROPLASTY Left 02/23/2021   Procedure: TOTAL SHOULDER ARTHROPLASTY;  Surgeon: Melita Drivers, MD;  Location: [facility];  Service: Orthopedics;  Laterality: Left;   TOTAL SHOULDER REVISION Left 02/07/2023   Procedure: Excision Left shoulder nodule; Conversion of left anatomic shoulder arthroplasty to reverse shoulder arthroplasty;  Surgeon: Melita Drivers, MD;  Location: [facility];  Service: Orthopedics;  Laterality:  Left;    Current Outpatient Medications  Medication Sig Dispense Refill   AMBULATORY NON FORMULARY MEDICATION Medication Name: Mindful Evening Cocoa Mix (Melatonin 3 mg, Theanine 5 mg, GABA 100 mg)     amoxicillin  (AMOXIL ) 500 MG capsule Take 4 capsules (2,000 mg total) by mouth one hour before dental procedure 4 capsule 0   aspirin  EC 81 MG tablet Take 1 tablet (81 mg total) by mouth daily. Swallow whole. 90 tablet 3   calcium  carbonate (CALCIUM  600) 600 MG TABS tablet Take 600-1,200 mg by mouth daily as needed (calcium  deficiency). Based on diet intake to achieve 1200 mg daily     Cholecalciferol (VITAMIN D ) 50 MCG (2000 UT) tablet Take 2,000 Units by mouth daily.     denosumab  (PROLIA ) 60 MG/ML SOSY injection Inject 60 mg into the skin every 6 (six) months.     Efinaconazole  10 % SOLN Apply 1 drop topically daily. 4 mL 11   MAGNESIUM GLYCINATE PO Take 500 mg by mouth daily. Chelated, supplies 50 mg of magnesium     Multiple Vitamin (MULTIVITAMIN WITH MINERALS) TABS Take 1 tablet by mouth daily.     Omega-3 Fatty Acids (FISH OIL ) 1200 MG CAPS Take 1,200 mg by mouth daily. Provides 600 mg Omega-3 fatty acids     Psyllium Husk POWD Take 1 Dose by mouth daily. 1 dose = 1 to 2 tablespoons     rosuvastatin  (CRESTOR ) 10 MG tablet Take 1 tablet (10 mg total) by mouth daily. 90 tablet 3   tretinoin  (RETIN-A ) 0.05 % cream Apply topically at bedtime. (Patient taking differently: Apply 1 Application topically at bedtime.) 45 g 2   Current Facility-Administered Medications  Medication Dose Route Frequency Provider Last Rate Last Admin   [START ON 01/05/2024] denosumab  (PROLIA ) injection 60 mg  60 mg Subcutaneous Once Shamleffer, Ibtehal Jaralla, MD        Allergies as of 11/29/2023 - Review Complete 11/29/2023  Allergen Reaction Noted   Codeine Nausea And Vomiting 01/12/2009   Hydrocodone Nausea And Vomiting 06/20/2010   Triple antibiotic pain relief [neomy-bacit-polymyx-pramoxine] Other (See  Comments) 02/23/2019   Triple antibiotic w/hydrocortisone  [bacitra-neomycin-polymyxin-hc]  04/23/2019   Contrast media [iodinated contrast media] Hives 09/16/2020    Family History  Problem Relation Age of Onset   Stroke Mother    Heart disease Mother    Hyperlipidemia Mother    Heart disease Father    Diabetes Sister    Heart disease Sister    Hyperlipidemia Sister    Hypertension Sister    Varicose Veins Sister    Diabetes Sister    Hyperlipidemia Sister    Hypertension Sister    Hyperlipidemia Sister    Hypertension Sister    Heart attack Brother 84   Diabetes Brother    Heart disease Brother    Hyperlipidemia Brother    Hypertension Brother    Sudden death Neg Hx    Breast cancer Neg Hx    Colon cancer Neg Hx    Esophageal cancer Neg Hx    Rectal cancer Neg Hx    Stomach cancer Neg Hx     Review of Systems:    Constitutional: No weight loss,  fever, chills, weakness or fatigue HEENT: Eyes: No change in vision               Ears, Nose, Throat:  No change in hearing or congestion Skin: No rash or itching Cardiovascular: No chest pain, chest pressure or palpitations   Respiratory: No SOB or cough Gastrointestinal: See HPI and otherwise negative Genitourinary: No dysuria or change in urinary frequency Neurological: No headache, dizziness or syncope Musculoskeletal: No new muscle or joint pain Hematologic: No bleeding or bruising Psychiatric: No history of depression or anxiety    Physical Exam:  Vital signs: BP 102/70   Pulse 70   Ht 5' 10 (1.778 m)   Wt 133 lb (60.3 kg)   LMP 08/14/2012   BMI 19.08 kg/m   Constitutional:   Pleasant female appears to be in NAD, Well developed, Well nourished, alert and cooperative Throat: Oral cavity and pharynx without inflammation, swelling or lesion.  Respiratory: Respirations even and unlabored. Lungs clear to auscultation bilaterally.   No wheezes, crackles, or rhonchi.  Cardiovascular: Normal S1, S2. Regular rate  and rhythm. No peripheral edema, cyanosis or pallor.  Gastrointestinal:  Soft, nondistended, nontender. No rebound or guarding. Normal bowel sounds. No appreciable masses or hepatomegaly. Rectal:  Not performed.  Msk:  Symmetrical without gross deformities. Without edema, no deformity or joint abnormality.  Neurologic:  Alert and  oriented x4;  grossly normal neurologically.  Skin:   Dry and intact without significant lesions or rashes.  RELEVANT LABS AND IMAGING: CBC    Latest Ref Rng & Units 09/03/2023   10:46 AM 05/16/2023    9:53 AM 03/01/2023   10:30 AM  CBC  WBC 4.0 - 10.5 K/uL 5.4  4.7  10.2   Hemoglobin 12.0 - 15.0 g/dL 85.9  85.0  87.0   Hematocrit 36.0 - 46.0 % 41.3  43.9  38.7   Platelets 150.0 - 400.0 K/uL 178.0  197  256      CMP     Latest Ref Rng & Units 09/03/2023   10:46 AM 05/16/2023    9:53 AM 03/01/2023   10:30 AM  CMP  Glucose 70 - 99 mg/dL 92  79  98   BUN 6 - 23 mg/dL 19  16  18    Creatinine 0.40 - 1.20 mg/dL 9.37  9.49  9.46   Sodium 135 - 145 mEq/L 131  139  137   Potassium 3.5 - 5.1 mEq/L 4.2  4.3  4.3   Chloride 96 - 112 mEq/L 97  102  100   CO2 19 - 32 mEq/L 30  29  29    Calcium  8.4 - 10.5 mg/dL CANCELED    9.1  9.3  8.9   Total Protein 6.0 - 8.3 g/dL 6.3  7.1  6.2   Total Bilirubin 0.2 - 1.2 mg/dL 0.5  0.5  0.4   Alkaline Phos 39 - 117 U/L 37     AST 0 - 37 U/L 31  27  25    ALT 0 - 35 U/L 19  19  21       Lab Results  Component Value Date   TSH 1.38 05/16/2023   06/2023 echo- Left ventricular ejection fraction, by estimation, is 60 to 65%.   06/2023 CTAP IMPRESSION: No acute findings in the abdomen or pelvis.  Assessment: Encounter Diagnosis  Name Primary?   RUQ pain Yes   63 year old female patient that presents with right upper quadrant abdominal discomfort she describes as a spasm.  Denies any known triggers.  Denies diarrhea or constipation.  Patient follows a very strict plant-based diet.  Patient had recent CT scan in [place] that was  normal.  Patient had EGD and colonoscopy back in April 2024.  We discussed reevaluating with lab work and offered antispasmodic.  She would like to avoid pharmaceutical interventions at this time.  She Akire Rennert benefit from over-the-counter IBgard.  Could also order abdominal ultrasound to rule out gallbladder issues.  If negative workup and no improvement could reconsider retreating SIBO.  Patient agrees to plan  Plan: - Order CMP, CBC, lipase  -abdominal abdominal ultrasound RUQ -OTC Ibgard samples  - if workup negative Maram Bently consider retreating for Sibo  Thank you for the courtesy of this consult. Please call me with any questions or concerns.   Daneisha Surges, FNP-C [facility] 11/29/2023, 2:31 PM  Cc: [name], [name], NP

## 2023-11-29 NOTE — Patient Instructions (Addendum)
 Recommend low fodmap diet OTC Ibgard (natural peppermint, relaxes smooth muscle in GI tract)  You have been scheduled for an abdominal ultrasound at [facility] (Entrance A) on 12/05/23 at 7 am. Please arrive 30 minutes prior to your appointment for registration. Make certain not to have anything to eat or drink after midnight prior to your appointment. Should you need to reschedule your appointment, please contact radiology at [phone]. This test typically takes about 30 minutes to perform.  Your provider has requested that you go to the basement level for lab work before leaving today. Press B on the elevator. The lab is located at the first door on the left as you exit the elevator.   Let the lab know you have lab orders in the system from 07/04/23 with Lonni Cash so they can draw those while you're there as well.   _______________________________________________________  If your blood pressure at your visit was 140/90 or greater, please contact your primary care physician to follow up on this.  _______________________________________________________  If you are age 62 or older, your body mass index should be between 23-30. Your Body mass index is 19.08 kg/m. If this is out of the aforementioned range listed, please consider follow up with your Primary Care Provider.  If you are age 68 or younger, your body mass index should be between 19-25. Your Body mass index is 19.08 kg/m. If this is out of the aformentioned range listed, please consider follow up with your Primary Care Provider.   ________________________________________________________  The [facility] providers would like to encourage you to use MYCHART to communicate with providers for non-urgent requests or questions.  Due to long hold times on the telephone, sending your provider a message by [facility] may be a faster and more efficient way to get a response.  Please allow 48 business hours for a response.   Please remember that this is for non-urgent requests.  _______________________________________________________  [facility] is using a team-based approach to care.  Your team is made up of your doctor and two to three APPS. Our APPS (Nurse Practitioners and Physician Assistants) work with your physician to ensure care continuity for you. They are fully qualified to address your health concerns and develop a treatment plan. They communicate directly with your gastroenterologist to care for you. Seeing the Advanced Practice Practitioners on your physician's team can help you by facilitating care more promptly, often allowing for earlier appointments, access to diagnostic testing, procedures, and other specialty referrals.

## 2023-11-30 LAB — GLUCOSE, POCT (MANUAL RESULT ENTRY): Glucose Fasting, POC: 101 mg/dL — AB (ref 70–99)

## 2023-11-30 NOTE — Progress Notes (Signed)
 Pt screened by A. Edwards for HTN, fasting blood sugar.

## 2023-12-02 ENCOUNTER — Encounter (HOSPITAL_BASED_OUTPATIENT_CLINIC_OR_DEPARTMENT_OTHER): Payer: Self-pay | Admitting: Physical Therapy

## 2023-12-02 ENCOUNTER — Ambulatory Visit (HOSPITAL_BASED_OUTPATIENT_CLINIC_OR_DEPARTMENT_OTHER): Admitting: Physical Therapy

## 2023-12-02 DIAGNOSIS — M25551 Pain in right hip: Secondary | ICD-10-CM

## 2023-12-02 DIAGNOSIS — M25612 Stiffness of left shoulder, not elsewhere classified: Secondary | ICD-10-CM | POA: Diagnosis not present

## 2023-12-02 DIAGNOSIS — M25512 Pain in left shoulder: Secondary | ICD-10-CM

## 2023-12-02 NOTE — Therapy (Signed)
 OUTPATIENT PHYSICAL THERAPY UPPER EXTREMITY TREATMENT   Patient Name: REHANNA OLOUGHLIN MRN: [id number] DOB:[birth year], 63 y.o., female Today's Date: 12/02/2023  END OF SESSION:  PT End of Session - 12/02/23 1243     Visit Number 41    Number of Visits 51    Date for Recertification  02/06/24    PT Start Time 1105    PT Stop Time 1149    PT Time Calculation (min) 44 min    Activity Tolerance Patient tolerated treatment well    Behavior During Therapy [facility] for tasks assessed/performed                          Past Medical History:  Diagnosis Date   Allergy    Anemia    Arthritis    Hyperlipidemia    Insomnia    Mitral valve prolapse    PONV (postoperative nausea and vomiting)    Past Surgical History:  Procedure Laterality Date   AUGMENTATION MAMMAPLASTY Bilateral    Dec 2019   BREAST BIOPSY Right    axillary node benign   EYE SURGERY  2010   Lasik for distance correction; use one contact in left eye for reading now   JOINT REPLACEMENT  2021; 2022; 2025   reverse shoulder on the right; anatomic replacement on the left then conversion to reverse replacement due to failed anatomic   KNEE SURGERY  1988   right   SHOULDER ARTHROSCOPY WITH LABRAL REPAIR Right 08/28/2012   Procedure: RIGHT SHOULDER ARTHROSCOPY WITH LABRAL DEBRIDEMENT, ASPIRATION AND DECOMPRESSION OF PARALABRAL CYST;  Surgeon: Franky CHRISTELLA Pointer, MD;  Location: [facility];  Service: Orthopedics;  Laterality: Right;   TOTAL SHOULDER ARTHROPLASTY Right 03/24/2020   Procedure: RIGHT REVERSE SHOULDER ARTHROPLASTY;  Surgeon: Pointer Franky, MD;  Location: [facility];  Service: Orthopedics;  Laterality: Right;    TOTAL SHOULDER ARTHROPLASTY Left 02/23/2021   Procedure: TOTAL SHOULDER ARTHROPLASTY;  Surgeon: Pointer Franky, MD;  Location: [facility];  Service: Orthopedics;  Laterality: Left;   TOTAL SHOULDER REVISION Left 02/07/2023   Procedure: Excision Left shoulder nodule; Conversion of left anatomic  shoulder arthroplasty to reverse shoulder arthroplasty;  Surgeon: Pointer Franky, MD;  Location: [facility];  Service: Orthopedics;  Laterality: Left;   Patient Active Problem List   Diagnosis Date Noted   B12 deficiency 09/03/2023   Onychomycosis 05/10/2023   Hypercalciuria 02/02/2023   DDD (degenerative disc disease), cervical 04/13/2021   Osteoporosis without current pathological fracture 10/28/2020   Nonallopathic lesion of sacral region 02/26/2020   Hot flashes 02/23/2019   Other insomnia 02/23/2019   Cystic acne vulgaris 02/23/2019   Bunion of right foot 02/23/2019   Varicose 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   From Initial Evaluation: Patient had a left standard total shoulder replacement last year.  She did well for about 6 months then began having progressive pain in the shoulder.  She had to have a shoulder revision with a reverse total shoulder.  At this time her pain is well-controlled.  She is also had a right shoulder replaced.  When she had that replaced she had significant pain.  She reports this 1 is better.  She is wearing her sling outside the house and when sleeping.  She is otherwise has been wearing her sling.  She has increased pain when she moves her arm.  She has been compliant  with her HEP so far. Hand dominance: Right  PERTINENT HISTORY: Multijoint arthritis including knees and hips.  Osteoporosis, right shoulder replacement, mitral valve prolapse  PAIN:  Are you having pain? Yes: NPRS scale: 0/10  Pain location: hips to neck Pain description: sore Aggravating factors: Use of the arm Relieving factors: Not using the arm is much  PRECAUTIONS: Shoulder  RED FLAGS: None   WEIGHT BEARING RESTRICTIONS: No  FALLS:  Has patient fallen in last 6 months? No  LIVING ENVIRONMENT:   OCCUPATION: Nurse   PLOF: Independent  PATIENT GOALS:  Patient wants to be functional   OBJECTIVE:  Note: Objective measures were completed at Evaluation unless otherwise noted. IMAGING:  MRI Rt hip 8/21 IMPRESSION: 1. Mild right hip osteoarthritis. 2. Bilateral hamstring and gluteal tendinosis. 3. Mild peritrochanteric bursitis bilaterally. 4. Small volume free fluid within the pelvis, nonspecific.  MRI lumbar 8/12 IMPRESSION: 1. Mild canal stenosis at L3-L4. 2. Moderate left and mild right foraminal stenosis at L5-S1 secondary to disc bulging and facet arthropathy.   PATIENT SURVEYS :  UEFI 40/80   UPPER EXTREMITY ROM:   Passive ROM Right eval Left eval Left 1/21 L  L  Shoulder flexion  85 106 130AROM 160PROM  148AROM supine 158PROM  Shoulder extension       Shoulder abduction     112 active supine  Shoulder adduction       Shoulder internal rotation  Can sit on body comfortably   L gute  65/L iliac crest but can get to L4 with some compensation  Shoulder external rotation  20 26  50/T3  Elbow flexion       Elbow extension       Wrist flexion       Wrist extension       Wrist ulnar deviation       Wrist radial deviation       Wrist pronation       Wrist supination       (Blank rows = not tested)  UPPER EXTREMITY MMT:  MMT Right 3/25 Left 3/25 Lt 4/22 Rt/Lt 9/25  Shoulder flexion 15.4 8.2 9.5 24.4/16.6  Shoulder extension       Shoulder abduction 9.9 6.8 7.3 20.6/16.7  Shoulder adduction      Shoulder internal rotation 11.3 8.8 6.4 15.4/11.9  Shoulder external rotation 11.5 8.1 7.8 15.3/12.5  (Blank rows = not tested)    8/21- did not take strength tests for LEs due to high levels of pain  MMT Right 9/25 Left 9/25  Hip flexion 31.8 29.6  Hip extension    Hip abduction 36.5 36.9  Hip adduction    Hip internal rotation    Hip external rotation    Knee flexion 25.5 26.9  Knee extension 48.1 47.1   (  Blank rows = not tested)    PALPATION:  No unexpected TTP    LUMBAR SCREEN 7/3:  Negative change in standing extension, increased pain in flexion  Negative for change in prone on elbows static or dynamic  Negative for change with lumbar mobilizations  Negative SLR passive, positive for concordant pain in active SLR  Tightness in hamstrings & lateral hip  Negative concordant pain in resisted hamstring MMT or hip ext MMT  Negative concordant pain in sidelying hip abd (but is weak), proximal HS pain with sidelying hip abd+hip flexion  Negative cocordant pain with supine march bent knee, positive concordant pain active SLR on Rt (no change with movement of left leg)  Decreased concordant pain for a short amount of time following IASTM to hamstrings & STM to glut med/min                                                                                                                            TREATMENT DATE:  Treatment                            10/13: Blank lines following charge title = not provided on this treatment date.   Manual:  TPDN YES Trigger Point Dry Needling  Subsequent Treatment: Instructions provided previously at initial dry needling treatment.   Patient Verbal Consent Given: Yes Education Handout Provided: Previously Provided Muscles Treated: Rt glut max & med Electrical Stimulation Performed: No Treatment Response/Outcome: decreased concordant spasm STM and bending of glut  fibers Seated Ant/post tilt with PA mob of right upper sacrum in ant tilt   Qped hip hike ball bw legs Qped hip ext- ball behind knee, extended knee resulted in anterior shift of femoral head Seated ab set to press ball back into wall Qped rocking with anterior tilt and AP pressure to upper right quadrant of sacrum Standing, slight hip hinge, glut set   10/7  Used bar in group class room for UE cues to hips in lunges, squads, hip flexion Seated isometric hip activation in all motions Seated hip hinge with adductor use to pull femur posteriorly Stnading with foot on bar- use of pelvic tilt for HS stretch Reformer bar & feet in straps press for hip flexion and tailbone down- rolled blue towel to bring ground up Reformer 1R1Y hands in straps press and circles with LEs at 90/90  9/25 Lunge with hinge and adductor activation Standing hip hinge with Rt hip press back Seated activation in all motions for car Vacuuming form/pressing hip back      PATIENT EDUCATION: Education details:  HEP, exercise form, rationale of interventions, protocol restrictions and limitations, protocol ranges, and ROM findings Person educated: Patient Education method: Explanation, Demonstration, Tactile cues, Verbal cues Education comprehension: verbalized understanding, returned demonstration, verbal cues required, tactile cues required, and needs further education  HOME EXERCISE PROGRAM: Access Code: [email or web address] URL: [email or web address]   ASSESSMENT:  CLINICAL  IMPRESSION: Concordant pain in standing upon palpation to Rt lateral sacral border at mid way and along sacrotuberous ligament. At beginning of session, notable protrusion of Rt upper quadrant of sacrum vs Lt.  Pain continues to centralze. Pt is unable to demo glut contraction in standing or prone position and will work on this at home.    OBJECTIVE IMPAIRMENTS:  decreased ROM, decreased strength, impaired UE functional use, and  pain.   ACTIVITY LIMITATIONS: carrying, lifting, sleeping, bathing, toileting, dressing, self feeding, reach over head, and hygiene/grooming  PARTICIPATION LIMITATIONS: meal prep, cleaning, laundry, driving, shopping, community activity, occupation, and yard work  PERSONAL FACTORS: 1-2 comorbidities: previous replacement on the same shoulder osteoporosis; multi joint OA  are also affecting patient's functional outcome.   REHAB POTENTIAL: Good  CLINICAL DECISION MAKING: Stable/uncomplicated  EVALUATION COMPLEXITY: Low  GOALS: Goals reviewed with patient? Yes  SHORT TERM GOALS: Target date: 04/16/2023    Patient will increase passive flexion to 115 degrees  Baseline: Goal status: MET  2.  Patient will increase left shoulder  ER to 30 degrees  Baseline:  Goal status: MET  3.  Patient will use her left arm for ADL's below 90 degrees with > 2/10 pain  Baseline:  Goal status: MET   LONG TERM GOALS: Target date: POC DATE    The patient will reach overhead with left arm without pain in order to perform ADL's  Baseline:  Goal status: MET  2.  Patient will each behind her head without pain in order to per from her ADL's  Baseline:  Goal status: MET  3.  Patient will reach behind her back to L3 without pain in order to perform ADL's  Baseline:  Goal status: MET  4. Able to demo overhead activity without scapular winging bilaterally  Status: MET 5. Strength available for ADLs with weight/resistance such as washing dishes, vacuuming   Status: ongoing- has met for shoulder but limited by hip/back/buttock pain 6. Feel prepared to lift grand children when I have them.   Status: New 7. Able to sit for at least 10 minutes without needing to stand due to pain  Baseline: activated it hurts immediately, but if I am not I may be able to make it 5 minutes now!  Status: ongoing 8. Demonstrate proper core activation in exercises for lumbopelvic support  Status: New   PLAN: PT  FREQUENCY: 2x/week  PT DURATION: 8 weeks  PLANNED INTERVENTIONS: 97110-Therapeutic exercises, 97530- Therapeutic activity, [id number]- Neuromuscular re-education, 97535- Self Care, [id number]- Manual therapy, 7113- Aquatic Therapy, 97014- Electrical stimulation (unattended), 97035- Ultrasound, Patient/Family education, Taping, Dry Needling, DME instructions, Cryotherapy, and Moist heat   PLAN FOR NEXT SESSION:    Review core contraction.    Lilyana Lippman C. Refugia Laneve PT, DPT 12/02/23 12:49 PM

## 2023-12-05 ENCOUNTER — Ambulatory Visit: Payer: Self-pay | Admitting: Gastroenterology

## 2023-12-05 ENCOUNTER — Ambulatory Visit (HOSPITAL_COMMUNITY)
Admission: RE | Admit: 2023-12-05 | Discharge: 2023-12-05 | Disposition: A | Discharge status: Home / Self Care | Source: Ambulatory Visit | Attending: Gastroenterology | Admitting: Gastroenterology

## 2023-12-05 DIAGNOSIS — R1011 Right upper quadrant pain: Secondary | ICD-10-CM | POA: Diagnosis not present

## 2023-12-06 ENCOUNTER — Encounter (HOSPITAL_BASED_OUTPATIENT_CLINIC_OR_DEPARTMENT_OTHER): Payer: Self-pay | Admitting: Physical Therapy

## 2023-12-10 ENCOUNTER — Ambulatory Visit (HOSPITAL_BASED_OUTPATIENT_CLINIC_OR_DEPARTMENT_OTHER): Admitting: Physical Therapy

## 2023-12-10 ENCOUNTER — Encounter (HOSPITAL_BASED_OUTPATIENT_CLINIC_OR_DEPARTMENT_OTHER): Payer: Self-pay | Admitting: Physical Therapy

## 2023-12-10 DIAGNOSIS — M25612 Stiffness of left shoulder, not elsewhere classified: Secondary | ICD-10-CM | POA: Diagnosis not present

## 2023-12-10 DIAGNOSIS — M25551 Pain in right hip: Secondary | ICD-10-CM

## 2023-12-10 DIAGNOSIS — M25512 Pain in left shoulder: Secondary | ICD-10-CM

## 2023-12-10 NOTE — Therapy (Signed)
 OUTPATIENT PHYSICAL THERAPY UPPER EXTREMITY TREATMENT   Patient Name: Tara [name] MRN: [id number] DOB:1960/06/02, 63 y.o., female Today's Date: 12/10/2023  END OF SESSION:  PT End of Session - 12/10/23 0849     Visit Number 42    Number of Visits 51    Date for Recertification  02/06/24    PT Start Time 0848    PT Stop Time 0928    PT Time Calculation (min) 40 min    Activity Tolerance Patient tolerated treatment well    Behavior During Therapy [facility] for tasks assessed/performed                          Past Medical History:  Diagnosis Date   Allergy    Anemia    Arthritis    Hyperlipidemia    Insomnia    Mitral valve prolapse    PONV (postoperative nausea and vomiting)    Past Surgical History:  Procedure Laterality Date   AUGMENTATION MAMMAPLASTY Bilateral    Dec 2019   BREAST BIOPSY Right    axillary node benign   EYE SURGERY  2010   Lasik for distance correction; use one contact in left eye for reading now   JOINT REPLACEMENT  2021; 2022; 2025   reverse shoulder on the right; anatomic replacement on the left then conversion to reverse replacement due to failed anatomic   KNEE SURGERY  1988   right   SHOULDER ARTHROSCOPY WITH LABRAL REPAIR Right 08/28/2012   Procedure: RIGHT SHOULDER ARTHROSCOPY WITH LABRAL DEBRIDEMENT, ASPIRATION AND DECOMPRESSION OF PARALABRAL CYST;  Surgeon: Franky CHRISTELLA Pointer, MD;  Location: [facility];  Service: Orthopedics;  Laterality: Right;   TOTAL SHOULDER ARTHROPLASTY Right 03/24/2020   Procedure: RIGHT REVERSE SHOULDER ARTHROPLASTY;  Surgeon: Pointer Franky, MD;  Location: [facility];  Service: Orthopedics;  Laterality: Right;    TOTAL SHOULDER ARTHROPLASTY Left 02/23/2021   Procedure: TOTAL SHOULDER ARTHROPLASTY;  Surgeon: Pointer Franky, MD;  Location: [facility];  Service: Orthopedics;  Laterality: Left;   TOTAL SHOULDER REVISION Left 02/07/2023   Procedure: Excision Left shoulder nodule; Conversion of left anatomic  shoulder arthroplasty to reverse shoulder arthroplasty;  Surgeon: Pointer Franky, MD;  Location: [facility];  Service: Orthopedics;  Laterality: Left;   Patient Active Problem List   Diagnosis Date Noted   B12 deficiency 09/03/2023   Onychomycosis 05/10/2023   Hypercalciuria 02/02/2023   DDD (degenerative disc disease), cervical 04/13/2021   Osteoporosis without current pathological fracture 10/28/2020   Nonallopathic lesion of sacral region 02/26/2020   Hot flashes 02/23/2019   Other insomnia 02/23/2019   Cystic acne vulgaris 02/23/2019   Bunion of right foot 02/23/2019   Varicose 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 02/23/2019   Multiple atypical nevi 02/23/2019   Mitral valve prolapse 03/03/2009    PCP: Roxan [name]   REFERRING PROVIDER: JONETTA Franky Supple (shoulder) Arthea Sharps, DO (hip)  REFERRING DIAG:  Diagnosis  [phone] (ICD-10-CM) - Presence of left artificial shoulder joint  Pain right hip  THERAPY DIAG:  Acute pain of left shoulder  Pain in right hip  Rationale for Evaluation and Treatment: Rehabilitation  ONSET DATE:  DOS: 02/07/2023  SUBJECTIVE:  SUBJECTIVE STATEMENT: After Monday needling, that evening I was in excruciating pain, felt a difference for the better on Tuesday and on wed it was back with a vengeance.   From Initial Evaluation: Patient had a left standard total shoulder replacement last year.  She did well for about 6 months then began having progressive pain in the shoulder.  She had to have a shoulder revision with a reverse total shoulder.  At this time her pain is well-controlled.  She is also had a right shoulder replaced.  When she had that replaced she had significant pain.  She reports this 1 is better.  She is wearing her sling outside the house and when  sleeping.  She is otherwise has been wearing her sling.  She has increased pain when she moves her arm.  She has been compliant with her HEP so far. Hand dominance: Right  PERTINENT HISTORY: Multijoint arthritis including knees and hips.  Osteoporosis, right shoulder replacement, mitral valve prolapse  PAIN:  Are you having pain? Yes: NPRS scale: 0/10  Pain location: hips to neck Pain description: sore Aggravating factors: Use of the arm Relieving factors: Not using the arm is much  PRECAUTIONS: Shoulder  RED FLAGS: None   WEIGHT BEARING RESTRICTIONS: No  FALLS:  Has patient fallen in last 6 months? No  LIVING ENVIRONMENT:   OCCUPATION: Nurse   PLOF: Independent  PATIENT GOALS:  Patient wants to be functional   OBJECTIVE:  Note: Objective measures were completed at Evaluation unless otherwise noted. IMAGING:  MRI Rt hip 8/21 IMPRESSION: 1. Mild right hip osteoarthritis. 2. Bilateral hamstring and gluteal tendinosis. 3. Mild peritrochanteric bursitis bilaterally. 4. Small volume free fluid within the pelvis, nonspecific.  MRI lumbar 8/12 IMPRESSION: 1. Mild canal stenosis at L3-L4. 2. Moderate left and mild right foraminal stenosis at L5-S1 secondary to disc bulging and facet arthropathy.   PATIENT SURVEYS :  UEFI 40/80   UPPER EXTREMITY ROM:   Passive ROM Right eval Left eval Left 1/21 L  L  Shoulder flexion  85 106 130AROM 160PROM  148AROM supine 158PROM  Shoulder extension       Shoulder abduction     112 active supine  Shoulder adduction       Shoulder internal rotation  Can sit on body comfortably   L gute  65/L iliac crest but can get to L4 with some compensation  Shoulder external rotation  20 26  50/T3  Elbow flexion       Elbow extension       Wrist flexion       Wrist extension       Wrist ulnar deviation       Wrist radial deviation       Wrist pronation       Wrist supination       (Blank rows = not tested)  UPPER EXTREMITY  MMT:  MMT Right 3/25 Left 3/25 Lt 4/22 Rt/Lt 9/25  Shoulder flexion 15.4 8.2 9.5 24.4/16.6  Shoulder extension      Shoulder abduction 9.9 6.8 7.3 20.6/16.7  Shoulder adduction      Shoulder internal rotation 11.3 8.8 6.4 15.4/11.9  Shoulder external rotation 11.5 8.1 7.8 15.3/12.5  (Blank rows = not tested)    8/21- did not take strength tests for LEs due to high levels of pain  MMT Right 9/25 Left 9/25  Hip flexion 31.8 29.6  Hip extension    Hip abduction 36.5 36.9  Hip adduction  Hip internal rotation    Hip external rotation    Knee flexion 25.5 26.9  Knee extension 48.1 47.1   (Blank rows = not tested)    PALPATION:  No unexpected TTP    LUMBAR SCREEN 7/3:  Negative change in standing extension, increased pain in flexion  Negative for change in prone on elbows static or dynamic  Negative for change with lumbar mobilizations  Negative SLR passive, positive for concordant pain in active SLR  Tightness in hamstrings & lateral hip  Negative concordant pain in resisted hamstring MMT or hip ext MMT  Negative concordant pain in sidelying hip abd (but is weak), proximal HS pain with sidelying hip abd+hip flexion  Negative cocordant pain with supine march bent knee, positive concordant pain active SLR on Rt (no change with movement of left leg)  Decreased concordant pain for a short amount of time following IASTM to hamstrings & STM to glut med/min                                                                                                                            TREATMENT DATE:  12/10/23 IASTM Rt iliac crest and Rt sacral border Prone Rt sacrum PA mobs Supine eccentric march in neutral & turnout Squat on toes, sponge under heels  Static stance on bosu with mirror for VC  Treatment                            10/13: Blank lines following charge title = not provided on this treatment date.   Manual:  TPDN YES Trigger Point Dry Needling  Subsequent  Treatment: Instructions provided previously at initial dry needling treatment.   Patient Verbal Consent Given: Yes Education Handout Provided: Previously Provided Muscles Treated: Rt glut max & med Electrical Stimulation Performed: No Treatment Response/Outcome: decreased concordant spasm STM and bending of glut fibers Seated Ant/post tilt with PA mob of right upper sacrum in ant tilt   Qped hip hike ball bw legs Qped hip ext- ball behind knee, extended knee resulted in anterior shift of femoral head Seated ab set to press ball back into wall Qped rocking with anterior tilt and AP pressure to upper right quadrant of sacrum Standing, slight hip hinge, glut set   10/7  Used bar in group class room for UE cues to hips in lunges, squads, hip flexion Seated isometric hip activation in all motions Seated hip hinge with adductor use to pull femur posteriorly Stnading with foot on bar- use of pelvic tilt for HS stretch Reformer bar & feet in straps press for hip flexion and tailbone down- rolled blue towel to bring ground up Reformer 1R1Y hands in straps press and circles with LEs at 90/90  9/25 Lunge with hinge and adductor activation Standing hip hinge with Rt hip press back Seated activation in all motions for car Vacuuming form/pressing hip back      PATIENT  EDUCATION: Education details:  HEP, exercise form, rationale of interventions, protocol restrictions and limitations, protocol ranges, and ROM findings Person educated: Patient Education method: Explanation, Demonstration, Tactile cues, Verbal cues Education comprehension: verbalized understanding, returned demonstration, verbal cues required, tactile cues required, and needs further education  HOME EXERCISE PROGRAM: Access Code: [email or web address] URL: [email or web address]   ASSESSMENT:  CLINICAL IMPRESSION:  anterior pelvis on plum line with LOB in stacked posture. Overuse of quads when placed in stacked  posture-core exercises for eccentric strengthening of hip flexors to encourage posterior force to femoral head in acetabulum.    OBJECTIVE IMPAIRMENTS:  decreased ROM, decreased strength, impaired UE functional use, and pain.   ACTIVITY LIMITATIONS: carrying, lifting, sleeping, bathing, toileting, dressing, self feeding, reach over head, and hygiene/grooming  PARTICIPATION LIMITATIONS: meal prep, cleaning, laundry, driving, shopping, community activity, occupation, and yard work  PERSONAL FACTORS: 1-2 comorbidities: previous replacement on the same shoulder osteoporosis; multi joint OA  are also affecting patient's functional outcome.   REHAB POTENTIAL: Good  CLINICAL DECISION MAKING: Stable/uncomplicated  EVALUATION COMPLEXITY: Low  GOALS: Goals reviewed with patient? Yes  SHORT TERM GOALS: Target date: 04/16/2023    Patient will increase passive flexion to 115 degrees  Baseline: Goal status: MET  2.  Patient will increase left shoulder  ER to 30 degrees  Baseline:  Goal status: MET  3.  Patient will use her left arm for ADL's below 90 degrees with > 2/10 pain  Baseline:  Goal status: MET   LONG TERM GOALS: Target date: POC DATE    The patient will reach overhead with left arm without pain in order to perform ADL's  Baseline:  Goal status: MET  2.  Patient will each behind her head without pain in order to per from her ADL's  Baseline:  Goal status: MET  3.  Patient will reach behind her back to L3 without pain in order to perform ADL's  Baseline:  Goal status: MET  4. Able to demo overhead activity without scapular winging bilaterally  Status: MET 5. Strength available for ADLs with weight/resistance such as washing dishes, vacuuming   Status: ongoing- has met for shoulder but limited by hip/back/buttock pain 6. Feel prepared to lift grand children when I have them.   Status: New 7. Able to sit for at least 10 minutes without needing to stand due to  pain  Baseline: activated it hurts immediately, but if I am not I may be able to make it 5 minutes now!  Status: ongoing 8. Demonstrate proper core activation in exercises for lumbopelvic support  Status: New   PLAN: PT FREQUENCY: 2x/week  PT DURATION: 8 weeks  PLANNED INTERVENTIONS: 97110-Therapeutic exercises, 97530- Therapeutic activity, [id number]- Neuromuscular re-education, 97535- Self Care, 02859- Manual therapy, 7113- Aquatic Therapy, 97014- Electrical stimulation (unattended), 97035- Ultrasound, Patient/Family education, Taping, Dry Needling, DME instructions, Cryotherapy, and Moist heat   PLAN FOR NEXT SESSION:    Review core contraction.    Chrisanne Loose C. Anamae Rochelle PT, DPT 12/10/23 12:34 PM

## 2023-12-12 ENCOUNTER — Telehealth: Payer: Self-pay

## 2023-12-12 ENCOUNTER — Other Ambulatory Visit: Payer: Self-pay

## 2023-12-12 ENCOUNTER — Telehealth: Payer: Self-pay | Admitting: Pharmacist

## 2023-12-12 DIAGNOSIS — M810 Age-related osteoporosis without current pathological fracture: Secondary | ICD-10-CM

## 2023-12-12 MED ORDER — DENOSUMAB 60 MG/ML ~~LOC~~ SOSY
60.0000 mg | PREFILLED_SYRINGE | SUBCUTANEOUS | 0 refills | Status: DC
  Filled 2023-12-12: qty 1, 180d supply, fill #0

## 2023-12-12 NOTE — Telephone Encounter (Signed)
 Pharmacy Patient Advocate Encounter   Received notification from Patient Pharmacy that prior authorization for Prolia  is required/requested.   Insurance verification completed.   The patient is insured through [facility].   Per test claim: PA required; PA submitted to above mentioned insurance via Latent Key/confirmation #/EOC [id number] Status is pending

## 2023-12-12 NOTE — Telephone Encounter (Signed)
 Called patient to schedule an appointment for the [facility]. I was unable to reach the patient so I left a HIPAA-compliant message requesting that the patient return my call.   Tara [name], PharmD, JAQUELINE, CPP Clinical Pharmacist [facility] & [facility] [phone]

## 2023-12-12 NOTE — Progress Notes (Signed)
 Pharmacy Patient Advocate Encounter  Insurance verification completed.   The patient is insured through [facility]   Ran test claim for Prolia. PA required.   This test claim was processed through [facility]- copay amounts may vary at other pharmacies due to pharmacy/plan contracts, or as the patient moves through the different stages of their insurance plan.

## 2023-12-16 ENCOUNTER — Encounter (HOSPITAL_BASED_OUTPATIENT_CLINIC_OR_DEPARTMENT_OTHER): Admitting: Physical Therapy

## 2023-12-16 ENCOUNTER — Other Ambulatory Visit: Payer: Self-pay

## 2023-12-16 ENCOUNTER — Encounter (HOSPITAL_BASED_OUTPATIENT_CLINIC_OR_DEPARTMENT_OTHER): Payer: Self-pay | Admitting: Physical Therapy

## 2023-12-16 ENCOUNTER — Ambulatory Visit (HOSPITAL_BASED_OUTPATIENT_CLINIC_OR_DEPARTMENT_OTHER): Payer: Self-pay | Admitting: Physical Therapy

## 2023-12-16 DIAGNOSIS — M25512 Pain in left shoulder: Secondary | ICD-10-CM

## 2023-12-16 DIAGNOSIS — M25551 Pain in right hip: Secondary | ICD-10-CM

## 2023-12-16 DIAGNOSIS — M25612 Stiffness of left shoulder, not elsewhere classified: Secondary | ICD-10-CM | POA: Diagnosis not present

## 2023-12-16 NOTE — Telephone Encounter (Signed)
 Pharmacy Patient Advocate Encounter  Received notification from [facility] that Prior Authorization for Prolia  has been APPROVED from 12/13/23 to 12/11/24   PA #/Case ID/Reference #: [id number]

## 2023-12-16 NOTE — Telephone Encounter (Unsigned)
 Copied from CRM [id number]. Topic: General - Other >> Dec 13, 2023  5:39 PM Zebedee SAUNDERS wrote: Reason for CRM: Pt returning Fleeta Tonia Garnette LITTIE, RPH-CPP Pharmacist call, please call pt at [phone].

## 2023-12-16 NOTE — Progress Notes (Signed)
 PA approved. Copay is $0.

## 2023-12-16 NOTE — Therapy (Signed)
 OUTPATIENT PHYSICAL THERAPY UPPER EXTREMITY TREATMENT   Patient Name: Tara [name] MRN: [id number] DOB:[birth year], 63 y.o., female Today's Date: 12/17/2023  END OF SESSION:  PT End of Session - 12/16/23 1405     Visit Number 43    Number of Visits 51    Date for Recertification  02/06/24    PT Start Time 1404    PT Stop Time 1443    PT Time Calculation (min) 39 min    Activity Tolerance Patient tolerated treatment well    Behavior During Therapy [facility] for tasks assessed/performed                           Past Medical History:  Diagnosis Date   Allergy    Anemia    Arthritis    Hyperlipidemia    Insomnia    Mitral valve prolapse    PONV (postoperative nausea and vomiting)    Past Surgical History:  Procedure Laterality Date   AUGMENTATION MAMMAPLASTY Bilateral    Dec 2019   BREAST BIOPSY Right    axillary node benign   EYE SURGERY  2010   Lasik for distance correction; use one contact in left eye for reading now   JOINT REPLACEMENT  2021; 2022; 2025   reverse shoulder on the right; anatomic replacement on the left then conversion to reverse replacement due to failed anatomic   KNEE SURGERY  1988   right   SHOULDER ARTHROSCOPY WITH LABRAL REPAIR Right 08/28/2012   Procedure: RIGHT SHOULDER ARTHROSCOPY WITH LABRAL DEBRIDEMENT, ASPIRATION AND DECOMPRESSION OF PARALABRAL CYST;  Surgeon: Franky CHRISTELLA Pointer, MD;  Location: [facility] OR;  Service: Orthopedics;  Laterality: Right;   TOTAL SHOULDER ARTHROPLASTY Right 03/24/2020   Procedure: RIGHT REVERSE SHOULDER ARTHROPLASTY;  Surgeon: Pointer Franky, MD;  Location: [facility] ORS;  Service: Orthopedics;  Laterality: Right;    TOTAL SHOULDER ARTHROPLASTY Left 02/23/2021   Procedure: TOTAL SHOULDER ARTHROPLASTY;  Surgeon: Pointer Franky, MD;  Location: [facility] ORS;  Service: Orthopedics;  Laterality: Left;   TOTAL SHOULDER REVISION Left 02/07/2023   Procedure: Excision Left shoulder nodule; Conversion of left anatomic  shoulder arthroplasty to reverse shoulder arthroplasty;  Surgeon: Pointer Franky, MD;  Location: [facility] ORS;  Service: Orthopedics;  Laterality: Left;   Patient Active Problem List   Diagnosis Date Noted   B12 deficiency 09/03/2023   Onychomycosis 05/10/2023   Hypercalciuria 02/02/2023   DDD (degenerative disc disease), cervical 04/13/2021   Osteoporosis without current pathological fracture 10/28/2020   Nonallopathic lesion of sacral region 02/26/2020   Hot flashes 02/23/2019   Other insomnia 02/23/2019   Cystic acne vulgaris 02/23/2019   Bunion of right foot 02/23/2019   Varicose 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 02/23/2019   Multiple atypical nevi 02/23/2019   Mitral valve prolapse 03/03/2009    PCP: Roxan [name]   REFERRING PROVIDER: JONETTA Franky Supple (shoulder) Arthea Sharps, DO (hip)  REFERRING DIAG:  Diagnosis  [phone] (ICD-10-CM) - Presence of left artificial shoulder joint  Pain right hip  THERAPY DIAG:  Acute pain of left shoulder  Pain in right hip  Rationale for Evaluation and Treatment: Rehabilitation  ONSET DATE:  DOS: 02/07/2023  SUBJECTIVE:  SUBJECTIVE STATEMENT: Immediately when I sit in the car the pain comes back and standing is so painful.   From Initial Evaluation: Patient had a left standard total shoulder replacement last year.  She did well for about 6 months then began having progressive pain in the shoulder.  She had to have a shoulder revision with a reverse total shoulder.  At this time her pain is well-controlled.  She is also had a right shoulder replaced.  When she had that replaced she had significant pain.  She reports this 1 is better.  She is wearing her sling outside the house and when sleeping.  She is otherwise has been wearing her sling.  She has  increased pain when she moves her arm.  She has been compliant with her HEP so far. Hand dominance: Right  PERTINENT HISTORY: Multijoint arthritis including knees and hips.  Osteoporosis, right shoulder replacement, mitral valve prolapse  PAIN:  Are you having pain? Yes: NPRS scale: 0/10  Pain location: hips to neck Pain description: sore Aggravating factors: Use of the arm Relieving factors: Not using the arm is much  PRECAUTIONS: Shoulder  RED FLAGS: None   WEIGHT BEARING RESTRICTIONS: No  FALLS:  Has patient fallen in last 6 months? No  LIVING ENVIRONMENT:   OCCUPATION: Nurse   PLOF: Independent  PATIENT GOALS:  Patient wants to be functional   OBJECTIVE:  Note: Objective measures were completed at Evaluation unless otherwise noted. IMAGING:  MRI Rt hip 8/21 IMPRESSION: 1. Mild right hip osteoarthritis. 2. Bilateral hamstring and gluteal tendinosis. 3. Mild peritrochanteric bursitis bilaterally. 4. Small volume free fluid within the pelvis, nonspecific.  MRI lumbar 8/12 IMPRESSION: 1. Mild canal stenosis at L3-L4. 2. Moderate left and mild right foraminal stenosis at L5-S1 secondary to disc bulging and facet arthropathy.   PATIENT SURVEYS :  UEFI 40/80   UPPER EXTREMITY ROM:   Passive ROM Right eval Left eval Left 1/21 L  L  Shoulder flexion  85 106 130AROM 160PROM  148AROM supine 158PROM  Shoulder extension       Shoulder abduction     112 active supine  Shoulder adduction       Shoulder internal rotation  Can sit on body comfortably   L gute  65/L iliac crest but can get to L4 with some compensation  Shoulder external rotation  20 26  50/T3  Elbow flexion       Elbow extension       Wrist flexion       Wrist extension       Wrist ulnar deviation       Wrist radial deviation       Wrist pronation       Wrist supination       (Blank rows = not tested)  UPPER EXTREMITY MMT:  MMT Right 3/25 Left 3/25 Lt 4/22 Rt/Lt 9/25  Shoulder  flexion 15.4 8.2 9.5 24.4/16.6  Shoulder extension      Shoulder abduction 9.9 6.8 7.3 20.6/16.7  Shoulder adduction      Shoulder internal rotation 11.3 8.8 6.4 15.4/11.9  Shoulder external rotation 11.5 8.1 7.8 15.3/12.5  (Blank rows = not tested)    8/21- did not take strength tests for LEs due to high levels of pain  MMT Right 9/25 Left 9/25  Hip flexion 31.8 29.6  Hip extension    Hip abduction 36.5 36.9  Hip adduction    Hip internal rotation    Hip external rotation  Knee flexion 25.5 26.9  Knee extension 48.1 47.1   (Blank rows = not tested)    PALPATION:  No unexpected TTP    LUMBAR SCREEN 7/3:  Negative change in standing extension, increased pain in flexion  Negative for change in prone on elbows static or dynamic  Negative for change with lumbar mobilizations  Negative SLR passive, positive for concordant pain in active SLR  Tightness in hamstrings & lateral hip  Negative concordant pain in resisted hamstring MMT or hip ext MMT  Negative concordant pain in sidelying hip abd (but is weak), proximal HS pain with sidelying hip abd+hip flexion  Negative cocordant pain with supine march bent knee, positive concordant pain active SLR on Rt (no change with movement of left leg)  Decreased concordant pain for a short amount of time following IASTM to hamstrings & STM to glut med/min                                                                                                                            TREATMENT DATE:  12/16/23 Ultrasound- 8 min 3.3 MHz, 1.0 w/cm2- Rt sacral border Prone hip ext knee flexed- trunk on table, legs off Standing postural alignment- suggested laser level use at home  12/10/23 IASTM Rt iliac crest and Rt sacral border Prone Rt sacrum PA mobs Supine eccentric march in neutral & turnout Squat on toes, sponge under heels  Static stance on bosu with mirror for VC  Treatment                            10/13: Blank lines following  charge title = not provided on this treatment date.   Manual:  TPDN YES Trigger Point Dry Needling  Subsequent Treatment: Instructions provided previously at initial dry needling treatment.   Patient Verbal Consent Given: Yes Education Handout Provided: Previously Provided Muscles Treated: Rt glut max & med Electrical Stimulation Performed: No Treatment Response/Outcome: decreased concordant spasm STM and bending of glut fibers Seated Ant/post tilt with PA mob of right upper sacrum in ant tilt   Qped hip hike ball bw legs Qped hip ext- ball behind knee, extended knee resulted in anterior shift of femoral head Seated ab set to press ball back into wall Qped rocking with anterior tilt and AP pressure to upper right quadrant of sacrum Standing, slight hip hinge, glut set   PATIENT EDUCATION: Education details:  HEP, exercise form, rationale of interventions, protocol restrictions and limitations, protocol ranges, and ROM findings Person educated: Patient Education method: Explanation, Demonstration, Tactile cues, Verbal cues Education comprehension: verbalized understanding, returned demonstration, verbal cues required, tactile cues required, and needs further education  HOME EXERCISE PROGRAM: Access Code: [email or web address] URL: [email or web address]   ASSESSMENT:  CLINICAL IMPRESSION: Trial of ultrasound today to decrease tendon insertion at sacral border. Pt is doing well with alignment in standing. Continues to improve in sessions but pain returns upon sitting in car.  OBJECTIVE IMPAIRMENTS:  decreased ROM, decreased strength, impaired UE functional use, and pain.   ACTIVITY LIMITATIONS: carrying, lifting, sleeping, bathing, toileting, dressing, self feeding, reach over head, and hygiene/grooming  PARTICIPATION LIMITATIONS: meal prep, cleaning, laundry, driving, shopping, community activity, occupation, and yard work  PERSONAL FACTORS: 1-2 comorbidities: previous  replacement on the same shoulder osteoporosis; multi joint OA  are also affecting patient's functional outcome.   REHAB POTENTIAL: Good  CLINICAL DECISION MAKING: Stable/uncomplicated  EVALUATION COMPLEXITY: Low  GOALS: Goals reviewed with patient? Yes  SHORT TERM GOALS: Target date: 04/16/2023    Patient will increase passive flexion to 115 degrees  Baseline: Goal status: MET  2.  Patient will increase left shoulder  ER to 30 degrees  Baseline:  Goal status: MET  3.  Patient will use her left arm for ADL's below 90 degrees with > 2/10 pain  Baseline:  Goal status: MET   LONG TERM GOALS: Target date: POC DATE    The patient will reach overhead with left arm without pain in order to perform ADL's  Baseline:  Goal status: MET  2.  Patient will each behind her head without pain in order to per from her ADL's  Baseline:  Goal status: MET  3.  Patient will reach behind her back to L3 without pain in order to perform ADL's  Baseline:  Goal status: MET  4. Able to demo overhead activity without scapular winging bilaterally  Status: MET 5. Strength available for ADLs with weight/resistance such as washing dishes, vacuuming   Status: ongoing- has met for shoulder but limited by hip/back/buttock pain 6. Feel prepared to lift grand children when I have them.   Status: New 7. Able to sit for at least 10 minutes without needing to stand due to pain  Baseline: activated it hurts immediately, but if I am not I may be able to make it 5 minutes now!  Status: ongoing 8. Demonstrate proper core activation in exercises for lumbopelvic support  Status: New   PLAN: PT FREQUENCY: 2x/week  PT DURATION: 8 weeks  PLANNED INTERVENTIONS: 97110-Therapeutic exercises, 97530- Therapeutic activity, [id number]- Neuromuscular re-education, 97535- Self Care, 02859- Manual therapy, 7113- Aquatic Therapy, 97014- Electrical stimulation (unattended), 97035- Ultrasound, Patient/Family education,  Taping, Dry Needling, DME instructions, Cryotherapy, and Moist heat   PLAN FOR NEXT SESSION:    Review core contraction.    Quandra Fedorchak C. Amaya Blakeman PT, DPT 12/17/23 9:21 AM

## 2023-12-17 ENCOUNTER — Other Ambulatory Visit: Payer: Self-pay

## 2023-12-20 ENCOUNTER — Other Ambulatory Visit: Payer: Self-pay

## 2023-12-20 ENCOUNTER — Other Ambulatory Visit: Payer: Self-pay | Admitting: Pharmacist

## 2023-12-20 ENCOUNTER — Ambulatory Visit: Attending: Family | Admitting: Pharmacist

## 2023-12-20 DIAGNOSIS — M810 Age-related osteoporosis without current pathological fracture: Secondary | ICD-10-CM

## 2023-12-20 DIAGNOSIS — Z7189 Other specified counseling: Secondary | ICD-10-CM

## 2023-12-20 MED ORDER — DENOSUMAB 60 MG/ML ~~LOC~~ SOSY
60.0000 mg | PREFILLED_SYRINGE | SUBCUTANEOUS | 0 refills | Status: AC
Start: 2023-12-20 — End: ?
  Filled 2023-12-20: qty 1, 30d supply, fill #0
  Filled 2023-12-20: qty 1, 180d supply, fill #0

## 2023-12-20 NOTE — Progress Notes (Signed)
 Specialty Pharmacy Initial Fill Coordination Note  Tara [name] is a 63 y.o. female contacted today regarding initial fill of specialty medication(s) Denosumab  (PROLIA )   Patient requested Courier to Provider Office   Delivery date: 01/06/24   Verified address: [facility]   Medication will be filled on: 01/03/24   Patient is aware of $0 copayment.

## 2023-12-20 NOTE — Progress Notes (Signed)
 See OV for additional documentation.   Herlene Fleeta Morris, PharmD, JAQUELINE, CPP Clinical Pharmacist [facility] & [facility] [phone]

## 2023-12-20 NOTE — Progress Notes (Signed)
 S:  Patient presents for review of their specialty medication therapy.  Patient is currently taking Prolia  for osteoporosis. Patient is managed by Dr. Lenis for this.   Adherence: confirms. Has been on for almost 2 years now  Efficacy: per patient, last BMD scan showed improvement.  Dosing: 60 mg subcutaneously once every 6 months  Dose adjustments: Renal: Monitor patients with severe impairment (CrCl <30 mL/minute or on dialysis) closely, as significant and prolonged hypocalcemia (incidence of 29% and potentially lasting weeks to months) and marked elevations of serum parathyroid hormone are serious risks in this population. Ensure adequate calcium  and vitamin D  intake/supplementation. CrCl >=30 mL/minute: No dosage adjustment necessary. CrCl <30 mL/minute: No dosage adjustment necessary; use in conjunction with guidance from patient's nephrology team. Hepatic: no dose adjustments (has not been studied)  Drug-drug interactions: none identified  Monitoring: S/sx of infection: none  S/sx of hypersensitivity: none S/sx of hypocalcemia/hypercalcemia: none, last level from 09/03/23 normal Dermatitis/skin rash: none Peripheral edema: none HA: none GI upset: none   Other side effects: none reported    O:      Lab Results  Component Value Date   WBC 5.4 09/03/2023   HGB 14.0 09/03/2023   HCT 41.3 09/03/2023   MCV 97.9 09/03/2023   PLT 178.0 09/03/2023      Chemistry      Component Value Date/Time   NA 131 (L) 09/03/2023 1046   NA 133 (A) 04/22/2018 0000   K 4.2 09/03/2023 1046   CL 97 09/03/2023 1046   CO2 30 09/03/2023 1046   BUN 19 09/03/2023 1046   BUN 25 (A) 04/22/2018 0000   CREATININE 0.62 09/03/2023 1046   CREATININE 0.50 05/16/2023 0953   GLU 85 04/22/2018 0000      Component Value Date/Time   CALCIUM  CANCELED 09/03/2023 1046   CALCIUM  9.1 09/03/2023 1046   ALKPHOS 37 (L) 09/03/2023 1046   AST 31 09/03/2023 1046   ALT 19 09/03/2023 1046   BILITOT 0.5  09/03/2023 1046       A/P: 1. Medication review: Patient currently on Prolia  for osteoporosis. Reviewed the medication with the patient, including the following: Prolia  (denosumab ) is a monoclonal antibody with affinity for nuclear factor-kappa ligand (RANKL). Prolia  binds to RANKL and prevents osteoclast formation, leading to decreased bone resorption and increased bone mass in osteoporosis. Patient educated on purpose, proper use, and potential adverse effects of Prolia . The most common adverse effects are hypersensitivities, peripheral edema, dermatitis/skin rash, GI upset, HA, joint pain, and infection. There is the possibility of atypical femur fracture, serum calcium  disturbances, and osteonecrosis of the jaw. Patients should monitor for and report hip, thigh, or groin pain. Additionally, patients should monitor for and report jaw pain, tooth/periodontal infection, toothache, and/or gingival ulceration/erosion. Prolia  exists as a solution prefilled syringe for SQ administration. Administration: Denosumab  is intended for SubQ route only and should not be administered IV, IM, or intradermally. Prior to administration, bring to room temperature in original container (allow to stand ~15 to 30 minutes); do not warm by any other method. Solution may contain trace amounts of translucent to white protein particles; do not use if cloudy, discolored (normal solution should be clear and colorless to pale yellow), or contains excessive particles or foreign matter. Avoid vigorous shaking. Administer via SubQ injection in the upper arm, upper thigh, or abdomen; should only be administered by a health care professional. No recommendations for any changes at this time.  Herlene Fleeta Morris, PharmD, BCACP, CPP Clinical Pharmacist [facility] [phone]

## 2023-12-23 NOTE — Therapy (Signed)
 OUTPATIENT PHYSICAL THERAPY UPPER EXTREMITY TREATMENT   Patient Name: Tara [name] MRN: [id number] DOB:11-Apr-1960, 63 y.o., female Today's Date: 12/24/2023  END OF SESSION:  PT End of Session - 12/24/23 1149     Visit Number 44    Number of Visits 51    Date for Recertification  02/06/24    PT Start Time 1149    PT Stop Time 1230    PT Time Calculation (min) 41 min    Activity Tolerance Patient tolerated treatment well    Behavior During Therapy [facility] for tasks assessed/performed                            Past Medical History:  Diagnosis Date   Allergy    Anemia    Arthritis    Hyperlipidemia    Insomnia    Mitral valve prolapse    PONV (postoperative nausea and vomiting)    Past Surgical History:  Procedure Laterality Date   AUGMENTATION MAMMAPLASTY Bilateral    Dec 2019   BREAST BIOPSY Right    axillary node benign   EYE SURGERY  2010   Lasik for distance correction; use one contact in left eye for reading now   JOINT REPLACEMENT  2021; 2022; 2025   reverse shoulder on the right; anatomic replacement on the left then conversion to reverse replacement due to failed anatomic   KNEE SURGERY  1988   right   SHOULDER ARTHROSCOPY WITH LABRAL REPAIR Right 08/28/2012   Procedure: RIGHT SHOULDER ARTHROSCOPY WITH LABRAL DEBRIDEMENT, ASPIRATION AND DECOMPRESSION OF PARALABRAL CYST;  Surgeon: Franky CHRISTELLA Pointer, MD;  Location: [facility] OR;  Service: Orthopedics;  Laterality: Right;   TOTAL SHOULDER ARTHROPLASTY Right 03/24/2020   Procedure: RIGHT REVERSE SHOULDER ARTHROPLASTY;  Surgeon: Pointer Franky, MD;  Location: [facility];  Service: Orthopedics;  Laterality: Right;    TOTAL SHOULDER ARTHROPLASTY Left 02/23/2021   Procedure: TOTAL SHOULDER ARTHROPLASTY;  Surgeon: Pointer Franky, MD;  Location: [facility];  Service: Orthopedics;  Laterality: Left;   TOTAL SHOULDER REVISION Left 02/07/2023   Procedure: Excision Left shoulder nodule; Conversion of left anatomic  shoulder arthroplasty to reverse shoulder arthroplasty;  Surgeon: Pointer Franky, MD;  Location: [facility];  Service: Orthopedics;  Laterality: Left;   Patient Active Problem List   Diagnosis Date Noted   B12 deficiency 09/03/2023   Onychomycosis 05/10/2023   Hypercalciuria 02/02/2023   DDD (degenerative disc disease), cervical 04/13/2021   Osteoporosis without current pathological fracture 10/28/2020   Nonallopathic lesion of sacral region 02/26/2020   Hot flashes 02/23/2019   Other insomnia 02/23/2019   Cystic acne vulgaris 02/23/2019   Bunion of right foot 02/23/2019   Varicose 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 02/23/2019   Multiple atypical nevi 02/23/2019   Mitral valve prolapse 03/03/2009    PCP: Roxan [name]   REFERRING PROVIDER: JONETTA Franky Supple (shoulder) Arthea Sharps, DO (hip)  REFERRING DIAG:  Diagnosis  [phone] (ICD-10-CM) - Presence of left artificial shoulder joint  Pain right hip  THERAPY DIAG:  Acute pain of left shoulder  Pain in right hip  Stiffness of left shoulder, not elsewhere classified  Rationale for Evaluation and Treatment: Rehabilitation  ONSET DATE:  DOS: 02/07/2023  SUBJECTIVE:  SUBJECTIVE STATEMENT: The stance is so hard to achieve  From Initial Evaluation: Patient had a left standard total shoulder replacement last year.  She did well for about 6 months then began having progressive pain in the shoulder.  She had to have a shoulder revision with a reverse total shoulder.  At this time her pain is well-controlled.  She is also had a right shoulder replaced.  When she had that replaced she had significant pain.  She reports this 1 is better.  She is wearing her sling outside the house and when sleeping.  She is otherwise has been wearing her sling.  She  has increased pain when she moves her arm.  She has been compliant with her HEP so far. Hand dominance: Right  PERTINENT HISTORY: Multijoint arthritis including knees and hips.  Osteoporosis, right shoulder replacement, mitral valve prolapse  PAIN:  Are you having pain? Yes: NPRS scale: 0/10  Pain location: hips to neck Pain description: sore Aggravating factors: Use of the arm Relieving factors: Not using the arm is much  PRECAUTIONS: Shoulder  RED FLAGS: None   WEIGHT BEARING RESTRICTIONS: No  FALLS:  Has patient fallen in last 6 months? No  LIVING ENVIRONMENT:   OCCUPATION: Nurse   PLOF: Independent  PATIENT GOALS:  Patient wants to be functional   OBJECTIVE:  Note: Objective measures were completed at Evaluation unless otherwise noted. IMAGING:  MRI Rt hip 8/21 IMPRESSION: 1. Mild right hip osteoarthritis. 2. Bilateral hamstring and gluteal tendinosis. 3. Mild peritrochanteric bursitis bilaterally. 4. Small volume free fluid within the pelvis, nonspecific.  MRI lumbar 8/12 IMPRESSION: 1. Mild canal stenosis at L3-L4. 2. Moderate left and mild right foraminal stenosis at L5-S1 secondary to disc bulging and facet arthropathy.   PATIENT SURVEYS :  UEFI 40/80   UPPER EXTREMITY ROM:   Passive ROM Right eval Left eval Left 1/21 L  L  Shoulder flexion  85 106 130AROM 160PROM  148AROM supine 158PROM  Shoulder extension       Shoulder abduction     112 active supine  Shoulder adduction       Shoulder internal rotation  Can sit on body comfortably   L gute  65/L iliac crest but can get to L4 with some compensation  Shoulder external rotation  20 26  50/T3  Elbow flexion       Elbow extension       Wrist flexion       Wrist extension       Wrist ulnar deviation       Wrist radial deviation       Wrist pronation       Wrist supination       (Blank rows = not tested)  UPPER EXTREMITY MMT:  MMT Right 3/25 Left 3/25 Lt 4/22 Rt/Lt 9/25   Shoulder flexion 15.4 8.2 9.5 24.4/16.6  Shoulder extension      Shoulder abduction 9.9 6.8 7.3 20.6/16.7  Shoulder adduction      Shoulder internal rotation 11.3 8.8 6.4 15.4/11.9  Shoulder external rotation 11.5 8.1 7.8 15.3/12.5  (Blank rows = not tested)    8/21- did not take strength tests for LEs due to high levels of pain  MMT Right 9/25 Left 9/25  Hip flexion 31.8 29.6  Hip extension    Hip abduction 36.5 36.9  Hip adduction    Hip internal rotation    Hip external rotation    Knee flexion 25.5 26.9  Knee extension 48.1  47.1   (Blank rows = not tested)    PALPATION:  No unexpected TTP    LUMBAR SCREEN 7/3:  Negative change in standing extension, increased pain in flexion  Negative for change in prone on elbows static or dynamic  Negative for change with lumbar mobilizations  Negative SLR passive, positive for concordant pain in active SLR  Tightness in hamstrings & lateral hip  Negative concordant pain in resisted hamstring MMT or hip ext MMT  Negative concordant pain in sidelying hip abd (but is weak), proximal HS pain with sidelying hip abd+hip flexion  Negative cocordant pain with supine march bent knee, positive concordant pain active SLR on Rt (no change with movement of left leg)  Decreased concordant pain for a short amount of time following IASTM to hamstrings & STM to glut med/min                                                                                                                            TREATMENT DATE:  12/24/23 Ultrasound- 8 min 3.3 MHz, 1.0 w/cm2- Rt sacral border & Rt ischial tuberosity Able to do child pose and forward hinge in standing without onset of pain following US  today.  Review of resting posture- pelvis stacked and TA engaged  Added UE ER, forward reach/serve, overhead reach  12/16/23 Ultrasound- 8 min 3.3 MHz, 1.0 w/cm2- Rt sacral border Prone hip ext knee flexed- trunk on table, legs off Standing postural alignment-  suggested laser level use at home  12/10/23 IASTM Rt iliac crest and Rt sacral border Prone Rt sacrum PA mobs Supine eccentric march in neutral & turnout Squat on toes, sponge under heels  Static stance on bosu with mirror for VC     PATIENT EDUCATION: Education details:  HEP, exercise form, rationale of interventions, protocol restrictions and limitations, protocol ranges, and ROM findings Person educated: Patient Education method: Explanation, Demonstration, Tactile cues, Verbal cues Education comprehension: verbalized understanding, returned demonstration, verbal cues required, tactile cues required, and needs further education  HOME EXERCISE PROGRAM: Access Code: [email or web address] URL: [email or web address]   ASSESSMENT:  CLINICAL IMPRESSION: Pt is able to demo stacked posture at rest, notable post lean through trunk to compensate when UEs are bringing weight forward. Was able to perform flexion in standing without increased pain following US  today- will f/u with this to determine if we need to continue with US  treatment.    OBJECTIVE IMPAIRMENTS:  decreased ROM, decreased strength, impaired UE functional use, and pain.   ACTIVITY LIMITATIONS: carrying, lifting, sleeping, bathing, toileting, dressing, self feeding, reach over head, and hygiene/grooming  PARTICIPATION LIMITATIONS: meal prep, cleaning, laundry, driving, shopping, community activity, occupation, and yard work  PERSONAL FACTORS: 1-2 comorbidities: previous replacement on the same shoulder osteoporosis; multi joint OA  are also affecting patient's functional outcome.   REHAB POTENTIAL: Good  CLINICAL DECISION MAKING: Stable/uncomplicated  EVALUATION COMPLEXITY: Low  GOALS: Goals reviewed with patient? Yes  SHORT TERM GOALS: Target date: 04/16/2023  Patient will increase passive flexion to 115 degrees  Baseline: Goal status: MET  2.  Patient will increase left shoulder  ER to 30 degrees   Baseline:  Goal status: MET  3.  Patient will use her left arm for ADL's below 90 degrees with > 2/10 pain  Baseline:  Goal status: MET   LONG TERM GOALS: Target date: POC DATE    The patient will reach overhead with left arm without pain in order to perform ADL's  Baseline:  Goal status: MET  2.  Patient will each behind her head without pain in order to per from her ADL's  Baseline:  Goal status: MET  3.  Patient will reach behind her back to L3 without pain in order to perform ADL's  Baseline:  Goal status: MET  4. Able to demo overhead activity without scapular winging bilaterally  Status: MET 5. Strength available for ADLs with weight/resistance such as washing dishes, vacuuming   Status: ongoing- has met for shoulder but limited by hip/back/buttock pain 6. Feel prepared to lift grand children when I have them.   Status: New 7. Able to sit for at least 10 minutes without needing to stand due to pain  Baseline: activated it hurts immediately, but if I am not I may be able to make it 5 minutes now!  Status: ongoing 8. Demonstrate proper core activation in exercises for lumbopelvic support  Status: New   PLAN: PT FREQUENCY: 2x/week  PT DURATION: 8 weeks  PLANNED INTERVENTIONS: 97110-Therapeutic exercises, 97530- Therapeutic activity, [id number]- Neuromuscular re-education, 97535- Self Care, [id number]- Manual therapy, 7113- Aquatic Therapy, [id number]- Electrical stimulation (unattended), 97035- Ultrasound, Patient/Family education, Taping, Dry Needling, DME instructions, Cryotherapy, and Moist heat   PLAN FOR NEXT SESSION:    Review core contraction.    Blakeleigh Domek C. Hien Cunliffe PT, DPT 12/24/23 1:45 PM

## 2023-12-24 ENCOUNTER — Encounter (HOSPITAL_BASED_OUTPATIENT_CLINIC_OR_DEPARTMENT_OTHER): Payer: Self-pay | Admitting: Physical Therapy

## 2023-12-24 ENCOUNTER — Ambulatory Visit (HOSPITAL_BASED_OUTPATIENT_CLINIC_OR_DEPARTMENT_OTHER): Attending: Orthopedic Surgery | Admitting: Physical Therapy

## 2023-12-24 DIAGNOSIS — M25512 Pain in left shoulder: Secondary | ICD-10-CM | POA: Insufficient documentation

## 2023-12-24 DIAGNOSIS — M25551 Pain in right hip: Secondary | ICD-10-CM | POA: Insufficient documentation

## 2023-12-24 DIAGNOSIS — M25612 Stiffness of left shoulder, not elsewhere classified: Secondary | ICD-10-CM | POA: Insufficient documentation

## 2023-12-25 NOTE — Telephone Encounter (Signed)
 Prolia  VOB initiated via [email or web address]  Next Prolia  inj DUE: 01/06/24

## 2023-12-30 NOTE — Telephone Encounter (Signed)
 [employer] - Prior Authorization REQUIRED for [employer]

## 2023-12-31 ENCOUNTER — Ambulatory Visit (HOSPITAL_BASED_OUTPATIENT_CLINIC_OR_DEPARTMENT_OTHER): Admitting: Physical Therapy

## 2023-12-31 ENCOUNTER — Other Ambulatory Visit (HOSPITAL_COMMUNITY): Payer: Self-pay

## 2023-12-31 ENCOUNTER — Encounter (HOSPITAL_COMMUNITY): Payer: Self-pay

## 2023-12-31 ENCOUNTER — Encounter (HOSPITAL_BASED_OUTPATIENT_CLINIC_OR_DEPARTMENT_OTHER): Payer: Self-pay | Admitting: Physical Therapy

## 2023-12-31 DIAGNOSIS — M25551 Pain in right hip: Secondary | ICD-10-CM

## 2023-12-31 DIAGNOSIS — M25612 Stiffness of left shoulder, not elsewhere classified: Secondary | ICD-10-CM

## 2023-12-31 DIAGNOSIS — M25512 Pain in left shoulder: Secondary | ICD-10-CM

## 2023-12-31 NOTE — Therapy (Signed)
 Pt arrived for her appt but decided to return when her primary PT Barista) is back in clinic, she would really prefer to work with just one provider. No charge visit for today.  Josette Rough, PT, DPT 12/31/23 11:08 AM

## 2024-01-01 ENCOUNTER — Ambulatory Visit: Payer: 59 | Admitting: Internal Medicine

## 2024-01-01 ENCOUNTER — Telehealth: Payer: Self-pay | Admitting: "Endocrinology

## 2024-01-01 NOTE — Telephone Encounter (Signed)
 Since she will be getting her prolia  at her first appt, does she need bloodwork ahead of time?

## 2024-01-01 NOTE — Telephone Encounter (Signed)
 Joy can you call pt and let her know, she is scheduled for next week for her NP appt and Prolia .

## 2024-01-02 NOTE — Telephone Encounter (Signed)
 Left a message requesting pt return call to the office.

## 2024-01-02 NOTE — Telephone Encounter (Signed)
 Pt has transferred care to Dr. Lenis in [place].

## 2024-01-03 ENCOUNTER — Other Ambulatory Visit: Payer: Self-pay

## 2024-01-03 NOTE — Telephone Encounter (Signed)
 Left a message requesting pt return call to the office.

## 2024-01-06 ENCOUNTER — Encounter: Payer: Self-pay | Admitting: Cardiovascular Disease

## 2024-01-06 ENCOUNTER — Other Ambulatory Visit: Payer: Self-pay | Admitting: Family

## 2024-01-06 ENCOUNTER — Other Ambulatory Visit (INDEPENDENT_AMBULATORY_CARE_PROVIDER_SITE_OTHER)

## 2024-01-06 ENCOUNTER — Ambulatory Visit: Admitting: "Endocrinology

## 2024-01-06 ENCOUNTER — Other Ambulatory Visit: Payer: Self-pay | Admitting: Family Medicine

## 2024-01-06 ENCOUNTER — Ambulatory Visit: Admitting: Internal Medicine

## 2024-01-06 DIAGNOSIS — M25551 Pain in right hip: Secondary | ICD-10-CM | POA: Diagnosis not present

## 2024-01-06 DIAGNOSIS — M810 Age-related osteoporosis without current pathological fracture: Secondary | ICD-10-CM

## 2024-01-06 DIAGNOSIS — R1011 Right upper quadrant pain: Secondary | ICD-10-CM

## 2024-01-06 DIAGNOSIS — M25552 Pain in left hip: Secondary | ICD-10-CM | POA: Diagnosis not present

## 2024-01-06 LAB — CBC WITH DIFFERENTIAL/PLATELET
Basophils Absolute: 0 10*3/uL (ref 0.0–0.1)
Basophils Relative: 0.8 % (ref 0.0–3.0)
Eosinophils Absolute: 0 10*3/uL (ref 0.0–0.7)
Eosinophils Relative: 0.6 % (ref 0.0–5.0)
HCT: 37.9 % (ref 36.0–46.0)
Hemoglobin: 13.4 g/dL (ref 12.0–15.0)
Lymphocytes Relative: 23.1 % (ref 12.0–46.0)
Lymphs Abs: 1.2 10*3/uL (ref 0.7–4.0)
MCHC: 35.3 g/dL (ref 30.0–36.0)
MCV: 100.2 fl — ABNORMAL HIGH (ref 78.0–100.0)
Monocytes Absolute: 0.5 10*3/uL (ref 0.1–1.0)
Monocytes Relative: 10.6 % (ref 3.0–12.0)
Neutro Abs: 3.3 10*3/uL (ref 1.4–7.7)
Neutrophils Relative %: 64.9 % (ref 43.0–77.0)
Platelets: 182 10*3/uL (ref 150.0–400.0)
RBC: 3.78 Mil/uL — ABNORMAL LOW (ref 3.87–5.11)
RDW: 13.2 % (ref 11.5–15.5)
WBC: 5.1 10*3/uL (ref 4.0–10.5)

## 2024-01-06 NOTE — Telephone Encounter (Signed)
 Patient called back.  It looks like Dr. Arthea Sharps has placed lab orders today for patient to have labs done that are needed.   Patient is going to call his office to Confirm.

## 2024-01-07 ENCOUNTER — Ambulatory Visit: Payer: Self-pay | Admitting: Family Medicine

## 2024-01-07 ENCOUNTER — Ambulatory Visit (HOSPITAL_BASED_OUTPATIENT_CLINIC_OR_DEPARTMENT_OTHER): Admitting: Physical Therapy

## 2024-01-07 LAB — COMPREHENSIVE METABOLIC PANEL WITH GFR
ALT: 23 U/L (ref 0–35)
AST: 32 U/L (ref 0–37)
Albumin: 4.5 g/dL (ref 3.5–5.2)
Alkaline Phosphatase: 35 U/L — ABNORMAL LOW (ref 39–117)
BUN: 12 mg/dL (ref 6–23)
CO2: 24 meq/L (ref 19–32)
Calcium: 9 mg/dL (ref 8.4–10.5)
Chloride: 98 meq/L (ref 96–112)
Creatinine, Ser: 0.55 mg/dL (ref 0.40–1.20)
GFR: 98.26 mL/min (ref >60.00)
Glucose, Bld: 77 mg/dL (ref 70–99)
Potassium: 4.3 meq/L (ref 3.5–5.1)
Sodium: 131 meq/L — ABNORMAL LOW (ref 135–145)
Total Bilirubin: 0.7 mg/dL (ref 0.2–1.2)
Total Protein: 6.5 g/dL (ref 6.0–8.3)

## 2024-01-07 LAB — VITAMIN D 25 HYDROXY (VIT D DEFICIENCY, FRACTURES): VITD: 42.98 ng/mL (ref 30.00–100.00)

## 2024-01-07 LAB — BASIC METABOLIC PANEL WITH GFR
BUN: 12 mg/dL (ref 6–23)
CO2: 24 meq/L (ref 19–32)
Calcium: 9 mg/dL (ref 8.4–10.5)
Chloride: 98 meq/L (ref 96–112)
Creatinine, Ser: 0.55 mg/dL (ref 0.40–1.20)
GFR: 98.26 mL/min (ref >60.00)
Glucose, Bld: 77 mg/dL (ref 70–99)
Potassium: 4.3 meq/L (ref 3.5–5.1)
Sodium: 131 meq/L — ABNORMAL LOW (ref 135–145)

## 2024-01-07 LAB — LIPASE: Lipase: 32 U/L (ref 11.0–59.0)

## 2024-01-08 ENCOUNTER — Other Ambulatory Visit: Payer: Self-pay

## 2024-01-08 ENCOUNTER — Ambulatory Visit: Admitting: "Endocrinology

## 2024-01-08 NOTE — Progress Notes (Signed)
 Patient contacted [facility] stating that she thought she would be getting Prolia  but that office informed her they have received Jubbonti. Pharmacy dispensed Prolia  and sent to office on 11/17. Confirmed with LPN Zada Sharps at provider office that they do in fact have Prolia  prescription. Spoke to patient to inform.

## 2024-01-09 ENCOUNTER — Encounter: Payer: Self-pay | Admitting: "Endocrinology

## 2024-01-09 ENCOUNTER — Ambulatory Visit (INDEPENDENT_AMBULATORY_CARE_PROVIDER_SITE_OTHER): Admitting: "Endocrinology

## 2024-01-09 ENCOUNTER — Other Ambulatory Visit: Payer: Self-pay

## 2024-01-09 ENCOUNTER — Other Ambulatory Visit (HOSPITAL_COMMUNITY): Payer: Self-pay

## 2024-01-09 VITALS — BP 105/70 | HR 64 | Ht 70.0 in | Wt 128.0 lb

## 2024-01-09 DIAGNOSIS — M818 Other osteoporosis without current pathological fracture: Secondary | ICD-10-CM

## 2024-01-09 DIAGNOSIS — R82994 Hypercalciuria: Secondary | ICD-10-CM | POA: Diagnosis not present

## 2024-01-09 LAB — POCT GLYCOSYLATED HEMOGLOBIN (HGB A1C): Hemoglobin A1C: 5.2 % (ref 4.0–5.6)

## 2024-01-09 MED ORDER — CHLORTHALIDONE 12.5 MG PO TABS
12.5000 mg | ORAL_TABLET | Freq: Every day | ORAL | 1 refills | Status: AC
  Filled 2024-01-09: qty 90, 90d supply, fill #0

## 2024-01-09 MED ORDER — DENOSUMAB 60 MG/ML ~~LOC~~ SOSY
60.0000 mg | PREFILLED_SYRINGE | SUBCUTANEOUS | Status: AC
  Administered 2024-01-09: 60 mg via SUBCUTANEOUS

## 2024-01-09 NOTE — Progress Notes (Signed)
 Endocrinology Consult Note                                            01/09/2024, 4:27 PM   Subjective:    Patient ID: Tara [name], female    DOB: 1960/04/19, PCP [name], Roxan BROCKS, NP   Past Medical History:  Diagnosis Date   Allergy    Anemia    Arthritis    Hyperlipidemia    Insomnia    Mitral valve prolapse    PONV (postoperative nausea and vomiting)    Past Surgical History:  Procedure Laterality Date   AUGMENTATION MAMMAPLASTY Bilateral    Dec 2019   BREAST BIOPSY Right    axillary node benign   EYE SURGERY  2010   Lasik for distance correction; use one contact in left eye for reading now   JOINT REPLACEMENT  2021; 2022; 2025   reverse shoulder on the right; anatomic replacement on the left then conversion to reverse replacement due to failed anatomic   KNEE SURGERY  1988   right   SHOULDER ARTHROSCOPY WITH LABRAL REPAIR Right 08/28/2012   Procedure: RIGHT SHOULDER ARTHROSCOPY WITH LABRAL DEBRIDEMENT, ASPIRATION AND DECOMPRESSION OF PARALABRAL CYST;  Surgeon: Franky CHRISTELLA Pointer, MD;  Location: [facility];  Service: Orthopedics;  Laterality: Right;   TOTAL SHOULDER ARTHROPLASTY Right 03/24/2020   Procedure: RIGHT REVERSE SHOULDER ARTHROPLASTY;  Surgeon: Pointer Franky, MD;  Location: [facility];  Service: Orthopedics;  Laterality: Right;    TOTAL SHOULDER ARTHROPLASTY Left 02/23/2021   Procedure: TOTAL SHOULDER ARTHROPLASTY;  Surgeon: Pointer Franky, MD;  Location: [facility];  Service: Orthopedics;  Laterality: Left;   TOTAL SHOULDER REVISION Left 02/07/2023   Procedure: Excision Left shoulder nodule; Conversion of left anatomic shoulder arthroplasty to reverse shoulder arthroplasty;  Surgeon: Pointer Franky, MD;  Location: [facility];  Service: Orthopedics;  Laterality: Left;   Social History   Socioeconomic History   Marital status: Married    Spouse name: Not on file   Number of children: 1   Years of education: Not on file   Highest education level: Doctorate   Occupational History   Occupation: [employer]  Tobacco Use   Smoking status: Never    Passive exposure: Never   Smokeless tobacco: Never  Vaping Use   Vaping status: Never Used  Substance and Sexual Activity   Alcohol use: No   Drug use: No   Sexual activity: Yes    Birth control/protection: Post-menopausal  Other Topics Concern   Not on file  Social History Narrative   Tobacco use, amount per day now: 0   Past tobacco use, amount per day: 0   How many years did you use tobacco: 0   Alcohol use (drinks per week): 0   Diet: Heart Healthy/DASH Diet/ Lean and Clean type foods.   Do you drink/eat things with caffeine: 1 glass of Tea in the morning.   Marital status: Married                                 What year were you married? 1987   Do you live in a house, apartment, assisted living, condo, trailer, etc.? House   Is it one or more stories? Yes   How many persons live in your home? 2 (  Husband/ Self )   Do you have pets in your home?( please list) Not currently, Dog Deceased.    Current or past profession: PhD/RN   Do you exercise?    Yes                              Type and how often? 6 Days a week.   Do you have a living will? Yes   Do you have a DNR form?   No                               If not, do you want to discuss one? Yes   Do you have signed POA/HPOA forms? Yes                       If so, please bring to you appointment   Social Drivers of Health   Financial Resource Strain: Low Risk  (05/15/2023)   Overall Financial Resource Strain (CARDIA)    Difficulty of Paying Living Expenses: Not hard at all  Food Insecurity: No Food Insecurity (05/15/2023)   Hunger Vital Sign    Worried About Running Out of Food in the Last Year: Never true    Ran Out of Food in the Last Year: Never true  Transportation Needs: No Transportation Needs (05/15/2023)   PRAPARE - Administrator, Civil Service (Medical): No    Lack of Transportation (Non-Medical): No  Physical Activity:  Sufficiently Active (05/15/2023)   Exercise Vital Sign    Days of Exercise per Week: 6 days    Minutes of Exercise per Session: 30 min  Stress: Stress Concern Present (05/15/2023)   [employer] of Occupational Health - Occupational Stress Questionnaire    Feeling of Stress : To some extent  Social Connections: Socially Integrated (05/15/2023)   Social Connection and Isolation Panel    Frequency of Communication with Friends and Family: More than three times a week    Frequency of Social Gatherings with Friends and Family: Twice a week    Attends Religious Services: More than 4 times per year    Active Member of [employer] or Organizations: Yes    Attends Engineer, Structural: More than 4 times per year    Marital Status: Married   Family History  Problem Relation Age of Onset   Stroke Mother    Heart disease Mother    Hyperlipidemia Mother    Heart disease Father    Diabetes Sister    Heart disease Sister    Hyperlipidemia Sister    Hypertension Sister    Varicose Veins Sister    Diabetes Sister    Hyperlipidemia Sister    Hypertension Sister    Hyperlipidemia Sister    Hypertension Sister    Heart attack Brother 74   Diabetes Brother    Heart disease Brother    Hyperlipidemia Brother    Hypertension Brother    Sudden death Neg Hx    Breast cancer Neg Hx    Colon cancer Neg Hx    Esophageal cancer Neg Hx    Rectal cancer Neg Hx    Stomach cancer Neg Hx    Outpatient Encounter Medications as of 01/09/2024  Medication Sig   CALCIUM  CITRATE PO Take 2,000 mg by mouth daily. Upto 2000 mg elemental calcium  daily   Chlorthalidone 12.5  MG TABS Take 12.5 mg by mouth daily with breakfast.   Cyanocobalamin  (VITAMIN B 12) 500 MCG TABS Take 1,000 mcg by mouth daily with lunch.   Vitamin D -Vitamin K (VITAMIN K2-VITAMIN D3 PO) Take by mouth daily.   AMBULATORY NON FORMULARY MEDICATION Medication Name: Mindful Evening Cocoa Mix (Melatonin 3 mg, Theanine 5 mg, GABA 100 mg)    aspirin  EC 81 MG tablet Take 1 tablet (81 mg total) by mouth daily. Swallow whole.   denosumab  (PROLIA ) 60 MG/ML SOSY injection Inject 60 mg into the skin every 6 (six) months.   Efinaconazole  10 % SOLN Apply 1 drop topically daily.   MAGNESIUM GLYCINATE PO Take 500 mg by mouth daily. Chelated, supplies 50 mg of magnesium   Multiple Vitamin (MULTIVITAMIN WITH MINERALS) TABS Take 1 tablet by mouth daily.   Omega-3 Fatty Acids (FISH OIL ) 1200 MG CAPS Take 1,200 mg by mouth daily. Provides 600 mg Omega-3 fatty acids   Psyllium Husk POWD Take 1 Dose by mouth daily. 1 dose = 1 to 2 tablespoons   rosuvastatin  (CRESTOR ) 10 MG tablet Take 1 tablet (10 mg total) by mouth daily.   tretinoin  (RETIN-A ) 0.05 % cream Apply topically at bedtime. (Patient taking differently: Apply 1 Application topically at bedtime.)   [DISCONTINUED] amoxicillin  (AMOXIL ) 500 MG capsule Take 4 capsules (2,000 mg total) by mouth one hour before dental procedure   [DISCONTINUED] calcium  carbonate (CALCIUM  600) 600 MG TABS tablet Take 600-1,200 mg by mouth daily as needed (calcium  deficiency). Based on diet intake to achieve 1200 mg daily   [DISCONTINUED] Cholecalciferol (VITAMIN D ) 50 MCG (2000 UT) tablet Take 2,000 Units by mouth daily.   Facility-Administered Encounter Medications as of 01/09/2024  Medication   denosumab  (PROLIA ) injection 60 mg   denosumab  (PROLIA ) injection 60 mg   ALLERGIES: Allergies  Allergen Reactions   Codeine Nausea And Vomiting   Hydrocodone Nausea And Vomiting   Triple Antibiotic Pain Relief [Neomy-Bacit-Polymyx-Pramoxine] Other (See Comments)    Burned skin   Triple Antibiotic W/Hydrocortisone  [Bacitra-Neomycin-Polymyxin-Hc]     Burned skin    Contrast Media [Iodinated Contrast Media] Hives    After CT scan with contrast, pt had MRI without contrast. Pt informed MRI Tech that she had a rash on her legs. MRI Tech spoke with Radiologist after MRI was completed. Per Rad, since patient was here x  30 minutes after CT contrast and was doing fine, with rash fading, pt was allowed to leave facility. Alert and oriented. jmh    VACCINATION STATUS: Immunization History  Administered Date(s) Administered   INFLUENZA, HIGH DOSE SEASONAL PF 11/29/2021   Influenza, Seasonal, Injecte, Preservative Fre 11/11/2023   Influenza,inj,quad, With Preservative 12/21/2019   Influenza-Unspecified 09/20/2018, 11/23/2019, 10/20/2020, 11/16/2022   PFIZER(Purple Top)SARS-COV-2 Vaccination 02/26/2019, 03/19/2019, 12/11/2019   Tdap 02/21/2015   Zoster Recombinant(Shingrix) 11/14/2017, 01/20/2018    HPI AVIE CHECO is 63 y.o. female who presents today with a medical history as above. she is being seen in consultation for osteoporosis, hypercalciuria, requested by [name], Roxan BROCKS, NP. History is obtained directly from the patient as well as chart review. Pt was diagnosed with osteoporosis:2021   Menarche at age : 57 Menopausal at age : At approximate age of 53 years  Fracture Hx: no Hx of HRT: no Height loss: Approximately 1 inch, denies any history of fragility fractures. FH of osteoporosis or hip fracture: mother  Prior Hx of anti-estrogenic therapy :no Prior Hx of anti-resorptive therapy : Ongoing treatment with subcutaneous Prolia  every  6 months as of 2022.   Last DEXA scan in March 2025: Evidence of significant improvement.  Patient has persistent hypercalciuria which was largely unexplained so far. She did not try any thiazide diuretics due to concern of hypotension.    -She is currently on calcium  citrate up to 1200 mg daily as well as vitamin D3 2000 units daily.  -She follows low-sodium diet as part of her overall quest for healthy lifestyle. She is generally light build with BMI of 18.37 kilograms per meter squared.  She is following a strictly vegan diet, consuming up to 100 g of protein daily almost exclusively from plants. She has lost approximately 15 pounds over the last 2 to 3  years mainly intentional. Recently, she has noticed increasing edema of bilateral feet, ankles, and lower legs.  Her other medications include Crestor  10 mg p.o. daily, magnesium supplements, vitamin D  80.,  Vitamin K, psyllium husk powder, fish oil , multivitamins, aspirin . On further elicitation, patient reports low energy, fatigue and general feeling of poor health.   Her surgical history includes total reverse shoulder replacement on the right in January 2021, followed anatomy from the replacement in the left January 2022, total reverse shoulder replacement on the left January 2025.  She reports bulging disks, spinal stenosis, vitreous detachment, mild enlargement of left atrium with questionable mitral valve leak.   Review of Systems  Constitutional: + Progressive intentional weight loss , + fatigue, no subjective hyperthermia, no subjective hypothermia Eyes: no blurry vision, no xerophthalmia ENT: no sore throat, no nodules palpated in throat, no dysphagia/odynophagia, no hoarseness Cardiovascular: no Chest Pain, no Shortness of Breath, no palpitations, no leg swelling Respiratory: no cough, no shortness of breath Gastrointestinal: no Nausea/Vomiting/Diarhhea Musculoskeletal: no active  muscle/joint aches Skin: no rashes Neurological: no tremors, no numbness, no tingling, no dizziness Psychiatric: no depression, no anxiety  Objective:       01/09/2024    4:27 PM 01/09/2024    9:23 AM 11/30/2023    9:37 AM  Vitals with BMI  Height  5' 10   Weight  128 lbs   BMI  18.37   Systolic 105 92 97  Diastolic 70 64 70  Pulse  64 61    BP 105/70   Pulse 64   Ht 5' 10 (1.778 m)   Wt 128 lb (58.1 kg)   LMP 08/14/2012   BMI 18.37 kg/m   Wt Readings from Last 3 Encounters:  01/09/24 128 lb (58.1 kg)  11/29/23 133 lb (60.3 kg)  09/03/23 142 lb (64.4 kg)    Physical Exam  Constitutional:  Body mass index is 18.37 kg/m.,  not in acute distress, normal state of mind Eyes:  PERRLA, EOMI, no exophthalmos ENT: moist mucous membranes, no gross thyromegaly, no gross cervical lymphadenopathy Cardiovascular: normal precordial activity, Regular Rate and Rhythm, no Murmur/Rubs/Gallops Respiratory:  adequate breathing efforts, no gross chest deformity, Clear to auscultation bilaterally Gastrointestinal: abdomen soft, Non -tender, No distension, Bowel Sounds present, no gross organomegaly  Musculoskeletal: + Bilateral pitting edema and close feet, lower legs; superficial tender bruises on bilateral shins due to recent injury she sustained while her dog ran into her legs,  strength intact in all four extremities.  No significant kyphosis, scoliosis, no lordosis  Skin: moist, warm, no rashes Neurological: no tremor with outstretched hands, Deep tendon reflexes normal in bilateral lower extremities.  CMP ( most recent) CMP     Component Value Date/Time   NA 131 (L) 01/06/2024 1635   NA  131 (L) 01/06/2024 1635   NA 133 (A) 04/22/2018 0000   K 4.3 01/06/2024 1635   K 4.3 01/06/2024 1635   CL 98 01/06/2024 1635   CL 98 01/06/2024 1635   CO2 24 01/06/2024 1635   CO2 24 01/06/2024 1635   GLUCOSE 77 01/06/2024 1635   GLUCOSE 77 01/06/2024 1635   BUN 12 01/06/2024 1635   BUN 12 01/06/2024 1635   BUN 25 (A) 04/22/2018 0000   CREATININE 0.55 01/06/2024 1635   CREATININE 0.55 01/06/2024 1635   CREATININE 0.50 05/16/2023 0953   CALCIUM  9.0 01/06/2024 1635   CALCIUM  9.0 01/06/2024 1635   PROT 6.5 01/06/2024 1635   ALBUMIN 4.5 01/06/2024 1635   AST 32 01/06/2024 1635   ALT 23 01/06/2024 1635   ALKPHOS 35 (L) 01/06/2024 1635   BILITOT 0.7 01/06/2024 1635   GFR 98.26 01/06/2024 1635   GFR 98.26 01/06/2024 1635   EGFR 105 03/01/2023 1030   GFRNONAA >60 02/01/2023 1409   GFRNONAA 101 11/02/2019 0805    Diabetic Labs (most recent): Lab Results  Component Value Date   HGBA1C 5.2 01/09/2024     Lipid Panel ( most recent) Lipid Panel     Component Value Date/Time    CHOL 156 05/16/2023 0953   TRIG 45 05/16/2023 0953   HDL 66 05/16/2023 0953   CHOLHDL 2.4 05/16/2023 0953   LDLCALC 77 05/16/2023 0953      Lab Results  Component Value Date   TSH 1.38 05/16/2023   TSH 1.49 12/31/2022   TSH 1.55 12/13/2021   TSH 1.21 05/04/2021   TSH 1.67 10/28/2020   FREET4 0.74 12/31/2022   FREET4 0.87 12/13/2021   FREET4 0.70 10/28/2020     Bone density on April 24, 2023 ASSESSMENT: The BMD measured at Forearm Radius 33% is 0.620 g/cm2 with a T-score of -3.0. This patient is considered osteoporotic according to World Health Organization [facility]) criteria.   The quality of the exam is good. L 3, L4 were excluded due to degenerative changes.   Site Region Measured Date Measured Age YA BMD Significant CHANGE T-score Left Forearm Radius 33% 04/24/2023 61.6 -3.0 0.620 g/cm2   AP Spine L1-L2 04/24/2023 61.6 -2.8 0.823 g/cm2 * AP Spine L1-L2 10/05/2020 59.1 -3.4 0.756 g/cm2   DualFemur Neck Left 04/24/2023 61.6 -1.9 0.773 g/cm2 * DualFemur Neck Left 10/05/2020 59.1 -2.3 0.716 g/cm2 *   DualFemur Total Mean 04/24/2023 61.6 -1.5 0.814 g/cm2 * DualFemur Total Mean 10/05/2020 59.1 -2.0 0.754 g/cm2 *   World Health Organization [facility]) criteria for post-menopausal, Caucasian Women: Normal       T-score at or above -1 SD Osteopenia   T-score between -1 and -2.5 SD Osteoporosis T-score at or below -2.5 SD   RECOMMENDATION: 1. All patients should optimize calcium  and vitamin D  intake. 2. Consider FDA-approved medical therapies in postmenopausal women and men aged 73 years and older, based on the following: a. A hip or vertebral (clinical or morphometric) fracture. b. T-score = -2.5 at the femoral neck or spine after appropriate evaluation to exclude secondary causes. c. Low bone mass (T-score between -1.0 and -2.5 at the femoral neck or spine) and a 10-year probability of a hip fracture = 3% or a 10-year probability of a major osteoporosis-related fracture =  20% based on the [place] -adapted WHO algorithm. d. Clinician judgment and/or patient preferences may indicate treatment for people with 10-year fracture probabilities above or below these levels.   FOLLOW-UP: Patients with diagnosis of osteoporosis or  at high risk for fracture should have regular bone mineral density tests.? Patients eligible for Medicare are allowed routine testing every 2 years.? The testing frequency can be increased to one year for patients who have rapidly progressing disease, are receiving or discontinuing medical therapy to restore bone mass, or have additional risk factors.   I have reviewed this study and agree with the findings. [facility]  Assessment & Plan:   1. Other osteoporosis without current pathological fracture  2. Hypercalciuria  - BRITTNEE GAETANO  is being seen at a kind request of [name], [name], NP. - I have reviewed her available  records and clinically evaluated the patient. - Based on these reviews, she has severe osteoporosis which is likely multifactorial including postmenopausal, persistent unexplained hypercalciuria, light built body habitus. - Due to GI concerns, she was not given any oral bisphosphonates nor IV bisphosphonates.  Patient was started on Prolia  from the beginning.  Patient is currently on ongoing treatment with Prolia  60 mg subcutaneously every 6 months starting from 2022.  Her current labs are favorable, will receive her next treatment today. I discussed and reviewed her most recent bone density in March 2025 which showed improvement compared to her study from August 2022.  Major problem list patient seems to be persistent PTH independent hypercalciuria which is largely unexplained, hence likely idiopathic hypercalciuria.  She has no evidence of vitamin D  toxicity, granulomatous conditions, malignancy related hypercalcemia, or immobility.  On 3 separate occasions between September 2022 and October 2023 her  total 24-hour urine calcium  measurement was more than 330 mg / 24 hours.  This will translate to approximately 10 g of calcium  levels monthly which is substantial and difficult to match with daily calcium  supplement as well as dietary calcium .  She has always taken calcium  citrate which is probably a preferred preparation, I have advised patient to increase her calcium  citrate to give her a  total of at least 2000 mg of daily calcium  while maintaining her vitamin D3 at 2000 units daily.  -She runs low blood pressure, today at clinic was 105/70 mmHg.  She may not tolerate high-dose thiazides, however I have discussed and initiated low-dose thiazide therapy with chlorthalidone  12.5 mg p.o. daily at breakfast to minimize urinary calcium  loss. She is advised to continue on low-sodium diet.  She is advised to wear compressive socks to help with her bilateral lower extremity edema.  The hydrochlorothiazide may also help.  Her most recent DEXA scan shows encouraging improvement in her bone density despite ongoing severe hypercalciuria. She will be continued on Prolia  every 6 months at least until her next bone density.  If her response is not satisfactory, she will be considered for romosozumab.  She is not a suitable candidate for PTH based anabolics in light of her unexplained hypercalcemia.   Evidence of most of her weight loss is intentional, she appears to have calorie/protein deficit evident from her low BMI of 18.37 kg/m associated with feeling poorly.  She is advised not to restrict her calories/protein too much.  I suggested a BMI target of 21 to 22 kg/m for her, to lower her risk of calorie/protein deficit.    Patient is aware to get enough protein, currently almost exclusively from plants, however this patient will benefit from some animal products as well.  I discussed and recommended  healthier animal-based proteins such as lean fish, lean chicken, yogurt, and egg whites.  This is important in  addition to her graduated exercise regimen  to  help her build muscle to lower her risk of falls and fractures.  Her CBC shows elevated MCV with slightly depressed RBC mass consistent with mild megaloblastic picture with out clear anemia .  Patient gives history of aplastic anemia, may benefit from folate and vitamin B12.  I suggested for her to start vitamin B12 1000 mcg daily.  She is already taking multivitamins which has folate.    Further, deeper review of her medical history long and complicated with a constellation of manifestations including musculoskeletal,  rheumatology, vascular, ophthalmology, autoimmune, GI, hematology disorders/deficits.    She will be screened for adrenal sufficiency.  She has medical history of mitral valve prolapse, vitreous detachment, bulging disks, spinal stenosis, avascular arthropathies of large joints, SIBO, easy bruising. After exhausting her endocrine disease, this patient should be considered for possibility of other rare connective tissue disorders.  Her point-of-care A1c was 5.2% today, indicating absence of prediabetes/diabetes. She will have the following labs in 3 months: - Cortisol-am, blood - Sodium, urine, 24 hour; Future - Magnesium - Phosphorus - Calcium , urine, 24 hour; Future,  Creatinine, urine, 24 hour; Future - Vitamin D  1,25 dihydroxy - VITAMIN D  25 Hydroxy (Vit-D Deficiency, Fractures) - Alkaline phosphatase, bone specific - Propeptide Type I Collagen; Future - Comprehensive metabolic panel with GFR   - she is advised to maintain close follow up with [name], [name], NP for primary care needs.   -Thank you for involving me in the care of this pleasant patient.  Time spent with the patient: 61  minutes spent in  counseling her about osteoporosis, hypercalciuria, and the rest in obtaining information about her symptoms, reviewing her previous labs/studies (including abstractions from other facilities),  evaluations, and treatments,   and developing a plan to confirm diagnosis and long term treatment based on the latest standards of care/guidelines; and documenting her care.  Tara [name] participated in the discussions, expressed understanding, and voiced agreement with the above plans.  All questions were answered to her satisfaction. she is encouraged to contact clinic should she have any questions or concerns prior to her return visit.  Follow up plan: Return in about 6 months (around 07/08/2024), or labs in 3 months, for Prolia ([place]) Today (ASAP) and P(J) NV, 24 Hr Urine Ca & Cr.   Ranny Earl, MD [facility] [address] [place], [place] [address] Phone: [phone]  Fax: [phone]     01/09/2024, 4:27 PM  This note was partially dictated with voice recognition software. Similar sounding words can be transcribed inadequately or may not  be corrected upon review.

## 2024-01-09 NOTE — Progress Notes (Signed)
 Pt seen today to establish care with Dr. Lenis and to receive Prolia  injection. Pt provided Prolia  60mg   (Lot #[id number], Exp. Date 07/20/2026). Prolia  60mg  give SQ in R abdomen without difficulty. Pt waited the required 15 minutes post injection, no adverse reactions noted.

## 2024-01-11 ENCOUNTER — Encounter (HOSPITAL_BASED_OUTPATIENT_CLINIC_OR_DEPARTMENT_OTHER): Payer: Self-pay | Admitting: Physical Therapy

## 2024-01-11 ENCOUNTER — Ambulatory Visit (HOSPITAL_BASED_OUTPATIENT_CLINIC_OR_DEPARTMENT_OTHER): Admitting: Physical Therapy

## 2024-01-11 DIAGNOSIS — M25512 Pain in left shoulder: Secondary | ICD-10-CM | POA: Diagnosis not present

## 2024-01-11 DIAGNOSIS — M25551 Pain in right hip: Secondary | ICD-10-CM | POA: Diagnosis not present

## 2024-01-11 DIAGNOSIS — M25612 Stiffness of left shoulder, not elsewhere classified: Secondary | ICD-10-CM | POA: Diagnosis not present

## 2024-01-11 LAB — PTH, INTACT AND CALCIUM
Calcium: 9.6 mg/dL (ref 8.6–10.4)
PTH: 24 pg/mL (ref 16–77)

## 2024-01-11 NOTE — Therapy (Signed)
 OUTPATIENT PHYSICAL THERAPY UPPER EXTREMITY TREATMENT   Patient Name: Tara [name] MRN: [id number] DOB:1960-12-10, 63 y.o., female Today's Date: 01/11/2024  END OF SESSION:  PT End of Session - 01/11/24 1004     Visit Number 45    Number of Visits 51    Date for Recertification  02/06/24    PT Start Time 1000    PT Stop Time 1043    PT Time Calculation (min) 43 min    Activity Tolerance Patient tolerated treatment well    Behavior During Therapy [facility] for tasks assessed/performed                             Past Medical History:  Diagnosis Date   Allergy    Anemia    Arthritis    Hyperlipidemia    Insomnia    Mitral valve prolapse    PONV (postoperative nausea and vomiting)    Past Surgical History:  Procedure Laterality Date   AUGMENTATION MAMMAPLASTY Bilateral    Dec 2019   BREAST BIOPSY Right    axillary node benign   EYE SURGERY  2010   Lasik for distance correction; use one contact in left eye for reading now   JOINT REPLACEMENT  2021; 2022; 2025   reverse shoulder on the right; anatomic replacement on the left then conversion to reverse replacement due to failed anatomic   KNEE SURGERY  1988   right   SHOULDER ARTHROSCOPY WITH LABRAL REPAIR Right 08/28/2012   Procedure: RIGHT SHOULDER ARTHROSCOPY WITH LABRAL DEBRIDEMENT, ASPIRATION AND DECOMPRESSION OF PARALABRAL CYST;  Surgeon: Franky CHRISTELLA Pointer, MD;  Location: [facility];  Service: Orthopedics;  Laterality: Right;   TOTAL SHOULDER ARTHROPLASTY Right 03/24/2020   Procedure: RIGHT REVERSE SHOULDER ARTHROPLASTY;  Surgeon: Pointer Franky, MD;  Location: [facility];  Service: Orthopedics;  Laterality: Right;    TOTAL SHOULDER ARTHROPLASTY Left 02/23/2021   Procedure: TOTAL SHOULDER ARTHROPLASTY;  Surgeon: Pointer Franky, MD;  Location: [facility];  Service: Orthopedics;  Laterality: Left;   TOTAL SHOULDER REVISION Left 02/07/2023   Procedure: Excision Left shoulder nodule; Conversion of left  anatomic shoulder arthroplasty to reverse shoulder arthroplasty;  Surgeon: Pointer Franky, MD;  Location: [facility];  Service: Orthopedics;  Laterality: Left;   Patient Active Problem List   Diagnosis Date Noted   B12 deficiency 09/03/2023   Onychomycosis 05/10/2023   Hypercalciuria 02/02/2023   DDD (degenerative disc disease), cervical 04/13/2021   Osteoporosis without current pathological fracture 10/28/2020   Nonallopathic lesion of sacral region 02/26/2020   Hot flashes 02/23/2019   Other insomnia 02/23/2019   Cystic acne vulgaris 02/23/2019   Bunion of right foot 02/23/2019   Varicose 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 02/23/2019   Multiple atypical nevi 02/23/2019   Mitral valve prolapse 03/03/2009    PCP: Roxan [name]   REFERRING PROVIDER: JONETTA Franky Supple (shoulder) Arthea Sharps, DO (hip)  REFERRING DIAG:  Diagnosis  [id number] (ICD-10-CM) - Presence of left artificial shoulder joint  Pain right hip  THERAPY DIAG:  Acute pain of left shoulder  Pain in right hip  Stiffness of left shoulder, not elsewhere classified  Rationale for Evaluation and Treatment: Rehabilitation  ONSET DATE:  DOS: 02/07/2023  SUBJECTIVE:  SUBJECTIVE STATEMENT: Have gotten some improvement in Rt proximal HS with US . Was doing a sidebend stretch to the Left and felt a pull and immediate excruciating pain through groin and to lateral left knee. Now have the same excruciating pain on the left as I had in the right. If I sit and bend over to tie my shoe I feel severe pain in Lt upper glute.   From Initial Evaluation: Patient had a left standard total shoulder replacement last year.  She did well for about 6 months then began having progressive pain in the shoulder.  She had to have a shoulder revision with a  reverse total shoulder.  At this time her pain is well-controlled.  She is also had a right shoulder replaced.  When she had that replaced she had significant pain.  She reports this 1 is better.  She is wearing her sling outside the house and when sleeping.  She is otherwise has been wearing her sling.  She has increased pain when she moves her arm.  She has been compliant with her HEP so far. Hand dominance: Right  PERTINENT HISTORY: Multijoint arthritis including knees and hips.  Osteoporosis, right shoulder replacement, mitral valve prolapse  PAIN:  Are you having pain? Yes: NPRS scale: 0/10  Pain location: hips to neck Pain description: sore Aggravating factors: Use of the arm Relieving factors: Not using the arm is much  PRECAUTIONS: Shoulder  RED FLAGS: None   WEIGHT BEARING RESTRICTIONS: No  FALLS:  Has patient fallen in last 6 months? No  LIVING ENVIRONMENT:   OCCUPATION: Nurse   PLOF: Independent  PATIENT GOALS:  Patient wants to be functional   OBJECTIVE:  Note: Objective measures were completed at Evaluation unless otherwise noted. IMAGING:  MRI Rt hip 8/21 IMPRESSION: 1. Mild right hip osteoarthritis. 2. Bilateral hamstring and gluteal tendinosis. 3. Mild peritrochanteric bursitis bilaterally. 4. Small volume free fluid within the pelvis, nonspecific.  MRI lumbar 8/12 IMPRESSION: 1. Mild canal stenosis at L3-L4. 2. Moderate left and mild right foraminal stenosis at L5-S1 secondary to disc bulging and facet arthropathy.   PATIENT SURVEYS :  UEFI 40/80 UEFI 01/11/24 45/80   UPPER EXTREMITY ROM:   Passive ROM Right eval Left eval Left 1/21 L  L  Shoulder flexion  85 106 130AROM 160PROM  148AROM supine 158PROM  Shoulder extension       Shoulder abduction     112 active supine  Shoulder adduction       Shoulder internal rotation  Can sit on body comfortably   L gute  65/L iliac crest but can get to L4 with some compensation  Shoulder  external rotation  20 26  50/T3  Elbow flexion       Elbow extension       Wrist flexion       Wrist extension       Wrist ulnar deviation       Wrist radial deviation       Wrist pronation       Wrist supination       (Blank rows = not tested)  UPPER EXTREMITY MMT:  MMT Right 3/25 Left 3/25 Lt 4/22 Rt/Lt 9/25 Rt/Lt 11/22  Shoulder flexion 15.4 8.2 9.5 24.4/16.6 20.3/16.2  Shoulder extension       Shoulder abduction 9.9 6.8 7.3 20.6/16.7 13.8/12.8  Shoulder adduction       Shoulder internal rotation 11.3 8.8 6.4 15.4/11.9 8.6/6.3  Shoulder external rotation 11.5 8.1 7.8 15.3/12.5  7.2/8.3  (Blank rows = not tested)    8/21- did not take strength tests for LEs due to high levels of pain  MMT Right 9/25 Left 9/25  Hip flexion 31.8 29.6  Hip extension    Hip abduction 36.5 36.9  Hip adduction    Hip internal rotation    Hip external rotation    Knee flexion 25.5 26.9  Knee extension 48.1 47.1   (Blank rows = not tested)    PALPATION:  No unexpected TTP    LUMBAR SCREEN 7/3:  Negative change in standing extension, increased pain in flexion  Negative for change in prone on elbows static or dynamic  Negative for change with lumbar mobilizations  Negative SLR passive, positive for concordant pain in active SLR  Tightness in hamstrings & lateral hip  Negative concordant pain in resisted hamstring MMT or hip ext MMT  Negative concordant pain in sidelying hip abd (but is weak), proximal HS pain with sidelying hip abd+hip flexion  Negative cocordant pain with supine march bent knee, positive concordant pain active SLR on Rt (no change with movement of left leg)  Decreased concordant pain for a short amount of time following IASTM to hamstrings & STM to glut med/min                                                                                                                            TREATMENT DATE:  01/11/24 Ultrasound- 8 min 3.3 MHz, 1.0 w/cm2- Lt sacral border &  Lt ischial tuberosity MMT & UEFI for Lt GHJ testing Standing postural alignment Standing forward flexion with posture awareness- alternating on and off of airex with toes off the front, began with 1lb & progressed to 4. Eccentric lowering below 90 deg resulted in compensation via deltoid and biceps popping- used row motion for strength and proprioception training for now.     12/24/23 Ultrasound- 8 min 3.3 MHz, 1.0 w/cm2- Rt sacral border & Rt ischial tuberosity Able to do child pose and forward hinge in standing without onset of pain following US  today.  Review of resting posture- pelvis stacked and TA engaged  Added UE ER, forward reach/serve, overhead reach  12/16/23 Ultrasound- 8 min 3.3 MHz, 1.0 w/cm2- Rt sacral border Prone hip ext knee flexed- trunk on table, legs off Standing postural alignment- suggested laser level use at home  12/10/23 IASTM Rt iliac crest and Rt sacral border Prone Rt sacrum PA mobs Supine eccentric march in neutral & turnout Squat on toes, sponge under heels  Static stance on bosu with mirror for VC     PATIENT EDUCATION: Education details:  HEP, exercise form, rationale of interventions, protocol restrictions and limitations, protocol ranges, and ROM findings Person educated: Patient Education method: Explanation, Demonstration, Tactile cues, Verbal cues Education comprehension: verbalized understanding, returned demonstration, verbal cues required, tactile cues required, and needs further education  HOME EXERCISE PROGRAM: Access Code: [email or web address] URL: [email or web address]   ASSESSMENT:  CLINICAL IMPRESSION: Strength  has decreased but is more equal right to left which is providing pt with improved posture and functional use of UEs. As she is more equal, progression of strength will require fewer compensatory patterns. Presses Left hip forward in Lt shoulder elevation and reduced with cues. Injury to Lt proximal hamstring region last  week with concordant pain to the right so that was treated with US  as pt has made positive improvements with that treatment on the right.    OBJECTIVE IMPAIRMENTS:  decreased ROM, decreased strength, impaired UE functional use, and pain.   ACTIVITY LIMITATIONS: carrying, lifting, sleeping, bathing, toileting, dressing, self feeding, reach over head, and hygiene/grooming  PARTICIPATION LIMITATIONS: meal prep, cleaning, laundry, driving, shopping, community activity, occupation, and yard work  PERSONAL FACTORS: 1-2 comorbidities: previous replacement on the same shoulder osteoporosis; multi joint OA  are also affecting patient's functional outcome.   REHAB POTENTIAL: Good  CLINICAL DECISION MAKING: Stable/uncomplicated  EVALUATION COMPLEXITY: Low  GOALS: Goals reviewed with patient? Yes  SHORT TERM GOALS: Target date: 04/16/2023    Patient will increase passive flexion to 115 degrees  Baseline: Goal status: MET  2.  Patient will increase left shoulder  ER to 30 degrees  Baseline:  Goal status: MET  3.  Patient will use her left arm for ADL's below 90 degrees with > 2/10 pain  Baseline:  Goal status: MET   LONG TERM GOALS: Target date: POC DATE    The patient will reach overhead with left arm without pain in order to perform ADL's  Baseline:  Goal status: MET  2.  Patient will each behind her head without pain in order to per from her ADL's  Baseline:  Goal status: MET  3.  Patient will reach behind her back to L3 without pain in order to perform ADL's  Baseline:  Goal status: MET  4. Able to demo overhead activity without scapular winging bilaterally  Status: MET 5. Strength available for ADLs with weight/resistance such as washing dishes, vacuuming   Status: ongoing- has met for shoulder but limited by hip/back/buttock pain 6. Feel prepared to lift grand children when I have them.   Status: New 7. Able to sit for at least 10 minutes without needing to stand  due to pain  Baseline: activated it hurts immediately, but if I am not I may be able to make it 5 minutes now!  Status: ongoing 8. Demonstrate proper core activation in exercises for lumbopelvic support  Status: New   PLAN: PT FREQUENCY: 2x/week  PT DURATION: 8 weeks  PLANNED INTERVENTIONS: 97110-Therapeutic exercises, 97530- Therapeutic activity, [id number]- Neuromuscular re-education, 97535- Self Care, [id number]- Manual therapy, 7113- Aquatic Therapy, 97014- Electrical stimulation (unattended), 97035- Ultrasound, Patient/Family education, Taping, Dry Needling, DME instructions, Cryotherapy, and Moist heat   PLAN FOR NEXT SESSION:    Review core contraction.    Agustine Rossitto C. Nakiah Osgood PT, DPT 01/11/24 11:30 AM

## 2024-01-12 ENCOUNTER — Encounter (HOSPITAL_COMMUNITY): Payer: Self-pay

## 2024-01-12 ENCOUNTER — Encounter: Payer: Self-pay | Admitting: "Endocrinology

## 2024-01-13 ENCOUNTER — Ambulatory Visit: Admitting: Podiatry

## 2024-01-14 ENCOUNTER — Ambulatory Visit (HOSPITAL_BASED_OUTPATIENT_CLINIC_OR_DEPARTMENT_OTHER): Admitting: Physical Therapy

## 2024-01-14 ENCOUNTER — Encounter (HOSPITAL_BASED_OUTPATIENT_CLINIC_OR_DEPARTMENT_OTHER): Payer: Self-pay | Admitting: Physical Therapy

## 2024-01-14 DIAGNOSIS — M25612 Stiffness of left shoulder, not elsewhere classified: Secondary | ICD-10-CM

## 2024-01-14 DIAGNOSIS — M25512 Pain in left shoulder: Secondary | ICD-10-CM | POA: Diagnosis not present

## 2024-01-14 DIAGNOSIS — M25551 Pain in right hip: Secondary | ICD-10-CM

## 2024-01-14 NOTE — Therapy (Signed)
 OUTPATIENT PHYSICAL THERAPY UPPER EXTREMITY TREATMENT   Patient Name: ISAAC LACSON MRN: [id number] DOB:03-04-1960, 63 y.o., female Today's Date: 01/14/2024  END OF SESSION:  PT End of Session - 01/14/24 1019     Visit Number 46    Number of Visits 51    Date for Recertification  02/06/24    PT Start Time 1015    PT Stop Time 1054    PT Time Calculation (min) 39 min    Activity Tolerance Patient tolerated treatment well    Behavior During Therapy [facility] for tasks assessed/performed                              Past Medical History:  Diagnosis Date   Allergy    Anemia    Arthritis    Hyperlipidemia    Insomnia    Mitral valve prolapse    PONV (postoperative nausea and vomiting)    Past Surgical History:  Procedure Laterality Date   AUGMENTATION MAMMAPLASTY Bilateral    Dec 2019   BREAST BIOPSY Right    axillary node benign   EYE SURGERY  2010   Lasik for distance correction; use one contact in left eye for reading now   JOINT REPLACEMENT  2021; 2022; 2025   reverse shoulder on the right; anatomic replacement on the left then conversion to reverse replacement due to failed anatomic   KNEE SURGERY  1988   right   SHOULDER ARTHROSCOPY WITH LABRAL REPAIR Right 08/28/2012   Procedure: RIGHT SHOULDER ARTHROSCOPY WITH LABRAL DEBRIDEMENT, ASPIRATION AND DECOMPRESSION OF PARALABRAL CYST;  Surgeon: Franky CHRISTELLA Pointer, MD;  Location: [facility];  Service: Orthopedics;  Laterality: Right;   TOTAL SHOULDER ARTHROPLASTY Right 03/24/2020   Procedure: RIGHT REVERSE SHOULDER ARTHROPLASTY;  Surgeon: Pointer Franky, MD;  Location: [facility];  Service: Orthopedics;  Laterality: Right;    TOTAL SHOULDER ARTHROPLASTY Left 02/23/2021   Procedure: TOTAL SHOULDER ARTHROPLASTY;  Surgeon: Pointer Franky, MD;  Location: [facility];  Service: Orthopedics;  Laterality: Left;   TOTAL SHOULDER REVISION Left 02/07/2023   Procedure: Excision Left shoulder nodule; Conversion of left  anatomic shoulder arthroplasty to reverse shoulder arthroplasty;  Surgeon: Pointer Franky, MD;  Location: [facility];  Service: Orthopedics;  Laterality: Left;   Patient Active Problem List   Diagnosis Date Noted   B12 deficiency 09/03/2023   Onychomycosis 05/10/2023   Hypercalciuria 02/02/2023   DDD (degenerative disc disease), cervical 04/13/2021   Osteoporosis without current pathological fracture 10/28/2020   Nonallopathic lesion of sacral region 02/26/2020   Hot flashes 02/23/2019   Other insomnia 02/23/2019   Cystic acne vulgaris 02/23/2019   Bunion of right foot 02/23/2019   Varicose 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 02/23/2019   Multiple atypical nevi 02/23/2019   Mitral valve prolapse 03/03/2009    PCP: Roxan [name]   REFERRING PROVIDER: JONETTA Franky Supple (shoulder) Arthea Sharps, DO (hip)  REFERRING DIAG:  Diagnosis  [phone] (ICD-10-CM) - Presence of left artificial shoulder joint  Pain right hip  THERAPY DIAG:  Acute pain of left shoulder  Pain in right hip  Stiffness of left shoulder, not elsewhere classified  Rationale for Evaluation and Treatment: Rehabilitation  ONSET DATE:  DOS: 02/07/2023  SUBJECTIVE:  SUBJECTIVE STATEMENT: Lt ischial tuberosity still in excruciating pain and as soon as I sit in the car it is bad. Rt SIJ pain with standing. Lt shoulder is tight at the incision site area.   From Initial Evaluation: Patient had a left standard total shoulder replacement last year.  She did well for about 6 months then began having progressive pain in the shoulder.  She had to have a shoulder revision with a reverse total shoulder.  At this time her pain is well-controlled.  She is also had a right shoulder replaced.  When she had that replaced she had significant pain.   She reports this 1 is better.  She is wearing her sling outside the house and when sleeping.  She is otherwise has been wearing her sling.  She has increased pain when she moves her arm.  She has been compliant with her HEP so far. Hand dominance: Right  PERTINENT HISTORY: Multijoint arthritis including knees and hips.  Osteoporosis, right shoulder replacement, mitral valve prolapse  PAIN:  Are you having pain? Yes: NPRS scale: excruciating/10  Pain location: Lt ischial tuberosity Pain description: burning Aggravating factors: sitting Relieving factors: movement  PRECAUTIONS: Shoulder  RED FLAGS: None   WEIGHT BEARING RESTRICTIONS: No  FALLS:  Has patient fallen in last 6 months? No  LIVING ENVIRONMENT:   OCCUPATION: Nurse   PLOF: Independent  PATIENT GOALS:  Patient wants to be functional   OBJECTIVE:  Note: Objective measures were completed at Evaluation unless otherwise noted. IMAGING:  MRI Rt hip 8/21 IMPRESSION: 1. Mild right hip osteoarthritis. 2. Bilateral hamstring and gluteal tendinosis. 3. Mild peritrochanteric bursitis bilaterally. 4. Small volume free fluid within the pelvis, nonspecific.  MRI lumbar 8/12 IMPRESSION: 1. Mild canal stenosis at L3-L4. 2. Moderate left and mild right foraminal stenosis at L5-S1 secondary to disc bulging and facet arthropathy.   PATIENT SURVEYS :  UEFI 40/80 UEFI 01/11/24 45/80   UPPER EXTREMITY ROM:   Passive ROM Right eval Left eval Left 1/21 L  L  Shoulder flexion  85 106 130AROM 160PROM  148AROM supine 158PROM  Shoulder extension       Shoulder abduction     112 active supine  Shoulder adduction       Shoulder internal rotation  Can sit on body comfortably   L gute  65/L iliac crest but can get to L4 with some compensation  Shoulder external rotation  20 26  50/T3  Elbow flexion       Elbow extension       Wrist flexion       Wrist extension       Wrist ulnar deviation       Wrist radial deviation        Wrist pronation       Wrist supination       (Blank rows = not tested)  UPPER EXTREMITY MMT:  MMT Right 3/25 Left 3/25 Lt 4/22 Rt/Lt 9/25 Rt/Lt 11/22  Shoulder flexion 15.4 8.2 9.5 24.4/16.6 20.3/16.2  Shoulder extension       Shoulder abduction 9.9 6.8 7.3 20.6/16.7 13.8/12.8  Shoulder adduction       Shoulder internal rotation 11.3 8.8 6.4 15.4/11.9 8.6/6.3  Shoulder external rotation 11.5 8.1 7.8 15.3/12.5 7.2/8.3  (Blank rows = not tested)    8/21- did not take strength tests for LEs due to high levels of pain  MMT Right 9/25 Left 9/25  Hip flexion 31.8 29.6  Hip extension  Hip abduction 36.5 36.9  Hip adduction    Hip internal rotation    Hip external rotation    Knee flexion 25.5 26.9  Knee extension 48.1 47.1   (Blank rows = not tested)    PALPATION:  No unexpected TTP    LUMBAR SCREEN 7/3:  Negative change in standing extension, increased pain in flexion  Negative for change in prone on elbows static or dynamic  Negative for change with lumbar mobilizations  Negative SLR passive, positive for concordant pain in active SLR  Tightness in hamstrings & lateral hip  Negative concordant pain in resisted hamstring MMT or hip ext MMT  Negative concordant pain in sidelying hip abd (but is weak), proximal HS pain with sidelying hip abd+hip flexion  Negative cocordant pain with supine march bent knee, positive concordant pain active SLR on Rt (no change with movement of left leg)  Decreased concordant pain for a short amount of time following IASTM to hamstrings & STM to glut med/min                                                                                                                            TREATMENT DATE:  01/14/24 Ultrasound- 8 min 3.3 MHz, 1.0 w/cm2- Lt sacral border & Lt ischial tuberosity Seated quadratus femoris mob with movement- hip hinge Supine legs up wall- SLR with core activation Double leg lift from wall- abduct &  return Supine scar mobilization Lt shoulder  01/11/24 Ultrasound- 8 min 3.3 MHz, 1.0 w/cm2- Lt sacral border & Lt ischial tuberosity MMT & UEFI for Lt GHJ testing Standing postural alignment Standing forward flexion with posture awareness- alternating on and off of airex with toes off the front, began with 1lb & progressed to 4. Eccentric lowering below 90 deg resulted in compensation via deltoid and biceps popping- used row motion for strength and proprioception training for now.     12/24/23 Ultrasound- 8 min 3.3 MHz, 1.0 w/cm2- Rt sacral border & Rt ischial tuberosity Able to do child pose and forward hinge in standing without onset of pain following US  today.  Review of resting posture- pelvis stacked and TA engaged  Added UE ER, forward reach/serve, overhead reach  12/16/23 Ultrasound- 8 min 3.3 MHz, 1.0 w/cm2- Rt sacral border Prone hip ext knee flexed- trunk on table, legs off Standing postural alignment- suggested laser level use at home      PATIENT EDUCATION: Education details:  HEP, exercise form, rationale of interventions, protocol restrictions and limitations, protocol ranges, and ROM findings Person educated: Patient Education method: Explanation, Demonstration, Tactile cues, Verbal cues Education comprehension: verbalized understanding, returned demonstration, verbal cues required, tactile cues required, and needs further education  HOME EXERCISE PROGRAM: Access Code: [email or web address] URL: [email or web address]   ASSESSMENT:  CLINICAL IMPRESSION: S/s consistent with Lt quadratus femoris spasm and piriformis tightness. Able to reduce with STM. Use of supine 90 deg hip flexion, hip abd for eccentric control through adductors and core stabilization.  OBJECTIVE IMPAIRMENTS:  decreased ROM, decreased strength, impaired UE functional use, and pain.   ACTIVITY LIMITATIONS: carrying, lifting, sleeping, bathing, toileting, dressing, self feeding, reach over  head, and hygiene/grooming  PARTICIPATION LIMITATIONS: meal prep, cleaning, laundry, driving, shopping, community activity, occupation, and yard work  PERSONAL FACTORS: 1-2 comorbidities: previous replacement on the same shoulder osteoporosis; multi joint OA  are also affecting patient's functional outcome.   REHAB POTENTIAL: Good  CLINICAL DECISION MAKING: Stable/uncomplicated  EVALUATION COMPLEXITY: Low  GOALS: Goals reviewed with patient? Yes  SHORT TERM GOALS: Target date: 04/16/2023    Patient will increase passive flexion to 115 degrees  Baseline: Goal status: MET  2.  Patient will increase left shoulder  ER to 30 degrees  Baseline:  Goal status: MET  3.  Patient will use her left arm for ADL's below 90 degrees with > 2/10 pain  Baseline:  Goal status: MET   LONG TERM GOALS: Target date: POC DATE    The patient will reach overhead with left arm without pain in order to perform ADL's  Baseline:  Goal status: MET  2.  Patient will each behind her head without pain in order to per from her ADL's  Baseline:  Goal status: MET  3.  Patient will reach behind her back to L3 without pain in order to perform ADL's  Baseline:  Goal status: MET  4. Able to demo overhead activity without scapular winging bilaterally  Status: MET 5. Strength available for ADLs with weight/resistance such as washing dishes, vacuuming   Status: ongoing- has met for shoulder but limited by hip/back/buttock pain 6. Feel prepared to lift grand children when I have them.   Status: New 7. Able to sit for at least 10 minutes without needing to stand due to pain  Baseline: activated it hurts immediately, but if I am not I may be able to make it 5 minutes now!  Status: ongoing 8. Demonstrate proper core activation in exercises for lumbopelvic support  Status: New   PLAN: PT FREQUENCY: 2x/week  PT DURATION: 8 weeks  PLANNED INTERVENTIONS: 97110-Therapeutic exercises, 97530- Therapeutic  activity, [id number]- Neuromuscular re-education, 97535- Self Care, 02859- Manual therapy, 7113- Aquatic Therapy, 97014- Electrical stimulation (unattended), 97035- Ultrasound, Patient/Family education, Taping, Dry Needling, DME instructions, Cryotherapy, and Moist heat   PLAN FOR NEXT SESSION:    Review core contraction.    Poet Hineman C. Annabelle Rexroad PT, DPT 01/14/24 10:56 AM

## 2024-01-15 ENCOUNTER — Other Ambulatory Visit (HOSPITAL_COMMUNITY): Payer: Self-pay

## 2024-01-20 ENCOUNTER — Ambulatory Visit (HOSPITAL_BASED_OUTPATIENT_CLINIC_OR_DEPARTMENT_OTHER): Attending: Orthopedic Surgery | Admitting: Physical Therapy

## 2024-01-20 ENCOUNTER — Encounter (HOSPITAL_BASED_OUTPATIENT_CLINIC_OR_DEPARTMENT_OTHER): Payer: Self-pay | Admitting: Physical Therapy

## 2024-01-20 DIAGNOSIS — M25512 Pain in left shoulder: Secondary | ICD-10-CM | POA: Insufficient documentation

## 2024-01-20 DIAGNOSIS — M25551 Pain in right hip: Secondary | ICD-10-CM | POA: Insufficient documentation

## 2024-01-20 DIAGNOSIS — M545 Low back pain, unspecified: Secondary | ICD-10-CM | POA: Insufficient documentation

## 2024-01-20 DIAGNOSIS — M25612 Stiffness of left shoulder, not elsewhere classified: Secondary | ICD-10-CM | POA: Diagnosis present

## 2024-01-20 DIAGNOSIS — M25511 Pain in right shoulder: Secondary | ICD-10-CM | POA: Diagnosis present

## 2024-01-20 DIAGNOSIS — G8929 Other chronic pain: Secondary | ICD-10-CM | POA: Diagnosis present

## 2024-01-20 NOTE — Therapy (Signed)
 OUTPATIENT PHYSICAL THERAPY UPPER EXTREMITY TREATMENT   Patient Name: Tara [name] MRN: [id number] DOB:1961-01-30, 63 y.o., female Today's Date: 01/20/2024  END OF SESSION:  PT End of Session - 01/20/24 1527     Visit Number 47    Number of Visits 51    Date for Recertification  02/06/24    PT Start Time 1527    PT Stop Time 1610    PT Time Calculation (min) 43 min    Activity Tolerance Patient tolerated treatment well    Behavior During Therapy [facility] for tasks assessed/performed                              Past Medical History:  Diagnosis Date   Allergy    Anemia    Arthritis    Hyperlipidemia    Insomnia    Mitral valve prolapse    PONV (postoperative nausea and vomiting)    Past Surgical History:  Procedure Laterality Date   AUGMENTATION MAMMAPLASTY Bilateral    Dec 2019   BREAST BIOPSY Right    axillary node benign   EYE SURGERY  2010   Lasik for distance correction; use one contact in left eye for reading now   JOINT REPLACEMENT  2021; 2022; 2025   reverse shoulder on the right; anatomic replacement on the left then conversion to reverse replacement due to failed anatomic   KNEE SURGERY  1988   right   SHOULDER ARTHROSCOPY WITH LABRAL REPAIR Right 08/28/2012   Procedure: RIGHT SHOULDER ARTHROSCOPY WITH LABRAL DEBRIDEMENT, ASPIRATION AND DECOMPRESSION OF PARALABRAL CYST;  Surgeon: Franky CHRISTELLA Pointer, MD;  Location: [facility];  Service: Orthopedics;  Laterality: Right;   TOTAL SHOULDER ARTHROPLASTY Right 03/24/2020   Procedure: RIGHT REVERSE SHOULDER ARTHROPLASTY;  Surgeon: Pointer Franky, MD;  Location: [facility];  Service: Orthopedics;  Laterality: Right;    TOTAL SHOULDER ARTHROPLASTY Left 02/23/2021   Procedure: TOTAL SHOULDER ARTHROPLASTY;  Surgeon: Pointer Franky, MD;  Location: [facility];  Service: Orthopedics;  Laterality: Left;   TOTAL SHOULDER REVISION Left 02/07/2023   Procedure: Excision Left shoulder nodule; Conversion of left  anatomic shoulder arthroplasty to reverse shoulder arthroplasty;  Surgeon: Pointer Franky, MD;  Location: [facility];  Service: Orthopedics;  Laterality: Left;   Patient Active Problem List   Diagnosis Date Noted   B12 deficiency 09/03/2023   Onychomycosis 05/10/2023   Hypercalciuria 02/02/2023   DDD (degenerative disc disease), cervical 04/13/2021   Osteoporosis without current pathological fracture 10/28/2020   Nonallopathic lesion of sacral region 02/26/2020   Hot flashes 02/23/2019   Other insomnia 02/23/2019   Cystic acne vulgaris 02/23/2019   Bunion of right foot 02/23/2019   Varicose 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 02/23/2019   Multiple atypical nevi 02/23/2019   Mitral valve prolapse 03/03/2009    PCP: Roxan [name]   REFERRING PROVIDER: JONETTA Franky Supple (shoulder) Arthea Sharps, DO (hip)  REFERRING DIAG:  Diagnosis  [phone] (ICD-10-CM) - Presence of left artificial shoulder joint  Pain right hip  THERAPY DIAG:  Acute pain of left shoulder  Pain in right hip  Stiffness of left shoulder, not elsewhere classified  Rationale for Evaluation and Treatment: Rehabilitation  ONSET DATE:  DOS: 02/07/2023  SUBJECTIVE:  SUBJECTIVE STATEMENT: Lt ischial tuberosity still in excruciating pain and as soon as I sit in the car it is bad. Rt SIJ pain with standing. Lt shoulder is tight at the incision site area.   From Initial Evaluation: Patient had a left standard total shoulder replacement last year.  She did well for about 6 months then began having progressive pain in the shoulder.  She had to have a shoulder revision with a reverse total shoulder.  At this time her pain is well-controlled.  She is also had a right shoulder replaced.  When she had that replaced she had significant pain.   She reports this 1 is better.  She is wearing her sling outside the house and when sleeping.  She is otherwise has been wearing her sling.  She has increased pain when she moves her arm.  She has been compliant with her HEP so far. Hand dominance: Right  PERTINENT HISTORY: Multijoint arthritis including knees and hips.  Osteoporosis, right shoulder replacement, mitral valve prolapse  PAIN:  Are you having pain? Yes: NPRS scale: excruciating/10  Pain location: Lt ischial tuberosity Pain description: burning Aggravating factors: sitting Relieving factors: movement  PRECAUTIONS: Shoulder  RED FLAGS: None   WEIGHT BEARING RESTRICTIONS: No  FALLS:  Has patient fallen in last 6 months? No  LIVING ENVIRONMENT:   OCCUPATION: Nurse   PLOF: Independent  PATIENT GOALS:  Patient wants to be functional   OBJECTIVE:  Note: Objective measures were completed at Evaluation unless otherwise noted. IMAGING:  MRI Rt hip 8/21 IMPRESSION: 1. Mild right hip osteoarthritis. 2. Bilateral hamstring and gluteal tendinosis. 3. Mild peritrochanteric bursitis bilaterally. 4. Small volume free fluid within the pelvis, nonspecific.  MRI lumbar 8/12 IMPRESSION: 1. Mild canal stenosis at L3-L4. 2. Moderate left and mild right foraminal stenosis at L5-S1 secondary to disc bulging and facet arthropathy.   PATIENT SURVEYS :  UEFI 40/80 UEFI 01/11/24 45/80   UPPER EXTREMITY ROM:   Passive ROM Right eval Left eval Left 1/21 L  L  Shoulder flexion  85 106 130AROM 160PROM  148AROM supine 158PROM  Shoulder extension       Shoulder abduction     112 active supine  Shoulder adduction       Shoulder internal rotation  Can sit on body comfortably   L gute  65/L iliac crest but can get to L4 with some compensation  Shoulder external rotation  20 26  50/T3  Elbow flexion       Elbow extension       Wrist flexion       Wrist extension       Wrist ulnar deviation       Wrist radial deviation        Wrist pronation       Wrist supination       (Blank rows = not tested)  UPPER EXTREMITY MMT:  MMT Right 3/25 Left 3/25 Lt 4/22 Rt/Lt 9/25 Rt/Lt 11/22  Shoulder flexion 15.4 8.2 9.5 24.4/16.6 20.3/16.2  Shoulder extension       Shoulder abduction 9.9 6.8 7.3 20.6/16.7 13.8/12.8  Shoulder adduction       Shoulder internal rotation 11.3 8.8 6.4 15.4/11.9 8.6/6.3  Shoulder external rotation 11.5 8.1 7.8 15.3/12.5 7.2/8.3  (Blank rows = not tested)    8/21- did not take strength tests for LEs due to high levels of pain  MMT Right 9/25 Left 9/25  Hip flexion 31.8 29.6  Hip extension  Hip abduction 36.5 36.9  Hip adduction    Hip internal rotation    Hip external rotation    Knee flexion 25.5 26.9  Knee extension 48.1 47.1   (Blank rows = not tested)    PALPATION:  No unexpected TTP    LUMBAR SCREEN 7/3:  Negative change in standing extension, increased pain in flexion  Negative for change in prone on elbows static or dynamic  Negative for change with lumbar mobilizations  Negative SLR passive, positive for concordant pain in active SLR  Tightness in hamstrings & lateral hip  Negative concordant pain in resisted hamstring MMT or hip ext MMT  Negative concordant pain in sidelying hip abd (but is weak), proximal HS pain with sidelying hip abd+hip flexion  Negative cocordant pain with supine march bent knee, positive concordant pain active SLR on Rt (no change with movement of left leg)  Decreased concordant pain for a short amount of time following IASTM to hamstrings & STM to glut med/min                                                                                                                            TREATMENT DATE:  01/20/24 Qped rock back to wall- cues for right hip back Tall kneel hip hinge, feet against wall Tall kneel eccentric hamstring hinge forward, feet on wall Bridge on wall with adduction squeeze- cues for hamstrings Half kneel- pair  breathing with HS drag- full rib cage expansion, stack over bottom knee.  US -8 min 3.3 MHz, 1.0 w/cm2- Lt ischial tuberosity  01/14/24 Ultrasound- 8 min 3.3 MHz, 1.0 w/cm2- Lt sacral border & Lt ischial tuberosity Seated quadratus femoris mob with movement- hip hinge Supine legs up wall- SLR with core activation Double leg lift from wall- abduct & return Supine scar mobilization Lt shoulder  01/11/24 Ultrasound- 8 min 3.3 MHz, 1.0 w/cm2- Lt sacral border & Lt ischial tuberosity MMT & UEFI for Lt GHJ testing Standing postural alignment Standing forward flexion with posture awareness- alternating on and off of airex with toes off the front, began with 1lb & progressed to 4. Eccentric lowering below 90 deg resulted in compensation via deltoid and biceps popping- used row motion for strength and proprioception training for now.     12/24/23 Ultrasound- 8 min 3.3 MHz, 1.0 w/cm2- Rt sacral border & Rt ischial tuberosity Able to do child pose and forward hinge in standing without onset of pain following US  today.  Review of resting posture- pelvis stacked and TA engaged  Added UE ER, forward reach/serve, overhead reach  12/16/23 Ultrasound- 8 min 3.3 MHz, 1.0 w/cm2- Rt sacral border Prone hip ext knee flexed- trunk on table, legs off Standing postural alignment- suggested laser level use at home      PATIENT EDUCATION: Education details:  HEP, exercise form, rationale of interventions, protocol restrictions and limitations, protocol ranges, and ROM findings Person educated: Patient Education method: Explanation, Demonstration, Tactile cues, Verbal cues Education comprehension:  verbalized understanding, returned demonstration, verbal cues required, tactile cues required, and needs further education  HOME EXERCISE PROGRAM: Access Code: [email or web address] URL: [email or web address]   ASSESSMENT:  CLINICAL IMPRESSION: Able to centralize pain to Lt ischial tuberosity with  hamstring engagement and then utilized US  at the smaller area. Bilateral shoulders have great mobility and incision sites move well with UE motion. I believe that the gross tenderness has contribution from pain in multiple body parts causing stress. Discussed focusing on one thing at a time- in this case, the hamstring pain- to decrease overall stress.   OBJECTIVE IMPAIRMENTS:  decreased ROM, decreased strength, impaired UE functional use, and pain.   ACTIVITY LIMITATIONS: carrying, lifting, sleeping, bathing, toileting, dressing, self feeding, reach over head, and hygiene/grooming  PARTICIPATION LIMITATIONS: meal prep, cleaning, laundry, driving, shopping, community activity, occupation, and yard work  PERSONAL FACTORS: 1-2 comorbidities: previous replacement on the same shoulder osteoporosis; multi joint OA  are also affecting patient's functional outcome.   REHAB POTENTIAL: Good  CLINICAL DECISION MAKING: Stable/uncomplicated  EVALUATION COMPLEXITY: Low  GOALS: Goals reviewed with patient? Yes  SHORT TERM GOALS: Target date: 04/16/2023    Patient will increase passive flexion to 115 degrees  Baseline: Goal status: MET  2.  Patient will increase left shoulder  ER to 30 degrees  Baseline:  Goal status: MET  3.  Patient will use her left arm for ADL's below 90 degrees with > 2/10 pain  Baseline:  Goal status: MET   LONG TERM GOALS: Target date: POC DATE    The patient will reach overhead with left arm without pain in order to perform ADL's  Baseline:  Goal status: MET  2.  Patient will each behind her head without pain in order to per from her ADL's  Baseline:  Goal status: MET  3.  Patient will reach behind her back to L3 without pain in order to perform ADL's  Baseline:  Goal status: MET  4. Able to demo overhead activity without scapular winging bilaterally  Status: MET 5. Strength available for ADLs with weight/resistance such as washing dishes, vacuuming    Status: ongoing- has met for shoulder but limited by hip/back/buttock pain 6. Feel prepared to lift grand children when I have them.   Status: New 7. Able to sit for at least 10 minutes without needing to stand due to pain  Baseline: activated it hurts immediately, but if I am not I may be able to make it 5 minutes now!  Status: ongoing 8. Demonstrate proper core activation in exercises for lumbopelvic support  Status: New   PLAN: PT FREQUENCY: 2x/week  PT DURATION: 8 weeks  PLANNED INTERVENTIONS: 97110-Therapeutic exercises, 97530- Therapeutic activity, [id number]- Neuromuscular re-education, 97535- Self Care, [id number]- Manual therapy, 7113- Aquatic Therapy, [id number]- Electrical stimulation (unattended), 97035- Ultrasound, Patient/Family education, Taping, Dry Needling, DME instructions, Cryotherapy, and Moist heat   PLAN FOR NEXT SESSION:    Review core contraction.    Favor Hackler C. Ercel Normoyle PT, DPT 01/20/24 4:57 PM

## 2024-01-21 ENCOUNTER — Encounter (HOSPITAL_BASED_OUTPATIENT_CLINIC_OR_DEPARTMENT_OTHER): Admitting: Physical Therapy

## 2024-01-24 ENCOUNTER — Other Ambulatory Visit: Payer: Self-pay | Admitting: Neurosurgery

## 2024-01-24 ENCOUNTER — Other Ambulatory Visit (HOSPITAL_COMMUNITY): Payer: Self-pay

## 2024-01-24 DIAGNOSIS — D352 Benign neoplasm of pituitary gland: Secondary | ICD-10-CM

## 2024-01-27 ENCOUNTER — Ambulatory Visit (HOSPITAL_BASED_OUTPATIENT_CLINIC_OR_DEPARTMENT_OTHER): Admitting: Physical Therapy

## 2024-01-27 ENCOUNTER — Encounter (HOSPITAL_BASED_OUTPATIENT_CLINIC_OR_DEPARTMENT_OTHER): Payer: Self-pay | Admitting: Physical Therapy

## 2024-01-27 DIAGNOSIS — M25512 Pain in left shoulder: Secondary | ICD-10-CM

## 2024-01-27 DIAGNOSIS — M25551 Pain in right hip: Secondary | ICD-10-CM

## 2024-01-27 NOTE — Therapy (Signed)
 OUTPATIENT PHYSICAL THERAPY UPPER EXTREMITY TREATMENT   Patient Name: Tara [name] MRN: [id number] DOB:August 05, 1960, 63 y.o., female Today's Date: 01/27/2024  END OF SESSION:  PT End of Session - 01/27/24 1445     Visit Number 48    Number of Visits 51    Date for Recertification  02/06/24    PT Start Time 1445    PT Stop Time 1527    PT Time Calculation (min) 42 min    Activity Tolerance Patient tolerated treatment well    Behavior During Therapy [facility] for tasks assessed/performed                              Past Medical History:  Diagnosis Date   Allergy    Anemia    Arthritis    Hyperlipidemia    Insomnia    Mitral valve prolapse    PONV (postoperative nausea and vomiting)    Past Surgical History:  Procedure Laterality Date   AUGMENTATION MAMMAPLASTY Bilateral    Dec 2019   BREAST BIOPSY Right    axillary node benign   EYE SURGERY  2010   Lasik for distance correction; use one contact in left eye for reading now   JOINT REPLACEMENT  2021; 2022; 2025   reverse shoulder on the right; anatomic replacement on the left then conversion to reverse replacement due to failed anatomic   KNEE SURGERY  1988   right   SHOULDER ARTHROSCOPY WITH LABRAL REPAIR Right 08/28/2012   Procedure: RIGHT SHOULDER ARTHROSCOPY WITH LABRAL DEBRIDEMENT, ASPIRATION AND DECOMPRESSION OF PARALABRAL CYST;  Surgeon: Franky CHRISTELLA Pointer, MD;  Location: [facility] OR;  Service: Orthopedics;  Laterality: Right;   TOTAL SHOULDER ARTHROPLASTY Right 03/24/2020   Procedure: RIGHT REVERSE SHOULDER ARTHROPLASTY;  Surgeon: Pointer Franky, MD;  Location: [facility];  Service: Orthopedics;  Laterality: Right;    TOTAL SHOULDER ARTHROPLASTY Left 02/23/2021   Procedure: TOTAL SHOULDER ARTHROPLASTY;  Surgeon: Pointer Franky, MD;  Location: [facility];  Service: Orthopedics;  Laterality: Left;   TOTAL SHOULDER REVISION Left 02/07/2023   Procedure: Excision Left shoulder nodule; Conversion of left  anatomic shoulder arthroplasty to reverse shoulder arthroplasty;  Surgeon: Pointer Franky, MD;  Location: [facility];  Service: Orthopedics;  Laterality: Left;   Patient Active Problem List   Diagnosis Date Noted   B12 deficiency 09/03/2023   Onychomycosis 05/10/2023   Hypercalciuria 02/02/2023   DDD (degenerative disc disease), cervical 04/13/2021   Osteoporosis without current pathological fracture 10/28/2020   Nonallopathic lesion of sacral region 02/26/2020   Hot flashes 02/23/2019   Other insomnia 02/23/2019   Cystic acne vulgaris 02/23/2019   Bunion of right foot 02/23/2019   Varicose 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 02/23/2019   Multiple atypical nevi 02/23/2019   Mitral valve prolapse 03/03/2009    PCP: Roxan [name]   REFERRING PROVIDER: JONETTA Franky Supple (shoulder) Arthea Sharps, DO (hip)  REFERRING DIAG:  Diagnosis  [phone] (ICD-10-CM) - Presence of left artificial shoulder joint  Pain right hip  THERAPY DIAG:  Acute pain of left shoulder  Pain in right hip  Rationale for Evaluation and Treatment: Rehabilitation  ONSET DATE:  DOS: 02/07/2023  SUBJECTIVE:  SUBJECTIVE STATEMENT: Still localized to just medial to ischial tuberosity on the left. It spreads to the hip. It hurts the most as I return to stand from a squat but hip hinge is much better.    From Initial Evaluation: Patient had a left standard total shoulder replacement last year.  She did well for about 6 months then began having progressive pain in the shoulder.  She had to have a shoulder revision with a reverse total shoulder.  At this time her pain is well-controlled.  She is also had a right shoulder replaced.  When she had that replaced she had significant pain.  She reports this 1 is better.  She is wearing her  sling outside the house and when sleeping.  She is otherwise has been wearing her sling.  She has increased pain when she moves her arm.  She has been compliant with her HEP so far. Hand dominance: Right  PERTINENT HISTORY: Multijoint arthritis including knees and hips.  Osteoporosis, right shoulder replacement, mitral valve prolapse  PAIN:  Are you having pain? Yes: NPRS scale: excruciating/10  Pain location: Lt ischial tuberosity Pain description: burning Aggravating factors: sitting Relieving factors: movement  PRECAUTIONS: Shoulder  RED FLAGS: None   WEIGHT BEARING RESTRICTIONS: No  FALLS:  Has patient fallen in last 6 months? No  LIVING ENVIRONMENT:   OCCUPATION: Nurse   PLOF: Independent  PATIENT GOALS:  Patient wants to be functional   OBJECTIVE:  Note: Objective measures were completed at Evaluation unless otherwise noted. IMAGING:  MRI Rt hip 8/21 IMPRESSION: 1. Mild right hip osteoarthritis. 2. Bilateral hamstring and gluteal tendinosis. 3. Mild peritrochanteric bursitis bilaterally. 4. Small volume free fluid within the pelvis, nonspecific.  MRI lumbar 8/12 IMPRESSION: 1. Mild canal stenosis at L3-L4. 2. Moderate left and mild right foraminal stenosis at L5-S1 secondary to disc bulging and facet arthropathy.   PATIENT SURVEYS :  UEFI 40/80 UEFI 01/11/24 45/80   UPPER EXTREMITY ROM:   Passive ROM Right eval Left eval Left 1/21 L  L  Shoulder flexion  85 106 130AROM 160PROM  148AROM supine 158PROM  Shoulder extension       Shoulder abduction     112 active supine  Shoulder adduction       Shoulder internal rotation  Can sit on body comfortably   L gute  65/L iliac crest but can get to L4 with some compensation  Shoulder external rotation  20 26  50/T3  Elbow flexion       Elbow extension       Wrist flexion       Wrist extension       Wrist ulnar deviation       Wrist radial deviation       Wrist pronation       Wrist supination        (Blank rows = not tested)  UPPER EXTREMITY MMT:  MMT Right 3/25 Left 3/25 Lt 4/22 Rt/Lt 9/25 Rt/Lt 11/22  Shoulder flexion 15.4 8.2 9.5 24.4/16.6 20.3/16.2  Shoulder extension       Shoulder abduction 9.9 6.8 7.3 20.6/16.7 13.8/12.8  Shoulder adduction       Shoulder internal rotation 11.3 8.8 6.4 15.4/11.9 8.6/6.3  Shoulder external rotation 11.5 8.1 7.8 15.3/12.5 7.2/8.3  (Blank rows = not tested)    8/21- did not take strength tests for LEs due to high levels of pain  MMT Right 9/25 Left 9/25  Hip flexion 31.8 29.6  Hip extension  Hip abduction 36.5 36.9  Hip adduction    Hip internal rotation    Hip external rotation    Knee flexion 25.5 26.9  Knee extension 48.1 47.1   (Blank rows = not tested)    PALPATION:  No unexpected TTP    LUMBAR SCREEN 7/3:  Negative change in standing extension, increased pain in flexion  Negative for change in prone on elbows static or dynamic  Negative for change with lumbar mobilizations  Negative SLR passive, positive for concordant pain in active SLR  Tightness in hamstrings & lateral hip  Negative concordant pain in resisted hamstring MMT or hip ext MMT  Negative concordant pain in sidelying hip abd (but is weak), proximal HS pain with sidelying hip abd+hip flexion  Negative cocordant pain with supine march bent knee, positive concordant pain active SLR on Rt (no change with movement of left leg)  Decreased concordant pain for a short amount of time following IASTM to hamstrings & STM to glut med/min                                                                                                                            TREATMENT DATE:  01/27/24  Trigger Point Dry Needling  Subsequent Treatment: Instructions provided previously at initial dry needling treatment.   Patient Verbal Consent Given: Yes Education Handout Provided: Previously Provided Muscles Treated: Lt peroneals, lateral HS, adductors Electrical  Stimulation Performed: No Treatment Response/Outcome: able to squat with venu valgus greater than 90 deg with less discomfort  US -8 min 3.3 MHz, 1.0 w/cm2- Lt ischial tuberosity  Squat with self lateral pull on ischial tuberosities and knock kneed position  01/20/24 Qped rock back to wall- cues for right hip back Tall kneel hip hinge, feet against wall Tall kneel eccentric hamstring hinge forward, feet on wall Bridge on wall with adduction squeeze- cues for hamstrings Half kneel- pair breathing with HS drag- full rib cage expansion, stack over bottom knee.  US -8 min 3.3 MHz, 1.0 w/cm2- Lt ischial tuberosity  01/14/24 Ultrasound- 8 min 3.3 MHz, 1.0 w/cm2- Lt sacral border & Lt ischial tuberosity Seated quadratus femoris mob with movement- hip hinge Supine legs up wall- SLR with core activation Double leg lift from wall- abduct & return Supine scar mobilization Lt shoulder  01/11/24 Ultrasound- 8 min 3.3 MHz, 1.0 w/cm2- Lt sacral border & Lt ischial tuberosity MMT & UEFI for Lt GHJ testing Standing postural alignment Standing forward flexion with posture awareness- alternating on and off of airex with toes off the front, began with 1lb & progressed to 4. Eccentric lowering below 90 deg resulted in compensation via deltoid and biceps popping- used row motion for strength and proprioception training for now.     12/24/23 Ultrasound- 8 min 3.3 MHz, 1.0 w/cm2- Rt sacral border & Rt ischial tuberosity Able to do child pose and forward hinge in standing without onset of pain following US  today.  Review of resting posture- pelvis stacked and  TA engaged  Added UE ER, forward reach/serve, overhead reach  12/16/23 Ultrasound- 8 min 3.3 MHz, 1.0 w/cm2- Rt sacral border Prone hip ext knee flexed- trunk on table, legs off Standing postural alignment- suggested laser level use at home      PATIENT EDUCATION: Education details:  HEP, exercise form, rationale of interventions, protocol  restrictions and limitations, protocol ranges, and ROM findings Person educated: Patient Education method: Explanation, Demonstration, Tactile cues, Verbal cues Education comprehension: verbalized understanding, returned demonstration, verbal cues required, tactile cues required, and needs further education  HOME EXERCISE PROGRAM: Access Code: [email or web address] URL: [email or web address]   ASSESSMENT:  CLINICAL IMPRESSION: Concordant tightness released along lateral chain from hip to ankle as well as adductors to reduce tightness into femoral IR- able to decrease tightness and will continue to perform regular squats with ischial tuberosity pull throughout the day.   OBJECTIVE IMPAIRMENTS:  decreased ROM, decreased strength, impaired UE functional use, and pain.   ACTIVITY LIMITATIONS: carrying, lifting, sleeping, bathing, toileting, dressing, self feeding, reach over head, and hygiene/grooming  PARTICIPATION LIMITATIONS: meal prep, cleaning, laundry, driving, shopping, community activity, occupation, and yard work  PERSONAL FACTORS: 1-2 comorbidities: previous replacement on the same shoulder osteoporosis; multi joint OA  are also affecting patient's functional outcome.   REHAB POTENTIAL: Good  CLINICAL DECISION MAKING: Stable/uncomplicated  EVALUATION COMPLEXITY: Low  GOALS: Goals reviewed with patient? Yes  SHORT TERM GOALS: Target date: 04/16/2023    Patient will increase passive flexion to 115 degrees  Baseline: Goal status: MET  2.  Patient will increase left shoulder  ER to 30 degrees  Baseline:  Goal status: MET  3.  Patient will use her left arm for ADL's below 90 degrees with > 2/10 pain  Baseline:  Goal status: MET   LONG TERM GOALS: Target date: POC DATE    The patient will reach overhead with left arm without pain in order to perform ADL's  Baseline:  Goal status: MET  2.  Patient will each behind her head without pain in order to per from her  ADL's  Baseline:  Goal status: MET  3.  Patient will reach behind her back to L3 without pain in order to perform ADL's  Baseline:  Goal status: MET  4. Able to demo overhead activity without scapular winging bilaterally  Status: MET 5. Strength available for ADLs with weight/resistance such as washing dishes, vacuuming   Status: ongoing- has met for shoulder but limited by hip/back/buttock pain 6. Feel prepared to lift grand children when I have them.   Status: New 7. Able to sit for at least 10 minutes without needing to stand due to pain  Baseline: activated it hurts immediately, but if I am not I may be able to make it 5 minutes now!  Status: ongoing 8. Demonstrate proper core activation in exercises for lumbopelvic support  Status: New   PLAN: PT FREQUENCY: 2x/week  PT DURATION: 8 weeks  PLANNED INTERVENTIONS: 97110-Therapeutic exercises, 97530- Therapeutic activity, [id number]- Neuromuscular re-education, 97535- Self Care, [id number]- Manual therapy, 7113- Aquatic Therapy, [id number]- Electrical stimulation (unattended), 97035- Ultrasound, Patient/Family education, Taping, Dry Needling, DME instructions, Cryotherapy, and Moist heat   PLAN FOR NEXT SESSION:    Review core contraction.    Karnell Vanderloop C. Enola Siebers PT, DPT 01/27/24 3:31 PM

## 2024-01-28 ENCOUNTER — Encounter (HOSPITAL_BASED_OUTPATIENT_CLINIC_OR_DEPARTMENT_OTHER)

## 2024-01-29 DIAGNOSIS — I872 Venous insufficiency (chronic) (peripheral): Secondary | ICD-10-CM | POA: Diagnosis not present

## 2024-01-29 DIAGNOSIS — I87393 Chronic venous hypertension (idiopathic) with other complications of bilateral lower extremity: Secondary | ICD-10-CM | POA: Diagnosis not present

## 2024-01-29 DIAGNOSIS — R252 Cramp and spasm: Secondary | ICD-10-CM | POA: Diagnosis not present

## 2024-01-29 DIAGNOSIS — M79605 Pain in left leg: Secondary | ICD-10-CM | POA: Diagnosis not present

## 2024-01-29 DIAGNOSIS — R600 Localized edema: Secondary | ICD-10-CM | POA: Diagnosis not present

## 2024-02-03 ENCOUNTER — Ambulatory Visit (HOSPITAL_BASED_OUTPATIENT_CLINIC_OR_DEPARTMENT_OTHER): Admitting: Physical Therapy

## 2024-02-04 ENCOUNTER — Encounter (HOSPITAL_BASED_OUTPATIENT_CLINIC_OR_DEPARTMENT_OTHER): Admitting: Physical Therapy

## 2024-02-07 ENCOUNTER — Ambulatory Visit: Admitting: Podiatry

## 2024-02-07 DIAGNOSIS — L84 Corns and callosities: Secondary | ICD-10-CM

## 2024-02-07 DIAGNOSIS — L603 Nail dystrophy: Secondary | ICD-10-CM | POA: Diagnosis not present

## 2024-02-11 ENCOUNTER — Encounter (HOSPITAL_BASED_OUTPATIENT_CLINIC_OR_DEPARTMENT_OTHER): Admitting: Physical Therapy

## 2024-02-11 NOTE — Progress Notes (Signed)
 Subjective: Chief Complaint  Patient presents with   Nail Problem    F/U NAIL FUNGUS      63 year old female presents the office today with above concerns.  States that she still can use the [place]  on the nails.  She also gets calluses that she is tries to file down on a very regular basis.  She does not recall any open lesions.  No recent injuries or changes otherwise.  Objective: AAO x3, NAD DP/PT pulses palpable bilaterally, CRT less than 3 seconds Hammertoes are present as well as mild bunion.  Hyperkeratotic lesions on the medial hallux, first MTPJ bilaterally without any underlying ulceration drainage or signs of infection.  Nails are mildly hypertrophic, dystrophic with yellow-brown discoloration.   No edema, erythema or signs of infection.  No pain with calf compression, swelling, warmth, erythema  Assessment: Hyperkeratotic lesions, hammertoe/bunion deformity, onychomycosis  Plan: -All treatment options discussed with the patient including all alternatives, risks, complications.  -As a courtesy debride the calluses without any complications or bleeding Continue moisturizer, offloading.  She has a dry skin as well on the heels and we discussed a foot peel to try to help as well or exfoliating.  Continue shoes, good arch support.  Unfortunately given the bunion, foot structure this is contributing to the callus. -Discussed topical medication to help with nail dystrophy. Debrided nails without any complications or bleeding.  Tara [name] DPM

## 2024-02-15 ENCOUNTER — Encounter (HOSPITAL_BASED_OUTPATIENT_CLINIC_OR_DEPARTMENT_OTHER): Payer: Self-pay | Admitting: Physical Therapy

## 2024-02-15 ENCOUNTER — Ambulatory Visit (HOSPITAL_BASED_OUTPATIENT_CLINIC_OR_DEPARTMENT_OTHER): Admitting: Physical Therapy

## 2024-02-15 DIAGNOSIS — M25551 Pain in right hip: Secondary | ICD-10-CM

## 2024-02-15 DIAGNOSIS — M25612 Stiffness of left shoulder, not elsewhere classified: Secondary | ICD-10-CM

## 2024-02-15 DIAGNOSIS — M25512 Pain in left shoulder: Secondary | ICD-10-CM

## 2024-02-15 NOTE — Therapy (Signed)
 " OUTPATIENT PHYSICAL THERAPY UPPER EXTREMITY TREATMENT   Patient Name: Tara [name] MRN: [id number] DOB:09-04-1960, 63 y.o., female Today's Date: 02/15/2024  END OF SESSION:  PT End of Session - 02/15/24 0833     Visit Number 49    Number of Visits --    Date for Recertification  05/16/24    PT Start Time 0831    PT Stop Time 0925    PT Time Calculation (min) 54 min    Activity Tolerance Patient tolerated treatment well    Behavior During Therapy [facility] for tasks assessed/performed                              Past Medical History:  Diagnosis Date   Allergy    Anemia    Arthritis    Hyperlipidemia    Insomnia    Mitral valve prolapse    PONV (postoperative nausea and vomiting)    Past Surgical History:  Procedure Laterality Date   AUGMENTATION MAMMAPLASTY Bilateral    Dec 2019   BREAST BIOPSY Right    axillary node benign   EYE SURGERY  2010   Lasik for distance correction; use one contact in left eye for reading now   JOINT REPLACEMENT  2021; 2022; 2025   reverse shoulder on the right; anatomic replacement on the left then conversion to reverse replacement due to failed anatomic   KNEE SURGERY  1988   right   SHOULDER ARTHROSCOPY WITH LABRAL REPAIR Right 08/28/2012   Procedure: RIGHT SHOULDER ARTHROSCOPY WITH LABRAL DEBRIDEMENT, ASPIRATION AND DECOMPRESSION OF PARALABRAL CYST;  Surgeon: Franky CHRISTELLA Pointer, MD;  Location: [facility];  Service: Orthopedics;  Laterality: Right;   TOTAL SHOULDER ARTHROPLASTY Right 03/24/2020   Procedure: RIGHT REVERSE SHOULDER ARTHROPLASTY;  Surgeon: Pointer Franky, MD;  Location: [facility];  Service: Orthopedics;  Laterality: Right;    TOTAL SHOULDER ARTHROPLASTY Left 02/23/2021   Procedure: TOTAL SHOULDER ARTHROPLASTY;  Surgeon: Pointer Franky, MD;  Location: [facility];  Service: Orthopedics;  Laterality: Left;   TOTAL SHOULDER REVISION Left 02/07/2023   Procedure: Excision Left shoulder nodule; Conversion of left  anatomic shoulder arthroplasty to reverse shoulder arthroplasty;  Surgeon: Pointer Franky, MD;  Location: [facility];  Service: Orthopedics;  Laterality: Left;   Patient Active Problem List   Diagnosis Date Noted   B12 deficiency 09/03/2023   Onychomycosis 05/10/2023   Hypercalciuria 02/02/2023   DDD (degenerative disc disease), cervical 04/13/2021   Osteoporosis without current pathological fracture 10/28/2020   Nonallopathic lesion of sacral region 02/26/2020   Hot flashes 02/23/2019   Other insomnia 02/23/2019   Cystic acne vulgaris 02/23/2019   Bunion of right foot 02/23/2019   Varicose 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 02/23/2019   Multiple atypical nevi 02/23/2019   Mitral valve prolapse 03/03/2009    PCP: Tara [name]   REFERRING PROVIDER: JONETTA Franky Supple (shoulder) Arthea Sharps, DO (hip)  REFERRING DIAG:  Diagnosis  [phone] (ICD-10-CM) - Presence of left artificial shoulder joint  Pain right hip  THERAPY DIAG:  Acute pain of left shoulder  Pain in right hip  Stiffness of left shoulder, not elsewhere classified  Rationale for Evaluation and Treatment: Rehabilitation  ONSET DATE:  DOS: 02/07/2023  SUBJECTIVE:  SUBJECTIVE STATEMENT: Still needs focus on the left lower from the injury. Rt side will start to hurt and feels some numbness in what seems to be the 3rd & 4th toe. Lt is still localized at ischial tuberosity. Now when I sit, the pain feels like it is moving to the hip and down to quads.    From Initial Evaluation: Patient had a left standard total shoulder replacement last year.  She did well for about 6 months then began having progressive pain in the shoulder.  She had to have a shoulder revision with a reverse total shoulder.  At this time her pain is  well-controlled.  She is also had a right shoulder replaced.  When she had that replaced she had significant pain.  She reports this 1 is better.  She is wearing her sling outside the house and when sleeping.  She is otherwise has been wearing her sling.  She has increased pain when she moves her arm.  She has been compliant with her HEP so far. Hand dominance: Right  PERTINENT HISTORY: Multijoint arthritis including knees and hips.  Osteoporosis, right shoulder replacement, mitral valve prolapse  PAIN:  Are you having pain? Yes: NPRS scale: excruciating/10  Pain location: Lt ischial tuberosity Pain description: burning Aggravating factors: sitting Relieving factors: movement  PRECAUTIONS: Shoulder  RED FLAGS: None   WEIGHT BEARING RESTRICTIONS: No  FALLS:  Has patient fallen in last 6 months? No  LIVING ENVIRONMENT:   OCCUPATION: Nurse   PLOF: Independent  PATIENT GOALS:  Patient wants to be functional   OBJECTIVE:  Note: Objective measures were completed at Evaluation unless otherwise noted. IMAGING:  MRI Rt hip 8/21 IMPRESSION: 1. Mild right hip osteoarthritis. 2. Bilateral hamstring and gluteal tendinosis. 3. Mild peritrochanteric bursitis bilaterally. 4. Small volume free fluid within the pelvis, nonspecific.  MRI lumbar 8/12 IMPRESSION: 1. Mild canal stenosis at L3-L4. 2. Moderate left and mild right foraminal stenosis at L5-S1 secondary to disc bulging and facet arthropathy.   PATIENT SURVEYS :  UEFI 40/80 UEFI 01/11/24 45/80   UPPER EXTREMITY ROM:   Passive ROM Right eval Left eval Left 1/21 L  L  Shoulder flexion  85 106 130AROM 160PROM  148AROM supine 158PROM  Shoulder extension       Shoulder abduction     112 active supine  Shoulder adduction       Shoulder internal rotation  Can sit on body comfortably   L gute  65/L iliac crest but can get to L4 with some compensation  Shoulder external rotation  20 26  50/T3  Elbow flexion        Elbow extension       Wrist flexion       Wrist extension       Wrist ulnar deviation       Wrist radial deviation       Wrist pronation       Wrist supination       (Blank rows = not tested)  UPPER EXTREMITY MMT:  MMT Right 3/25 Left 3/25 Lt 4/22 Rt/Lt 9/25 Rt/Lt 11/22  Shoulder flexion 15.4 8.2 9.5 24.4/16.6 20.3/16.2  Shoulder extension       Shoulder abduction 9.9 6.8 7.3 20.6/16.7 13.8/12.8  Shoulder adduction       Shoulder internal rotation 11.3 8.8 6.4 15.4/11.9 8.6/6.3  Shoulder external rotation 11.5 8.1 7.8 15.3/12.5 7.2/8.3  (Blank rows = not tested)    8/21- did not take strength tests for LEs  due to high levels of pain  MMT Right 9/25 Left 9/25  Hip flexion 31.8 29.6  Hip extension    Hip abduction 36.5 36.9  Hip adduction    Hip internal rotation    Hip external rotation    Knee flexion 25.5 26.9  Knee extension 48.1 47.1   (Blank rows = not tested)    PALPATION:  No unexpected TTP    LUMBAR SCREEN 7/3:  Negative change in standing extension, increased pain in flexion  Negative for change in prone on elbows static or dynamic  Negative for change with lumbar mobilizations  Negative SLR passive, positive for concordant pain in active SLR  Tightness in hamstrings & lateral hip  Negative concordant pain in resisted hamstring MMT or hip ext MMT  Negative concordant pain in sidelying hip abd (but is weak), proximal HS pain with sidelying hip abd+hip flexion  Negative cocordant pain with supine march bent knee, positive concordant pain active SLR on Rt (no change with movement of left leg)  Decreased concordant pain for a short amount of time following IASTM to hamstrings & STM to glut med/min                                                                                                                            TREATMENT DATE:  Treatment                            12/27: Blank lines following charge title = not provided on this treatment date.    Manual:  TPDN YES Trigger Point Dry Needling  Subsequent Treatment: Instructions provided previously at initial dry needling treatment.   Patient Verbal Consent Given: Yes Education Handout Provided: Previously Provided Muscles Treated: Lt lumbar paraspinals, Lt hamstrings, Lt adductors Electrical Stimulation Performed: No Treatment Response/Outcome: decreased Lt lumbar hypertrophy, decreased spasm through hamstrings and adductors.   There-ex:  There-Act:  Self Care:  Nuro-Re-ed: Gait- trunk rotation via obliques, activation of glut med in Lt stance phase.  Lt SLS with right hip hike + Rt hip flexion and Lt trunk rotation via obliques Gait Training:    01/27/24  Trigger Point Dry Needling  Subsequent Treatment: Instructions provided previously at initial dry needling treatment.   Patient Verbal Consent Given: Yes Education Handout Provided: Previously Provided Muscles Treated: Lt peroneals, lateral HS, adductors Electrical Stimulation Performed: No Treatment Response/Outcome: able to squat with venu valgus greater than 90 deg with less discomfort  US -8 min 3.3 MHz, 1.0 w/cm2- Lt ischial tuberosity  Squat with self lateral pull on ischial tuberosities and knock kneed position  01/20/24 Qped rock back to wall- cues for right hip back Tall kneel hip hinge, feet against wall Tall kneel eccentric hamstring hinge forward, feet on wall Bridge on wall with adduction squeeze- cues for hamstrings Half kneel- pair breathing with HS drag- full rib cage expansion, stack over bottom knee.  US -8 min 3.3 MHz,  1.0 w/cm2- Lt ischial tuberosity  01/14/24 Ultrasound- 8 min 3.3 MHz, 1.0 w/cm2- Lt sacral border & Lt ischial tuberosity Seated quadratus femoris mob with movement- hip hinge Supine legs up wall- SLR with core activation Double leg lift from wall- abduct & return Supine scar mobilization Lt shoulder  01/11/24 Ultrasound- 8 min 3.3 MHz, 1.0 w/cm2- Lt sacral border & Lt  ischial tuberosity MMT & UEFI for Lt GHJ testing Standing postural alignment Standing forward flexion with posture awareness- alternating on and off of airex with toes off the front, began with 1lb & progressed to 4. Eccentric lowering below 90 deg resulted in compensation via deltoid and biceps popping- used row motion for strength and proprioception training for now.     12/24/23 Ultrasound- 8 min 3.3 MHz, 1.0 w/cm2- Rt sacral border & Rt ischial tuberosity Able to do child pose and forward hinge in standing without onset of pain following US  today.  Review of resting posture- pelvis stacked and TA engaged  Added UE ER, forward reach/serve, overhead reach  12/16/23 Ultrasound- 8 min 3.3 MHz, 1.0 w/cm2- Rt sacral border Prone hip ext knee flexed- trunk on table, legs off Standing postural alignment- suggested laser level use at home      PATIENT EDUCATION: Education details:  HEP, exercise form, rationale of interventions, protocol restrictions and limitations, protocol ranges, and ROM findings Person educated: Patient Education method: Explanation, Demonstration, Tactile cues, Verbal cues Education comprehension: verbalized understanding, returned demonstration, verbal cues required, tactile cues required, and needs further education  HOME EXERCISE PROGRAM: Access Code: [email or web address] URL: [email or web address]   ASSESSMENT:  CLINICAL IMPRESSION: Pt arrived with new numbness into Rt foot that is unchanged by nerve tension testing or positioning. Pt reports spending time prior to appt to warm up and stretch which may have contributed to pain upon arrival and is still very cognizant of form when bending/stooping to use hip hinge- demo proper form today. Pain continues to be centralized to ischial tuberosity. Lacking trunk rotation and arm swing in gait contributing to shoulder tension. Noted poor Lt glut med activation creating trendelenburg that was more obvious with  attempted oblique activation in gait. Endocrinology notes indicate that pt has significant OP but is slightly improved from 3 years ago and idopathic hyercalciuria. S/s do not point toward fx at this time but will continue to be mindful. Will extend POC to continue addressing pt concerns as she has progressed over time but felt like 3 weeks without an appointment was detrimental to her progression.   OBJECTIVE IMPAIRMENTS:  decreased ROM, decreased strength, impaired UE functional use, and pain.   ACTIVITY LIMITATIONS: carrying, lifting, sleeping, bathing, toileting, dressing, self feeding, reach over head, and hygiene/grooming  PARTICIPATION LIMITATIONS: meal prep, cleaning, laundry, driving, shopping, community activity, occupation, and yard work  PERSONAL FACTORS: 1-2 comorbidities: previous replacement on the same shoulder osteoporosis; multi joint OA  are also affecting patient's functional outcome.   REHAB POTENTIAL: Good  CLINICAL DECISION MAKING: Stable/uncomplicated  EVALUATION COMPLEXITY: Low  GOALS: Goals reviewed with patient? Yes  SHORT TERM GOALS: Target date: 04/16/2023    Patient will increase passive flexion to 115 degrees  Baseline: Goal status: MET  2.  Patient will increase left shoulder  ER to 30 degrees  Baseline:  Goal status: MET  3.  Patient will use her left arm for ADL's below 90 degrees with > 2/10 pain  Baseline:  Goal status: MET   LONG TERM GOALS: Target date: POC DATE    The patient  will reach overhead with left arm without pain in order to perform ADL's  Baseline:  Goal status: MET  2.  Patient will each behind her head without pain in order to per from her ADL's  Baseline:  Goal status: MET  3.  Patient will reach behind her back to L3 without pain in order to perform ADL's  Baseline:  Goal status: MET  4. Able to demo overhead activity without scapular winging bilaterally  Status: MET 5. Strength available for ADLs with  weight/resistance such as washing dishes, vacuuming   Status: ongoing- has met for shoulder but limited by hip/back/buttock pain 6. Feel prepared to lift grand children when I have them.   Status: New 7. Able to sit for at least 10 minutes without needing to stand due to pain  Baseline: activated it hurts immediately, but if I am not I may be able to make it 5 minutes now!  Status: ongoing 8. Demonstrate proper core activation in exercises for lumbopelvic support  Status: New   PLAN: PT FREQUENCY: 2x/week  PT DURATION: 8 weeks  PLANNED INTERVENTIONS: 97110-Therapeutic exercises, 97530- Therapeutic activity, [id number]- Neuromuscular re-education, 97535- Self Care, [id number]- Manual therapy, 7113- Aquatic Therapy, [id number]- Electrical stimulation (unattended), 97035- Ultrasound, Patient/Family education, Taping, Dry Needling, DME instructions, Cryotherapy, and Moist heat   PLAN FOR NEXT SESSION:    Review core contraction.    Alvis Edgell C. Kaeson Kleinert PT, DPT 02/15/2024 12:03 PM       "

## 2024-02-17 ENCOUNTER — Other Ambulatory Visit: Payer: Self-pay

## 2024-02-17 DIAGNOSIS — G8929 Other chronic pain: Secondary | ICD-10-CM

## 2024-02-18 ENCOUNTER — Encounter (HOSPITAL_BASED_OUTPATIENT_CLINIC_OR_DEPARTMENT_OTHER): Admitting: Physical Therapy

## 2024-02-19 ENCOUNTER — Ambulatory Visit (HOSPITAL_BASED_OUTPATIENT_CLINIC_OR_DEPARTMENT_OTHER): Admitting: Physical Therapy

## 2024-02-19 ENCOUNTER — Encounter (HOSPITAL_BASED_OUTPATIENT_CLINIC_OR_DEPARTMENT_OTHER): Payer: Self-pay | Admitting: Physical Therapy

## 2024-02-19 DIAGNOSIS — M25612 Stiffness of left shoulder, not elsewhere classified: Secondary | ICD-10-CM

## 2024-02-19 DIAGNOSIS — M25512 Pain in left shoulder: Secondary | ICD-10-CM

## 2024-02-19 DIAGNOSIS — M25551 Pain in right hip: Secondary | ICD-10-CM

## 2024-02-19 NOTE — Therapy (Signed)
 " OUTPATIENT PHYSICAL THERAPY UPPER EXTREMITY TREATMENT   Patient Name: Tara [name] MRN: [id number] DOB:February 22, 1960, 63 y.o., female Today's Date: 02/19/2024  END OF SESSION:  PT End of Session - 02/19/24 0913     Visit Number 50    Date for Recertification  05/16/24    PT Start Time 0913    PT Stop Time 1000    PT Time Calculation (min) 47 min    Activity Tolerance Patient tolerated treatment well    Behavior During Therapy [facility] for tasks assessed/performed                              Past Medical History:  Diagnosis Date   Allergy    Anemia    Arthritis    Hyperlipidemia    Insomnia    Mitral valve prolapse    PONV (postoperative nausea and vomiting)    Past Surgical History:  Procedure Laterality Date   AUGMENTATION MAMMAPLASTY Bilateral    Dec 2019   BREAST BIOPSY Right    axillary node benign   EYE SURGERY  2010   Lasik for distance correction; use one contact in left eye for reading now   JOINT REPLACEMENT  2021; 2022; 2025   reverse shoulder on the right; anatomic replacement on the left then conversion to reverse replacement due to failed anatomic   KNEE SURGERY  1988   right   SHOULDER ARTHROSCOPY WITH LABRAL REPAIR Right 08/28/2012   Procedure: RIGHT SHOULDER ARTHROSCOPY WITH LABRAL DEBRIDEMENT, ASPIRATION AND DECOMPRESSION OF PARALABRAL CYST;  Surgeon: Franky CHRISTELLA Pointer, MD;  Location: [facility];  Service: Orthopedics;  Laterality: Right;   TOTAL SHOULDER ARTHROPLASTY Right 03/24/2020   Procedure: RIGHT REVERSE SHOULDER ARTHROPLASTY;  Surgeon: Pointer Franky, MD;  Location: [facility];  Service: Orthopedics;  Laterality: Right;    TOTAL SHOULDER ARTHROPLASTY Left 02/23/2021   Procedure: TOTAL SHOULDER ARTHROPLASTY;  Surgeon: Pointer Franky, MD;  Location: [facility];  Service: Orthopedics;  Laterality: Left;   TOTAL SHOULDER REVISION Left 02/07/2023   Procedure: Excision Left shoulder nodule; Conversion of left anatomic shoulder  arthroplasty to reverse shoulder arthroplasty;  Surgeon: Pointer Franky, MD;  Location: [facility];  Service: Orthopedics;  Laterality: Left;   Patient Active Problem List   Diagnosis Date Noted   B12 deficiency 09/03/2023   Onychomycosis 05/10/2023   Hypercalciuria 02/02/2023   DDD (degenerative disc disease), cervical 04/13/2021   Osteoporosis without current pathological fracture 10/28/2020   Nonallopathic lesion of sacral region 02/26/2020   Hot flashes 02/23/2019   Other insomnia 02/23/2019   Cystic acne vulgaris 02/23/2019   Bunion of right foot 02/23/2019   Varicose 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 02/23/2019   Multiple atypical nevi 02/23/2019   Mitral valve prolapse 03/03/2009    PCP: Roxan [name]   REFERRING PROVIDER: JONETTA Franky Supple (shoulder) Arthea Sharps, DO (hip)  REFERRING DIAG:  Diagnosis  [id number] (ICD-10-CM) - Presence of left artificial shoulder joint  Pain right hip  THERAPY DIAG:  Acute pain of left shoulder  Pain in right hip  Stiffness of left shoulder, not elsewhere classified  Rationale for Evaluation and Treatment: Rehabilitation  ONSET DATE:  DOS: 02/07/2023  SUBJECTIVE:  SUBJECTIVE STATEMENT: Seated pain on left side is still excruciating- had to get out of car 2x on the way to [place]. Pain is localizing to SIJ in walking/standing but spreads in sitting.   From Initial Evaluation: Patient had a left standard total shoulder replacement last year.  She did well for about 6 months then began having progressive pain in the shoulder.  She had to have a shoulder revision with a reverse total shoulder.  At this time her pain is well-controlled.  She is also had a right shoulder replaced.  When she had that replaced she had significant pain.  She reports this  1 is better.  She is wearing her sling outside the house and when sleeping.  She is otherwise has been wearing her sling.  She has increased pain when she moves her arm.  She has been compliant with her HEP so far. Hand dominance: Right  PERTINENT HISTORY: Multijoint arthritis including knees and hips.  Osteoporosis, right shoulder replacement, mitral valve prolapse  PAIN:  Are you having pain? Yes: NPRS scale: excruciating/10  Pain location: Lt ischial tuberosity Pain description: burning Aggravating factors: sitting Relieving factors: movement  PRECAUTIONS: Shoulder  RED FLAGS: None   WEIGHT BEARING RESTRICTIONS: No  FALLS:  Has patient fallen in last 6 months? No  LIVING ENVIRONMENT:   OCCUPATION: Nurse   PLOF: Independent  PATIENT GOALS:  Patient wants to be functional   OBJECTIVE:  Note: Objective measures were completed at Evaluation unless otherwise noted. IMAGING:  MRI Rt hip 8/21 IMPRESSION: 1. Mild right hip osteoarthritis. 2. Bilateral hamstring and gluteal tendinosis. 3. Mild peritrochanteric bursitis bilaterally. 4. Small volume free fluid within the pelvis, nonspecific.  MRI lumbar 8/12 IMPRESSION: 1. Mild canal stenosis at L3-L4. 2. Moderate left and mild right foraminal stenosis at L5-S1 secondary to disc bulging and facet arthropathy.   PATIENT SURVEYS :  UEFI 40/80 UEFI 01/11/24 45/80   UPPER EXTREMITY ROM:   Passive ROM Right eval Left eval Left 1/21 L  L  Shoulder flexion  85 106 130AROM 160PROM  148AROM supine 158PROM  Shoulder extension       Shoulder abduction     112 active supine  Shoulder adduction       Shoulder internal rotation  Can sit on body comfortably   L gute  65/L iliac crest but can get to L4 with some compensation  Shoulder external rotation  20 26  50/T3  Elbow flexion       Elbow extension       Wrist flexion       Wrist extension       Wrist ulnar deviation       Wrist radial deviation       Wrist  pronation       Wrist supination       (Blank rows = not tested)  UPPER EXTREMITY MMT:  MMT Right 3/25 Left 3/25 Lt 4/22 Rt/Lt 9/25 Rt/Lt 11/22  Shoulder flexion 15.4 8.2 9.5 24.4/16.6 20.3/16.2  Shoulder extension       Shoulder abduction 9.9 6.8 7.3 20.6/16.7 13.8/12.8  Shoulder adduction       Shoulder internal rotation 11.3 8.8 6.4 15.4/11.9 8.6/6.3  Shoulder external rotation 11.5 8.1 7.8 15.3/12.5 7.2/8.3  (Blank rows = not tested)    8/21- did not take strength tests for LEs due to high levels of pain  MMT Right 9/25 Left 9/25 Rt/Lt 12/31  Hip flexion 31.8 29.6 43.4/42.2  Hip extension  Hip abduction 36.5 36.9 44.3/46.6  Hip adduction     Hip internal rotation     Hip external rotation     Knee flexion 25.5 26.9 25.8/30.1  Knee extension 48.1 47.1 41.5/53.8   (Blank rows = not tested)    PALPATION:  No unexpected TTP    LUMBAR SCREEN 7/3:  Negative change in standing extension, increased pain in flexion  Negative for change in prone on elbows static or dynamic  Negative for change with lumbar mobilizations  Negative SLR passive, positive for concordant pain in active SLR  Tightness in hamstrings & lateral hip  Negative concordant pain in resisted hamstring MMT or hip ext MMT  Negative concordant pain in sidelying hip abd (but is weak), proximal HS pain with sidelying hip abd+hip flexion  Negative cocordant pain with supine march bent knee, positive concordant pain active SLR on Rt (no change with movement of left leg)  Decreased concordant pain for a short amount of time following IASTM to hamstrings & STM to glut med/min                                                                                                                            TREATMENT DATE:  Treatment                            02/19/24: Blank lines following charge title = not provided on this treatment date.   Manual:  TPDN No MMT- pt reported bilateral Numbness into  toes There-ex: Prone position 2 min- distal sx continued Supine DKTC 2 min- distal sx continued Standing flexion/ext biastesting- no change in distal sx Seated trunk Left rotation/flexion/pulses Supine passive post tilt on towel roll + wedge There-Act:  Self Care:  Nuro-Re-ed: Standing posture- hip width BOS, Rt foot slightly back, Lt obliques Gait Training:    Treatment                            12/27: Blank lines following charge title = not provided on this treatment date.   Manual:  TPDN YES Trigger Point Dry Needling  Subsequent Treatment: Instructions provided previously at initial dry needling treatment.   Patient Verbal Consent Given: Yes Education Handout Provided: Previously Provided Muscles Treated: Lt lumbar paraspinals, Lt hamstrings, Lt adductors Electrical Stimulation Performed: No Treatment Response/Outcome: decreased Lt lumbar hypertrophy, decreased spasm through hamstrings and adductors.   There-ex:  There-Act:  Self Care:  Nuro-Re-ed: Gait- trunk rotation via obliques, activation of glut med in Lt stance phase.  Lt SLS with right hip hike + Rt hip flexion and Lt trunk rotation via obliques Gait Training:    01/27/24  Trigger Point Dry Needling  Subsequent Treatment: Instructions provided previously at initial dry needling treatment.   Patient Verbal Consent Given: Yes Education Handout Provided: Previously Provided Muscles Treated: Lt peroneals, lateral HS, adductors Electrical Stimulation Performed: No  Treatment Response/Outcome: able to squat with venu valgus greater than 90 deg with less discomfort  US -8 min 3.3 MHz, 1.0 w/cm2- Lt ischial tuberosity  Squat with self lateral pull on ischial tuberosities and knock kneed position  01/20/24 Qped rock back to wall- cues for right hip back Tall kneel hip hinge, feet against wall Tall kneel eccentric hamstring hinge forward, feet on wall Bridge on wall with adduction squeeze- cues for  hamstrings Half kneel- pair breathing with HS drag- full rib cage expansion, stack over bottom knee.  US -8 min 3.3 MHz, 1.0 w/cm2- Lt ischial tuberosity  01/14/24 Ultrasound- 8 min 3.3 MHz, 1.0 w/cm2- Lt sacral border & Lt ischial tuberosity Seated quadratus femoris mob with movement- hip hinge Supine legs up wall- SLR with core activation Double leg lift from wall- abduct & return Supine scar mobilization Lt shoulder       PATIENT EDUCATION: Education details:  HEP, exercise form, rationale of interventions, protocol restrictions and limitations, protocol ranges, and ROM findings Person educated: Patient Education method: Explanation, Demonstration, Tactile cues, Verbal cues Education comprehension: verbalized understanding, returned demonstration, verbal cues required, tactile cues required, and needs further education  HOME EXERCISE PROGRAM: Access Code: [email or web address] URL: [email or web address]   ASSESSMENT:  CLINICAL IMPRESSION: Revisited prone/supine and standing movement bias testing without change to distal symptoms. Soreness lingered longest in Lt adductors from TPDN and was further addressed with IASTM today. Notable limitation in Lt sidebend with hinge at T12/L1  vs continued curve through lumbar spine in Rt sidebend. Requested daily, 20-30s, seated rotational mobilization to Lt rotation+SB to gently improve mobility- no notable hypertrophy remaining in Lt lumbar paraspinals. Distal, bilateral N/T is concerning due to unchanging nature once it is brought on and will continue to monitor.  Requested standing posture with Rt foot slightly back to decrease Lt abd activaiton and increase Lt oblique activation for neutral posture through shoulders.   OBJECTIVE IMPAIRMENTS:  decreased ROM, decreased strength, impaired UE functional use, and pain.   ACTIVITY LIMITATIONS: carrying, lifting, sleeping, bathing, toileting, dressing, self feeding, reach over head, and  hygiene/grooming  PARTICIPATION LIMITATIONS: meal prep, cleaning, laundry, driving, shopping, community activity, occupation, and yard work  PERSONAL FACTORS: 1-2 comorbidities: previous replacement on the same shoulder osteoporosis; multi joint OA  are also affecting patient's functional outcome.   REHAB POTENTIAL: Good  CLINICAL DECISION MAKING: Stable/uncomplicated  EVALUATION COMPLEXITY: Low  GOALS: Goals reviewed with patient? Yes  SHORT TERM GOALS: Target date: 04/16/2023    Patient will increase passive flexion to 115 degrees  Baseline: Goal status: MET  2.  Patient will increase left shoulder  ER to 30 degrees  Baseline:  Goal status: MET  3.  Patient will use her left arm for ADL's below 90 degrees with > 2/10 pain  Baseline:  Goal status: MET   LONG TERM GOALS: Target date: POC DATE    The patient will reach overhead with left arm without pain in order to perform ADL's  Baseline:  Goal status: MET  2.  Patient will each behind her head without pain in order to per from her ADL's  Baseline:  Goal status: MET  3.  Patient will reach behind her back to L3 without pain in order to perform ADL's  Baseline:  Goal status: MET  4. Able to demo overhead activity without scapular winging bilaterally  Status: MET 5. Strength available for ADLs with weight/resistance such as washing dishes, vacuuming   Status: ongoing- has met for shoulder but limited by hip/back/buttock  pain 6. Feel prepared to lift grand children when I have them.   Status: New 7. Able to sit for at least 10 minutes without needing to stand due to pain  Baseline: activated it hurts immediately, but if I am not I may be able to make it 5 minutes now!  Status: ongoing 8. Demonstrate proper core activation in exercises for lumbopelvic support  Status: New   PLAN: PT FREQUENCY: 2x/week  PT DURATION: 8 weeks  PLANNED INTERVENTIONS: 97110-Therapeutic exercises, 97530- Therapeutic activity,  [id number]- Neuromuscular re-education, 97535- Self Care, [id number]- Manual therapy, 7113- Aquatic Therapy, [id number]- Electrical stimulation (unattended), 97035- Ultrasound, Patient/Family education, Taping, Dry Needling, DME instructions, Cryotherapy, and Moist heat   PLAN FOR NEXT SESSION:    Review core contraction.    Syla Devoss C. Peja Allender PT, DPT 02/19/2024 10:11 AM       "

## 2024-02-22 ENCOUNTER — Encounter (HOSPITAL_BASED_OUTPATIENT_CLINIC_OR_DEPARTMENT_OTHER): Admitting: Physical Therapy

## 2024-02-28 ENCOUNTER — Telehealth: Payer: Self-pay | Admitting: Family Medicine

## 2024-02-28 ENCOUNTER — Ambulatory Visit

## 2024-02-28 ENCOUNTER — Ambulatory Visit: Admitting: Family Medicine

## 2024-02-28 VITALS — BP 104/60 | HR 55 | Ht 70.0 in | Wt 126.0 lb

## 2024-02-28 DIAGNOSIS — M25511 Pain in right shoulder: Secondary | ICD-10-CM

## 2024-02-28 DIAGNOSIS — G8929 Other chronic pain: Secondary | ICD-10-CM

## 2024-02-28 DIAGNOSIS — Z9189 Other specified personal risk factors, not elsewhere classified: Secondary | ICD-10-CM | POA: Diagnosis not present

## 2024-02-28 DIAGNOSIS — W19XXXA Unspecified fall, initial encounter: Secondary | ICD-10-CM | POA: Insufficient documentation

## 2024-02-28 DIAGNOSIS — I341 Nonrheumatic mitral (valve) prolapse: Secondary | ICD-10-CM

## 2024-02-28 DIAGNOSIS — M5030 Other cervical disc degeneration, unspecified cervical region: Secondary | ICD-10-CM

## 2024-02-28 NOTE — Progress Notes (Signed)
 " [name] [address] [place] [address] Phone: [phone] Subjective:   Tara [name], am serving as a scribe for Dr. Arthea Claudene.  I'm seeing this patient by the request  of:  [name], [name], NP  CC: Multiple joint complaints  YEP:Dlagzrupcz  Tara [name] is a 64 y.o. female coming in with complaint of fell down last night. Clemens forward and whole R side has been effected. Shoulder hurts with movement, low back feels compressed after sitting, pain in R hip. Iced last night. Mental confusion for about 45 mins after incident, okay since then. Denies any headache, just feels tight overall.  Some pain in the right shoulder region.  Does have replacements bilaterally of the shoulders.  Patient is here because do not want to go to the emergency room where she felt that she would get significant amount of imaging.      Past Medical History:  Diagnosis Date   Allergy    Anemia    Arthritis    Hyperlipidemia    Insomnia    Mitral valve prolapse    PONV (postoperative nausea and vomiting)    Past Surgical History:  Procedure Laterality Date   AUGMENTATION MAMMAPLASTY Bilateral    Dec 2019   BREAST BIOPSY Right    axillary node benign   EYE SURGERY  2010   Lasik for distance correction; use one contact in left eye for reading now   JOINT REPLACEMENT  2021; 2022; 2025   reverse shoulder on the right; anatomic replacement on the left then conversion to reverse replacement due to failed anatomic   KNEE SURGERY  1988   right   SHOULDER ARTHROSCOPY WITH LABRAL REPAIR Right 08/28/2012   Procedure: RIGHT SHOULDER ARTHROSCOPY WITH LABRAL DEBRIDEMENT, ASPIRATION AND DECOMPRESSION OF PARALABRAL CYST;  Surgeon: Franky CHRISTELLA Pointer, MD;  Location: [facility];  Service: Orthopedics;  Laterality: Right;   TOTAL SHOULDER ARTHROPLASTY Right 03/24/2020   Procedure: RIGHT REVERSE SHOULDER ARTHROPLASTY;  Surgeon: Pointer Franky, MD;  Location: [facility];  Service:  Orthopedics;  Laterality: Right;    TOTAL SHOULDER ARTHROPLASTY Left 02/23/2021   Procedure: TOTAL SHOULDER ARTHROPLASTY;  Surgeon: Pointer Franky, MD;  Location: [facility];  Service: Orthopedics;  Laterality: Left;   TOTAL SHOULDER REVISION Left 02/07/2023   Procedure: Excision Left shoulder nodule; Conversion of left anatomic shoulder arthroplasty to reverse shoulder arthroplasty;  Surgeon: Pointer Franky, MD;  Location: [facility];  Service: Orthopedics;  Laterality: Left;   Social History   Socioeconomic History   Marital status: Married    Spouse name: Not on file   Number of children: 1   Years of education: Not on file   Highest education level: Doctorate  Occupational History   Occupation: [employer]  Tobacco Use   Smoking status: Never    Passive exposure: Never   Smokeless tobacco: Never  Vaping Use   Vaping status: Never Used  Substance and Sexual Activity   Alcohol use: No   Drug use: No   Sexual activity: Yes    Birth control/protection: Post-menopausal  Other Topics Concern   Not on file  Social History Narrative   Tobacco use, amount per day now: 0   Past tobacco use, amount per day: 0   How many years did you use tobacco: 0   Alcohol use (drinks per week): 0   Diet: Heart Healthy/DASH Diet/ Lean and Clean type foods.   Do you drink/eat things with caffeine: 1  glass of Tea in the morning.   Marital status: Married                                 What year were you married? 1987   Do you live in a house, apartment, assisted living, condo, trailer, etc.? House   Is it one or more stories? Yes   How many persons live in your home? 2 ( Husband/ Self )   Do you have pets in your home?( please list) Not currently, Dog Deceased.    Current or past profession: PhD/RN   Do you exercise?    Yes                              Type and how often? 6 Days a week.   Do you have a living will? Yes   Do you have a DNR form?   No                               If not, do you want to  discuss one? Yes   Do you have signed POA/HPOA forms? Yes                       If so, please bring to you appointment   Social Drivers of Health   Tobacco Use: Low Risk (02/19/2024)   Patient History    Smoking Tobacco Use: Never    Smokeless Tobacco Use: Never    Passive Exposure: Never  Financial Resource Strain: Low Risk (05/15/2023)   Overall Financial Resource Strain (CARDIA)    Difficulty of Paying Living Expenses: Not hard at all  Food Insecurity: No Food Insecurity (05/15/2023)   Hunger Vital Sign    Worried About Running Out of Food in the Last Year: Never true    Ran Out of Food in the Last Year: Never true  Transportation Needs: No Transportation Needs (05/15/2023)   PRAPARE - Administrator, Civil Service (Medical): No    Lack of Transportation (Non-Medical): No  Physical Activity: Sufficiently Active (05/15/2023)   Exercise Vital Sign    Days of Exercise per Week: 6 days    Minutes of Exercise per Session: 30 min  Stress: Stress Concern Present (05/15/2023)   [employer] of Occupational Health - Occupational Stress Questionnaire    Feeling of Stress : To some extent  Social Connections: Socially Integrated (05/15/2023)   Social Connection and Isolation Panel    Frequency of Communication with Friends and Family: More than three times a week    Frequency of Social Gatherings with Friends and Family: Twice a week    Attends Religious Services: More than 4 times per year    Active Member of Clubs or Organizations: Yes    Attends Banker Meetings: More than 4 times per year    Marital Status: Married  Depression (PHQ2-9): Low Risk (05/16/2023)   Depression (PHQ2-9)    PHQ-2 Score: 0  Alcohol Screen: Not on file  Housing: Low Risk (05/15/2023)   Housing Stability Vital Sign    Unable to Pay for Housing in the Last Year: No    Number of Times Moved in the Last Year: 0    Homeless in the Last Year: No  Utilities: Not on file  Health Literacy:  Not on file   Allergies[1] Family History  Problem Relation Age of Onset   Stroke Mother    Heart disease Mother    Hyperlipidemia Mother    Heart disease Father    Diabetes Sister    Heart disease Sister    Hyperlipidemia Sister    Hypertension Sister    Varicose Veins Sister    Diabetes Sister    Hyperlipidemia Sister    Hypertension Sister    Hyperlipidemia Sister    Hypertension Sister    Heart attack Brother 3   Diabetes Brother    Heart disease Brother    Hyperlipidemia Brother    Hypertension Brother    Sudden death Neg Hx    Breast cancer Neg Hx    Colon cancer Neg Hx    Esophageal cancer Neg Hx    Rectal cancer Neg Hx    Stomach cancer Neg Hx     Current Outpatient Medications (Endocrine & Metabolic):    denosumab  (PROLIA ) 60 MG/ML SOSY injection, Inject 60 mg into the skin every 6 (six) months.  Current Facility-Administered Medications (Endocrine & Metabolic):    denosumab  (PROLIA ) injection 60 mg   denosumab  (PROLIA ) injection 60 mg  Current Outpatient Medications (Cardiovascular):    Chlorthalidone  12.5 MG TABS, Take 12.5 mg by mouth daily with breakfast.   rosuvastatin  (CRESTOR ) 10 MG tablet, Take 1 tablet (10 mg total) by mouth daily.  Current Outpatient Medications (Analgesics):    aspirin  EC 81 MG tablet, Take 1 tablet (81 mg total) by mouth daily. Swallow whole.  Current Outpatient Medications (Hematological):    Cyanocobalamin  (VITAMIN B 12) 500 MCG TABS, Take 1,000 mcg by mouth daily with lunch.  Current Outpatient Medications (Other):    AMBULATORY NON FORMULARY MEDICATION, Medication Name: Mindful Evening Cocoa Mix (Melatonin 3 mg, Theanine 5 mg, GABA 100 mg)   CALCIUM  CITRATE PO, Take 2,000 mg by mouth daily. Upto 2000 mg elemental calcium  daily   Efinaconazole  10 % SOLN, Apply 1 drop topically daily.   Fluocin-Hydroquinone -Tretinoin  (TRI-LUMA ) 0.01-4-0.05 % CREA, APPLY TO AFFECTD AREAS (DARK SPOTS) DAILY   MAGNESIUM GLYCINATE PO, Take 500  mg by mouth daily. Chelated, supplies 50 mg of magnesium   Multiple Vitamin (MULTIVITAMIN WITH MINERALS) TABS, Take 1 tablet by mouth daily.   Omega-3 Fatty Acids (FISH OIL ) 1200 MG CAPS, Take 1,200 mg by mouth daily. Provides 600 mg Omega-3 fatty acids   Psyllium Husk POWD, Take 1 Dose by mouth daily. 1 dose = 1 to 2 tablespoons   tretinoin  (RETIN-A ) 0.05 % cream, Apply topically at bedtime. (Patient taking differently: Apply 1 Application topically at bedtime.)   Vitamin D -Vitamin K (VITAMIN K2-VITAMIN D3 PO), Take by mouth daily.   Reviewed prior external information including notes and imaging from  primary care provider As well as notes that were available from care everywhere and other healthcare systems.  Past medical history, social, surgical and family history all reviewed in electronic medical record.  No pertanent information unless stated regarding to the chief complaint.   Review of Systems:  No headache, visual changes, nausea, vomiting, diarrhea, constipation, dizziness, abdominal pain, skin rash, fevers, chills, night sweats, weight loss, swollen lymph nodes,, joint swelling, chest pain, shortness of breath, mood changes. POSITIVE muscle aches, body aches  Objective  Blood pressure 104/60, pulse (!) 55, height 5' 10 (1.778 m), weight 126 lb (57.2 kg), last menstrual period 08/11/2012, SpO2 98%.   General: No apparent distress alert and oriented x3 mood  and affect normal, dressed appropriately.  HEENT: Pupils are equal, does have nystagmus with testing.  Some difficulty with serial sevens.  Patient does have bruising under the right eye only.  Accommodation is normal. Respiratory: Patient's speak in full sentences and does not appear short of breath  Cardiovascular: No lower extremity edema, non tender, no erythema  Bilateral shoulders do have some arthritic changes from the acromioclavicular joint.  Patient does have bruising in the anterior shoulder.  Both shoulder  replacements are noted and are in the right area with patient having near full range of motion of both.  Neurovascular intact distally.  Neck exam has some good range of motion.  Tightness noted in the parascapular area Patient right hand has some swelling over the third MCP with some bruising but very minimal overall.  Can make a fist without any significant difficulty and good grip strength noted.   Impression and Recommendations:    The above documentation has been reviewed and is accurate and complete Arthea CHRISTELLA Sharps, DO        [1]  Allergies Allergen Reactions   Codeine Nausea And Vomiting   Hydrocodone Nausea And Vomiting   Triple Antibiotic Pain Relief [Neomy-Bacit-Polymyx-Pramoxine] Other (See Comments)    Burned skin   Triple Antibiotic W/Hydrocortisone  [Bacitra-Neomycin-Polymyxin-Hc]     Burned skin    Contrast Media [Iodinated Contrast Media] Hives    After CT scan with contrast, pt had MRI without contrast. Pt informed MRI Tech that she had a rash on her legs. MRI Tech spoke with Radiologist after MRI was completed. Per Rad, since patient was here x 30 minutes after CT contrast and was doing fine, with rash fading, pt was allowed to leave facility. Alert and oriented. jmh   "

## 2024-02-28 NOTE — Assessment & Plan Note (Signed)
 Referring patient back to her cardiologist secondary to potential cardiac event secondary to fall.  Could be a mechanical fall but even though that it was a witnessed fall nobody says there was a specific tripping occurring.

## 2024-02-28 NOTE — Telephone Encounter (Signed)
 Patient called to see if we had an opening today. Patient fell last night and states it was a significant fall and that everything head to toe was affected. FYI/Please advise.

## 2024-02-28 NOTE — Patient Instructions (Addendum)
 Cardiology referral Xray today Arnica lotion for the bruises Stop fish oil  Choline 500mg  daily CoQ10 400mg  daily See you again in next Wednesday or Thursday 7:30am

## 2024-02-28 NOTE — Assessment & Plan Note (Signed)
 No sign of midline tenderness.  Full strength noted of the upper extremities.  Encouraged patients with potential x-rays but patient declined this but will get one of the right shoulder secondary to the swelling on the anterior aspect.

## 2024-02-28 NOTE — Assessment & Plan Note (Signed)
 Following within the last 24 hours.  Patient does unfortunately have what appears to be a moderate concussion.  Seems to not have any significant difficulty at the moment but did not do well with testing.  Patient feels like her memory is fine.  This was a witnessed fall.  Patient states that the witness did not state that she truly tripped.  Patient does not remember the first 45 minutes of after the fall.  Never though loss consciousness per patient.  Patient has no weakness of any of the extremities.  We discussed the differences of the concussion versus subdural and potential imaging such as a CT scan.  Patient has worked in healthcare and feels confident that she would like to decline that and continue with conservative therapy for the concussion.  Discussed with patient that I would like to see her within the week to make sure she is improving and given her the different signs and symptoms and when to seek medical attention.  Will get x-rays of the right shoulder replacement secondary to the effusion.  Patient does have mitral valve regurg noted on echocardiogram and I am concerned that there is a chance that the fall was secondary to an arrhythmia so encouraged her to follow-up with her cardiologist for potential monitor.  Patient is in agreement with the plan.  Discussed with patient having bruising topical anti-inflammatories.  Discussed about safety and making sure she had help at home which patient states she does.  Was able to drive here today on her own accord

## 2024-03-02 ENCOUNTER — Encounter: Payer: Self-pay | Admitting: Cardiovascular Disease

## 2024-03-03 NOTE — Progress Notes (Unsigned)
 "   Cardiology Clinic Note   Patient Name: Tara [name] Date of Encounter: 03/04/2024  Primary Care Provider:  Leonarda Roxan BROCKS, NP Primary Cardiologist:  Lonni Cash, MD  Patient Profile    Tara [name] 64 year old female presents to the clinic today for evaluation post fall.  Past Medical History    Past Medical History:  Diagnosis Date   Allergy    Anemia    Arthritis    Hyperlipidemia    Insomnia    Mitral valve prolapse    PONV (postoperative nausea and vomiting)    Past Surgical History:  Procedure Laterality Date   AUGMENTATION MAMMAPLASTY Bilateral    Dec 2019   BREAST BIOPSY Right    axillary node benign   EYE SURGERY  2010   Lasik for distance correction; use one contact in left eye for reading now   JOINT REPLACEMENT  2021; 2022; 2025   reverse shoulder on the right; anatomic replacement on the left then conversion to reverse replacement due to failed anatomic   KNEE SURGERY  1988   right   SHOULDER ARTHROSCOPY WITH LABRAL REPAIR Right 08/28/2012   Procedure: RIGHT SHOULDER ARTHROSCOPY WITH LABRAL DEBRIDEMENT, ASPIRATION AND DECOMPRESSION OF PARALABRAL CYST;  Surgeon: Franky CHRISTELLA Pointer, MD;  Location: [facility];  Service: Orthopedics;  Laterality: Right;   TOTAL SHOULDER ARTHROPLASTY Right 03/24/2020   Procedure: RIGHT REVERSE SHOULDER ARTHROPLASTY;  Surgeon: Pointer Franky, MD;  Location: [facility];  Service: Orthopedics;  Laterality: Right;    TOTAL SHOULDER ARTHROPLASTY Left 02/23/2021   Procedure: TOTAL SHOULDER ARTHROPLASTY;  Surgeon: Pointer Franky, MD;  Location: [facility];  Service: Orthopedics;  Laterality: Left;   TOTAL SHOULDER REVISION Left 02/07/2023   Procedure: Excision Left shoulder nodule; Conversion of left anatomic shoulder arthroplasty to reverse shoulder arthroplasty;  Surgeon: Pointer Franky, MD;  Location: [facility];  Service: Orthopedics;  Laterality: Left;    Allergies  Allergies[1]  History of Present Illness     GERALD HONEA has a PMH of anemia, arthritis, hyperlipidemia, insomnia, mitral valve prolapse, and postoperative nausea and vomiting.  She was seen as a cardiac new patient on 10/23 to evaluate her mitral valve disease.  Echocardiogram 2012 showed normal LV function and mild-moderate mitral valve regurgitation.  She was lost to follow-up from 2012 until 2023.  She was seen in follow-up 10/23 and noted severe dyspnea on exertion.  She denied chest pain.  She had edema and both ankles at the end of the day.  Echocardiogram was ordered in 1/24 which showed an LVEF of 60 to 65%, normal RV function, moderate mitral valve regurgitation.  However full estimate over the degree of severity of mitral valve was unable to be gauged.  She had coronary CTA 1/24 which showed minimal plaque in her LAD and RCA.  Her coronary calcium  score was 0.  She was started on statin and aspirin  therapy.  She was seen in the office by Dr. Cash 2/24 and remained dyspneic.  She was offered TEE which was delayed due to GI workup.  She underwent upper endoscopy 4/24 and was found to have mucosal changes with longitudinal furrows and no strictures.  She underwent esophageal dilation.  She was seen by Dr. Cash again on 8/24.  She reported feeling much better.  TEE was deferred.  She underwent echocardiogram 5/25 and was noted to have an LVEF of 60-65%, mild-moderate mitral regurgitation.  Finally, she was seen in follow-up by Dr. Cash 5/25.  During that  time she was doing well.  She denied chest pain, dyspnea, palpitations, lower extremity swelling, near-syncope and syncope.  Her blood pressure was 100/62.  Her pulse was 46.  She was seen and evaluated by her PCP on 02/28/2024.  She had suffered a fall at home which left her concussed and with contusions on her right side.  It was felt that her fall may have been mechanical in nature.  However, there was concern for cardiac arrhythmia contributing to event.  She was referred  to cardiology for further evaluation.  She presents to the clinic today for evaluation and states she had a fall while walking with a friend.  She was concussed as a result and does not have any memory of the fall.  She was seen and evaluated by her PCP.  She denies any prior significant palpitations.  She does note previous mechanical falls.  We reviewed her previous cardiac history.  I will order magnesium, lipids, CBC, and CMP.  I will also order a 30-day cardiac event monitor.  We will plan follow-up in 10 to 12 weeks.  Today she denies chest pain, shortness of breath, lower extremity edema, fatigue,  melena, hematuria, hemoptysis, diaphoresis, weakness, presyncope, syncope, orthopnea, and PND.   Home Medications    Prior to Admission medications  Medication Sig Start Date End Date Taking? Authorizing Provider  AMBULATORY NON FORMULARY MEDICATION Medication Name: Mindful Evening Cocoa Mix (Melatonin 3 mg, Theanine 5 mg, GABA 100 mg) 09/05/21   [name], [name], NP  aspirin  EC 81 MG tablet Take 1 tablet (81 mg total) by mouth daily. Swallow whole. 03/14/22   Verlin Lonni BIRCH, MD  CALCIUM  CITRATE PO Take 2,000 mg by mouth daily. Upto 2000 mg elemental calcium  daily    [provider]  Chlorthalidone  12.5 MG TABS Take 12.5 mg by mouth daily with breakfast. 01/09/24   Nida, Gebreselassie W, MD  Cyanocobalamin  (VITAMIN B 12) 500 MCG TABS Take 1,000 mcg by mouth daily with lunch.    [provider]  denosumab  (PROLIA ) 60 MG/ML SOSY injection Inject 60 mg into the skin every 6 (six) months. 12/20/23   Jegede, Olugbemiga E, MD  Efinaconazole  10 % SOLN Apply 1 drop topically daily. 06/05/23   Gershon Donnice SAUNDERS, DPM  Fluocin-Hydroquinone -Tretinoin  (TRI-LUMA ) 0.01-4-0.05 % CREA APPLY TO AFFECTD AREAS (DARK SPOTS) DAILY    [provider]  MAGNESIUM GLYCINATE PO Take 500 mg by mouth daily. Chelated, supplies 50 mg of magnesium    [provider]  Multiple Vitamin  (MULTIVITAMIN WITH MINERALS) TABS Take 1 tablet by mouth daily.    [provider]  Omega-3 Fatty Acids (FISH OIL ) 1200 MG CAPS Take 1,200 mg by mouth daily. Provides 600 mg Omega-3 fatty acids    [provider]  Psyllium Husk POWD Take 1 Dose by mouth daily. 1 dose = 1 to 2 tablespoons    [provider]  rosuvastatin  (CRESTOR ) 10 MG tablet Take 1 tablet (10 mg total) by mouth daily. 07/04/23   Verlin Lonni BIRCH, MD  tretinoin  (RETIN-A ) 0.05 % cream Apply topically at bedtime. Patient taking differently: Apply 1 Application topically at bedtime. 12/19/22   Medina-Vargas, Monina C, NP  Vitamin D -Vitamin K (VITAMIN K2-VITAMIN D3 PO) Take by mouth daily.    [provider]    Family History    Family History  Problem Relation Age of Onset   Stroke Mother    Heart disease Mother    Hyperlipidemia Mother  Heart disease Father    Diabetes Sister    Heart disease Sister    Hyperlipidemia Sister    Hypertension Sister    Varicose Veins Sister    Diabetes Sister    Hyperlipidemia Sister    Hypertension Sister    Hyperlipidemia Sister    Hypertension Sister    Heart attack Brother 26   Diabetes Brother    Heart disease Brother    Hyperlipidemia Brother    Hypertension Brother    Sudden death Neg Hx    Breast cancer Neg Hx    Colon cancer Neg Hx    Esophageal cancer Neg Hx    Rectal cancer Neg Hx    Stomach cancer Neg Hx    She indicated that her mother is alive. She indicated that her father is deceased. She indicated that all of her three sisters are alive. She indicated that her brother is alive. She indicated that her son is alive. She indicated that the status of her neg hx is unknown.  Social History    Social History   Socioeconomic History   Marital status: Married    Spouse name: Not on file   Number of children: 1   Years of education: Not on file   Highest education level: Doctorate  Occupational History   Occupation: [employer]   Tobacco Use   Smoking status: Never    Passive exposure: Never   Smokeless tobacco: Never  Vaping Use   Vaping status: Never Used  Substance and Sexual Activity   Alcohol use: No   Drug use: No   Sexual activity: Yes    Birth control/protection: Post-menopausal  Other Topics Concern   Not on file  Social History Narrative   Tobacco use, amount per day now: 0   Past tobacco use, amount per day: 0   How many years did you use tobacco: 0   Alcohol use (drinks per week): 0   Diet: Heart Healthy/DASH Diet/ Lean and Clean type foods.   Do you drink/eat things with caffeine: 1 glass of Tea in the morning.   Marital status: Married                                 What year were you married? 1987   Do you live in a house, apartment, assisted living, condo, trailer, etc.? House   Is it one or more stories? Yes   How many persons live in your home? 2 ( Husband/ Self )   Do you have pets in your home?( please list) Not currently, Dog Deceased.    Current or past profession: PhD/RN   Do you exercise?    Yes                              Type and how often? 6 Days a week.   Do you have a living will? Yes   Do you have a DNR form?   No                               If not, do you want to discuss one? Yes   Do you have signed POA/HPOA forms? Yes                       If so,  please bring to you appointment   Social Drivers of Health   Tobacco Use: Low Risk (03/04/2024)   Patient History    Smoking Tobacco Use: Never    Smokeless Tobacco Use: Never    Passive Exposure: Never  Financial Resource Strain: Low Risk (05/15/2023)   Overall Financial Resource Strain (CARDIA)    Difficulty of Paying Living Expenses: Not hard at all  Food Insecurity: No Food Insecurity (05/15/2023)   Hunger Vital Sign    Worried About Running Out of Food in the Last Year: Never true    Ran Out of Food in the Last Year: Never true  Transportation Needs: No Transportation Needs (05/15/2023)   PRAPARE - Therapist, Art (Medical): No    Lack of Transportation (Non-Medical): No  Physical Activity: Sufficiently Active (05/15/2023)   Exercise Vital Sign    Days of Exercise per Week: 6 days    Minutes of Exercise per Session: 30 min  Stress: Stress Concern Present (05/15/2023)   [employer] of Occupational Health - Occupational Stress Questionnaire    Feeling of Stress : To some extent  Social Connections: Socially Integrated (05/15/2023)   Social Connection and Isolation Panel    Frequency of Communication with Friends and Family: More than three times a week    Frequency of Social Gatherings with Friends and Family: Twice a week    Attends Religious Services: More than 4 times per year    Active Member of [employer] or Organizations: Yes    Attends Banker Meetings: More than 4 times per year    Marital Status: Married  Catering Manager Violence: Not on file  Depression (PHQ2-9): Low Risk (05/16/2023)   Depression (PHQ2-9)    PHQ-2 Score: 0  Alcohol Screen: Not on file  Housing: Low Risk (05/15/2023)   Housing Stability Vital Sign    Unable to Pay for Housing in the Last Year: No    Number of Times Moved in the Last Year: 0    Homeless in the Last Year: No  Utilities: Not on file  Health Literacy: Not on file     Review of Systems    General:  No chills, fever, night sweats or weight changes.  Cardiovascular:  No chest pain, dyspnea on exertion, edema, orthopnea, palpitations, paroxysmal nocturnal dyspnea. Dermatological: No rash, lesions/masses Respiratory: No cough, dyspnea Urologic: No hematuria, dysuria Abdominal:   No nausea, vomiting, diarrhea, bright red blood per rectum, melena, or hematemesis Neurologic:  No visual changes, wkns, changes in mental status. All other systems reviewed and are otherwise negative except as noted above.  Physical Exam    VS:  BP 116/82 (BP Location: Left Arm, Patient Position: Sitting, Cuff Size: Small)   Pulse (!) 56    Ht 5' 10 (1.778 m)   Wt 126 lb 6.4 oz (57.3 kg)   LMP 08/11/2012   SpO2 100%   BMI 18.14 kg/m  , BMI Body mass index is 18.14 kg/m. GEN: Well nourished, well developed, in no acute distress. HEENT: normal. Neck: Supple, no JVD, carotid bruits, or masses. Cardiac: RRR, no murmurs, rubs, or gallops. No clubbing, cyanosis, edema.  Radials/DP/PT 2+ and equal bilaterally.  Respiratory:  Respirations regular and unlabored, clear to auscultation bilaterally. GI: Soft, nontender, nondistended, BS + x 4. MS: no deformity or atrophy. Skin: warm and dry, no rash. Neuro:  Strength and sensation are intact. Psych: Normal affect.  Accessory Clinical Findings    Recent Labs: 05/16/2023: TSH  1.38 01/06/2024: ALT 23; BUN 12; BUN 12; Creatinine, Ser 0.55; Creatinine, Ser 0.55; Hemoglobin 13.4; Platelets 182.0; Potassium 4.3; Potassium 4.3; Sodium 131; Sodium 131   Recent Lipid Panel    Component Value Date/Time   CHOL 156 05/16/2023 0953   TRIG 45 05/16/2023 0953   HDL 66 05/16/2023 0953   CHOLHDL 2.4 05/16/2023 0953   LDLCALC 77 05/16/2023 0953         ECG personally reviewed by me today- EKG Interpretation Date/Time:  Wednesday March 04 2024 08:42:27 EST Ventricular Rate:  56 PR Interval:  144 QRS Duration:  70 QT Interval:  442 QTC Calculation: 426 R Axis:   83  Text Interpretation: Sinus bradycardia Septal infarct (cited on or before 25-Aug-2012) When compared with ECG of 01-Feb-2023 14:13, No significant change was found Confirmed by Emelia Hazy [phone]) on 03/04/2024 8:45:00 AM   Echocardiogram 5-25  IMPRESSIONS     1. Left ventricular ejection fraction, by estimation, is 60 to 65%. Left  ventricular ejection fraction by 3D volume is 62 %. The left ventricle has  normal function. The left ventricle has no regional wall motion  abnormalities. Left ventricular diastolic   parameters were normal. The average left ventricular global longitudinal  strain is -19.3 %. The  global longitudinal strain is normal.   2. Right ventricular systolic function is normal. The right ventricular  size is normal.   3. The mitral valve is myxomatous. Mild to moderate mitral valve  regurgitation. No evidence of mitral stenosis. There is moderate  holosystolic prolapse of the middle scallop of the posterior leaflet of  the mitral valve.   4. The aortic valve is normal in structure. Aortic valve regurgitation is  not visualized. No aortic stenosis is present.   5. The inferior vena cava is normal in size with greater than 50%  respiratory variability, suggesting right atrial pressure of 3 mmHg.   Comparison(s): No significant change from prior study. Prior images  reviewed side by side.   FINDINGS   Left Ventricle: Left ventricular ejection fraction, by estimation, is 60  to 65%. Left ventricular ejection fraction by 3D volume is 62 %. The left  ventricle has normal function. The left ventricle has no regional wall  motion abnormalities. The average  left ventricular global longitudinal strain is -19.3 %. Strain was  performed and the global longitudinal strain is normal. The left  ventricular internal cavity size was normal in size. There is no left  ventricular hypertrophy. Left ventricular diastolic  parameters were normal.   Right Ventricle: The right ventricular size is normal. No increase in  right ventricular wall thickness. Right ventricular systolic function is  normal.   Left Atrium: Left atrial size was normal in size.   Right Atrium: Right atrial size was normal in size.   Pericardium: There is no evidence of pericardial effusion.   Mitral Valve: The mitral valve is myxomatous. There is moderate  holosystolic prolapse of the middle scallop of the posterior leaflet of  the mitral valve. There is moderate thickening of the mitral valve  leaflet(s). Mild to moderate mitral valve  regurgitation. No evidence of mitral valve stenosis.   Tricuspid Valve:  The tricuspid valve is normal in structure. Tricuspid  valve regurgitation is mild . No evidence of tricuspid stenosis.   Aortic Valve: The aortic valve is normal in structure. Aortic valve  regurgitation is not visualized. No aortic stenosis is present.   Pulmonic Valve: The pulmonic valve was normal in structure. Pulmonic  valve  regurgitation is not visualized. No evidence of pulmonic stenosis.   Aorta: The aortic root is normal in size and structure.   Venous: The inferior vena cava is normal in size with greater than 50%  respiratory variability, suggesting right atrial pressure of 3 mmHg.   IAS/Shunts: No atrial level shunt detected by color flow Doppler.       Assessment & Plan   1.  Pre syncope,Concern for arrhythmia-patient suffered a fall at home on 02/27/2024.  She was seen and evaluated by her PCP on 02/28/2024.  She was felt to have a mild concussion.  She deferred imaging.  She was encouraged to follow-up with cardiology for further evaluation. Order 30-day cardiac event monitor Order CBC, BMP, magnesium  Mitral valve regurgitation-no increased DOE or activity intolerance.  Noted to have mild-moderate mitral valve regurgitation on echocardiogram 5/25.  Coronary artery disease-no chest pain today.  Denies exertional chest discomfort.  Noted to have mild CAD on coronary CTA 1/24. Continue aspirin  and statin therapy Heart healthy low-sodium diet  Hyperlipidemia-LDL 77 on 05/16/2023. High-fiber diet Continue rosuvastatin  Repeat fasting lipids   Disposition: Follow-up with Dr. Verlin or me after testing.   Josefa HERO. Viola Kinnick NP-C     03/04/2024, 8:49 AM [facility] [address] [place], [place] [address] Office [phone]    Notice: This dictation was prepared with Dragon dictation along with smaller phrase technology. Any transcriptional errors that result from this process are unintentional and may not be corrected upon  review.   I spent 14 minutes examining this patient, reviewing medications, and using patient centered shared decision making involving their cardiac care.   I spent  20 minutes reviewing past medical history,  medications, and prior cardiac tests.     [1]  Allergies Allergen Reactions   Codeine Nausea And Vomiting   Hydrocodone Nausea And Vomiting   Triple Antibiotic Pain Relief [Neomy-Bacit-Polymyx-Pramoxine] Other (See Comments)    Burned skin   Triple Antibiotic W/Hydrocortisone  [Bacitra-Neomycin-Polymyxin-Hc]     Burned skin    Contrast Media [Iodinated Contrast Media] Hives    After CT scan with contrast, pt had MRI without contrast. Pt informed MRI Tech that she had a rash on her legs. MRI Tech spoke with Radiologist after MRI was completed. Per Rad, since patient was here x 30 minutes after CT contrast and was doing fine, with rash fading, pt was allowed to leave facility. Alert and oriented. jmh   "

## 2024-03-03 NOTE — Progress Notes (Unsigned)
 " [name] [address] [place] [address] Phone: [phone] Subjective:   Tara [name], am serving as a scribe for Dr. Arthea Claudene.  I'm seeing this patient by the request  of:  [name], [name], NP  CC: Concussion follow-up neck pain follow-up  YEP:Dlagzrupcz  02/28/2024 Referring patient back to her cardiologist secondary to potential cardiac event secondary to fall. Could be a mechanical fall but even though that it was a witnessed fall nobody says there was a specific tripping occurring.   No sign of midline tenderness.  Full strength noted of the upper extremities.  Encouraged patients with potential x-rays but patient declined this but will get one of the right shoulder secondary to the swelling on the anterior aspect.     Following within the last 24 hours.  Patient does unfortunately have what appears to be a moderate concussion.  Seems to not have any significant difficulty at the moment but did not do well with testing.  Patient feels like her memory is fine.  This was a witnessed fall.  Patient states that the witness did not state that she truly tripped.  Patient does not remember the first 45 minutes of after the fall.  Never though loss consciousness per patient.  Patient has no weakness of any of the extremities.  We discussed the differences of the concussion versus subdural and potential imaging such as a CT scan.  Patient has worked in healthcare and feels confident that she would like to decline that and continue with conservative therapy for the concussion.  Discussed with patient that I would like to see her within the week to make sure she is improving and given her the different signs and symptoms and when to seek medical attention.  Will get x-rays of the right shoulder replacement secondary to the effusion.      Update 03/05/2024 Tara [name] is a 64 y.o. female coming in with complaint of neck, shoulder, and concussion  f/u. Patient states that she is still having tenderness over R orbit.   Also has pain and swelling in R shoulder. Also notes some crepitus.   R side of lumbar spine is also a bit more painful.   Overall improved but still painful.   Patient is on monitor from cardiology.        Past Medical History:  Diagnosis Date   Allergy    Anemia    Arthritis    Hyperlipidemia    Insomnia    Mitral valve prolapse    PONV (postoperative nausea and vomiting)    Past Surgical History:  Procedure Laterality Date   AUGMENTATION MAMMAPLASTY Bilateral    Dec 2019   BREAST BIOPSY Right    axillary node benign   EYE SURGERY  2010   Lasik for distance correction; use one contact in left eye for reading now   JOINT REPLACEMENT  2021; 2022; 2025   reverse shoulder on the right; anatomic replacement on the left then conversion to reverse replacement due to failed anatomic   KNEE SURGERY  1988   right   SHOULDER ARTHROSCOPY WITH LABRAL REPAIR Right 08/28/2012   Procedure: RIGHT SHOULDER ARTHROSCOPY WITH LABRAL DEBRIDEMENT, ASPIRATION AND DECOMPRESSION OF PARALABRAL CYST;  Surgeon: Franky CHRISTELLA Pointer, MD;  Location: [facility];  Service: Orthopedics;  Laterality: Right;   TOTAL SHOULDER ARTHROPLASTY Right 03/24/2020   Procedure: RIGHT REVERSE SHOULDER ARTHROPLASTY;  Surgeon: Pointer Franky, MD;  Location: [facility];  Service:  Orthopedics;  Laterality: Right;    TOTAL SHOULDER ARTHROPLASTY Left 02/23/2021   Procedure: TOTAL SHOULDER ARTHROPLASTY;  Surgeon: Melita Drivers, MD;  Location: [facility];  Service: Orthopedics;  Laterality: Left;   TOTAL SHOULDER REVISION Left 02/07/2023   Procedure: Excision Left shoulder nodule; Conversion of left anatomic shoulder arthroplasty to reverse shoulder arthroplasty;  Surgeon: Melita Drivers, MD;  Location: [facility];  Service: Orthopedics;  Laterality: Left;   Social History   Socioeconomic History   Marital status: Married    Spouse name: Not on file   Number of  children: 1   Years of education: Not on file   Highest education level: Doctorate  Occupational History   Occupation: [employer]  Tobacco Use   Smoking status: Never    Passive exposure: Never   Smokeless tobacco: Never  Vaping Use   Vaping status: Never Used  Substance and Sexual Activity   Alcohol use: No   Drug use: No   Sexual activity: Yes    Birth control/protection: Post-menopausal  Other Topics Concern   Not on file  Social History Narrative   Tobacco use, amount per day now: 0   Past tobacco use, amount per day: 0   How many years did you use tobacco: 0   Alcohol use (drinks per week): 0   Diet: Heart Healthy/DASH Diet/ Lean and Clean type foods.   Do you drink/eat things with caffeine: 1 glass of Tea in the morning.   Marital status: Married                                 What year were you married? 1987   Do you live in a house, apartment, assisted living, condo, trailer, etc.? House   Is it one or more stories? Yes   How many persons live in your home? 2 ( Husband/ Self )   Do you have pets in your home?( please list) Not currently, Dog Deceased.    Current or past profession: PhD/RN   Do you exercise?    Yes                              Type and how often? 6 Days a week.   Do you have a living will? Yes   Do you have a DNR form?   No                               If not, do you want to discuss one? Yes   Do you have signed POA/HPOA forms? Yes                       If so, please bring to you appointment   Social Drivers of Health   Tobacco Use: Low Risk (03/04/2024)   Patient History    Smoking Tobacco Use: Never    Smokeless Tobacco Use: Never    Passive Exposure: Never  Financial Resource Strain: Low Risk (05/15/2023)   Overall Financial Resource Strain (CARDIA)    Difficulty of Paying Living Expenses: Not hard at all  Food Insecurity: No Food Insecurity (05/15/2023)   Hunger Vital Sign    Worried About Running Out of Food in the Last Year: Never true    Ran Out  of Food in the Last Year:  Never true  Transportation Needs: No Transportation Needs (05/15/2023)   PRAPARE - Administrator, Civil Service (Medical): No    Lack of Transportation (Non-Medical): No  Physical Activity: Sufficiently Active (05/15/2023)   Exercise Vital Sign    Days of Exercise per Week: 6 days    Minutes of Exercise per Session: 30 min  Stress: Stress Concern Present (05/15/2023)   [employer] of Occupational Health - Occupational Stress Questionnaire    Feeling of Stress : To some extent  Social Connections: Socially Integrated (05/15/2023)   Social Connection and Isolation Panel    Frequency of Communication with Friends and Family: More than three times a week    Frequency of Social Gatherings with Friends and Family: Twice a week    Attends Religious Services: More than 4 times per year    Active Member of Clubs or Organizations: Yes    Attends Banker Meetings: More than 4 times per year    Marital Status: Married  Depression (PHQ2-9): Low Risk (05/16/2023)   Depression (PHQ2-9)    PHQ-2 Score: 0  Alcohol Screen: Not on file  Housing: Low Risk (05/15/2023)   Housing Stability Vital Sign    Unable to Pay for Housing in the Last Year: No    Number of Times Moved in the Last Year: 0    Homeless in the Last Year: No  Utilities: Not on file  Health Literacy: Not on file   Allergies[1] Family History  Problem Relation Age of Onset   Stroke Mother    Heart disease Mother    Hyperlipidemia Mother    Heart disease Father    Diabetes Sister    Heart disease Sister    Hyperlipidemia Sister    Hypertension Sister    Varicose Veins Sister    Diabetes Sister    Hyperlipidemia Sister    Hypertension Sister    Hyperlipidemia Sister    Hypertension Sister    Heart attack Brother 39   Diabetes Brother    Heart disease Brother    Hyperlipidemia Brother    Hypertension Brother    Sudden death Neg Hx    Breast cancer Neg Hx    Colon cancer  Neg Hx    Esophageal cancer Neg Hx    Rectal cancer Neg Hx    Stomach cancer Neg Hx     Current Outpatient Medications (Endocrine & Metabolic):    denosumab  (PROLIA ) 60 MG/ML SOSY injection, Inject 60 mg into the skin every 6 (six) months.  Current Facility-Administered Medications (Endocrine & Metabolic):    denosumab  (PROLIA ) injection 60 mg   denosumab  (PROLIA ) injection 60 mg  Current Outpatient Medications (Cardiovascular):    Chlorthalidone  12.5 MG TABS, Take 12.5 mg by mouth daily with breakfast.   rosuvastatin  (CRESTOR ) 10 MG tablet, Take 1 tablet (10 mg total) by mouth daily.  Current Outpatient Medications (Analgesics):    aspirin  EC 81 MG tablet, Take 1 tablet (81 mg total) by mouth daily. Swallow whole.  Current Outpatient Medications (Hematological):    Cyanocobalamin  (VITAMIN B 12) 500 MCG TABS, Take 1,000 mcg by mouth daily with lunch.  Current Outpatient Medications (Other):    AMBULATORY NON FORMULARY MEDICATION, Medication Name: Mindful Evening Cocoa Mix (Melatonin 3 mg, Theanine 5 mg, GABA 100 mg)   CALCIUM  CITRATE PO, Take 2,000 mg by mouth daily. Upto 2000 mg elemental calcium  daily   Efinaconazole  10 % SOLN, Apply 1 drop topically daily.   Fluocin-Hydroquinone -Tretinoin  (TRI-LUMA ) 0.01-4-0.05 %  CREA, APPLY TO AFFECTD AREAS (DARK SPOTS) DAILY   MAGNESIUM GLYCINATE PO, Take 500 mg by mouth daily. Chelated, supplies 50 mg of magnesium   Multiple Vitamin (MULTIVITAMIN WITH MINERALS) TABS, Take 1 tablet by mouth daily.   Omega-3 Fatty Acids (FISH OIL ) 1200 MG CAPS, Take 1,200 mg by mouth daily. Provides 600 mg Omega-3 fatty acids   Psyllium Husk POWD, Take 1 Dose by mouth daily. 1 dose = 1 to 2 tablespoons   tretinoin  (RETIN-A ) 0.05 % cream, Apply topically at bedtime. (Patient taking differently: Apply 1 Application topically at bedtime.)   Vitamin D -Vitamin K (VITAMIN K2-VITAMIN D3 PO), Take by mouth daily.   Reviewed prior external information including notes  and imaging from  primary care provider As well as notes that were available from care everywhere and other healthcare systems.  Past medical history, social, surgical and family history all reviewed in electronic medical record.  No pertanent information unless stated regarding to the chief complaint.   Review of Systems:  No , visual changes, nausea, vomiting, diarrhea, constipation, dizziness, abdominal pain, skin rash, fevers, chills, night sweats, weight loss, swollen lymph nodes,  joint swelling, chest pain, shortness of breath, mood changes. POSITIVE muscle aches, headache, muscle fatigue and bodyaches  Objective  Blood pressure 116/88, height 5' 10 (1.778 m), weight 129 lb (58.5 kg), last menstrual period 08/11/2012.   General: No apparent distress alert and oriented x3 mood and affect normal, dressed appropriately.  HEENT: Pupils equal, does have some mild nystagmus noted though on concussion testing.  Patient does have bruising of the right side of the face that seems to be improving. Respiratory: Patient's speak in full sentences and does not appear short of breath  Cardiovascular: No lower extremity edema, non tender, no erythema  Neck exam shows he does have some loss lordosis noted.  Right shoulder improvement in range of motion from previous exam.  Patient is significantly less tender.  Still had significant difficulty though with serial sevens.  Mild nystagmus still noted of the eyes with testing.  Relatively good balance  Limited muscular skeletal ultrasound was performed and interpreted by CLAUDENE HUSSAR, M  Limited ultrasound does not show any significant hematoma or effusion of the joint space.  Questionable small avulsion fracture of bone noted of the acromion.    Impression and Recommendations:     The above documentation has been reviewed and is accurate and complete Hussar CHRISTELLA Claudene, DO       [1]  Allergies Allergen Reactions   Codeine Nausea And Vomiting    Hydrocodone Nausea And Vomiting   Triple Antibiotic Pain Relief [Neomy-Bacit-Polymyx-Pramoxine] Other (See Comments)    Burned skin   Triple Antibiotic W/Hydrocortisone  [Bacitra-Neomycin-Polymyxin-Hc]     Burned skin    Contrast Media [Iodinated Contrast Media] Hives    After CT scan with contrast, pt had MRI without contrast. Pt informed MRI Tech that she had a rash on her legs. MRI Tech spoke with Radiologist after MRI was completed. Per Rad, since patient was here x 30 minutes after CT contrast and was doing fine, with rash fading, pt was allowed to leave facility. Alert and oriented. jmh   "

## 2024-03-04 ENCOUNTER — Ambulatory Visit: Attending: General Practice | Admitting: General Practice

## 2024-03-04 ENCOUNTER — Ambulatory Visit

## 2024-03-04 ENCOUNTER — Encounter: Payer: Self-pay | Admitting: General Practice

## 2024-03-04 VITALS — BP 116/82 | HR 56 | Ht 70.0 in | Wt 126.4 lb

## 2024-03-04 DIAGNOSIS — I341 Nonrheumatic mitral (valve) prolapse: Secondary | ICD-10-CM | POA: Diagnosis not present

## 2024-03-04 DIAGNOSIS — I2510 Atherosclerotic heart disease of native coronary artery without angina pectoris: Secondary | ICD-10-CM | POA: Diagnosis not present

## 2024-03-04 DIAGNOSIS — R55 Syncope and collapse: Secondary | ICD-10-CM

## 2024-03-04 DIAGNOSIS — E785 Hyperlipidemia, unspecified: Secondary | ICD-10-CM | POA: Diagnosis not present

## 2024-03-04 NOTE — Progress Notes (Unsigned)
 [employer] 30 day event monitor serial # [id number] applied to patient. Arrangements made with Debby Metro and W[employer]ell Server with [employer] for patient to remove monitor 03/11/24 and reapply 03/21/24, while she is in [place].  She will be given a 10 day extension, so her total enrollment period will be 03/04/24-04/13/24.  Dr. Verlin to read.

## 2024-03-04 NOTE — Patient Instructions (Signed)
 Medication Instructions:  NO CHANGES *If you need a refill on your cardiac medications before your next appointment, please call your pharmacy*  Lab Work: FASTING LIPID PANEL, MAGNESIUM, CBC, CMET TODAY If you have labs (blood work) drawn today and your tests are completely normal, you will receive your results only by: MyChart Message (if you have MyChart) OR A paper copy in the mail If you have any lab test that is abnormal or we need to change your treatment, we will call you to review the results.  Testing/Procedures: [employer] Cardiac Event Monitor Instructions  Your physician has requested you wear your cardiac event monitor for 30 days, (1-30). [employer] may call or text to confirm a shipping address. The monitor will be sent to a land address via UPS. [employer] will not ship a monitor to a PO BOX. It typically takes 3-5 days to receive your monitor after it has been enrolled. [employer] will assist with USPS tracking if your package is delayed. The telephone number for [employer] is [phone]. Once you have received your monitor, please review the enclosed instructions. Instruction tutorials can also be viewed under help and settings on the enclosed cell phone. Your monitor has already been registered assigning a specific monitor serial # to you.  Billing and Self Pay Discount Information  [employer] has been provided the insurance information we had on file for you.  If your insurance has been updated, please call [employer] at [phone] to provide them with your updated insurance information.   [employer] offers a discounted Self Pay option for patients who have insurance that does not cover their cardiac event monitor or patients without insurance.  The discounted cost of a Self Pay Cardiac Event Monitor would be $225.00 , if the patient contacts [employer] at [phone] within 7 days of applying the monitor to make payment arrangements.  If the patient does not  contact [employer] within 7 days of applying the monitor, the cost of the cardiac event monitor will be $350.00.  Applying the monitor  Remove cell phone from case and turn it on. The cell phone works as it consultant and needs to be within [facility] of you at all times. The cell phone will need to be charged on a daily basis. We recommend you plug the cell phone into the enclosed charger at your bedside table every night.  Monitor batteries: You will receive two monitor batteries labelled #1 and #2. These are your recorders. Plug battery #2 onto the second connection on the enclosed charger. Keep one battery on the charger at all times. This will keep the monitor battery deactivated. It will also keep it fully charged for when you need to switch your monitor batteries. A small light will be blinking on the battery emblem when it is charging. The light on the battery emblem will remain on when the battery is fully charged.  Open package of a Monitor strip. Insert battery #1 into black hood on strip and gently squeeze monitor battery onto connection as indicated in instruction booklet. Set aside while preparing skin.  Choose location for your strip, vertical or horizontal, as indicated in the instruction booklet. Shave to remove all hair from location. There cannot be any lotions, oils, powders, or colognes on skin where monitor is to be applied. Wipe skin clean with enclosed Saline wipe. Dry skin completely.  Peel paper labeled #1 off the back of the Monitor strip exposing the adhesive. Place the monitor on the chest in the vertical or horizontal position  shown in the instruction booklet. One arrow on the monitor strip must be pointing upward. Carefully remove paper labeled #2, attaching remainder of strip to your skin. Try not to create any folds or wrinkles in the strip as you apply it.  Firmly press and release the circle in the center of the monitor battery. You will hear a small beep.  This is turning the monitor battery on. The heart emblem on the monitor battery will light up every 5 seconds if the monitor battery in turned on and connected to the patient securely. Do not push and hold the circle down as this turns the monitor battery off. The cell phone will locate the monitor battery. A screen will appear on the cell phone checking the connection of your monitor strip. This may read poor connection initially but change to good connection within the next minute. Once your monitor accepts the connection you will hear a series of 3 beeps followed by a climbing crescendo of beeps. A screen will appear on the cell phone showing the two monitor strip placement options. Touch the picture that demonstrates where you applied the monitor strip.  Your monitor strip and battery are waterproof. You are able to shower, bathe, or swim with the monitor on. They just ask you do not submerge deeper than 3 feet underwater. We recommend removing the monitor if you are swimming in a lake, river, or ocean.  Your monitor battery will need to be switched to a fully charged monitor battery approximately once a week. The cell phone will alert you of an action which needs to be made.  On the cell phone, tap for details to reveal connection status, monitor battery status, and cell phone battery status. The green dots indicates your monitor is in good status. A red dot indicates there is something that needs your attention.  To record a symptom, click the circle on the monitor battery. In 30-60 seconds a list of symptoms will appear on the cell phone. Select your symptom and tap save. Your monitor will record a sustained or significant arrhythmia regardless of you clicking the button. Some patients do not feel the heart rhythm irregularities. [employer] will notify us  of any serious or critical events.  Refer to instruction booklet for instructions on switching batteries, changing strips, the Do  not disturb or Pause features, or any additional questions.  Call [employer] at [phone], to confirm your monitor is transmitting and record your baseline. They will answer any questions you may have regarding the monitor instructions at that time.  Returning the monitor to [employer]  Place all equipment back into blue box. Peel off strip of paper to expose adhesive and close box securely. There is a prepaid UPS shipping label on this box. Drop in a UPS drop box, or at a UPS facility like [employer]. You may also contact [employer] to arrange UPS to pick up monitor package at your home.   Follow-Up: At [facility], you and your health needs are our priority.  As part of our continuing mission to provide you with exceptional heart care, our providers are all part of one team.  This team includes your primary Cardiologist (physician) and Advanced Practice Providers or APPs (Physician Assistants and Nurse Practitioners) who all work together to provide you with the care you need, when you need it.  Your next appointment:   10-12 week(s)  Provider:   Lonni Cash, MD or Josefa Beauvais, NP   Other Instructions

## 2024-03-05 ENCOUNTER — Encounter: Payer: Self-pay | Admitting: Family Medicine

## 2024-03-05 ENCOUNTER — Other Ambulatory Visit: Payer: Self-pay

## 2024-03-05 ENCOUNTER — Ambulatory Visit: Payer: Self-pay | Admitting: General Practice

## 2024-03-05 ENCOUNTER — Ambulatory Visit (INDEPENDENT_AMBULATORY_CARE_PROVIDER_SITE_OTHER): Admitting: Family Medicine

## 2024-03-05 VITALS — BP 116/88 | Ht 70.0 in | Wt 129.0 lb

## 2024-03-05 DIAGNOSIS — S060X0D Concussion without loss of consciousness, subsequent encounter: Secondary | ICD-10-CM | POA: Diagnosis not present

## 2024-03-05 DIAGNOSIS — S060X0A Concussion without loss of consciousness, initial encounter: Secondary | ICD-10-CM | POA: Insufficient documentation

## 2024-03-05 DIAGNOSIS — M25511 Pain in right shoulder: Secondary | ICD-10-CM | POA: Diagnosis not present

## 2024-03-05 LAB — COMPREHENSIVE METABOLIC PANEL WITH GFR
ALT: 28 IU/L (ref 0–32)
AST: 36 IU/L (ref 0–40)
Albumin: 4.8 g/dL (ref 3.9–4.9)
Alkaline Phosphatase: 46 IU/L — ABNORMAL LOW (ref 49–135)
BUN/Creatinine Ratio: 27 (ref 12–28)
BUN: 17 mg/dL (ref 8–27)
Bilirubin Total: 0.6 mg/dL (ref 0.0–1.2)
CO2: 24 mmol/L (ref 20–29)
Calcium: 9.5 mg/dL (ref 8.7–10.3)
Chloride: 96 mmol/L (ref 96–106)
Creatinine, Ser: 0.63 mg/dL (ref 0.57–1.00)
Globulin, Total: 2.1 g/dL (ref 1.5–4.5)
Glucose: 84 mg/dL (ref 70–99)
Potassium: 4.1 mmol/L (ref 3.5–5.2)
Sodium: 135 mmol/L (ref 134–144)
Total Protein: 6.9 g/dL (ref 6.0–8.5)
eGFR: 100 mL/min/1.73

## 2024-03-05 LAB — LIPID PANEL
Chol/HDL Ratio: 1.8 ratio (ref 0.0–4.4)
Cholesterol, Total: 156 mg/dL (ref 100–199)
HDL: 87 mg/dL
LDL Chol Calc (NIH): 58 mg/dL (ref 0–99)
Triglycerides: 49 mg/dL (ref 0–149)
VLDL Cholesterol Cal: 11 mg/dL (ref 5–40)

## 2024-03-05 LAB — CBC
Hematocrit: 44 % (ref 34.0–46.6)
Hemoglobin: 14.7 g/dL (ref 11.1–15.9)
MCH: 35.8 pg — ABNORMAL HIGH (ref 26.6–33.0)
MCHC: 33.4 g/dL (ref 31.5–35.7)
MCV: 107 fL — ABNORMAL HIGH (ref 79–97)
Platelets: 209 x10E3/uL (ref 150–450)
RBC: 4.11 x10E6/uL (ref 3.77–5.28)
RDW: 11.4 % — ABNORMAL LOW (ref 11.7–15.4)
WBC: 3.8 x10E3/uL (ref 3.4–10.8)

## 2024-03-05 LAB — MAGNESIUM: Magnesium: 2.2 mg/dL (ref 1.6–2.3)

## 2024-03-05 NOTE — Assessment & Plan Note (Signed)
 Concussion with improvement noted.  Still difficulty though with serial sevens as well as patient having difficulty with higher reasoning.  Patient though wants to continue working at this point even though I do think it would actually speed up the process of her healing.  Patient is in agreement to monitor and will write us  if anything else changes.  Bruising of patient's face seems to be responding.  X-rays and ultrasound does show a mild potential avulsion fracture noted but I think patient will do well with conservative therapy and encourage patient to continue with range of motion exercises.  Follow-up again 2 weeks

## 2024-03-05 NOTE — Patient Instructions (Addendum)
 Can start 2g of fish oil  daily Continue other vits See us  again on 03/24/2024

## 2024-03-18 NOTE — Progress Notes (Unsigned)
 " [name] [address] [place] [address] Phone: [phone] Subjective:    I'm seeing this patient by the request  of:  [name], [name], NP  CC:   YEP:Dlagzrupcz  03/05/2024  Concussion with improvement noted.  Still difficulty though with serial sevens as well as patient having difficulty with higher reasoning.  Patient though wants to continue working at this point even though I do think it would actually speed up the process of her healing.  Patient is in agreement to monitor and will write us  if anything else changes.  Bruising of patient's face seems to be responding.  X-rays and ultrasound does show a mild potential avulsion fracture noted but I think patient will do well with conservative therapy and encourage patient to continue with range of motion exercises.  Follow-up again 2 weeks     Update 03/24/2024 Tara [name] is a 64 y.o. female coming in with complaint of R shoulder pain. Patient states        Past Medical History:  Diagnosis Date   Allergy    Anemia    Arthritis    Hyperlipidemia    Insomnia    Mitral valve prolapse    PONV (postoperative nausea and vomiting)    Past Surgical History:  Procedure Laterality Date   AUGMENTATION MAMMAPLASTY Bilateral    Dec 2019   BREAST BIOPSY Right    axillary node benign   EYE SURGERY  2010   Lasik for distance correction; use one contact in left eye for reading now   JOINT REPLACEMENT  2021; 2022; 2025   reverse shoulder on the right; anatomic replacement on the left then conversion to reverse replacement due to failed anatomic   KNEE SURGERY  1988   right   SHOULDER ARTHROSCOPY WITH LABRAL REPAIR Right 08/28/2012   Procedure: RIGHT SHOULDER ARTHROSCOPY WITH LABRAL DEBRIDEMENT, ASPIRATION AND DECOMPRESSION OF PARALABRAL CYST;  Surgeon: Franky CHRISTELLA Pointer, MD;  Location: [facility];  Service: Orthopedics;  Laterality: Right;   TOTAL SHOULDER ARTHROPLASTY Right 03/24/2020    Procedure: RIGHT REVERSE SHOULDER ARTHROPLASTY;  Surgeon: Pointer Franky, MD;  Location: [facility];  Service: Orthopedics;  Laterality: Right;    TOTAL SHOULDER ARTHROPLASTY Left 02/23/2021   Procedure: TOTAL SHOULDER ARTHROPLASTY;  Surgeon: Pointer Franky, MD;  Location: [facility];  Service: Orthopedics;  Laterality: Left;   TOTAL SHOULDER REVISION Left 02/07/2023   Procedure: Excision Left shoulder nodule; Conversion of left anatomic shoulder arthroplasty to reverse shoulder arthroplasty;  Surgeon: Pointer Franky, MD;  Location: [facility];  Service: Orthopedics;  Laterality: Left;   Social History   Socioeconomic History   Marital status: Married    Spouse name: Not on file   Number of children: 1   Years of education: Not on file   Highest education level: Doctorate  Occupational History   Occupation: [employer]  Tobacco Use   Smoking status: Never    Passive exposure: Never   Smokeless tobacco: Never  Vaping Use   Vaping status: Never Used  Substance and Sexual Activity   Alcohol use: No   Drug use: No   Sexual activity: Yes    Birth control/protection: Post-menopausal  Other Topics Concern   Not on file  Social History Narrative   Tobacco use, amount per day now: 0   Past tobacco use, amount per day: 0   How many years did you use tobacco: 0   Alcohol use (drinks per week): 0  Diet: Heart Healthy/DASH Diet/ Lean and Clean type foods.   Do you drink/eat things with caffeine: 1 glass of Tea in the morning.   Marital status: Married                                 What year were you married? 1987   Do you live in a house, apartment, assisted living, condo, trailer, etc.? House   Is it one or more stories? Yes   How many persons live in your home? 2 ( Husband/ Self )   Do you have pets in your home?( please list) Not currently, Dog Deceased.    Current or past profession: PhD/RN   Do you exercise?    Yes                              Type and how often? 6 Days a week.   Do you have a  living will? Yes   Do you have a DNR form?   No                               If not, do you want to discuss one? Yes   Do you have signed POA/HPOA forms? Yes                       If so, please bring to you appointment   Social Drivers of Health   Tobacco Use: Low Risk (03/04/2024)   Patient History    Smoking Tobacco Use: Never    Smokeless Tobacco Use: Never    Passive Exposure: Never  Financial Resource Strain: Low Risk (05/15/2023)   Overall Financial Resource Strain (CARDIA)    Difficulty of Paying Living Expenses: Not hard at all  Food Insecurity: No Food Insecurity (05/15/2023)   Hunger Vital Sign    Worried About Running Out of Food in the Last Year: Never true    Ran Out of Food in the Last Year: Never true  Transportation Needs: No Transportation Needs (05/15/2023)   PRAPARE - Administrator, Civil Service (Medical): No    Lack of Transportation (Non-Medical): No  Physical Activity: Sufficiently Active (05/15/2023)   Exercise Vital Sign    Days of Exercise per Week: 6 days    Minutes of Exercise per Session: 30 min  Stress: Stress Concern Present (05/15/2023)   [employer] of Occupational Health - Occupational Stress Questionnaire    Feeling of Stress : To some extent  Social Connections: Socially Integrated (05/15/2023)   Social Connection and Isolation Panel    Frequency of Communication with Friends and Family: More than three times a week    Frequency of Social Gatherings with Friends and Family: Twice a week    Attends Religious Services: More than 4 times per year    Active Member of Clubs or Organizations: Yes    Attends Banker Meetings: More than 4 times per year    Marital Status: Married  Depression (PHQ2-9): Low Risk (05/16/2023)   Depression (PHQ2-9)    PHQ-2 Score: 0  Alcohol Screen: Not on file  Housing: Low Risk (05/15/2023)   Housing Stability Vital Sign    Unable to Pay for Housing in the Last Year: No    Number of Times  Moved  in the Last Year: 0    Homeless in the Last Year: No  Utilities: Not on file  Health Literacy: Not on file   Allergies[1] Family History  Problem Relation Age of Onset   Stroke Mother    Heart disease Mother    Hyperlipidemia Mother    Heart disease Father    Diabetes Sister    Heart disease Sister    Hyperlipidemia Sister    Hypertension Sister    Varicose Veins Sister    Diabetes Sister    Hyperlipidemia Sister    Hypertension Sister    Hyperlipidemia Sister    Hypertension Sister    Heart attack Brother 40   Diabetes Brother    Heart disease Brother    Hyperlipidemia Brother    Hypertension Brother    Sudden death Neg Hx    Breast cancer Neg Hx    Colon cancer Neg Hx    Esophageal cancer Neg Hx    Rectal cancer Neg Hx    Stomach cancer Neg Hx     Current Outpatient Medications (Endocrine & Metabolic):    denosumab  (PROLIA ) 60 MG/ML SOSY injection, Inject 60 mg into the skin every 6 (six) months.  Current Facility-Administered Medications (Endocrine & Metabolic):    denosumab  (PROLIA ) injection 60 mg   denosumab  (PROLIA ) injection 60 mg  Current Outpatient Medications (Cardiovascular):    Chlorthalidone  12.5 MG TABS, Take 12.5 mg by mouth daily with breakfast.   rosuvastatin  (CRESTOR ) 10 MG tablet, Take 1 tablet (10 mg total) by mouth daily.  Current Outpatient Medications (Analgesics):    aspirin  EC 81 MG tablet, Take 1 tablet (81 mg total) by mouth daily. Swallow whole.  Current Outpatient Medications (Hematological):    Cyanocobalamin  (VITAMIN B 12) 500 MCG TABS, Take 1,000 mcg by mouth daily with lunch.  Current Outpatient Medications (Other):    AMBULATORY NON FORMULARY MEDICATION, Medication Name: Mindful Evening Cocoa Mix (Melatonin 3 mg, Theanine 5 mg, GABA 100 mg)   CALCIUM  CITRATE PO, Take 2,000 mg by mouth daily. Upto 2000 mg elemental calcium  daily   Efinaconazole  10 % SOLN, Apply 1 drop topically daily.   Fluocin-Hydroquinone -Tretinoin   (TRI-LUMA ) 0.01-4-0.05 % CREA, APPLY TO AFFECTD AREAS (DARK SPOTS) DAILY   MAGNESIUM GLYCINATE PO, Take 500 mg by mouth daily. Chelated, supplies 50 mg of magnesium   Multiple Vitamin (MULTIVITAMIN WITH MINERALS) TABS, Take 1 tablet by mouth daily.   Omega-3 Fatty Acids (FISH OIL ) 1200 MG CAPS, Take 1,200 mg by mouth daily. Provides 600 mg Omega-3 fatty acids   Psyllium Husk POWD, Take 1 Dose by mouth daily. 1 dose = 1 to 2 tablespoons   tretinoin  (RETIN-A ) 0.05 % cream, Apply topically at bedtime. (Patient taking differently: Apply 1 Application topically at bedtime.)   Vitamin D -Vitamin K (VITAMIN K2-VITAMIN D3 PO), Take by mouth daily.   Reviewed prior external information including notes and imaging from  primary care provider As well as notes that were available from care everywhere and other healthcare systems.  Past medical history, social, surgical and family history all reviewed in electronic medical record.  No pertanent information unless stated regarding to the chief complaint.   Review of Systems:  No headache, visual changes, nausea, vomiting, diarrhea, constipation, dizziness, abdominal pain, skin rash, fevers, chills, night sweats, weight loss, swollen lymph nodes, body aches, joint swelling, chest pain, shortness of breath, mood changes. POSITIVE muscle aches  Objective  Last menstrual period 08/11/2012.   General: No apparent distress alert and oriented x3  mood and affect normal, dressed appropriately.  HEENT: Pupils equal, extraocular movements intact  Respiratory: Patient's speak in full sentences and does not appear short of breath  Cardiovascular: No lower extremity edema, non tender, no erythema      Impression and Recommendations:           [1]  Allergies Allergen Reactions   Codeine Nausea And Vomiting   Hydrocodone Nausea And Vomiting   Triple Antibiotic Pain Relief [Neomy-Bacit-Polymyx-Pramoxine] Other (See Comments)    Burned skin   Triple  Antibiotic W/Hydrocortisone  [Bacitra-Neomycin-Polymyxin-Hc]     Burned skin    Contrast Media [Iodinated Contrast Media] Hives    After CT scan with contrast, pt had MRI without contrast. Pt informed MRI Tech that she had a rash on her legs. MRI Tech spoke with Radiologist after MRI was completed. Per Rad, since patient was here x 30 minutes after CT contrast and was doing fine, with rash fading, pt was allowed to leave facility. Alert and oriented. jmh   "

## 2024-03-24 ENCOUNTER — Ambulatory Visit: Admitting: Family Medicine

## 2024-03-24 ENCOUNTER — Encounter (HOSPITAL_BASED_OUTPATIENT_CLINIC_OR_DEPARTMENT_OTHER): Payer: Self-pay | Admitting: Physical Therapy

## 2024-03-27 NOTE — Progress Notes (Unsigned)
 " [name] [address] [place] [address] Phone: [phone] Subjective:    I'm seeing this patient by the request  of:  [name], [name], NP  CC:   YEP:Dlagzrupcz  03/05/2024  Concussion with improvement noted.  Still difficulty though with serial sevens as well as patient having difficulty with higher reasoning.  Patient though wants to continue working at this point even though I do think it would actually speed up the process of her healing.  Patient is in agreement to monitor and will write us  if anything else changes.  Bruising of patient's face seems to be responding.  X-rays and ultrasound does show a mild potential avulsion fracture noted but I think patient will do well with conservative therapy and encourage patient to continue with range of motion exercises.  Follow-up again 2 weeks     Update 03/24/2024 Tara [name] is a 64 y.o. female coming in with complaint of R shoulder pain. Patient states        Past Medical History:  Diagnosis Date   Allergy    Anemia    Arthritis    Hyperlipidemia    Insomnia    Mitral valve prolapse    PONV (postoperative nausea and vomiting)    Past Surgical History:  Procedure Laterality Date   AUGMENTATION MAMMAPLASTY Bilateral    Dec 2019   BREAST BIOPSY Right    axillary node benign   EYE SURGERY  2010   Lasik for distance correction; use one contact in left eye for reading now   JOINT REPLACEMENT  2021; 2022; 2025   reverse shoulder on the right; anatomic replacement on the left then conversion to reverse replacement due to failed anatomic   KNEE SURGERY  1988   right   SHOULDER ARTHROSCOPY WITH LABRAL REPAIR Right 08/28/2012   Procedure: RIGHT SHOULDER ARTHROSCOPY WITH LABRAL DEBRIDEMENT, ASPIRATION AND DECOMPRESSION OF PARALABRAL CYST;  Surgeon: Franky CHRISTELLA Pointer, MD;  Location: [facility];  Service: Orthopedics;  Laterality: Right;   TOTAL SHOULDER ARTHROPLASTY Right 03/24/2020    Procedure: RIGHT REVERSE SHOULDER ARTHROPLASTY;  Surgeon: Pointer Franky, MD;  Location: [facility];  Service: Orthopedics;  Laterality: Right;    TOTAL SHOULDER ARTHROPLASTY Left 02/23/2021   Procedure: TOTAL SHOULDER ARTHROPLASTY;  Surgeon: Pointer Franky, MD;  Location: [facility];  Service: Orthopedics;  Laterality: Left;   TOTAL SHOULDER REVISION Left 02/07/2023   Procedure: Excision Left shoulder nodule; Conversion of left anatomic shoulder arthroplasty to reverse shoulder arthroplasty;  Surgeon: Pointer Franky, MD;  Location: [facility];  Service: Orthopedics;  Laterality: Left;   Social History   Socioeconomic History   Marital status: Married    Spouse name: Not on file   Number of children: 1   Years of education: Not on file   Highest education level: Doctorate  Occupational History   Occupation: [employer]  Tobacco Use   Smoking status: Never    Passive exposure: Never   Smokeless tobacco: Never  Vaping Use   Vaping status: Never Used  Substance and Sexual Activity   Alcohol use: No   Drug use: No   Sexual activity: Yes    Birth control/protection: Post-menopausal  Other Topics Concern   Not on file  Social History Narrative   Tobacco use, amount per day now: 0   Past tobacco use, amount per day: 0   How many years did you use tobacco: 0   Alcohol use (drinks per week): 0  Diet: Heart Healthy/DASH Diet/ Lean and Clean type foods.   Do you drink/eat things with caffeine: 1 glass of Tea in the morning.   Marital status: Married                                 What year were you married? 1987   Do you live in a house, apartment, assisted living, condo, trailer, etc.? House   Is it one or more stories? Yes   How many persons live in your home? 2 ( Husband/ Self )   Do you have pets in your home?( please list) Not currently, Dog Deceased.    Current or past profession: PhD/RN   Do you exercise?    Yes                              Type and how often? 6 Days a week.   Do you have a  living will? Yes   Do you have a DNR form?   No                               If not, do you want to discuss one? Yes   Do you have signed POA/HPOA forms? Yes                       If so, please bring to you appointment   Social Drivers of Health   Tobacco Use: Low Risk (03/04/2024)   Patient History    Smoking Tobacco Use: Never    Smokeless Tobacco Use: Never    Passive Exposure: Never  Financial Resource Strain: Low Risk (05/15/2023)   Overall Financial Resource Strain (CARDIA)    Difficulty of Paying Living Expenses: Not hard at all  Food Insecurity: No Food Insecurity (05/15/2023)   Hunger Vital Sign    Worried About Running Out of Food in the Last Year: Never true    Ran Out of Food in the Last Year: Never true  Transportation Needs: No Transportation Needs (05/15/2023)   PRAPARE - Administrator, Civil Service (Medical): No    Lack of Transportation (Non-Medical): No  Physical Activity: Sufficiently Active (05/15/2023)   Exercise Vital Sign    Days of Exercise per Week: 6 days    Minutes of Exercise per Session: 30 min  Stress: Stress Concern Present (05/15/2023)   [employer] of Occupational Health - Occupational Stress Questionnaire    Feeling of Stress : To some extent  Social Connections: Socially Integrated (05/15/2023)   Social Connection and Isolation Panel    Frequency of Communication with Friends and Family: More than three times a week    Frequency of Social Gatherings with Friends and Family: Twice a week    Attends Religious Services: More than 4 times per year    Active Member of Clubs or Organizations: Yes    Attends Banker Meetings: More than 4 times per year    Marital Status: Married  Depression (PHQ2-9): Low Risk (05/16/2023)   Depression (PHQ2-9)    PHQ-2 Score: 0  Alcohol Screen: Not on file  Housing: Low Risk (05/15/2023)   Housing Stability Vital Sign    Unable to Pay for Housing in the Last Year: No    Number of Times  Moved  in the Last Year: 0    Homeless in the Last Year: No  Utilities: Not on file  Health Literacy: Not on file   [Allergies]  [Allergies] Allergen Reactions   Codeine Nausea And Vomiting   Hydrocodone Nausea And Vomiting   Triple Antibiotic Pain Relief [Neomy-Bacit-Polymyx-Pramoxine] Other (See Comments)    Burned skin   Triple Antibiotic W/Hydrocortisone  [Bacitra-Neomycin-Polymyxin-Hc]     Burned skin    Contrast Media [Iodinated Contrast Media] Hives    After CT scan with contrast, pt had MRI without contrast. Pt informed MRI Tech that she had a rash on her legs. MRI Tech spoke with Radiologist after MRI was completed. Per Rad, since patient was here x 30 minutes after CT contrast and was doing fine, with rash fading, pt was allowed to leave facility. Alert and oriented. jmh   Family History  Problem Relation Age of Onset   Stroke Mother    Heart disease Mother    Hyperlipidemia Mother    Heart disease Father    Diabetes Sister    Heart disease Sister    Hyperlipidemia Sister    Hypertension Sister    Varicose Veins Sister    Diabetes Sister    Hyperlipidemia Sister    Hypertension Sister    Hyperlipidemia Sister    Hypertension Sister    Heart attack Brother 78   Diabetes Brother    Heart disease Brother    Hyperlipidemia Brother    Hypertension Brother    Sudden death Neg Hx    Breast cancer Neg Hx    Colon cancer Neg Hx    Esophageal cancer Neg Hx    Rectal cancer Neg Hx    Stomach cancer Neg Hx     Current Outpatient Medications (Endocrine & Metabolic):    denosumab  (PROLIA ) 60 MG/ML SOSY injection, Inject 60 mg into the skin every 6 (six) months.  Current Facility-Administered Medications (Endocrine & Metabolic):    denosumab  (PROLIA ) injection 60 mg   denosumab  (PROLIA ) injection 60 mg  Current Outpatient Medications (Cardiovascular):    Chlorthalidone  12.5 MG TABS, Take 12.5 mg by mouth daily with breakfast.   rosuvastatin  (CRESTOR ) 10 MG tablet,  Take 1 tablet (10 mg total) by mouth daily.  Current Outpatient Medications (Analgesics):    aspirin  EC 81 MG tablet, Take 1 tablet (81 mg total) by mouth daily. Swallow whole.  Current Outpatient Medications (Hematological):    Cyanocobalamin  (VITAMIN B 12) 500 MCG TABS, Take 1,000 mcg by mouth daily with lunch.  Current Outpatient Medications (Other):    AMBULATORY NON FORMULARY MEDICATION, Medication Name: Mindful Evening Cocoa Mix (Melatonin 3 mg, Theanine 5 mg, GABA 100 mg)   CALCIUM  CITRATE PO, Take 2,000 mg by mouth daily. Upto 2000 mg elemental calcium  daily   Efinaconazole  10 % SOLN, Apply 1 drop topically daily.   Fluocin-Hydroquinone -Tretinoin  (TRI-LUMA ) 0.01-4-0.05 % CREA, APPLY TO AFFECTD AREAS (DARK SPOTS) DAILY   MAGNESIUM GLYCINATE PO, Take 500 mg by mouth daily. Chelated, supplies 50 mg of magnesium   Multiple Vitamin (MULTIVITAMIN WITH MINERALS) TABS, Take 1 tablet by mouth daily.   Omega-3 Fatty Acids (FISH OIL ) 1200 MG CAPS, Take 1,200 mg by mouth daily. Provides 600 mg Omega-3 fatty acids   Psyllium Husk POWD, Take 1 Dose by mouth daily. 1 dose = 1 to 2 tablespoons   tretinoin  (RETIN-A ) 0.05 % cream, Apply topically at bedtime. (Patient taking differently: Apply 1 Application topically at bedtime.)   Vitamin D -Vitamin K (VITAMIN K2-VITAMIN D3 PO),  Take by mouth daily.   Reviewed prior external information including notes and imaging from  primary care provider As well as notes that were available from care everywhere and other healthcare systems.  Past medical history, social, surgical and family history all reviewed in electronic medical record.  No pertanent information unless stated regarding to the chief complaint.   Review of Systems:  No headache, visual changes, nausea, vomiting, diarrhea, constipation, dizziness, abdominal pain, skin rash, fevers, chills, night sweats, weight loss, swollen lymph nodes, body aches, joint swelling, chest pain, shortness of  breath, mood changes. POSITIVE muscle aches  Objective  Last menstrual period 08/11/2012.   General: No apparent distress alert and oriented x3 mood and affect normal, dressed appropriately.  HEENT: Pupils equal, extraocular movements intact  Respiratory: Patient's speak in full sentences and does not appear short of breath  Cardiovascular: No lower extremity edema, non tender, no erythema      Impression and Recommendations:       "

## 2024-03-30 ENCOUNTER — Ambulatory Visit: Admitting: Family Medicine

## 2024-03-30 ENCOUNTER — Encounter (HOSPITAL_BASED_OUTPATIENT_CLINIC_OR_DEPARTMENT_OTHER): Admitting: Physical Therapy

## 2024-04-06 ENCOUNTER — Encounter (HOSPITAL_BASED_OUTPATIENT_CLINIC_OR_DEPARTMENT_OTHER): Admitting: Physical Therapy

## 2024-04-20 ENCOUNTER — Encounter (HOSPITAL_BASED_OUTPATIENT_CLINIC_OR_DEPARTMENT_OTHER): Admitting: Physical Therapy

## 2024-04-22 ENCOUNTER — Ambulatory Visit: Admitting: Psychology

## 2024-04-27 ENCOUNTER — Encounter (HOSPITAL_BASED_OUTPATIENT_CLINIC_OR_DEPARTMENT_OTHER): Admitting: Physical Therapy

## 2024-05-04 ENCOUNTER — Encounter (HOSPITAL_BASED_OUTPATIENT_CLINIC_OR_DEPARTMENT_OTHER): Admitting: Physical Therapy

## 2024-05-08 ENCOUNTER — Ambulatory Visit: Admitting: Podiatry

## 2024-05-11 ENCOUNTER — Encounter (HOSPITAL_BASED_OUTPATIENT_CLINIC_OR_DEPARTMENT_OTHER): Admitting: Physical Therapy

## 2024-05-18 ENCOUNTER — Encounter (HOSPITAL_BASED_OUTPATIENT_CLINIC_OR_DEPARTMENT_OTHER): Admitting: Physical Therapy

## 2024-05-21 ENCOUNTER — Ambulatory Visit: Payer: Self-pay | Admitting: Family

## 2024-06-12 ENCOUNTER — Ambulatory Visit: Admitting: General Practice

## 2024-07-09 ENCOUNTER — Ambulatory Visit: Admitting: "Endocrinology
